# Patient Record
Sex: Male | Born: 1950 | Race: White | Hispanic: No | Marital: Married | Smoking: Former smoker
Health system: Southern US, Community
[De-identification: ages and names within clinical notes are randomized; demographics above are authoritative.]

## PROBLEM LIST (undated history)

## (undated) DIAGNOSIS — J449 Chronic obstructive pulmonary disease, unspecified: Secondary | ICD-10-CM

## (undated) DIAGNOSIS — R042 Hemoptysis: Secondary | ICD-10-CM

## (undated) DIAGNOSIS — T4145XA Adverse effect of unspecified anesthetic, initial encounter: Secondary | ICD-10-CM

## (undated) DIAGNOSIS — I639 Cerebral infarction, unspecified: Secondary | ICD-10-CM

## (undated) DIAGNOSIS — H53459 Other localized visual field defect, unspecified eye: Secondary | ICD-10-CM

## (undated) DIAGNOSIS — F419 Anxiety disorder, unspecified: Secondary | ICD-10-CM

## (undated) DIAGNOSIS — D649 Anemia, unspecified: Secondary | ICD-10-CM

## (undated) DIAGNOSIS — F32A Depression, unspecified: Secondary | ICD-10-CM

## (undated) DIAGNOSIS — I119 Hypertensive heart disease without heart failure: Secondary | ICD-10-CM

## (undated) DIAGNOSIS — H409 Unspecified glaucoma: Secondary | ICD-10-CM

## (undated) DIAGNOSIS — F0390 Unspecified dementia without behavioral disturbance: Secondary | ICD-10-CM

## (undated) DIAGNOSIS — F329 Major depressive disorder, single episode, unspecified: Secondary | ICD-10-CM

## (undated) DIAGNOSIS — I340 Nonrheumatic mitral (valve) insufficiency: Secondary | ICD-10-CM

## (undated) DIAGNOSIS — J189 Pneumonia, unspecified organism: Secondary | ICD-10-CM

## (undated) DIAGNOSIS — R160 Hepatomegaly, not elsewhere classified: Secondary | ICD-10-CM

## (undated) DIAGNOSIS — T8859XA Other complications of anesthesia, initial encounter: Secondary | ICD-10-CM

## (undated) DIAGNOSIS — H33312 Horseshoe tear of retina without detachment, left eye: Secondary | ICD-10-CM

## (undated) DIAGNOSIS — I4892 Unspecified atrial flutter: Secondary | ICD-10-CM

## (undated) DIAGNOSIS — M1990 Unspecified osteoarthritis, unspecified site: Secondary | ICD-10-CM

## (undated) DIAGNOSIS — G4730 Sleep apnea, unspecified: Secondary | ICD-10-CM

## (undated) DIAGNOSIS — K219 Gastro-esophageal reflux disease without esophagitis: Secondary | ICD-10-CM

## (undated) HISTORY — DX: Hypertensive heart disease without heart failure: I11.9

## (undated) HISTORY — PX: LEG SURGERY: SHX1003

## (undated) HISTORY — DX: Hemoptysis: R04.2

## (undated) HISTORY — PX: TONSILLECTOMY: SUR1361

## (undated) HISTORY — DX: Nonrheumatic mitral (valve) insufficiency: I34.0

## (undated) HISTORY — DX: Unspecified atrial flutter: I48.92

## (undated) HISTORY — DX: Unspecified osteoarthritis, unspecified site: M19.90

## (undated) HISTORY — PX: VASECTOMY: SHX75

## (undated) HISTORY — PX: COLONOSCOPY: SHX174

---

## 2001-10-14 HISTORY — PX: CARDIAC CATHETERIZATION: SHX172

## 2001-11-13 ENCOUNTER — Ambulatory Visit (HOSPITAL_COMMUNITY): Admission: RE | Admit: 2001-11-13 | Discharge: 2001-11-13 | Payer: Self-pay | Admitting: Cardiology

## 2003-03-08 ENCOUNTER — Encounter: Admission: RE | Admit: 2003-03-08 | Discharge: 2003-03-31 | Payer: Self-pay | Admitting: Family Medicine

## 2003-03-15 ENCOUNTER — Encounter: Admission: RE | Admit: 2003-03-15 | Discharge: 2003-03-31 | Payer: Self-pay | Admitting: Family Medicine

## 2004-12-05 ENCOUNTER — Encounter (INDEPENDENT_AMBULATORY_CARE_PROVIDER_SITE_OTHER): Payer: Self-pay | Admitting: Specialist

## 2004-12-05 ENCOUNTER — Ambulatory Visit (HOSPITAL_COMMUNITY): Admission: RE | Admit: 2004-12-05 | Discharge: 2004-12-05 | Payer: Self-pay | Admitting: Gastroenterology

## 2007-05-18 ENCOUNTER — Ambulatory Visit (HOSPITAL_COMMUNITY): Admission: RE | Admit: 2007-05-18 | Discharge: 2007-05-18 | Payer: Self-pay | Admitting: Ophthalmology

## 2007-10-15 HISTORY — PX: EYE SURGERY: SHX253

## 2007-11-14 ENCOUNTER — Emergency Department (HOSPITAL_COMMUNITY): Admission: EM | Admit: 2007-11-14 | Discharge: 2007-11-14 | Payer: Self-pay | Admitting: Emergency Medicine

## 2007-11-16 ENCOUNTER — Ambulatory Visit (HOSPITAL_COMMUNITY): Admission: RE | Admit: 2007-11-16 | Discharge: 2007-11-16 | Payer: Self-pay | Admitting: Ophthalmology

## 2010-05-26 ENCOUNTER — Observation Stay (HOSPITAL_COMMUNITY): Admission: EM | Admit: 2010-05-26 | Discharge: 2010-05-27 | Payer: Self-pay | Admitting: Emergency Medicine

## 2010-06-29 ENCOUNTER — Ambulatory Visit (HOSPITAL_COMMUNITY): Admission: RE | Admit: 2010-06-29 | Discharge: 2010-06-30 | Payer: Self-pay | Admitting: Neurosurgery

## 2010-06-29 HISTORY — PX: NECK SURGERY: SHX720

## 2010-12-27 LAB — CBC
HCT: 40.9 % (ref 39.0–52.0)
MCH: 30.6 pg (ref 26.0–34.0)
RBC: 4.67 MIL/uL (ref 4.22–5.81)

## 2010-12-27 LAB — BASIC METABOLIC PANEL
BUN: 15 mg/dL (ref 6–23)
Calcium: 9.2 mg/dL (ref 8.4–10.5)
Creatinine, Ser: 0.9 mg/dL (ref 0.4–1.5)
GFR calc Af Amer: >60 mL/min (ref >60)
GFR calc non Af Amer: >60 mL/min (ref >60)
Glucose, Bld: 107 mg/dL — ABNORMAL HIGH (ref 70–99)
Potassium: 4.3 mEq/L (ref 3.5–5.1)
Sodium: 138 mEq/L (ref 135–145)

## 2010-12-28 LAB — POCT CARDIAC MARKERS: Troponin i, poc: <0.05 ng/mL (ref 0.00–0.09)

## 2010-12-28 LAB — DIFFERENTIAL
Basophils Absolute: 0 10*3/uL (ref 0.0–0.1)
Basophils Relative: 1 % (ref 0–1)
Lymphocytes Relative: 35 % (ref 12–46)
Neutrophils Relative %: 47 % (ref 43–77)

## 2010-12-28 LAB — CARDIAC PANEL(CRET KIN+CKTOT+MB+TROPI)
CK, MB: 1.3 ng/mL (ref 0.3–4.0)
CK, MB: 1.6 ng/mL (ref 0.3–4.0)
Relative Index: 0.9 (ref 0.0–2.5)
Total CK: 123 U/L (ref 7–232)
Total CK: 152 U/L (ref 7–232)
Total CK: 165 U/L (ref 7–232)
Troponin I: 0.01 ng/mL (ref 0.00–0.06)

## 2010-12-28 LAB — URINALYSIS, ROUTINE W REFLEX MICROSCOPIC
Bilirubin Urine: NEGATIVE
Hgb urine dipstick: NEGATIVE
Ketones, ur: NEGATIVE mg/dL
Nitrite: NEGATIVE
Protein, ur: NEGATIVE mg/dL
Specific Gravity, Urine: 1.003 — ABNORMAL LOW (ref 1.005–1.030)
Urobilinogen, UA: 0.2 mg/dL (ref 0.0–1.0)
pH: 6.5 (ref 5.0–8.0)

## 2010-12-28 LAB — LIPID PANEL
Total CHOL/HDL Ratio: 5.1 RATIO
VLDL: 41 mg/dL — ABNORMAL HIGH (ref 0–40)

## 2010-12-28 LAB — CBC
HCT: 40.6 % (ref 39.0–52.0)
MCV: 88.8 fL (ref 78.0–100.0)
Platelets: 190 10*3/uL (ref 150–400)
RBC: 4.57 MIL/uL (ref 4.22–5.81)
WBC: 7.2 10*3/uL (ref 4.0–10.5)

## 2010-12-28 LAB — CK TOTAL AND CKMB (NOT AT ARMC)
CK, MB: 2 ng/mL (ref 0.3–4.0)
Relative Index: 1 (ref 0.0–2.5)

## 2010-12-28 LAB — BASIC METABOLIC PANEL
GFR calc Af Amer: >60 mL/min (ref >60)
GFR calc non Af Amer: >60 mL/min (ref >60)
Glucose, Bld: 105 mg/dL — ABNORMAL HIGH (ref 70–99)
Potassium: 4.1 mEq/L (ref 3.5–5.1)

## 2010-12-28 LAB — TROPONIN I: Troponin I: 0.02 ng/mL (ref 0.00–0.06)

## 2011-02-26 NOTE — Op Note (Signed)
Juan Johnson, Juan Johnson                ACCOUNT NO.:  [id number]   MEDICAL RECORD NO.:  [id number]          PATIENT TYPE:  INP   LOCATION:  1826                         FACILITY:  [facility]   PHYSICIAN:  Alford Highland. Rankin, M.D.   DATE OF BIRTH:  08-07-1951   DATE OF PROCEDURE:  11/14/2007  DATE OF DISCHARGE:  11/14/2007                               OPERATIVE REPORT   PREOPERATIVE DIAGNOSES:  1. 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  2. Hypopyon, left eye.  3. History of cataract extraction with intraocular lens placement      August 2008 with postop longstanding recurrent chronic      inflammation, left eye.   POSTOPERATIVE DIAGNOSES:  1. 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  2. Hypopyon, left eye.  3. History of cataract extraction with intraocular lens placement      August 2008 with postop longstanding recurrent chronic      inflammation, left eye.   PROCEDURE:  1. Posterior vitrectomy left eye with membrane peel of vitreous      strands and inflammatory fibrous debris.  2. Injection of intravitreal antibiotic, ceftazidime 2.25 mg/0.1 mL      and vancomycin 1 mg/0.1 mL, volume of each is 0.1 mL.  3. Anterior chamber tap - diagnostic left eye.   SURGEON:  Alford Highland. Rankin, M.D.   ANESTHESIA:  General endotracheal anesthesia.   PROCEDURE IN DETAIL:  The patient is a 60 year old man who proceeded  today with a profound vision loss, level of light perception and found  to have hypopyon, severe pain, injected left eye, cloudy vision of rapid  onset.  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  The patient  understands the need for surgical intervention as well as to deliver  therapeutic definitive antibiotics.  He understands the risks of  anesthesia including rare occurrence of death, loss of the eye,  including but not limited to hemorrhage, infection, scarring, need for  further surgery, no change in vision, loss of  vision, progressive  disease despite intervention.  He understands that the prognosis is  based upon the type of infection if it is found to be present.  He  understands the risks of anesthesia including rare occurrence of death.  Appropriate signed consent was obtained.   The patient was taken to the operating room.  In the operating room  appropriate monitors followed by mild sedation.  General endotracheal  anesthesia was administered without difficulty.  Left periocular region  was sterilely prepped and draped in the usual sterile fashion.  Lid  speculum applied.  A 30 gauge needle with a 1 mL syringe was then used  to diagnostically tap the anterior chamber and the hypopyon.  This was  plated directly to chocolate, blood agar and sheep blood agar plates as  well as to anaerobic culture media.   At this time a Lewicky anterior chamber maintainer was then placed in  the anterior chamber via paracentesis incision so as to keep the  anterior chamber as clear as possible.  At this time superior trocars  for 25 gauge  vitrectomy was then applied.  Core vitrectomy was then  begun.  Notable findings were significant inflammatory vitreous debris  appearing like endophthalmitis.  The optic nerve did appear perfuse.  Retinal vasculature showed no dramatic signs of closure.  There were  scattered intraretinal hemorrhages in the mid periphery.   At this time peripheral vitreous was trimmed and shaped.  Care was taken  to avoid it getting even close to the retina.  The media cleared nicely.  At this time the superior trocar was removed under low infusion  pressures to allow room for the intraocular antibiotics.  The anterior  chamber maintainer was removed.  Then 0.1 mL of each of the antibiotics  mentioned above were then injected via the pars plana.  No complications  occurred.   At this time subconjunctival remnants of the antibiotics was applied.  Sterile patch and Fox shield applied.  The  patient was awakened from  anesthesia in good and stable condition.  He was taken to the PACU in  good and stable condition.      Alford Highland Rankin, M.D.  Electronically Signed     GAR/MEDQ  D:  11/14/2007  T:  11/15/2007  Job:  [id number]   cc:   Earnest Bailey, Dr

## 2011-02-26 NOTE — Op Note (Signed)
NAMERHYLAND, HINDERLITER                ACCOUNT NO.:  [id number]   MEDICAL RECORD NO.:  [id number]          PATIENT TYPE:  AMB   LOCATION:  SDS                          FACILITY:  [facility]   PHYSICIAN:  Alford Highland. Rankin, M.D.   DATE OF BIRTH:  1951/07/09   DATE OF PROCEDURE:  11/16/2007  DATE OF DISCHARGE:  11/16/2007                               OPERATIVE REPORT   PREOPERATIVE DIAGNOSES:  1. Endophthalmitis, suspected, persistent or recurrent, left eye.  2. Status post recent vitrectomy and instillation of intravitreal      antibiotics on November 14, 2007.   POSTOPERATIVE DIAGNOSES:  1. Endophthalmitis, suspected, persistent or recurrent, left eye.  2. Status post recent vitrectomy and instillation of intravitreal      antibiotics on November 14, 2007.   PROCEDURES:  1. Posterior vitrectomy, 25 gauge, left eye.  2. Removal of pupillary fibrin membrane with posterior synechialysis,      left eye.  3. Injection of intravitreal antibiotics, vancomycin, concentration 1      mg/0.1 mL, volume 0.1 mL, and ceftazidime 2.25 mg/0.1 mL, volume      0.1 mL, injected into the vitreous cavity, left eye.   SURGEON:  Alford Highland. Rankin, M.D.   ANESTHESIA:  Local retrobulbar with monitored anesthesia control.   INDICATIONS FOR PROCEDURE:  The patient is a 60 year old man who has  profound, persistent vision loss and reaccumulation of hypopyon and  pupillary fibrin membrane with significant anterior chamber debris,  persistent poor vision in the left eye on the basis of a recent  explosion of inflammation believed to be endophthalmitis, which has  worsened now to purulent endophthalmitis of the left eye.  This an  attempt to clean the vitreous cavity as well as to deliver therapeutic  intraocular antibiotics therapeutically.  The patient understands this  is an attempt to treat the inflammation and suspected endophthalmitis.  He understands this is an attempt to treat aggressively so as to  preserve the  best visual functioning possible.  He understands the risks  of anesthesia including the rare occurrence of death and losses to the  eye, including but not limited to hemorrhage, infection, scarring, need  for further surgery, no change in vision, loss of vision, and  progressive disease despite intervention.  Appropriate signed consent  was obtained.   The patient was taken to the operating room.  In the operating room  appropriate monitoring was followed by mild sedation.  Xylocaine 2% was  then injected retrobulbar for 5 mL with additional 5 mL laterally in the  fashion of modified Darel Hong.  The left periocular region was sterilely  prepped and draped in the usual ophthalmic fashion.  A lid speculum  applied.  A 25-gauge trocar placed in the vitreous cavity.  Superior  trocar was applied.  Initially a trocar was placed in posterior chamber  so as to remove the pupillary fibrin membrane.  Combination of  vitrectomy and forceps was then used to mobilize and remove the  membrane.   Anterior chamber paracentesis had been done initially.  This was plated  directly on the  plates for culture purposes.  Now the anterior chamber  trocar was removed and now the superior trocar was placed in the  vitreous cavity via the pars plana.  Core vitrectomy was begun again.  Excellent visualization was finally obtained and peripheral vitreous  skirt was trimmed even further than previously.  Excellent visualization  of the macula and the retinal vasculature and optic nerve were obtained,  and these appeared to be normal in the posterior segment.   At this time of the superior trocars were removed.  The infusion removed  under low infusion pressures.  This allowed placement of intravitreal  antibiotics, 0.1 mL of each of the vancomycin and ceftazidime.  Remnants  of these were placed subconjunctivally.  Subconjunctival Decadron  applied.  A sterile patch and a Fox shield applied.  The patient   tolerated the procedure without complication.      Alford Highland Rankin, M.D.  Electronically Signed     GAR/MEDQ  D:  11/16/2007  T:  11/17/2007  Job:  [id number]

## 2011-03-01 NOTE — Cardiovascular Report (Signed)
[place]  Patient:    Juan Johnson, Juan Johnson Visit Number: [id number] MRN: [id number]          Service Type: CAT Location: [facility] 2899 10 Attending Physician:  Norman Clay Dictated by:   Darden Palmer., M.D. Proc. Date: 11/13/01 Admit Date:  11/13/2001 Discharge Date: 11/13/2001   CC:         Delorse Lek, M.D.   Cardiac Catheterization  HISTORY OF PRESENT ILLNESS:  A 60 year old male who has had progressive exertional dyspnea and chest tightness consistent with angina.  Had a Cardiolite scan done earlier this year that was negative, but despite having the Cardiolite scan, he has had progressive symptoms of shortness of breath, pressure, and tightness with exertion.  COMPLICATIONS:  The patient tolerated the procedure well without complications.  Following the procedure, he had good hemostasis and peripheral pulses noted.  Catheter-induced spasm was present in the right coronary artery which was reversed with two doses of intracoronary nitroglycerin.  RESULTS:  Hemodynamic data: 1. Aorta post contrast 105/65. 2. LV post contrast 105/10.  Angiographic data: 1. Left ventriculogram performed in the 30 degree RAO projection:  The aortic    valve was normal, the mitral valve was normal, the left ventricle appears    normal in size.  The estimated ejection fraction was 60-65%.  Wall motion    is normal. 2. Coronary arteries rise and distribute normally.  No significant coronary    calcification is noted. 3. The left main coronary artery appears normal. 4. The left anterior descending has a large diagonal branch, is mildly    tortuous and contains only scattered irregularities. 5. The circumflex coronary artery has a series of three marginal branches.  It    is moderately tortuous but has no significant stenoses noted. 6. The right coronary artery is a dominant vessel.  There was no significant    focal obstructive  stenoses noted.  Catheter-induced spasm was present after    the initial injection which reversed with the two doses of intracoronary    nitroglycerin.  IMPRESSION: 1. Normal left ventricular function. 2. No significant obstructive coronary artery disease identified, minimally    irregularities were seen involving the left anterior descending coronary    artery.  RECOMMENDATIONS:  Continue medical treatment.  Treatment for small vessel disease and hypertension. Dictated by:   Darden Palmer., M.D. Attending Physician:  Norman Clay DD:  11/13/01 TD:  11/13/01 Job: [id number] [id number]

## 2011-03-01 NOTE — Op Note (Signed)
Juan Johnson, Juan Johnson                ACCOUNT NO.:  [id number]   MEDICAL RECORD NO.:  [id number]          PATIENT TYPE:  AMB   LOCATION:  ENDO                         FACILITY:  [facility]   PHYSICIAN:  Anselmo Rod, M.D.  DATE OF BIRTH:  May 29, 1951   DATE OF PROCEDURE:  12/05/2004  DATE OF DISCHARGE:                                 OPERATIVE REPORT   PROCEDURE PERFORMED:  Colonoscopy with snare polypectomy x1.   ENDOSCOPIST:  Anselmo Rod, M.D.   INSTRUMENT USED:  Olympus video colonoscope.   INDICATION FOR PROCEDURE:  [age]-year-old white male with a history of  occasional rectal bleeding, undergoing a screening colonoscopy to rule out  colon polyps, masses, etc.   PREPROCEDURE PREPARATION:  Informed consent was procured from the patient.  The patient was fasted for 8 hours prior to the procedure and prepped with a  bottle of magnesium citrate and a gallon of GoLYTELY the night prior to the  procedure.  A 10% missed rate of cancer in polyps was discussed with the  patient as well.   PREPROCEDURE PHYSICAL:  VITAL SIGNS:  The patient had stable vital signs.  NECK:  Neck supple.  CHEST:  Chest clear to auscultation.  S1 and S2 regular.  ABDOMEN:  Abdomen soft with normal bowel sounds.   DESCRIPTION OF THE PROCEDURE:  The patient was placed in the left lateral  decubitus position and sedated with 75 mg of Demerol and 5 mg of Versed in  slow incremental doses.  Once the patient was adequately sedated and  maintained on low-flow oxygen and continuous cardiac monitoring, the Olympus  video colonoscope was advanced from the rectum to the cecum.  The  appendiceal orifice and the ileocecal valve were visualized.  There was a  large amount of residual stool in the right colon and multiple washes were  done.  A small sessile polyp was noted in the proximal right colon and  another small sessile polyp was ablated in the mid right colon.  There were  a few sigmoid diverticula present.   Retroflexion in the rectum revealed  small internal hemorrhoids.  The patient tolerated the procedure well  without any immediate complications.   IMPRESSION:  1.  Small sessile polyp snared from proximal right colon.  2.  Small polyp ablated from mid right colon with the tip of the snare      polypectomy forceps.  3.  A few early sigmoid diverticula.  4.  Small internal hemorrhoids.   RECOMMENDATIONS:  1.  Await pathology results.  2.  Avoid nonsteroidals including aspirin for the next 2 weeks.  3.  High-fiber diet with liberal fluid intake.  4.  Outpatient followup as need arises in the future.  5.  A repeat colonoscopy will be done depending on pathology results.      JNM/MEDQ  D:  12/05/2004  T:  12/05/2004  Job:  [id number]   cc:   Marjory Lies, M.D.  [id number]  [place]  [place] [address]  Fax: [phone]

## 2011-07-04 LAB — ANAEROBIC CULTURE

## 2011-07-04 LAB — BASIC METABOLIC PANEL: GFR calc Af Amer: >60

## 2011-07-04 LAB — GRAM STAIN

## 2011-07-04 LAB — DIFFERENTIAL
Basophils Absolute: 0.1
Eosinophils Relative: 3
Monocytes Absolute: 0.7

## 2011-07-04 LAB — EYE CULTURE: Culture: NO GROWTH

## 2011-07-04 LAB — CBC
HCT: 41.2
MCV: 89.7
Platelets: 299
RBC: 4.59
RDW: 12.5

## 2011-07-05 LAB — BASIC METABOLIC PANEL
Calcium: 9.5
Creatinine, Ser: 0.97
GFR calc Af Amer: >60
GFR calc non Af Amer: >60
Sodium: 139

## 2011-07-05 LAB — CBC
Hemoglobin: 14.2
MCHC: 34.3
RBC: 4.57
WBC: 9

## 2011-07-05 LAB — LIPID PANEL
Cholesterol: 220 — ABNORMAL HIGH
HDL: 34 — ABNORMAL LOW
LDL Cholesterol: 153 — ABNORMAL HIGH
Total CHOL/HDL Ratio: 6.5
Triglycerides: 163 — ABNORMAL HIGH

## 2011-07-05 LAB — EYE CULTURE
Culture: NO GROWTH
Culture: NO GROWTH

## 2011-07-29 LAB — BASIC METABOLIC PANEL
Calcium: 9.4
GFR calc non Af Amer: >60
Glucose, Bld: 129 — ABNORMAL HIGH
Sodium: 138

## 2011-07-29 LAB — HEMOGLOBIN AND HEMATOCRIT, BLOOD: Hemoglobin: 14.3

## 2011-09-24 HISTORY — PX: SHOULDER SURGERY: SHX246

## 2012-10-05 ENCOUNTER — Other Ambulatory Visit: Payer: Self-pay | Admitting: Neurosurgery

## 2012-10-05 DIAGNOSIS — M549 Dorsalgia, unspecified: Secondary | ICD-10-CM

## 2012-10-05 DIAGNOSIS — M5410 Radiculopathy, site unspecified: Secondary | ICD-10-CM

## 2012-10-12 ENCOUNTER — Inpatient Hospital Stay
Admission: RE | Admit: 2012-10-12 | Discharge: 2012-10-12 | Disposition: A | Discharge status: Home / Self Care | Payer: Self-pay | Source: Ambulatory Visit | Attending: Neurosurgery | Admitting: Neurosurgery

## 2012-10-12 ENCOUNTER — Other Ambulatory Visit: Payer: Self-pay | Admitting: Neurosurgery

## 2012-10-12 ENCOUNTER — Ambulatory Visit
Admission: RE | Admit: 2012-10-12 | Discharge: 2012-10-12 | Disposition: A | Discharge status: Home / Self Care | Payer: BC Managed Care – PPO | Source: Ambulatory Visit | Attending: Neurosurgery | Admitting: Neurosurgery

## 2012-10-12 VITALS — BP 109/61 | HR 58 | Ht 69.0 in | Wt 215.0 lb

## 2012-10-12 DIAGNOSIS — M549 Dorsalgia, unspecified: Secondary | ICD-10-CM

## 2012-10-12 DIAGNOSIS — M5410 Radiculopathy, site unspecified: Secondary | ICD-10-CM

## 2012-10-12 MED ORDER — DIAZEPAM 5 MG PO TABS
10.0000 mg | ORAL_TABLET | Freq: Once | ORAL | Status: AC
  Administered 2012-10-12: 10 mg via ORAL

## 2012-10-12 MED ORDER — IOHEXOL 180 MG/ML  SOLN
18.0000 mL | Freq: Once | INTRAMUSCULAR | Status: AC | PRN
  Administered 2012-10-12: 18 mL via INTRATHECAL

## 2012-10-12 MED ORDER — ONDANSETRON HCL 4 MG/2ML IJ SOLN: 4.0000 mg | Freq: Four times a day (QID) | INTRAMUSCULAR | Status: DC | PRN

## 2014-01-05 ENCOUNTER — Other Ambulatory Visit (HOSPITAL_COMMUNITY): Payer: Self-pay | Admitting: Family Medicine

## 2014-01-05 ENCOUNTER — Ambulatory Visit (HOSPITAL_COMMUNITY)
Admission: RE | Admit: 2014-01-05 | Discharge: 2014-01-05 | Disposition: A | Discharge status: Home / Self Care | Payer: BC Managed Care – PPO | Source: Ambulatory Visit | Attending: Vascular Surgery | Admitting: Vascular Surgery

## 2014-01-05 DIAGNOSIS — M79609 Pain in unspecified limb: Secondary | ICD-10-CM

## 2014-01-19 DIAGNOSIS — IMO0002 Reserved for concepts with insufficient information to code with codable children: Secondary | ICD-10-CM | POA: Diagnosis not present

## 2014-01-19 DIAGNOSIS — M5137 Other intervertebral disc degeneration, lumbosacral region: Secondary | ICD-10-CM | POA: Diagnosis not present

## 2014-02-08 DIAGNOSIS — IMO0002 Reserved for concepts with insufficient information to code with codable children: Secondary | ICD-10-CM | POA: Diagnosis not present

## 2014-02-08 DIAGNOSIS — M5137 Other intervertebral disc degeneration, lumbosacral region: Secondary | ICD-10-CM | POA: Diagnosis not present

## 2014-02-14 DIAGNOSIS — H4011X0 Primary open-angle glaucoma, stage unspecified: Secondary | ICD-10-CM | POA: Diagnosis not present

## 2014-02-14 DIAGNOSIS — H524 Presbyopia: Secondary | ICD-10-CM | POA: Diagnosis not present

## 2014-02-14 DIAGNOSIS — H35379 Puckering of macula, unspecified eye: Secondary | ICD-10-CM | POA: Diagnosis not present

## 2014-02-28 DIAGNOSIS — M5137 Other intervertebral disc degeneration, lumbosacral region: Secondary | ICD-10-CM | POA: Diagnosis not present

## 2014-02-28 DIAGNOSIS — Z6834 Body mass index (BMI) 34.0-34.9, adult: Secondary | ICD-10-CM | POA: Diagnosis not present

## 2014-03-02 ENCOUNTER — Other Ambulatory Visit: Payer: Self-pay | Admitting: Neurosurgery

## 2014-03-02 DIAGNOSIS — M5136 Other intervertebral disc degeneration, lumbar region: Secondary | ICD-10-CM

## 2014-03-14 ENCOUNTER — Ambulatory Visit
Admission: RE | Admit: 2014-03-14 | Discharge: 2014-03-14 | Disposition: A | Discharge status: Home / Self Care | Payer: Medicare Other | Source: Ambulatory Visit | Attending: Neurosurgery | Admitting: Neurosurgery

## 2014-03-14 VITALS — BP 99/56 | HR 71

## 2014-03-14 DIAGNOSIS — M51369 Other intervertebral disc degeneration, lumbar region without mention of lumbar back pain or lower extremity pain: Secondary | ICD-10-CM

## 2014-03-14 DIAGNOSIS — M5136 Other intervertebral disc degeneration, lumbar region: Secondary | ICD-10-CM

## 2014-03-14 DIAGNOSIS — M48061 Spinal stenosis, lumbar region without neurogenic claudication: Secondary | ICD-10-CM | POA: Diagnosis not present

## 2014-03-14 DIAGNOSIS — M5126 Other intervertebral disc displacement, lumbar region: Secondary | ICD-10-CM | POA: Diagnosis not present

## 2014-03-14 DIAGNOSIS — M47817 Spondylosis without myelopathy or radiculopathy, lumbosacral region: Secondary | ICD-10-CM | POA: Diagnosis not present

## 2014-03-14 MED ORDER — IOHEXOL 180 MG/ML  SOLN
18.0000 mL | Freq: Once | INTRAMUSCULAR | Status: AC | PRN
  Administered 2014-03-14: 18 mL via INTRATHECAL

## 2014-03-14 MED ORDER — DIAZEPAM 5 MG PO TABS
5.0000 mg | ORAL_TABLET | Freq: Once | ORAL | Status: AC
  Administered 2014-03-14: 5 mg via ORAL

## 2014-03-14 NOTE — Discharge Instructions (Signed)
Myelogram Discharge Instructions  1. Go home and rest quietly for the next 24 hours.  It is important to lie flat for the next 24 hours.  Get up only to go to the restroom.  You may lie in the bed or on a couch on your back, your stomach, your left side or your right side.  You may have one pillow under your head.  You may have pillows between your knees while you are on your side or under your knees while you are on your back.  2. DO NOT drive today.  Recline the seat as far back as it will go, while still wearing your seat belt, on the way home.  3. You may get up to go to the bathroom as needed.  You may sit up for 10 minutes to eat.  You may resume your normal diet and medications unless otherwise indicated.  Drink lots of extra fluids today and tomorrow.  4. The incidence of headache, nausea, or vomiting is about 5% (one in 20 patients).  If you develop a headache, lie flat and drink plenty of fluids until the headache goes away.  Caffeinated beverages may be helpful.  If you develop severe nausea and vomiting or a headache that does not go away with flat bed rest, call [phone].  5. You may resume normal activities after your 24 hours of bed rest is over; however, do not exert yourself strongly or do any heavy lifting tomorrow. If when you get up you have a headache when standing, go back to bed and force fluids for another 24 hours.  6. Call your physician for a follow-up appointment.  The results of your myelogram will be sent directly to your physician by the following day.  7. If you have any questions or if complications develop after you arrive home, please call [phone].  Discharge instructions have been explained to the patient.  The patient, or the person responsible for the patient, fully understands these instructions.      May resume Nortriptyline on March 15, 2014, after 11:00 am.

## 2014-03-14 NOTE — Progress Notes (Signed)
Patient states he has been off Nortriptyline for at least the past two days.  jkl

## 2014-03-15 DIAGNOSIS — H2010 Chronic iridocyclitis, unspecified eye: Secondary | ICD-10-CM | POA: Diagnosis not present

## 2014-03-15 DIAGNOSIS — H35379 Puckering of macula, unspecified eye: Secondary | ICD-10-CM | POA: Diagnosis not present

## 2014-03-15 DIAGNOSIS — H4011X0 Primary open-angle glaucoma, stage unspecified: Secondary | ICD-10-CM | POA: Diagnosis not present

## 2014-03-15 DIAGNOSIS — H35369 Drusen (degenerative) of macula, unspecified eye: Secondary | ICD-10-CM | POA: Diagnosis not present

## 2014-03-22 DIAGNOSIS — Z6833 Body mass index (BMI) 33.0-33.9, adult: Secondary | ICD-10-CM | POA: Diagnosis not present

## 2014-03-22 DIAGNOSIS — M5137 Other intervertebral disc degeneration, lumbosacral region: Secondary | ICD-10-CM | POA: Diagnosis not present

## 2014-04-11 DIAGNOSIS — M5137 Other intervertebral disc degeneration, lumbosacral region: Secondary | ICD-10-CM | POA: Diagnosis not present

## 2014-04-11 DIAGNOSIS — IMO0002 Reserved for concepts with insufficient information to code with codable children: Secondary | ICD-10-CM | POA: Diagnosis not present

## 2014-06-07 DIAGNOSIS — M5137 Other intervertebral disc degeneration, lumbosacral region: Secondary | ICD-10-CM | POA: Diagnosis not present

## 2014-06-07 DIAGNOSIS — IMO0002 Reserved for concepts with insufficient information to code with codable children: Secondary | ICD-10-CM | POA: Diagnosis not present

## 2014-06-15 DIAGNOSIS — H35379 Puckering of macula, unspecified eye: Secondary | ICD-10-CM | POA: Diagnosis not present

## 2014-06-15 DIAGNOSIS — H47639 Disorders of visual cortex in (due to) neoplasm, unspecified side of brain: Secondary | ICD-10-CM | POA: Diagnosis not present

## 2014-06-15 DIAGNOSIS — H35369 Drusen (degenerative) of macula, unspecified eye: Secondary | ICD-10-CM | POA: Diagnosis not present

## 2014-06-21 DIAGNOSIS — H47649 Disorders of visual cortex in (due to) vascular disorders, unspecified side of brain: Secondary | ICD-10-CM | POA: Diagnosis not present

## 2014-06-23 ENCOUNTER — Encounter: Payer: Self-pay | Admitting: *Deleted

## 2014-06-24 ENCOUNTER — Encounter: Payer: Self-pay | Admitting: Neurology

## 2014-06-24 ENCOUNTER — Ambulatory Visit (INDEPENDENT_AMBULATORY_CARE_PROVIDER_SITE_OTHER): Payer: Medicare Other | Admitting: Neurology

## 2014-06-24 VITALS — Ht 71.0 in | Wt 225.8 lb

## 2014-06-24 DIAGNOSIS — H543 Unqualified visual loss, both eyes: Secondary | ICD-10-CM | POA: Diagnosis not present

## 2014-06-24 DIAGNOSIS — H532 Diplopia: Secondary | ICD-10-CM | POA: Diagnosis not present

## 2014-06-24 DIAGNOSIS — IMO0002 Reserved for concepts with insufficient information to code with codable children: Secondary | ICD-10-CM

## 2014-06-24 DIAGNOSIS — G609 Hereditary and idiopathic neuropathy, unspecified: Secondary | ICD-10-CM

## 2014-06-24 DIAGNOSIS — R259 Unspecified abnormal involuntary movements: Secondary | ICD-10-CM

## 2014-06-24 DIAGNOSIS — R413 Other amnesia: Secondary | ICD-10-CM | POA: Diagnosis not present

## 2014-06-24 DIAGNOSIS — R5383 Other fatigue: Secondary | ICD-10-CM

## 2014-06-24 DIAGNOSIS — R5381 Other malaise: Secondary | ICD-10-CM | POA: Diagnosis not present

## 2014-06-24 DIAGNOSIS — R251 Tremor, unspecified: Secondary | ICD-10-CM

## 2014-06-24 DIAGNOSIS — R269 Unspecified abnormalities of gait and mobility: Secondary | ICD-10-CM | POA: Diagnosis not present

## 2014-06-24 DIAGNOSIS — I6350 Cerebral infarction due to unspecified occlusion or stenosis of unspecified cerebral artery: Secondary | ICD-10-CM

## 2014-06-24 DIAGNOSIS — R531 Weakness: Secondary | ICD-10-CM

## 2014-06-24 MED ORDER — ALPRAZOLAM 2 MG PO TABS: 2.0000 mg | ORAL_TABLET | Freq: Every evening | ORAL | Status: DC | PRN

## 2014-06-24 NOTE — Patient Instructions (Signed)
Overall you are doing fairly well but I do want to suggest a few things today:   Remember to drink plenty of fluid, eat healthy meals and do not skip any meals. Try to eat protein with a every meal and eat a healthy snack such as fruit or nuts in between meals. Try to keep a regular sleep-wake schedule and try to exercise daily, particularly in the form of walking, 20-30 minutes a day, if you can.   As far as diagnostic testing: EMG/NCS, MRI of the brain, labwork today  I would like to see you back in 1 week for emg/ncs, sooner if we need to. Please call us with any interim questions, concerns, problems, updates or refill requests.   Please also call us for any test results so we can go over those with you on the phone.  My clinical assistant and will answer any of your questions and relay your messages to me and also relay most of my messages to you.   Our phone number is [phone]. We also have an after hours call service for urgent matters and there is a physician on-call for urgent questions. For any emergencies you know to call 911 or go to the nearest emergency room
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
## 2014-06-25 DIAGNOSIS — G609 Hereditary and idiopathic neuropathy, unspecified: Secondary | ICD-10-CM | POA: Insufficient documentation

## 2014-06-25 DIAGNOSIS — H543 Unqualified visual loss, both eyes: Secondary | ICD-10-CM | POA: Insufficient documentation

## 2014-06-25 DIAGNOSIS — R531 Weakness: Secondary | ICD-10-CM | POA: Insufficient documentation

## 2014-06-25 DIAGNOSIS — H532 Diplopia: Secondary | ICD-10-CM | POA: Insufficient documentation

## 2014-06-30 ENCOUNTER — Telehealth: Payer: Self-pay

## 2014-06-30 ENCOUNTER — Telehealth: Payer: Self-pay | Admitting: Neurology

## 2014-06-30 NOTE — Telephone Encounter (Signed)
Thank you :)

## 2014-06-30 NOTE — Telephone Encounter (Signed)
Please call patient and let him kno that remaining lab test was normal.

## 2014-06-30 NOTE — Progress Notes (Signed)
Called patient. Left vmail. //cl

## 2014-06-30 NOTE — Telephone Encounter (Signed)
Message copied by Milta Deiters on Thu Jun 30, 2014 11:14 AM ------      Message from: AHERN, ANTONIA B      Created: Wed Jun 29, 2014  7:14 PM       Please call patient and let him know that most of his labs have returned normal so far. We still are waiting on the results of one. However his HgA1c came back elevated at 6 (5.6 is the cutoff and 6.4 is diabetes). He does not have diabetes but he is at increased risk. Should watch what he eats and follow up with his primary care. We will call him when the last lab result comes back. thanks ------

## 2014-06-30 NOTE — Telephone Encounter (Signed)
Called patient. No answer. Left vmail.  

## 2014-06-30 NOTE — Telephone Encounter (Signed)
Juan Johnson returned call. I gave lab results. Juan Johnson wanted Dr. Jaynee Eagles to know that he will have is MRI on 07/08/14, and ok to call before with remaining lab results.

## 2014-06-30 NOTE — Telephone Encounter (Signed)
Would you please call patient and let him know that his last lab came back normal. Thank you.

## 2014-07-01 ENCOUNTER — Telehealth: Payer: Self-pay | Admitting: Neurology

## 2014-07-01 LAB — COMPREHENSIVE METABOLIC PANEL
A/G RATIO: 2 (ref 1.1–2.5)
ALK PHOS: 69 IU/L (ref 39–117)
ALT: 24 IU/L (ref 0–44)
AST: 25 IU/L (ref 0–40)
Albumin: 4.3 g/dL (ref 3.6–4.8)
BILIRUBIN TOTAL: 0.4 mg/dL (ref 0.0–1.2)
BUN / CREAT RATIO: 11 (ref 10–22)
BUN: 10 mg/dL (ref 8–27)
CHLORIDE: 97 mmol/L (ref 97–108)
CO2: 23 mmol/L (ref 18–29)
Calcium: 9.8 mg/dL (ref 8.6–10.2)
Creatinine, Ser: 0.95 mg/dL (ref 0.76–1.27)
GFR, EST AFRICAN AMERICAN: 97 mL/min/{1.73_m2} (ref >59)
GFR, EST NON AFRICAN AMERICAN: 84 mL/min/{1.73_m2} (ref >59)
GLUCOSE: 121 mg/dL — AB (ref 65–99)
Globulin, Total: 2.1 g/dL (ref 1.5–4.5)
Potassium: 3.7 mmol/L (ref 3.5–5.2)
Sodium: 139 mmol/L (ref 134–144)
TOTAL PROTEIN: 6.4 g/dL (ref 6.0–8.5)

## 2014-07-01 LAB — IFE AND PE, SERUM
ALPHA 1: 3.5 g/dL — AB (ref 0.1–0.4)
ALPHA2 GLOB SERPL ELPH-MCNC: 0.2 g/dL — AB (ref 0.4–1.2)
Albumin SerPl Elph-Mcnc: 0.2 g/dL — ABNORMAL LOW (ref 3.2–5.6)
Albumin/Glob SerPl: 0.1 — ABNORMAL LOW (ref 0.7–2.0)
B-Globulin SerPl Elph-Mcnc: 0.7 g/dL (ref 0.6–1.3)
Gamma Glob SerPl Elph-Mcnc: 1.1 g/dL (ref 0.5–1.6)
Globulin, Total: 6.2 g/dL — ABNORMAL HIGH (ref 2.0–4.5)
IGM (IMMUNOGLOBULIN M), SRM: 23 mg/dL — AB (ref 40–230)
IgA/Immunoglobulin A, Serum: 245 mg/dL (ref 91–414)
IgG (Immunoglobin G), Serum: 651 mg/dL — ABNORMAL LOW (ref 700–1600)

## 2014-07-01 LAB — HEMOGLOBIN A1C
Est. average glucose Bld gHb Est-mCnc: 126 mg/dL
Hgb A1c MFr Bld: 6 % — ABNORMAL HIGH (ref 4.8–5.6)

## 2014-07-01 LAB — LACTIC ACID, PLASMA: LACTATE: 14.4 mg/dL (ref 4.5–19.8)

## 2014-07-01 LAB — RHEUMATOID FACTOR: Rhuematoid fact SerPl-aCnc: 8.8 IU/mL (ref 0.0–13.9)

## 2014-07-01 LAB — ACETYLCHOLINE RECEPTOR, BLOCKING: ACHR BLOCKING ABS, SERUM: 18 % (ref 0–25)

## 2014-07-01 LAB — ACETYLCHOLINE RECEPTOR, BINDING: AChR Binding Ab, Serum: <0.03 nmol/L (ref 0.00–0.24)

## 2014-07-01 LAB — SEDIMENTATION RATE: Sed Rate: 2 mm/hr (ref 0–30)

## 2014-07-01 LAB — METHYLMALONIC ACID, SERUM: Methylmalonic Acid: 153 nmol/L (ref 0–378)

## 2014-07-01 LAB — HTLV-I/II ANTIBODIES, QUAL.: HTLV I/II AB: NEGATIVE

## 2014-07-01 LAB — LYME, TOTAL AB TEST/REFLEX: Lyme IgG/IgM Ab: <0.91 {ISR} (ref 0.00–0.90)

## 2014-07-01 LAB — ANGIOTENSIN CONVERTING ENZYME: Angio Convert Enzyme: <14 U/L — ABNORMAL LOW (ref 14–82)

## 2014-07-01 LAB — ACETYLCHOLINE RECEPTOR, MODULATING: Acetylcholine Rec Mod Ab: <12 % (ref 0–20)

## 2014-07-01 LAB — PARANEOPLASTIC PROFILE 1

## 2014-07-01 LAB — CK: Total CK: 171 U/L (ref 24–204)

## 2014-07-01 LAB — GAD-65 AUTOANTIBODY: Glutamic Acid Decarb Ab: <1 U/mL (ref 0.0–1.5)

## 2014-07-01 LAB — VGCC ANTIBODY: VGCC Antibody: NEGATIVE

## 2014-07-01 LAB — ANA W/REFLEX: Anti Nuclear Antibody(ANA): NEGATIVE

## 2014-07-01 LAB — VITAMIN B1, WHOLE BLOOD: THIAMINE: 177.4 nmol/L (ref 66.5–200.0)

## 2014-07-01 LAB — ALDOLASE: Aldolase: 4.6 U/L (ref 3.3–10.3)

## 2014-07-01 LAB — TSH: TSH: 1.37 u[IU]/mL (ref 0.450–4.500)

## 2014-07-01 NOTE — Telephone Encounter (Signed)
Dr. Jaynee Eagles, this is a result you should and talk to the patient about. Please advise.

## 2014-07-01 NOTE — Telephone Encounter (Signed)
Ok, if this something you all don't normally call patients about, I am happy to do it. I'm just learning.

## 2014-07-01 NOTE — Telephone Encounter (Signed)
Please call patient back and let him know his HgbA1c was slightly elevated. He is not diabetic but he is at increased risk. Should watch his carbs and sugar intake and weight. Follow up with his primary care physician. Thank you.

## 2014-07-01 NOTE — Telephone Encounter (Signed)
Called patient and left a detailed message about his mildly elevated HgBA1c. Said he can call back if he has further questions or we can discuss at next appointment. Thanks.

## 2014-07-04 NOTE — Telephone Encounter (Signed)
Noted  

## 2014-07-06 ENCOUNTER — Other Ambulatory Visit: Payer: Self-pay | Admitting: Neurology

## 2014-07-06 DIAGNOSIS — H532 Diplopia: Secondary | ICD-10-CM

## 2014-07-06 DIAGNOSIS — H543 Unqualified visual loss, both eyes: Secondary | ICD-10-CM

## 2014-07-08 ENCOUNTER — Ambulatory Visit
Admission: RE | Admit: 2014-07-08 | Discharge: 2014-07-08 | Disposition: A | Discharge status: Home / Self Care | Payer: Medicare Other | Source: Ambulatory Visit | Attending: Neurology | Admitting: Neurology

## 2014-07-08 DIAGNOSIS — H532 Diplopia: Secondary | ICD-10-CM

## 2014-07-08 DIAGNOSIS — H543 Unqualified visual loss, both eyes: Secondary | ICD-10-CM

## 2014-07-08 DIAGNOSIS — IMO0002 Reserved for concepts with insufficient information to code with codable children: Secondary | ICD-10-CM

## 2014-07-11 ENCOUNTER — Telehealth: Payer: Self-pay | Admitting: *Deleted

## 2014-07-11 ENCOUNTER — Encounter: Payer: BC Managed Care – PPO | Admitting: Neurology

## 2014-07-12 NOTE — Telephone Encounter (Signed)
Thank you Larey Seat! WOuld you call the patient and let him know what we are doing for him? Thank you

## 2014-07-12 NOTE — Telephone Encounter (Signed)
Renee - can you help with this? He needs an open scanner and wants to go to [facility] on [address].

## 2014-07-12 NOTE — Telephone Encounter (Signed)
Juan Johnson was given this message earlier. Will take care of it.

## 2014-07-13 NOTE — Telephone Encounter (Signed)
Spoke with [employer]. She has faxed info to [facility]. They will be calling patient to schedule in the next few days. Renee called the patient yesterday and made him aware.

## 2014-07-14 ENCOUNTER — Other Ambulatory Visit: Payer: Self-pay | Admitting: Neurology

## 2014-07-14 MED ORDER — DIAZEPAM 5 MG PO TABS: ORAL_TABLET | ORAL | Status: DC

## 2014-07-14 NOTE — Telephone Encounter (Signed)
Patient calling to request sedative to be called in to the [facility] in [place] before his MRI tomorrow, please return call and advise when this has been done.

## 2014-07-14 NOTE — Telephone Encounter (Signed)
Thank you! Will print and sign it and give it to cathy. Vivien Rota

## 2014-07-14 NOTE — Telephone Encounter (Signed)
I called the patient and advised his Rx has been faxed to the pharmacy.

## 2014-07-14 NOTE — Telephone Encounter (Signed)
Patient is requesting we prescribe a sedative and send it to [place] for him to take prior to MRI.  Please advise.  Thank you.

## 2014-07-14 NOTE — Telephone Encounter (Signed)
I can prescribe him some valium but I can't call it in to a pharmacy. Not sure if I can even fax it - can I fax it or does he need the original copy to take with him?

## 2014-07-14 NOTE — Telephone Encounter (Signed)
If it's printed and signed, we can fax it to the pharmacy, no problem!

## 2014-07-15 DIAGNOSIS — H543 Unqualified visual loss, both eyes: Secondary | ICD-10-CM

## 2014-07-15 DIAGNOSIS — I639 Cerebral infarction, unspecified: Secondary | ICD-10-CM

## 2014-07-15 DIAGNOSIS — H53462 Homonymous bilateral field defects, left side: Secondary | ICD-10-CM | POA: Diagnosis not present

## 2014-07-17 ENCOUNTER — Inpatient Hospital Stay
Admission: RE | Admit: 2014-07-17 | Discharge: 2014-07-17 | Disposition: A | Discharge status: Home / Self Care | Payer: Medicare Other | Source: Ambulatory Visit | Attending: Neurology | Admitting: Neurology

## 2014-07-18 ENCOUNTER — Encounter (INDEPENDENT_AMBULATORY_CARE_PROVIDER_SITE_OTHER): Payer: Medicare Other

## 2014-07-18 ENCOUNTER — Ambulatory Visit (INDEPENDENT_AMBULATORY_CARE_PROVIDER_SITE_OTHER): Payer: Medicare Other | Admitting: Neurology

## 2014-07-18 DIAGNOSIS — R531 Weakness: Secondary | ICD-10-CM | POA: Diagnosis not present

## 2014-07-18 DIAGNOSIS — R202 Paresthesia of skin: Secondary | ICD-10-CM | POA: Diagnosis not present

## 2014-07-18 DIAGNOSIS — Z0289 Encounter for other administrative examinations: Secondary | ICD-10-CM

## 2014-07-18 DIAGNOSIS — H02403 Unspecified ptosis of bilateral eyelids: Secondary | ICD-10-CM

## 2014-07-18 DIAGNOSIS — H532 Diplopia: Secondary | ICD-10-CM

## 2014-07-18 NOTE — Progress Notes (Signed)
  [facility]    Provider:  Dr Jaynee Johnson Referring Provider: Florina Ou, Juan Johnson Primary Care Physician:  Juan Ou, Juan Johnson  History:  Juan Johnson is a 63 y.o. male here as a referral from Juan Johnson. He reports diplopia, droopy eyes at night left > right. Symptoms going on a year and getting worse. He has fatigue and gets very tired, worse at the end of the day. Difficulty getting out of seats. Having trouble lifting heavy objects. He can't climb ladders anymore. Has SOB and was diagnosed with early COPD. Burning pain in both feet, balance is off, and cramps in calfs and fingers with falls. Exam significant for impaired sensation distally in the lower extremities to pp, temp and proprioception.    Summary: Nerve Conduction studies were performed on the bilateral upper and bilateral lower extremities.   Right median motor nerve showed prolonged distal onset latency (4.8 ms, N<4.0).  Right Median 2nd Digit sensory nerve showed prolonged distal peak latency (4.8 ms, N<3.9)  Left median motor nerve showed prolonged distal onset latency (4.5 ms, N<4.0).  Left Median 2nd Digit sensory nerve showed prolonged distal peak latency (4.7 ms, N<3.9)  Bilateral median F waves were within normal limits.  Right Ulnar motor nerve was within normal limits with normal F wave latency.  Right 5th digit Ulnar motor nerve was within normal limits with normal F wave latency  Bilateral Peroneal and Tibial motor nerves were within normal limits. Bilateral Peroneal sensory nerves were within normal limits. Bilateral H Reflexes showed normal latencies  Right peroneal F wave showed prolonged latency (57.76ms, N<56) Left peroneal F wave showed prolonged latency (57.38ms, N<56) Right Tibial F wave showed prolonged latency (59.56ms, N<58) Left Tibial F wave was within normal limits.  Slow (3-Hz) repetitive stimulation of the right spinal accessory nerve, recording trapezius muscle at rest, immediately  post-maximal voluntary contraction, and at one-minute intervals for a total of five minutes, revealed no technically reliable abnormal electrodecremental response.  Slow (3-Hz) repetitive stimulation of the right median nerve, recording APB muscle at rest, immediately post-maximal voluntary contraction, and at one-minute intervals for a total of five minutes, revealed no technically reliable abnormal electrodecremental response.  EMG needle study of selected right upper and lower extremity muscles was performed. The following muscles were normal: Deltoid, Triceps, Pronator Teres, Opponens Pollicis, First Dorsal Interosseous, iliopsoas, Vastus Lateralis, Anterior Tibialis. Medial Gastrocnemius. The right T10 paraspinal muscle was normal.  Conclusion: There is electrophysiologic evidence of moderately-severe bilateral Carpal Tunnel Syndrome. No suggestion of neuromuscular junction disorder(Myasthenia Gravis, LEMS), myopathy or myositis. The  prolonged F waves may be suggestive of proximal L5/S1 radiculopathy or may be due to mild sensory axonal polyneuropathy.    Juan Ill, Juan Johnson  [facility] [address] [place], [place] [address]  Phone [phone] Fax [phone]

## 2014-07-20 ENCOUNTER — Telehealth: Payer: Self-pay | Admitting: Neurology

## 2014-07-20 ENCOUNTER — Other Ambulatory Visit: Payer: Self-pay | Admitting: Diagnostic Neuroimaging

## 2014-07-20 DIAGNOSIS — H543 Unqualified visual loss, both eyes: Secondary | ICD-10-CM

## 2014-07-20 DIAGNOSIS — H532 Diplopia: Secondary | ICD-10-CM

## 2014-07-20 NOTE — Telephone Encounter (Signed)
Richard Miu said you got a disk from [facility] with imaging for this patient and you were going to read it. Did you get a chance, by chance? :) I need to call him about emg/ncs and wanted to tell him abou this imaging too. Thank you!

## 2014-07-20 NOTE — Telephone Encounter (Signed)
Please comment of EMG/NCV patient calling for results.

## 2014-07-20 NOTE — Telephone Encounter (Signed)
Patient calling to request test results, please return call and advise.

## 2014-07-20 NOTE — Telephone Encounter (Signed)
Juan Johnson - would you call this patient too and let him know that I am waiting on the results of the MRI? I will follow up with Dr. Leta Baptist Ivin Booty said he has the disk) and see if he got to it. And when I get the results of the MRi I will call them. It may not be until tomorrow. I would like to see the MRI results in conjunction with the EMG/NCS results before I discuss everything with them. I'm following up on it.

## 2014-07-20 NOTE — Telephone Encounter (Signed)
Informed patient that Dr. Jaynee Eagles is waiting to review both MRI and EMG before she discuss finding/plan with patient. It may be tomorrow before she calls. Patient verbalizes understanding.

## 2014-07-21 DIAGNOSIS — M5137 Other intervertebral disc degeneration, lumbosacral region: Secondary | ICD-10-CM | POA: Diagnosis not present

## 2014-07-21 DIAGNOSIS — M545 Low back pain: Secondary | ICD-10-CM | POA: Diagnosis not present

## 2014-07-21 DIAGNOSIS — M5136 Other intervertebral disc degeneration, lumbar region: Secondary | ICD-10-CM | POA: Diagnosis not present

## 2014-07-25 ENCOUNTER — Other Ambulatory Visit: Payer: Self-pay | Admitting: Neurology

## 2014-07-25 DIAGNOSIS — I639 Cerebral infarction, unspecified: Secondary | ICD-10-CM

## 2014-07-25 DIAGNOSIS — D496 Neoplasm of unspecified behavior of brain: Secondary | ICD-10-CM

## 2014-07-27 ENCOUNTER — Telehealth: Payer: Self-pay | Admitting: Neurology

## 2014-07-27 NOTE — Telephone Encounter (Signed)
POS for MRI patient request

## 2014-07-27 NOTE — Telephone Encounter (Signed)
Patient scheduled to have Echo with bubbles 07/28/14 @ 1 pm. No PA required.

## 2014-07-27 NOTE — Telephone Encounter (Signed)
Patient calling to state that he wants his MRI to be done at [place]. Please call and advise.

## 2014-07-28 ENCOUNTER — Telehealth: Payer: Self-pay | Admitting: Neurology

## 2014-07-28 ENCOUNTER — Encounter (HOSPITAL_COMMUNITY): Payer: Self-pay | Admitting: *Deleted

## 2014-07-28 ENCOUNTER — Ambulatory Visit (HOSPITAL_COMMUNITY): Payer: Medicare Other | Attending: Cardiology | Admitting: Radiology

## 2014-07-28 DIAGNOSIS — I639 Cerebral infarction, unspecified: Secondary | ICD-10-CM | POA: Diagnosis not present

## 2014-07-28 DIAGNOSIS — Z87891 Personal history of nicotine dependence: Secondary | ICD-10-CM | POA: Insufficient documentation

## 2014-07-28 DIAGNOSIS — I10 Essential (primary) hypertension: Secondary | ICD-10-CM | POA: Diagnosis not present

## 2014-07-28 NOTE — Telephone Encounter (Signed)
Chrys Racer with [facility] @ [phone], calling regarding MRI scheduling. Patient scheduled for 10/16 with 9:45 arrival time.  Wanting to check with Renee to see if she scheduled correct MRI as requested via referral.  Please call and advise.

## 2014-07-28 NOTE — Progress Notes (Signed)
Echocardiogram performed with Bubble Study.  

## 2014-07-28 NOTE — Progress Notes (Signed)
Patient ID: Juan Johnson, male   DOB: 1951/02/21, 63 y.o.   MRN: [id number] 22G IV started R hand x 1 for Bubble Study. Site unremarkable. Crissie Figures, RN

## 2014-07-29 ENCOUNTER — Other Ambulatory Visit: Payer: Self-pay | Admitting: Neurology

## 2014-07-29 DIAGNOSIS — H53469 Homonymous bilateral field defects, unspecified side: Secondary | ICD-10-CM | POA: Diagnosis not present

## 2014-07-29 DIAGNOSIS — I639 Cerebral infarction, unspecified: Secondary | ICD-10-CM

## 2014-07-29 DIAGNOSIS — H532 Diplopia: Secondary | ICD-10-CM | POA: Diagnosis not present

## 2014-07-29 DIAGNOSIS — H5310 Unspecified subjective visual disturbances: Secondary | ICD-10-CM | POA: Diagnosis not present

## 2014-07-29 DIAGNOSIS — H547 Unspecified visual loss: Secondary | ICD-10-CM | POA: Diagnosis not present

## 2014-07-29 NOTE — Telephone Encounter (Signed)
Spoke to patient. He said he planned to have his cholesterol checked in November during his annual exam with his PCP, but does not mind coming in earlier to have them drawn if needed.Patient says he just had his MRI's done today. Patient would like to know does he need to wait until he gets his MRI results before having labs drawn or is it ok to wait until he see his PCP. Please advise

## 2014-07-29 NOTE — Telephone Encounter (Signed)
Juan Johnson - Would you call [name] and ask if he has had his cholesterol checked in the last year? If not, I put in an order and he should have it rechecked due to his stroke. Also would you see if he has scheduled his follow up MRIs please? Thank you!

## 2014-07-29 NOTE — Telephone Encounter (Signed)
Would you let patient know that it is not urgent and he can wait until November to have his cholesterol checked when he does labs with his PCP, that is completely fine. I will call him as soon as I get the MRI results maybe Tuesday. thanks

## 2014-08-01 ENCOUNTER — Other Ambulatory Visit: Payer: Self-pay | Admitting: Neurology

## 2014-08-01 DIAGNOSIS — D496 Neoplasm of unspecified behavior of brain: Secondary | ICD-10-CM

## 2014-08-01 DIAGNOSIS — I639 Cerebral infarction, unspecified: Secondary | ICD-10-CM

## 2014-08-01 NOTE — Telephone Encounter (Signed)
Called patient. Went over Dr. Cathren Laine previous note. Patient agreed and was grateful.

## 2014-08-02 ENCOUNTER — Other Ambulatory Visit: Payer: Self-pay | Admitting: Neurology

## 2014-08-02 MED ORDER — CLOPIDOGREL BISULFATE 75 MG PO TABS: 75.0000 mg | ORAL_TABLET | Freq: Every day | ORAL | Status: DC

## 2014-08-05 ENCOUNTER — Other Ambulatory Visit: Payer: Self-pay | Admitting: Neurology

## 2014-08-05 ENCOUNTER — Telehealth: Payer: Self-pay | Admitting: Neurology

## 2014-08-05 DIAGNOSIS — H53461 Homonymous bilateral field defects, right side: Secondary | ICD-10-CM

## 2014-08-05 DIAGNOSIS — J4530 Mild persistent asthma, uncomplicated: Secondary | ICD-10-CM | POA: Diagnosis not present

## 2014-08-05 DIAGNOSIS — J45909 Unspecified asthma, uncomplicated: Secondary | ICD-10-CM | POA: Diagnosis not present

## 2014-08-05 DIAGNOSIS — R042 Hemoptysis: Secondary | ICD-10-CM | POA: Diagnosis not present

## 2014-08-05 DIAGNOSIS — R05 Cough: Secondary | ICD-10-CM | POA: Diagnosis not present

## 2014-08-05 DIAGNOSIS — R040 Epistaxis: Secondary | ICD-10-CM | POA: Diagnosis not present

## 2014-08-05 MED ORDER — ASPIRIN-DIPYRIDAMOLE ER 25-200 MG PO CP12: 1.0000 | ORAL_CAPSULE | Freq: Two times a day (BID) | ORAL | Status: DC

## 2014-08-05 NOTE — Telephone Encounter (Signed)
Patient calling to state that he believes his clopidogrel caused him to have a nosebleed today and that it took awhile for it to stop, please return call and advise.

## 2014-08-05 NOTE — Telephone Encounter (Signed)
Spoke to patient. Patient is having bloody nose and then some blood while coughing, has been coughing very badly the last week. He just started the Plavix and it is unlikely the cause however I have asked him to stop the Plavix until the bleeding stops then will try Aggrenox. Called and spoke to Ms. Cobb who is a Loss adjuster, chartered with Dr. Modena Morrow in [place] who saw Mr. Hass today. Thank you

## 2014-08-09 NOTE — Telephone Encounter (Signed)
Dr.Ahern has handled this call.

## 2014-08-15 ENCOUNTER — Telehealth: Payer: Self-pay | Admitting: Neurology

## 2014-08-15 DIAGNOSIS — H47632 Disorders of visual cortex in (due to) neoplasm, left side of brain: Secondary | ICD-10-CM | POA: Diagnosis not present

## 2014-08-15 DIAGNOSIS — H47642 Disorders of visual cortex in (due to) vascular disorders, left side of brain: Secondary | ICD-10-CM | POA: Diagnosis not present

## 2014-08-15 NOTE — Telephone Encounter (Signed)
Patient calling to ask some questions regarding his stroke and where it was located, and also to check on his ophthalmologist referral, please return call and advise.

## 2014-08-16 NOTE — Telephone Encounter (Signed)
Scheduled follow up appt., per patient's schedule. Advised showing referral faxed to Dr. Verner Chol office. Patient said he will call and make sure received.

## 2014-08-19 DIAGNOSIS — S61211A Laceration without foreign body of left index finger without damage to nail, initial encounter: Secondary | ICD-10-CM | POA: Diagnosis not present

## 2014-08-19 DIAGNOSIS — S61201A Unspecified open wound of left index finger without damage to nail, initial encounter: Secondary | ICD-10-CM | POA: Diagnosis not present

## 2014-08-22 ENCOUNTER — Ambulatory Visit (INDEPENDENT_AMBULATORY_CARE_PROVIDER_SITE_OTHER): Payer: Medicare Other | Admitting: Internal Medicine

## 2014-08-22 ENCOUNTER — Encounter: Payer: Self-pay | Admitting: Internal Medicine

## 2014-08-22 ENCOUNTER — Encounter (INDEPENDENT_AMBULATORY_CARE_PROVIDER_SITE_OTHER): Payer: Self-pay

## 2014-08-22 VITALS — BP 118/74 | HR 82 | Ht 69.0 in | Wt 226.0 lb

## 2014-08-22 DIAGNOSIS — R059 Cough, unspecified: Secondary | ICD-10-CM

## 2014-08-22 DIAGNOSIS — I10 Essential (primary) hypertension: Secondary | ICD-10-CM

## 2014-08-22 DIAGNOSIS — I639 Cerebral infarction, unspecified: Secondary | ICD-10-CM

## 2014-08-22 DIAGNOSIS — R05 Cough: Secondary | ICD-10-CM | POA: Diagnosis not present

## 2014-08-22 DIAGNOSIS — R058 Other specified cough: Secondary | ICD-10-CM | POA: Insufficient documentation

## 2014-08-22 MED ORDER — VALSARTAN-HYDROCHLOROTHIAZIDE 160-12.5 MG PO TABS: 1.0000 | ORAL_TABLET | Freq: Every day | ORAL | Status: DC

## 2014-08-22 MED ORDER — METHYLPREDNISOLONE ACETATE 80 MG/ML IJ SUSP
120.0000 mg | Freq: Once | INTRAMUSCULAR | Status: AC
  Administered 2014-08-22: 120 mg via INTRAMUSCULAR

## 2014-08-22 NOTE — Patient Instructions (Addendum)
Depodrol  120 mg today  Stop lisinopril and flovent   Change prilosec to 20 mg Take 30- 60 min before your first and last meals of the day   GERD (REFLUX)  is an extremely common cause of respiratory symptoms just like yours , many times with no obvious heartburn at all.    It can be treated with medication, but also with lifestyle changes including avoidance of late meals, excessive alcohol, smoking cessation, and avoid fatty foods, chocolate, peppermint, colas, red wine, and acidic juices such as orange juice.  NO MINT OR MENTHOL PRODUCTS SO NO COUGH DROPS  USE SUGARLESS CANDY INSTEAD (Jolley ranchers or Stover's or Life Savers) or even ice chips will also do - the key is to swallow to prevent all throat clearing. NO OIL BASED VITAMINS - use powdered substitutes.    Only use your albuterol as a rescue medication to be used if you can't catch your breath by resting or doing a relaxed purse lip breathing pattern.  - The less you use it, the better it will work when you need it. - Ok to use up to 2 puffs  every 4 hours if you must but call for immediate appointment if use goes up over your usual need - Don't leave home without it !!  (think of it like the spare tire for your car)   Please schedule a follow up office visit in 4 weeks, sooner if needed with pfts

## 2014-08-22 NOTE — Assessment & Plan Note (Signed)
ACE inhibitors are problematic in  pts with airway complaints because  even experienced pulmonologists can't always distinguish ace effects from copd/asthma.  By themselves they don't actually cause a problem, much like oxygen can't by itself start a fire, but they certainly serve as a powerful catalyst or enhancer for any "fire"  or inflammatory process in the upper airway, be it caused by an ET  tube or more commonly reflux (especially in the obese or pts with known GERD or who are on biphoshonates).    In the era of ARB near equivalency until we have a better handle on the reversibility of the airway problem, it just makes sense to avoid ACEI  entirely  For 4 weeks minimum - this is the only way to prove any cause and effect between his symptoms, which are not typical at all of copd or asthma, and his chronic ACEi use

## 2014-08-22 NOTE — Progress Notes (Signed)
Subjective:    Patient ID: Juan Johnson, male    DOB: 14-Jun-1951   MRN: [id number]  HPI  37 yowm quit smoking around 1995 with h/o "bronchial troubles" all the way back to childhood with bad short flares of cough with no meds in between with no need for any daily therapy but starting summer 2015 bad cough fits seem much  better with depomedrol  But then relapse within a few days back coughing again so referred by Dr Greta Doom 08/22/2014 having failed to improve on flovent max strength    08/22/2014 1st [facility] office visit/ Jakeline Dave   Chief Complaint  Patient presents with  . Pulmonary Consult    Referred per Dr. Modena Morrow.  Pt states dxed with COPD approx 2-3 yrs ago. Pt c/o SOB and cough on and off for the past 6 months.  He states does okay walking from his house to his barn and wlaking any distance at an incline. Cough is prod with minimal yellow to white sputum.  He also c/o CP that comes and goes when he coughs.   usually able to walk from barn to house ok but not since onset of this new pattern of day time cough in summer of 2015 No noct or early am cough or sign excess mucus production   No obvious other patterns in day to day or daytime variabilty or assoc pleuritic or ex  cp or chest tightness, subjective wheeze overt sinus or hb symptoms. No unusual exp hx or h/o childhood pna/ asthma or knowledge of premature birth.  Sleeping ok without nocturnal  or early am exacerbation  of respiratory  c/o's or need for noct saba. Also denies any obvious fluctuation of symptoms with weather or environmental changes or other aggravating or alleviating factors except as outlined above   Current Medications, Allergies, Complete Past Medical History, Past Surgical History, Family History, and Social History were reviewed in [employer] record.                 Review of Systems  Constitutional: Negative for fever, chills, activity change, appetite change and  unexpected weight change.  HENT: Positive for sore throat. Negative for congestion, dental problem, postnasal drip, rhinorrhea, sneezing, trouble swallowing and voice change.   Eyes: Negative for visual disturbance.  Respiratory: Positive for cough and shortness of breath. Negative for choking.   Cardiovascular: Positive for chest pain. Negative for leg swelling.  Gastrointestinal: Negative for nausea, vomiting and abdominal pain.  Genitourinary: Negative for difficulty urinating.       Indigestion  Musculoskeletal: Negative for arthralgias.  Skin: Negative for rash.  Psychiatric/Behavioral: Negative for behavioral problems and confusion.       Objective:   Physical Exam   Pleasant hoarse amb wm nad with cough and pseudowheeze triggered by insp  Wt Readings from Last 3 Encounters:  08/22/14 226 lb (102.513 kg)  06/24/14 225 lb 12.8 oz (102.422 kg)  10/12/12 215 lb (97.523 kg)    Vital signs reviewed    HEENT: nl dentition, turbinates, and orophanx. Nl external ear canals without cough reflex   NECK :  without JVD/Nodes/TM/ nl carotid upstrokes bilaterally   LUNGS: no acc muscle use, clear to A and P bilaterally with cough on insp     CV:  RRR  no s3 or murmur or increase in P2, no edema   ABD:  soft and nontender with nl excursion in the supine position. No bruits or organomegaly, bowel sounds  nl  MS:  warm without deformities, calf tenderness, cyanosis or clubbing  SKIN: warm and dry without lesions    NEURO:  alert, approp, no deficits    cxr 08/05/14 fine per pt and wife      Assessment & Plan:

## 2014-08-22 NOTE — Assessment & Plan Note (Addendum)
The most common causes of chronic cough in immunocompetent adults include the following: upper airway cough syndrome (UACS), previously referred to as postnasal drip syndrome (PNDS), which is caused by variety of rhinosinus conditions; (2) asthma; (3) GERD; (4) chronic bronchitis from cigarette smoking or other inhaled environmental irritants; (5) nonasthmatic eosinophilic bronchitis; and (6) bronchiectasis.   These conditions, singly or in combination, have accounted for up to 94% of the causes of chronic cough in prospective studies.   Other conditions have constituted no >6% of the causes in prospective studies These have included bronchogenic carcinoma, chronic interstitial pneumonia, sarcoidosis, left ventricular failure, ACEI-induced cough, and aspiration from a condition associated with pharyngeal dysfunction.    Chronic cough is often simultaneously caused by more than one condition. A single cause has been found from 38 to 82% of the time, multiple causes from 18 to 62%. Multiply caused cough has been the result of three diseases up to 42% of the time.       Based on hx and exam, this is most likely:  Classic Upper airway cough syndrome, so named because it's frequently impossible to sort out how much is  CR/sinusitis with freq throat clearing (which can be related to primary GERD)   vs  causing  secondary (" extra esophageal")  GERD from wide swings in gastric pressure that occur with throat clearing, often  promoting self use of mint and menthol lozenges that reduce the lower esophageal sphincter tone and exacerbate the problem further in a cyclical fashion.   These are the same pts (now being labeled as having "irritable larynx syndrome" by some cough centers) who not infrequently have a history of having failed to tolerate ace inhibitors,  dry powder inhalers like flovent or biphosphonates or report having atypical reflux symptoms that don't respond to standard doses of PPI , and are easily  confused as having aecopd or asthma flares by even experienced allergists/ pulmonologists.   The first step is to maximize acid suppression and eliminate acei and flovent then regroup with pfts in 4 weeks given previous h/o smoking  See instructions for specific recommendations which were reviewed directly with the patient who was given a copy with highlighter outlining the key components.

## 2014-08-25 ENCOUNTER — Telehealth: Payer: Self-pay | Admitting: Neurology

## 2014-09-02 DIAGNOSIS — Z23 Encounter for immunization: Secondary | ICD-10-CM | POA: Diagnosis not present

## 2014-09-02 DIAGNOSIS — I10 Essential (primary) hypertension: Secondary | ICD-10-CM | POA: Diagnosis not present

## 2014-09-02 DIAGNOSIS — K219 Gastro-esophageal reflux disease without esophagitis: Secondary | ICD-10-CM | POA: Diagnosis not present

## 2014-09-02 DIAGNOSIS — E785 Hyperlipidemia, unspecified: Secondary | ICD-10-CM | POA: Diagnosis not present

## 2014-09-15 ENCOUNTER — Ambulatory Visit (INDEPENDENT_AMBULATORY_CARE_PROVIDER_SITE_OTHER): Payer: Medicare Other | Admitting: Neurology

## 2014-09-15 ENCOUNTER — Encounter: Payer: Self-pay | Admitting: Neurology

## 2014-09-15 VITALS — BP 132/71 | HR 64 | Ht 69.5 in | Wt 225.0 lb

## 2014-09-15 DIAGNOSIS — I639 Cerebral infarction, unspecified: Secondary | ICD-10-CM | POA: Diagnosis not present

## 2014-09-15 DIAGNOSIS — E538 Deficiency of other specified B group vitamins: Secondary | ICD-10-CM

## 2014-09-15 MED ORDER — ASPIRIN-DIPYRIDAMOLE ER 25-200 MG PO CP12: 1.0000 | ORAL_CAPSULE | Freq: Two times a day (BID) | ORAL | Status: DC
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
## 2014-09-20 ENCOUNTER — Telehealth: Payer: Self-pay | Admitting: *Deleted

## 2014-09-20 NOTE — Telephone Encounter (Signed)
Sent release to [facility] [phone].

## 2014-09-21 ENCOUNTER — Other Ambulatory Visit: Payer: Self-pay | Admitting: Internal Medicine

## 2014-09-21 DIAGNOSIS — R059 Cough, unspecified: Secondary | ICD-10-CM

## 2014-09-21 DIAGNOSIS — R05 Cough: Secondary | ICD-10-CM

## 2014-09-22 ENCOUNTER — Encounter: Payer: Self-pay | Admitting: Internal Medicine

## 2014-09-22 ENCOUNTER — Ambulatory Visit (INDEPENDENT_AMBULATORY_CARE_PROVIDER_SITE_OTHER): Payer: Medicare Other | Admitting: Internal Medicine

## 2014-09-22 ENCOUNTER — Encounter (INDEPENDENT_AMBULATORY_CARE_PROVIDER_SITE_OTHER): Payer: Self-pay

## 2014-09-22 VITALS — BP 122/76 | HR 65 | Ht 69.0 in | Wt 224.0 lb

## 2014-09-22 DIAGNOSIS — I10 Essential (primary) hypertension: Secondary | ICD-10-CM | POA: Diagnosis not present

## 2014-09-22 DIAGNOSIS — I639 Cerebral infarction, unspecified: Secondary | ICD-10-CM

## 2014-09-22 DIAGNOSIS — R05 Cough: Secondary | ICD-10-CM | POA: Diagnosis not present

## 2014-09-22 DIAGNOSIS — J449 Chronic obstructive pulmonary disease, unspecified: Secondary | ICD-10-CM | POA: Diagnosis not present

## 2014-09-22 DIAGNOSIS — R059 Cough, unspecified: Secondary | ICD-10-CM

## 2014-09-22 DIAGNOSIS — R058 Other specified cough: Secondary | ICD-10-CM

## 2014-09-22 LAB — PULMONARY FUNCTION TEST
DL/VA % PRED: 87 %
DL/VA: 3.99 ml/min/mmHg/L
DLCO UNC: 25.55 ml/min/mmHg
DLCO unc % pred: 82 %
FEF 25-75 POST: 1.69 L/s
FEF 25-75 Pre: 1.69 L/sec
FEF2575-%Change-Post: 0 %
FEF2575-%PRED-POST: 65 %
FEF2575-%PRED-PRE: 64 %
FEV1-%Change-Post: 1 %
FEV1-%PRED-PRE: 80 %
FEV1-%Pred-Post: 81 %
FEV1-PRE: 2.64 L
FEV1-Post: 2.67 L
FEV1FVC-%Change-Post: 0 %
FEV1FVC-%PRED-PRE: 88 %
FEV6-%CHANGE-POST: 0 %
FEV6-%Pred-Post: 94 %
FEV6-%Pred-Pre: 93 %
FEV6-Post: 3.93 L
FEV6-Pre: 3.93 L
FEV6FVC-%Change-Post: 0 %
FEV6FVC-%PRED-POST: 104 %
FEV6FVC-%Pred-Pre: 104 %
FVC-%Change-Post: 0 %
FVC-%PRED-POST: 90 %
FVC-%Pred-Pre: 89 %
FVC-Post: 3.99 L
FVC-Pre: 3.96 L
Post FEV1/FVC ratio: 67 %
Post FEV6/FVC ratio: 99 %
Pre FEV1/FVC ratio: 67 %
Pre FEV6/FVC Ratio: 99 %
RV % pred: 105 %
RV: 2.43 L
TLC % pred: 95 %
TLC: 6.53 L

## 2014-09-22 NOTE — Patient Instructions (Signed)
If you are satisfied with your treatment plan,  let your doctor know and he/she can either refill your medications or you can return here when your prescription runs out.     If in any way you are not 100% satisfied,  please tell us.  If 100% better, tell your friends!  Pulmonary follow up is as needed   

## 2014-09-22 NOTE — Progress Notes (Signed)
Subjective:   Patient ID: Juan Johnson, male    DOB: [birth year]   MRN: [id number]    Brief patient profile:  73 yowm quit smoking around 1995 with h/o "bronchial troubles" all the way back to childhood with bad short flares of cough with no meds in between with no need for any daily therapy but starting summer 2015 bad cough fits seem much  better with depomedrol  But then relapse within a few days back coughing again so referred by Dr Greta Doom 08/22/2014 having failed to improve on flovent max strength with mild airflow obst by pfts 09/23/14 c/w GOLD I copd     08/22/2014 1st [facility] office visit/ Juan Johnson   Chief Complaint  Patient presents with  . Pulmonary Consult    Referred per Dr. Modena Morrow.  Pt states dxed with COPD approx 2-3 yrs ago. Pt c/o SOB and cough on and off for the past 6 months.  He states does okay walking from his house to his barn and wlaking any distance at an incline. Cough is prod with minimal yellow to white sputum.  He also c/o CP that comes and goes when he coughs.   usually able to walk from barn to house ok but not since onset of this new pattern of day time cough in summer of 2015 No noct or early am cough or sign excess mucus production  rec Depodrol  120 mg today Stop lisinopril and flovent  Change prilosec to 20 mg Take 30- 60 min before your first and last meals of the day  GERD Only use your albuterol as a rescue medication prn   09/22/2014 f/u ov/Juan Johnson re: copd GOLD I/ symptoms better off acei  Chief Complaint  Patient presents with  . Follow-up    Pt states that his cough and SOB are much better. No new co's today.    .Not limited by breathing from desired activities  / not needing any saba at all  No obvious day to day or daytime variabilty or assoc excess or purulent sputum or cp or chest tightness, subjective wheeze overt sinus or hb symptoms. No unusual exp hx or h/o childhood pna/ asthma or knowledge of premature birth.  Sleeping ok  without nocturnal  or early am exacerbation  of respiratory  c/o's or need for noct saba. Also denies any obvious fluctuation of symptoms with weather or environmental changes or other aggravating or alleviating factors except as outlined above   Current Medications, Allergies, Complete Past Medical History, Past Surgical History, Family History, and Social History were reviewed in [employer] record.  ROS  The following are not active complaints unless bolded sore throat, dysphagia, dental problems, itching, sneezing,  nasal congestion or excess/ purulent secretions, ear ache,   fever, chills, sweats, unintended wt loss, pleuritic or exertional cp, hemoptysis,  orthopnea pnd or leg swelling, presyncope, palpitations, heartburn, abdominal pain, anorexia, nausea, vomiting, diarrhea  or change in bowel or urinary habits, change in stools or urine, dysuria,hematuria,  rash, arthralgias, visual complaints, headache, numbness weakness or ataxia or problems with walking or coordination,  change in mood/affect or memory.             Objective:   Physical Exam   Pleasant hoarse amb wm nad      09/22/2014     224  Wt Readings from Last 3 Encounters:  08/22/14 226 lb (102.513 kg)  06/24/14 225 lb 12.8 oz (102.422 kg)  10/12/12 215 lb (97.523  kg)    Vital signs reviewed    HEENT: nl dentition, turbinates, and orophanx. Nl external ear canals without cough reflex   NECK :  without JVD/Nodes/TM/ nl carotid upstrokes bilaterally   LUNGS: no acc muscle use, clear to A and P bilaterally with cough on insp  - does have pseudowheeze only  with fvc better    CV:  RRR  no s3 or murmur or increase in P2, no edema   ABD:  soft and nontender with nl excursion in the supine position. No bruits or organomegaly, bowel sounds nl  MS:  warm without deformities, calf tenderness, cyanosis or clubbing  SKIN: warm and dry without lesions    NEURO:  alert, approp, no  deficits    cxr 08/05/14 fine per pt and wife      Assessment & Plan:

## 2014-09-22 NOTE — Progress Notes (Signed)
PFT done today. 

## 2014-09-25 ENCOUNTER — Encounter: Payer: Self-pay | Admitting: Internal Medicine

## 2014-09-25 DIAGNOSIS — J449 Chronic obstructive pulmonary disease, unspecified: Secondary | ICD-10-CM | POA: Insufficient documentation

## 2014-09-25 NOTE — Assessment & Plan Note (Signed)
Much better off acei sinc 08/22/14 and given the ambiguity inherent in interpreting his resp symptoms, which are predominantly upper airway in nature, I would avoid them in the future esp since alternative is working well    rec continue diovanhct 160/12.5 and f/u primary care.

## 2014-09-25 NOTE — Assessment & Plan Note (Signed)
PFts 09/22/14 FEV1  2.64 (80%) ratio 67 and no change p B2 with dlco 82    I reviewed the Flethcher curve with patient that basically indicates  if you quit smoking when your best day FEV1 is still well preserved (as is the case here)  it is highly unlikely you will progress to severe disease and informed the patient there was no medication on the market that has proven to change the curve or the likelihood of progression.  Therefore stopping smoking and maintaining abstinence is the most important aspect of care, not choice of inhalers or for that matter, doctors.    He may be at risk for mild flares with URIs but should be able to control this with prn saba, reviewed.

## 2014-09-25 NOTE — Assessment & Plan Note (Signed)
Trial off acei and flovent 08/22/2014 >> much better, no further w/u needed

## 2014-10-12 ENCOUNTER — Other Ambulatory Visit: Payer: Self-pay | Admitting: Neurology

## 2014-10-18 DIAGNOSIS — M545 Low back pain: Secondary | ICD-10-CM | POA: Diagnosis not present

## 2014-10-18 DIAGNOSIS — M5136 Other intervertebral disc degeneration, lumbar region: Secondary | ICD-10-CM | POA: Diagnosis not present

## 2014-10-19 ENCOUNTER — Telehealth: Payer: Self-pay | Admitting: Neurology

## 2014-10-19 NOTE — Telephone Encounter (Signed)
Patient stated pharmacist informed him that Rx meloxicam (MOBIC) 15 MG tablet and nortriptyline (PAMELOR) 25 MG capsule may cause blood to thin.  Patient also takes Rx dipyridamole-aspirin (AGGRENOX) 200-25 MG per 12 hr capsule and hands are bleeding and noticing bright red blood in BM.  Questioning if he should discontinue taking MOBICE and NORTRIPTYLINE.  Please call and advise.
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
## 2014-10-19 NOTE — Telephone Encounter (Signed)
I called back.  Patient says he has had blood in stool and his has are red.  He is currently taking Aggrenox (unable to take Plavix) Mobic and Pamelor.  Says he contacted the doctor who wrote the Rx's for Mobic and Pamelor and they recommended he call us.  Would like to know if he should discontinue any/all of the medications due to symptoms.  Dr Jaynee Eagles is out of the office.  Forwarding request to [facility] for review.  Please advise.  Thank you.

## 2014-11-14 ENCOUNTER — Telehealth: Payer: Self-pay | Admitting: Neurology

## 2014-11-14 ENCOUNTER — Other Ambulatory Visit: Payer: Self-pay | Admitting: Neurology

## 2014-11-14 MED ORDER — ACETAMINOPHEN 325 MG PO TABS: 650.0000 mg | ORAL_TABLET | Freq: Four times a day (QID) | ORAL | Status: DC | PRN

## 2014-11-14 NOTE — Telephone Encounter (Signed)
I called back.  Patient's other provider placed him on Celebrex (changed from Mobic), which caused chest tightness.  Patient denies any SOB, anaphylaxis or other side effects.  Recommended he contact the prescribing physician as soon as possible regarding what steps to take next.  Says he will do so, but first wants to know if Dr Jaynee Eagles feels it is okay for him to take Tylenol.  Wants Dr Cathren Laine approval prior to discussing changing from NSAID to APAP with other provider.  Please advise.  Thank you.

## 2014-11-14 NOTE — Telephone Encounter (Signed)
I told him to stop the meloxicam and stop Celebrex. He will take Tylenol instead. Thanks.

## 2014-11-14 NOTE — Telephone Encounter (Signed)
Patient is calling as he was prescribed Celebrex which is calling tightness of chest.  Can he go back to Tylenol.  Please call.

## 2014-11-15 DIAGNOSIS — M5136 Other intervertebral disc degeneration, lumbar region: Secondary | ICD-10-CM | POA: Diagnosis not present

## 2014-11-15 DIAGNOSIS — M545 Low back pain: Secondary | ICD-10-CM | POA: Diagnosis not present

## 2014-11-22 DIAGNOSIS — H524 Presbyopia: Secondary | ICD-10-CM | POA: Diagnosis not present

## 2014-11-22 DIAGNOSIS — H35379 Puckering of macula, unspecified eye: Secondary | ICD-10-CM | POA: Diagnosis not present

## 2014-11-22 DIAGNOSIS — H4011X2 Primary open-angle glaucoma, moderate stage: Secondary | ICD-10-CM | POA: Diagnosis not present

## 2014-11-22 DIAGNOSIS — H47642 Disorders of visual cortex in (due to) vascular disorders, left side of brain: Secondary | ICD-10-CM | POA: Diagnosis not present

## 2014-12-13 DIAGNOSIS — H4011X3 Primary open-angle glaucoma, severe stage: Secondary | ICD-10-CM | POA: Diagnosis not present

## 2014-12-13 DIAGNOSIS — H35372 Puckering of macula, left eye: Secondary | ICD-10-CM | POA: Diagnosis not present

## 2014-12-13 DIAGNOSIS — H4040X0 Glaucoma secondary to eye inflammation, unspecified eye, stage unspecified: Secondary | ICD-10-CM | POA: Diagnosis not present

## 2014-12-13 DIAGNOSIS — H2012 Chronic iridocyclitis, left eye: Secondary | ICD-10-CM | POA: Diagnosis not present

## 2015-01-23 ENCOUNTER — Telehealth: Payer: Self-pay | Admitting: Neurology

## 2015-01-23 ENCOUNTER — Other Ambulatory Visit: Payer: Self-pay | Admitting: Neurology

## 2015-01-23 DIAGNOSIS — C719 Malignant neoplasm of brain, unspecified: Secondary | ICD-10-CM

## 2015-01-23 NOTE — Telephone Encounter (Signed)
Please call and let patient know we discussed repeating the MRI of his brain in 6 months to further evaluate the evolution of his lesion of left occpital periventricular subcortical and juxtacortical white matter.

## 2015-01-25 ENCOUNTER — Other Ambulatory Visit: Payer: Self-pay | Admitting: Neurology

## 2015-01-25 NOTE — Telephone Encounter (Signed)
Left message for patient

## 2015-01-26 DIAGNOSIS — H539 Unspecified visual disturbance: Secondary | ICD-10-CM | POA: Diagnosis not present

## 2015-01-26 DIAGNOSIS — H53461 Homonymous bilateral field defects, right side: Secondary | ICD-10-CM | POA: Diagnosis not present

## 2015-01-27 ENCOUNTER — Telehealth: Payer: Self-pay | Admitting: *Deleted

## 2015-01-27 ENCOUNTER — Other Ambulatory Visit: Payer: Self-pay | Admitting: Neurology

## 2015-01-27 DIAGNOSIS — C719 Malignant neoplasm of brain, unspecified: Secondary | ICD-10-CM

## 2015-01-27 NOTE — Telephone Encounter (Signed)
Talked with patient about MRI brain results per Dr. Jaynee Eagles notes. Patient verbalized understanding. Dr. Jaynee Eagles also spoke with patient.

## 2015-01-29 ENCOUNTER — Other Ambulatory Visit: Payer: Self-pay | Admitting: Neurology

## 2015-01-29 DIAGNOSIS — M5416 Radiculopathy, lumbar region: Secondary | ICD-10-CM

## 2015-01-29 DIAGNOSIS — M2560 Stiffness of unspecified joint, not elsewhere classified: Secondary | ICD-10-CM

## 2015-01-30 NOTE — Telephone Encounter (Signed)
Reviewed MRi results with patient and ordered PT for lumbar radic thanks

## 2015-02-09 ENCOUNTER — Ambulatory Visit: Payer: Medicare Other | Attending: Neurology | Admitting: Physical Therapy

## 2015-02-09 DIAGNOSIS — I10 Essential (primary) hypertension: Secondary | ICD-10-CM | POA: Diagnosis not present

## 2015-02-09 DIAGNOSIS — R262 Difficulty in walking, not elsewhere classified: Secondary | ICD-10-CM

## 2015-02-09 DIAGNOSIS — H543 Unqualified visual loss, both eyes: Secondary | ICD-10-CM | POA: Insufficient documentation

## 2015-02-09 DIAGNOSIS — M1990 Unspecified osteoarthritis, unspecified site: Secondary | ICD-10-CM | POA: Diagnosis not present

## 2015-02-09 DIAGNOSIS — J449 Chronic obstructive pulmonary disease, unspecified: Secondary | ICD-10-CM | POA: Insufficient documentation

## 2015-02-09 DIAGNOSIS — Z8673 Personal history of transient ischemic attack (TIA), and cerebral infarction without residual deficits: Secondary | ICD-10-CM | POA: Insufficient documentation

## 2015-02-09 DIAGNOSIS — M2560 Stiffness of unspecified joint, not elsewhere classified: Secondary | ICD-10-CM

## 2015-02-09 DIAGNOSIS — M5416 Radiculopathy, lumbar region: Secondary | ICD-10-CM | POA: Diagnosis not present

## 2015-02-09 DIAGNOSIS — G629 Polyneuropathy, unspecified: Secondary | ICD-10-CM | POA: Diagnosis not present

## 2015-02-10 NOTE — Therapy (Signed)
[place], [place], [address] Phone: [phone]   Fax:  [phone]  Physical Therapy Evaluation  Patient Details  Name: Juan Johnson MRN: [id number] Date of Birth: 09-08-1951 Referring Provider:  Melvenia Beam, MD  Encounter Date: 02/09/2015      PT End of Session - 02/10/15 0707    Visit Number 1   Number of Visits 16   Date for PT Re-Evaluation 04/06/15   Authorization Type Medicare Gcode   PT Start Time 1020   PT Stop Time 1105   PT Time Calculation (min) 45 min   Activity Tolerance Patient limited by pain      Past Medical History  Diagnosis Date  . Hypertension   . Arthritis     Past Surgical History  Procedure Laterality Date  . Shoulder surgery  09/24/2011  . Eye surgery  10/2007  . Neck surgery  06/29/2010  . Tonsillectomy      AT AGE 64  . Leg surgery      AT AGE 64    There were no vitals filed for this visit.  Visit Diagnosis:  Chronic lumbar radiculopathy - Plan: PT plan of care cert/re-cert  Joint stiffness of spine - Plan: PT plan of care cert/re-cert  Difficulty in walking - Plan: PT plan of care cert/re-cert      Subjective Assessment - 02/09/15 1025    Subjective Presents with LBP after long ago history of hurting his back working with his horse.  Now with weightbearing has increased pain for riding a tractor.  Injections over the past year but they stopped helping.  Numbness left lower leg and pain in thigh.  But also with right LE symptoms.   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 recliner I can sit OK   How long can you stand comfortably? it depends on how much Tylenol;  I can make a sandwich   How long can you walk comfortably?  patient unsure maybe a mile   Diagnostic tests "disc messed up lower back"   Patient Stated Goals everybody tells me I need to quit riding my horses but I'm going to try it again; lift 50# saddle    Currently in Pain? Yes   Pain Score 6    Pain Location Back   Pain Orientation Left   Pain Type Chronic pain   Pain Radiating Towards Left LE   Pain Onset More than a month ago   Aggravating Factors  lifting grandkids and 30# bags of feed; first thing in the AM   Pain Relieving Factors movement;  recliner with feet up; Tylenol            [facility] PT Assessment - 02/09/15 1038    Assessment   Medical Diagnosis lumbar radiculopathy   Onset Date 01/13/15   Next MD Visit Dr. Jaynee Eagles 6/2  Dr. Joya Salm recommended aquatics but patient not interested   Prior Therapy RTC only   Precautions   Precautions --  no lift 5-10#; limit alcohol   Restrictions   Weight Bearing Restrictions No   Balance Screen   Has the patient fallen in the past 6 months Yes  Juan Johnson   How many times? 2-3   Has the patient had a  decrease in activity level because of a fear of falling?  No   Is the patient reluctant to leave their home because of a fear of falling?  No   Home Environment   Living Enviornment Private residence   Living Arrangements Spouse/significant other   Type of [place] to enter   Entrance Stairs-Number of Steps [place] One level   Prior Function   Level of Independence Independent with basic ADLs   Vocation Retired  works on farm   Leisure ride horses   Cognition   Overall Cognitive Status --  difficult finding words since stroke   Observation/Other Assessments   Focus on Therapeutic Outcomes (FOTO)  58%  45% predicted   ROM / Strength   AROM / PROM / Strength AROM;Strength   AROM   AROM Assessment Site Lumbar;Hip   Right/Left Hip Right;Left   Right Hip Extension 0   Right Hip Flexion 85   Right Hip External Rotation  15   Right Hip Internal  Rotation  15   Left Hip Extension 0   Left Hip Flexion 80   Left Hip External Rotation  15   Left Hip Internal Rotation  15   Lumbar Extension 5   Strength   Overall Strength --  Able to rise from standard chair without UE use   Overall Strength Comments --  Pain with resistance left LE   Strength Assessment Site Hip;Knee;Ankle;Lumbar   Right/Left Hip Right;Left   Right Hip Flexion 4+/5   Right Hip Extension 4+/5   Right Hip ABduction 4+/5   Left Hip Flexion 4+/5   Left Hip Extension 4/5   Left Hip ABduction 4/5   Lumbar Flexion 4-/5   Lumbar Extension 4-/5   Special Tests    Special Tests Lumbar   Slump test   Findings Negative   Side Left   Comment --  only with resistance   Straight Leg Raise   Findings --  increased low back discomfort on left   Side  Left   other   Findings Positive   Side  Left   Comments Left long axis hip distraction   Ambulation/Gait   Ambulation/Gait Yes   Ambulation/Gait Assistance --  patient ambulates without assistive device; staggers 3x   Gait Comments staggers upon rising or changing directions                           PT Education - 02/10/15 0706    Education provided Yes   Education Details Discussed areas of deficit and treatment plan   Person(s) Educated Patient   Methods Explanation   Comprehension Verbalized understanding          PT Short Term Goals - 02/10/15 0726    PT SHORT TERM GOAL #1   Title Patient will demonstrate awareness of basic body mechanics and posture/positioning to decrease exacerbation of back and LE pain.   Time 4   Period Weeks   Status New   PT SHORT TERM GOAL #2   Title Patient will report a 25% reduction in back and LE pain with basic ADLS.   Time 4   Period Weeks   Status New   PT SHORT TERM GOAL #3   Title Bilateral hip flexion improved to 95 degrees bilaterally and hip IR and ER to 20 degrees needed for greater ease with sit to stand and in/out of the car  and on/off  tractor.   Time 4   Period Weeks   Status New           PT Long Term Goals - 02/10/15 0729    PT LONG TERM GOAL #1   Title Patient will be independent in a progressive HEP for further strengthening and joint mobility needed for maintaining function and decreasing pain.   Time 8   Period Weeks   Status New   PT LONG TERM GOAL #2   Title Patient will have improved left hip abd and extension strength to 4/5 needed for improved standing tolerance and stability when ambulating.   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   Time 8   Period Weeks   Status New   PT LONG TERM GOAL #5   Title Patient will report an overall improvement in pain and function by 50%   Time 8   Period Weeks   Status New               Plan - 02/10/15 0709    Clinical Impression Statement The patient is a pleasant 64 year old male with a long history of LBP after an incident with a horse many years ago resulting in a "bad disc".  He states injections helped for a while but then they stopped working.  He is not a surgical candidate because of his high risk for stroke.  Recent MRI brain shows chronic microvascular ischemia.  Juan Johnson reports that as a result of a previous stroke he has difficulty expressing words and this is apparent many times throughout the evaluation.  He also reports vision loss and his wife helps him complete FOTO functional outcome measure.  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  His back and left leg feel the best with sitting in his recliner.  In a standard chair his pain increases as well and he shifts repeatedly in the clinic chairs because of increased left LE pain.  He tends to stagger upon rising and change of direction.  He reports 2-3 falls in the past 6 months with one being a fall out of the Johnson of his [place].  Bilateral LE strength:  right grossly 4+/5, left 4/5 except hip ext and hip abd 4-/5.  Left LE painful with resisted movements.  General hip hypomobility bilaterally.  Decreased core strength and activation of lower abdominals.  No clear lumbar directional preference although patient did report temporary relief with left long axis hip distraction.  He would benefit from PT to address these deficits.     Pt will benefit from skilled therapeutic intervention in order to improve on the following deficits Pain;Decreased strength;Decreased balance;Difficulty walking;Decreased activity tolerance;Decreased range of motion   Rehab Potential Good   Clinical Impairments Affecting Rehab Potential chronic  pain; risk of falls; brain ischemic changes; vision loss; neuropathy   PT Frequency 2x / week   PT Duration 8 weeks   PT Treatment/Interventions Electrical Stimulation;Moist Heat;Traction;Ultrasound;Therapeutic activities;Therapeutic exercise;Patient/family education;Manual techniques;Balance training   PT Next Visit Plan BERG to assess fall risk;  Possible 6 min walk test; ? lumbar mechanical or manual traction; core strengthening; possible e-stim/heat          G-Codes - 2015/02/28 0736    Functional Assessment Tool Used FOTO; clinical judgement   Functional Limitation Mobility: Walking and moving around   Mobility: Walking and Moving Around Current Status (Q7591) At least 40 percent but less than 60 percent impaired, limited or restricted   Mobility: Walking and Moving Around Goal  Status (M3846) At least 20 percent but less than 40 percent impaired, limited or restricted       Problem List Patient Active Problem List   Diagnosis Date Noted  . COPD GOLD I 09/25/2014  . Upper airway cough syndrome 08/22/2014  . Essential hypertension 08/22/2014  . Diplopia 06/25/2014  . Vision loss, bilateral 06/25/2014  . Weakness 06/25/2014  . Unspecified hereditary and idiopathic peripheral neuropathy 06/25/2014    Alvera Singh 2015/02/28, 7:39 AM  [facility] [address] [place], [place], [address] Phone: [phone]   Fax:  [phone]   Ruben Im, PT 28-Feb-2015 7:40 AM Phone: [phone] Fax: [phone]

## 2015-02-14 ENCOUNTER — Ambulatory Visit: Payer: Medicare Other | Attending: Neurology | Admitting: Physical Therapy

## 2015-02-14 DIAGNOSIS — H543 Unqualified visual loss, both eyes: Secondary | ICD-10-CM | POA: Insufficient documentation

## 2015-02-14 DIAGNOSIS — Z8673 Personal history of transient ischemic attack (TIA), and cerebral infarction without residual deficits: Secondary | ICD-10-CM | POA: Diagnosis not present

## 2015-02-14 DIAGNOSIS — R262 Difficulty in walking, not elsewhere classified: Secondary | ICD-10-CM

## 2015-02-14 DIAGNOSIS — J449 Chronic obstructive pulmonary disease, unspecified: Secondary | ICD-10-CM | POA: Insufficient documentation

## 2015-02-14 DIAGNOSIS — I10 Essential (primary) hypertension: Secondary | ICD-10-CM | POA: Diagnosis not present

## 2015-02-14 DIAGNOSIS — M2560 Stiffness of unspecified joint, not elsewhere classified: Secondary | ICD-10-CM | POA: Insufficient documentation

## 2015-02-14 DIAGNOSIS — G629 Polyneuropathy, unspecified: Secondary | ICD-10-CM | POA: Diagnosis not present

## 2015-02-14 DIAGNOSIS — M1990 Unspecified osteoarthritis, unspecified site: Secondary | ICD-10-CM | POA: Diagnosis not present

## 2015-02-14 DIAGNOSIS — M5416 Radiculopathy, lumbar region: Secondary | ICD-10-CM | POA: Insufficient documentation

## 2015-02-14 NOTE — Therapy (Signed)
[place], [place], [address] Phone: [phone]   Fax:  [phone]  Physical Therapy Treatment  Patient Details  Name: Juan Johnson MRN: [id number] Date of Birth: 10/30/1950 Referring Provider:  Melvenia Beam, MD  Encounter Date: 02/14/2015      PT End of Session - 02/14/15 1444    Visit Number 2   Number of Visits 16   Date for PT Re-Evaluation 04/06/15   PT Start Time 1340   PT Stop Time 1435   PT Time Calculation (min) 55 min   Activity Tolerance Patient limited by pain      Past Medical History  Diagnosis Date  . Hypertension   . Arthritis     Past Surgical History  Procedure Laterality Date  . Shoulder surgery  09/24/2011  . Eye surgery  10/2007  . Neck surgery  06/29/2010  . Tonsillectomy      AT AGE 64  . Leg surgery      AT AGE 64    There were no vitals filed for this visit.  Visit Diagnosis:  Chronic lumbar radiculopathy  Joint stiffness of spine  Difficulty in walking      Subjective Assessment - 02/14/15 1342    Subjective Stiffness of spine observed upon sitting .  LLE numb today.  Pain not rated but appears moderate   Currently in Pain? Yes   Pain Score --  Mod   Pain Location Back   Pain Orientation Posterior;Lower;Other (Comment)  Central   Pain Type Chronic pain   Pain Onset More than a month ago   Pain Frequency Intermittent           [facility] Adult PT Treatment/Exercise - 02/14/15 1353    Lumbar Exercises: Stretches   Passive Hamstring Stretch 2 reps;30 seconds   Single Knee to Chest Stretch 3 reps;30 seconds   Lower Trunk Rotation 5 reps  x2    Pelvic Tilt 5 reps  x2   Lumbar Exercises: Supine   Ab Set 10 reps;5 seconds   Cryotherapy   Number Minutes Cryotherapy 15 Minutes   Cryotherapy Location Back;Hip   Type of Cryotherapy Ice pack   Electrical Stimulation   Electrical Stimulation Location Lt low back and prox L hip   Electrical  Stimulation Action IFC   Electrical Stimulation Parameters to tol   Electrical Stimulation Goals Pain      Given HEP for above ex and discussed posture, lifting, body mechanics with aid of handout.           PT Education - 02/14/15 1443    Education provided Yes   Education Details HEP and IFC/TENS for pain relief   Person(s) Educated Patient   Methods Explanation   Comprehension Verbalized understanding;Verbal cues required          PT Short Term Goals - 02/14/15 1448    PT SHORT TERM GOAL #1   Title Patient will demonstrate awareness of basic body mechanics and posture/positioning to decrease exacerbation of back and LE pain.   Status On-going   PT SHORT TERM GOAL #2   Title Patient will report a 25% reduction in back and LE pain with basic ADLS.   Status On-going   PT SHORT TERM GOAL #3   Title Bilateral hip flexion improved to 95 degrees bilaterally and hip IR and ER to 20 degrees needed for greater ease with sit to stand and in/out of the car and on/off tractor.  Status On-going           PT Long Term Goals - 02/14/15 1448    PT LONG TERM GOAL #1   Title Patient will be independent in a progressive HEP for further strengthening and joint mobility needed for maintaining function and decreasing pain.   Status On-going   PT LONG TERM GOAL #2   Title Patient will have improved left hip abd and extension strength to 4/5 needed for improved standing tolerance and stability when ambulating.   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   Status Unable to assess   PT LONG TERM GOAL #5   Title Patient will report an overall improvement in pain and function by 50%    Status On-going               Plan - 02/14/15 1445    Clinical Impression Statement Able to establish HEP for gentle trunk flexibility and core/abd activation.  Difficulty when lowering/lifing L LE up and down from the mat. He responded well to IFC and Ice in sidelying, reported no symptoms upon standing from treatmnt table.    PT Next Visit Plan BERG to assess fall risk;  Possible 6 min walk test; ? lumbar mechanical or manual traction; core strengthening;repeat IFC?    PT Home Exercise Plan trunk rot, S KTC, A/P tilt, ab set and seated HS stretch   Consulted and Agree with Plan of Care Patient        Problem List Patient Active Problem List   Diagnosis Date Noted  . COPD GOLD I 09/25/2014  . Upper airway cough syndrome 08/22/2014  . Essential hypertension 08/22/2014  . Diplopia 06/25/2014  . Vision loss, bilateral 06/25/2014  . Weakness 06/25/2014  . Unspecified hereditary and idiopathic peripheral neuropathy 06/25/2014    Nevaen Tredway 02/14/2015, 2:51 PM  [facility] [address] [place], [place], [address] Phone: [phone]   Fax:  [phone]

## 2015-02-14 NOTE — Patient Instructions (Addendum)
v   Sleeping on Back  Place pillow under knees. A pillow with cervical support and a roll around waist are also helpful. Copyright  VHI. All rights reserved.  Sleeping on Side Place pillow between knees. Use cervical support under neck and a roll around waist as needed. Copyright  VHI. All rights reserved.   Sleeping on Stomach   If this is the only desirable sleeping position, place pillow under lower legs, and under stomach or chest as needed.  Posture - Sitting   Sit upright, head facing forward. Try using a roll to support lower back. Keep shoulders relaxed, and avoid rounded back. Keep hips level with knees. Avoid crossing legs for long periods. Stand to Sit / Sit to Stand   To sit: Bend knees to lower self onto front edge of chair, then scoot back on seat. To stand: Reverse sequence by placing one foot forward, and scoot to front of seat. Use rocking motion to stand up.   Work Height and Reach  Ideal work height is no more than 2 to 4 inches below elbow level when standing, and at elbow level when sitting. Reaching should be limited to arm's length, with elbows slightly bent.  Bending  Bend at hips and knees, not back. Keep feet shoulder-width apart.    Posture - Standing   Good posture is important. Avoid slouching and forward head thrust. Maintain curve in low back and align ears over shoul- ders, hips over ankles.  Alternating Positions   Alternate tasks and change positions frequently to reduce fatigue and muscle tension. Take rest breaks. Computer Work   Position work to Programmer, multimedia. Use proper work and seat height. Keep shoulders back and down, wrists straight, and elbows at right angles. Use chair that provides full back support. Add footrest and lumbar roll as needed.  Getting Into / Out of Car  Lower self onto seat, scoot back, then bring in one leg at a time. Reverse sequence to get out.  Dressing  Lie on back to pull socks or slacks over feet,  or sit and bend leg while keeping back straight.    Housework - Sink  Place one foot on ledge of cabinet under sink when standing at sink for prolonged periods.   Pushing / Pulling  Pushing is preferable to pulling. Keep back in proper alignment, and use leg muscles to do the work.  Deep Squat   Squat and lift with both arms held against upper trunk. Tighten stomach muscles without holding breath. Use smooth movements to avoid jerking.  Avoid Twisting   Avoid twisting or bending back. Pivot around using foot movements, and bend at knees if needed when reaching for articles.  Carrying Luggage   Distribute weight evenly on both sides. Use a cart whenever possible. Do not twist trunk. Move body as a unit.   Lifting Principles .Maintain proper posture and head alignment. .Slide object as close as possible before lifting. .Move obstacles out of the way. .Test before lifting; ask for help if too heavy. .Tighten stomach muscles without holding breath. .Use smooth movements; do not jerk. .Use legs to do the work, and pivot with feet. .Distribute the work load symmetrically and close to the center of trunk. .Push instead of pull whenever possible.   Ask For Help   Ask for help and delegate to others when possible. Coordinate your movements when lifting together, and maintain the low back curve.  Log Roll   Lying on back, bend left knee  and place left arm across chest. Roll all in one movement to the right. Reverse to roll to the left. Always move as one unit. Housework - Sweeping  Use long-handled equipment to avoid stooping.   Housework - Wiping  Position yourself as close as possible to reach work surface. Avoid straining your back.  Laundry - Unloading Wash   To unload small items at bottom of washer, lift leg opposite to arm being used to reach.  [place] close to area to be raked. Use arm movements to do the work. Keep back straight and  avoid twisting.     Cart  When reaching into cart with one arm, lift opposite leg to keep back straight.   Getting Into / Out of Bed  Lower self to lie down on one side by raising legs and lowering head at the same time. Use arms to assist moving without twisting. Bend both knees to roll onto back if desired. To sit up, start from lying on side, and use same move-ments in reverse. Housework - Vacuuming  Hold the vacuum with arm held at side. Step back and forth to move it, keeping head up. Avoid twisting.   Laundry - IT consultant so that bending and twisting can be avoided.   Laundry - Unloading Dryer  Squat down to reach into clothes dryer or use a reacher.  Gardening - Weeding / Probation officer or Kneel. Knee pads may be helpful.

## 2015-02-20 DIAGNOSIS — H52223 Regular astigmatism, bilateral: Secondary | ICD-10-CM | POA: Diagnosis not present

## 2015-02-20 DIAGNOSIS — H4011X2 Primary open-angle glaucoma, moderate stage: Secondary | ICD-10-CM | POA: Diagnosis not present

## 2015-02-20 DIAGNOSIS — H35379 Puckering of macula, unspecified eye: Secondary | ICD-10-CM | POA: Diagnosis not present

## 2015-02-21 ENCOUNTER — Ambulatory Visit: Payer: Medicare Other | Admitting: Physical Therapy

## 2015-02-21 DIAGNOSIS — R262 Difficulty in walking, not elsewhere classified: Secondary | ICD-10-CM

## 2015-02-21 DIAGNOSIS — M2560 Stiffness of unspecified joint, not elsewhere classified: Secondary | ICD-10-CM

## 2015-02-21 DIAGNOSIS — M5416 Radiculopathy, lumbar region: Secondary | ICD-10-CM

## 2015-02-21 NOTE — Patient Instructions (Signed)
Review of abdominal setting and progression to hand to knee isometric 5 sec hold 5x right/left 1x/day.  Patient give handout.

## 2015-02-21 NOTE — Therapy (Signed)
[place], [place], [address] Phone: [phone]   Fax:  [phone]  Physical Therapy Treatment  Patient Details  Name: Juan Johnson MRN: [id number] Date of Birth: 1950/11/25 Referring Provider:  Florina Ou, MD  Encounter Date: 02/21/2015      PT End of Session - 02/21/15 1408    Visit Number 3   Number of Visits 16   Date for PT Re-Evaluation 04/06/15   Authorization Type Medicare Gcode   PT Start Time 1017   PT Stop Time 1110   PT Time Calculation (min) 55 min   Activity Tolerance Patient tolerated treatment well      Past Medical History  Diagnosis Date  . Hypertension   . Arthritis     Past Surgical History  Procedure Laterality Date  . Shoulder surgery  09/24/2011  . Eye surgery  10/2007  . Neck surgery  06/29/2010  . Tonsillectomy      AT AGE 8  . Leg surgery      AT AGE 70    There were no vitals filed for this visit.  Visit Diagnosis:  Chronic lumbar radiculopathy  Joint stiffness of spine  Difficulty in walking      Subjective Assessment - 02/21/15 1016    Subjective Been doing his exercises about 1x/day.  I've been avoiding picking up anything.  Doing better getting up off the floor.    Currently in Pain? Yes   Pain Score 3    Pain Location Back  left leg numbness   Pain Orientation Left   Pain Type Chronic pain   Pain Onset More than a month ago   Pain Frequency Intermittent   Aggravating Factors  lifting; push/pull; starting to pick up granddaughter   Pain Relieving Factors Tylenol; recliner                         [facility] Adult PT Treatment/Exercise - 02/21/15 1025    Lumbar Exercises: Stretches   Passive Hamstring Stretch 2 reps;30 seconds  with strap   Double Knee to Chest Stretch 5 reps  LEs on green ball   Lower Trunk Rotation 5 reps  with LEs on green ball   Lumbar Exercises: Supine   Ab Set 10 reps;5 seconds   Isometric Hip  Flexion 10 reps;5 seconds   Modalities   Modalities Cryotherapy   Cryotherapy   Number Minutes Cryotherapy 10 Minutes   Cryotherapy Location Back;Hip   Type of Cryotherapy Ice pack   Manual Therapy   Manual Therapy Joint mobilization   Joint Mobilization Right and left hip mobs grade 3 3x 30 sec:  subtalar distraction, long axis distraction, inferior mob; piriformis stretch on left 3x 30 sec                PT Education - 02/21/15 1408    Education provided Yes   Education Details Supine ab brace hand to knee push   Person(s) Educated Patient   Methods Explanation;Demonstration;Handout   Comprehension Verbalized understanding;Returned demonstration          PT Short Term Goals - 02/21/15 1413    PT SHORT TERM GOAL #1   Title Patient will demonstrate awareness of basic body mechanics and posture/positioning to decrease exacerbation of back and LE pain.   Time 4   Period Weeks   Status On-going   PT SHORT TERM GOAL #2   Title Patient will report a 25% reduction  in back and LE pain with basic ADLS.   Time 4   Period Weeks   Status On-going   PT SHORT TERM GOAL #3   Title Bilateral hip flexion improved to 95 degrees bilaterally and hip IR and ER to 20 degrees needed for greater ease with sit to stand and in/out of the car and on/off tractor.   Time 4   Period Weeks   Status On-going           PT Long Term Goals - 02/21/15 1502    PT LONG TERM GOAL #1   Title Patient will be independent in a progressive HEP for further strengthening and joint mobility needed for maintaining function and decreasing pain.   Time 8   Period Weeks   Status On-going   PT LONG TERM GOAL #2   Title Patient will have improved left hip abd and extension strength to 4/5 needed for improved standing tolerance and stability when ambulating.   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   Time 8   Period Weeks   Status On-going   PT LONG TERM GOAL #5   Title Patient will report an overall improvement in pain and function by 50%   Time 8   Period Weeks   Status On-going               Plan - 02/21/15 1409    Clinical Impression Statement Patient with less staggering noted today upon rising and less difficulty with word finding, expressive difficulties than previously noted.  Should begin meeting some STGs in next 2-3 visits.  He is very receptive to HEP progression and has his previous instructions in a binder for easy reference.  He needs verbal cueing to avoid holding his breath with abdominal setting exercises.  He reports his pain is no worse following mobility and strengthening exercises.  Patient more tolerant of supine positioning today.     PT Next Visit Plan Continue core strengthening; Spinal and LE mobility exercises; modalities as needed for pain control;  manual interventions        Problem List Patient Active Problem List   Diagnosis Date Noted  . COPD GOLD I 09/25/2014  . Upper airway cough syndrome 08/22/2014  . Essential hypertension 08/22/2014  . Diplopia 06/25/2014  . Vision loss, bilateral 06/25/2014  . Weakness 06/25/2014  . Unspecified hereditary and idiopathic peripheral neuropathy 06/25/2014    Alvera Singh 02/21/2015, 3:04 PM  [facility] [address] [place], [place], [address] Phone: [phone]   Fax:  [phone]   Ruben Im, PT 02/21/2015 3:05 PM Phone: [phone] Fax: [phone]

## 2015-02-24 ENCOUNTER — Ambulatory Visit: Payer: Medicare Other | Admitting: Physical Therapy

## 2015-02-24 DIAGNOSIS — M5416 Radiculopathy, lumbar region: Secondary | ICD-10-CM | POA: Diagnosis not present

## 2015-02-24 DIAGNOSIS — M2560 Stiffness of unspecified joint, not elsewhere classified: Secondary | ICD-10-CM

## 2015-02-24 DIAGNOSIS — R262 Difficulty in walking, not elsewhere classified: Secondary | ICD-10-CM

## 2015-02-24 NOTE — Therapy (Signed)
[place], [place], [address] Phone: [phone]   Fax:  [phone]  Physical Therapy Treatment  Patient Details  Name: Juan Johnson MRN: [id number] Date of Birth: March 17, 1951 Referring Provider:  Florina Ou, MD  Encounter Date: 02/24/2015      PT End of Session - 02/24/15 1136    Visit Number 4   Number of Visits 16   Date for PT Re-Evaluation 04/06/15   PT Start Time 1103   PT Stop Time 1146   PT Time Calculation (min) 43 min   Activity Tolerance Patient tolerated treatment well      Past Medical History  Diagnosis Date  . Hypertension   . Arthritis     Past Surgical History  Procedure Laterality Date  . Shoulder surgery  09/24/2011  . Eye surgery  10/2007  . Neck surgery  06/29/2010  . Tonsillectomy      AT AGE 86  . Leg surgery      AT AGE 31    There were no vitals filed for this visit.  Visit Diagnosis:  Chronic lumbar radiculopathy  Joint stiffness of spine  Difficulty in walking      Subjective Assessment - 02/24/15 1106    Subjective My back is aggravated from riding the mower.  From my ankle to my hip.    Currently in Pain? Yes   Pain Score 3    Pain Location Back   Pain Orientation Left   Pain Descriptors / Indicators Sore   Pain Type Chronic pain   Pain Onset More than a month ago                         [facility] Adult PT Treatment/Exercise - 02/24/15 1108    Lumbar Exercises: Stretches   Single Knee to Chest Stretch 3 reps;30 seconds   Lower Trunk Rotation 5 reps;10 seconds   Pelvic Tilt --  10 reps   Lumbar Exercises: Supine   Ab Set 5 reps;5 seconds   Other Supine Lumbar Exercises decompression LLE   Moist Heat Therapy   Number Minutes Moist Heat 10 Minutes   Moist Heat Location Lumbar Spine;Hip   Manual Therapy   Manual Therapy Myofascial release;Manual Traction   Joint Mobilization L hip    Myofascial Release L QL, paraspinals low  thoracic and lumbar in sidelying   Manual Traction LLE with varying levels of hip abd                PT Education - 02/24/15 1136    Education provided Yes   Education Details QL stretch, HEP   Person(s) Educated Patient   Methods Explanation;Demonstration;Handout   Comprehension Verbalized understanding;Returned demonstration          PT Short Term Goals - 02/21/15 1413    PT SHORT TERM GOAL #1   Title Patient will demonstrate awareness of basic body mechanics and posture/positioning to decrease exacerbation of back and LE pain.   Time 4   Period Weeks   Status On-going   PT SHORT TERM GOAL #2   Title Patient will report a 25% reduction in back and LE pain with basic ADLS.   Time 4   Period Weeks   Status On-going   PT SHORT TERM GOAL #3   Title Bilateral hip flexion improved to 95 degrees bilaterally and hip IR and ER to 20 degrees needed for greater ease with sit to stand and  in/out of the car and on/off tractor.   Time 4   Period Weeks   Status On-going           PT Long Term Goals - 02/21/15 1502    PT LONG TERM GOAL #1   Title Patient will be independent in a progressive HEP for further strengthening and joint mobility needed for maintaining function and decreasing pain.   Time 8   Period Weeks   Status On-going   PT LONG TERM GOAL #2   Title Patient will have improved left hip abd and extension strength to 4/5 needed for improved standing tolerance and stability when ambulating.   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   Time 8   Period Weeks   Status On-going   PT LONG TERM GOAL #5   Title  Patient will report an overall improvement in pain and function by 50%   Time 8   Period Weeks   Status On-going               Plan - 02/24/15 1137    Clinical Impression Statement Patient responded well to MFR and sidelying stretch/decompression type stretching.  Sore at L prox QL attachment and throughout.     PT Next Visit Plan Continue core strengthening; Spinal and LE mobility exercises; modalities as needed for pain control;  manual interventions, consider traction   PT Home Exercise Plan trunk rot, S KTC, A/P tilt, ab set and seated HS stretch, QL   Consulted and Agree with Plan of Care Patient        Problem List Patient Active Problem List   Diagnosis Date Noted  . COPD GOLD I 09/25/2014  . Upper airway cough syndrome 08/22/2014  . Essential hypertension 08/22/2014  . Diplopia 06/25/2014  . Vision loss, bilateral 06/25/2014  . Weakness 06/25/2014  . Unspecified hereditary and idiopathic peripheral neuropathy 06/25/2014    Mekhi Sonn 02/24/2015, 11:42 AM  [place], [place], [address] Phone: [phone]   Fax:  [phone]    Raeford Razor, PT 02/24/2015 11:42 AM Phone: [phone] Fax: [phone]

## 2015-02-24 NOTE — Patient Instructions (Signed)
Gave pt QL stretch handout

## 2015-02-28 ENCOUNTER — Ambulatory Visit: Payer: Medicare Other | Admitting: Physical Therapy

## 2015-02-28 DIAGNOSIS — M5416 Radiculopathy, lumbar region: Secondary | ICD-10-CM

## 2015-02-28 DIAGNOSIS — R262 Difficulty in walking, not elsewhere classified: Secondary | ICD-10-CM

## 2015-02-28 DIAGNOSIS — M2560 Stiffness of unspecified joint, not elsewhere classified: Secondary | ICD-10-CM

## 2015-02-28 NOTE — Patient Instructions (Signed)
Added quadruped stretch

## 2015-02-28 NOTE — Therapy (Signed)
[place], [place], [address] Phone: [phone]   Fax:  [phone]  Physical Therapy Treatment  Patient Details  Name: Juan Johnson MRN: [id number] Date of Birth: 1951/06/21 Referring Provider:  Florina Ou, MD  Encounter Date: 02/28/2015      PT End of Session - 02/28/15 1243    Visit Number 5   Number of Visits 16   Date for PT Re-Evaluation 04/06/15   PT Start Time 1100   PT Stop Time 1155   PT Time Calculation (min) 55 min   Activity Tolerance Patient tolerated treatment well      Past Medical History  Diagnosis Date  . Hypertension   . Arthritis     Past Surgical History  Procedure Laterality Date  . Shoulder surgery  09/24/2011  . Eye surgery  10/2007  . Neck surgery  06/29/2010  . Tonsillectomy      AT AGE 64  . Leg surgery      AT AGE 64    There were no vitals filed for this visit.  Visit Diagnosis:  Chronic lumbar radiculopathy  Joint stiffness of spine  Difficulty in walking      Subjective Assessment - 02/28/15 1116    Subjective LLE numb. The only time is isnt numb is when i wake up in the morning.  Last treatment helped for 1-2 hours.                         [facility] Adult PT Treatment/Exercise - 02/28/15 1117    Lumbar Exercises: Stretches   Quadruped Mid Back Stretch 3 reps;30 seconds   Quadruped Mid Back Stretch Limitations progressed to elongating each side x 2    Lumbar Exercises: Supine   Other Supine Lumbar Exercises sidelying L QL stretch for decompression of L lumbar   Cryotherapy   Number Minutes Cryotherapy 15 Minutes   Cryotherapy Location Lumbar Spine   Type of Cryotherapy Ice pack   Traction   Type of Traction Lumbar   Min (lbs) 50   Max (lbs) 80   Hold Time 60   Rest Time 15   Time 15   Manual Therapy   Manual Therapy Myofascial release;Manual Traction   Joint Mobilization L hip    Myofascial Release L QL, paraspinals  low thoracic and lumbar in sidelying   Manual Traction LLE with varying levels of hip abd     Prone compression adn decompression to SIJ, paraspinals, ice applied in prone           PT Education - 02/28/15 1241    Education provided Yes   Education Details traction, progress and criteria for continuing   Person(s) Educated Patient   Methods Explanation   Comprehension Verbalized understanding;Returned demonstration          PT Short Term Goals - 02/28/15 1251    PT SHORT TERM GOAL #1   Title Patient will demonstrate awareness of basic body mechanics and posture/positioning to decrease exacerbation of back and LE pain.   Status Achieved   PT SHORT TERM GOAL #2   Title Patient will report a 25% reduction in back and LE pain with basic ADLS.   Status Partially Met   PT SHORT TERM GOAL #3   Title Bilateral hip flexion improved to 95 degrees bilaterally and hip IR and ER to 20 degrees needed for greater ease with sit to stand and in/out of the car and  on/off tractor.   Status Unable to assess           PT Long Term Goals - 02/28/15 1252    PT LONG TERM GOAL #1   Title Patient will be independent in a progressive HEP for further strengthening and joint mobility needed for maintaining function and decreasing pain.   Status On-going   PT LONG TERM GOAL #2   Title Patient will have improved left hip abd and extension strength to 4/5 needed for improved standing tolerance and stability when ambulating.   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   Status Unable to assess   PT LONG TERM GOAL #5   Status On-going               Plan - 02/28/15 1243    Clinical  Impression Statement Pt felt relief after last visit, but only briefly.  Talked with patient about modifying normal activities/lifting.  Making slow gains, pt asking about when he will be able to lift and ride his horse.     PT Next Visit Plan assess traction, ask specific goals, cont with manual, traction if favored and develop deep core mm   PT Home Exercise Plan trunk rot, S KTC, A/P tilt, ab set and seated HS stretch, QL   Consulted and Agree with Plan of Care Patient        Problem List Patient Active Problem List   Diagnosis Date Noted  . COPD GOLD I 09/25/2014  . Upper airway cough syndrome 08/22/2014  . Essential hypertension 08/22/2014  . Diplopia 06/25/2014  . Vision loss, bilateral 06/25/2014  . Weakness 06/25/2014  . Unspecified hereditary and idiopathic peripheral neuropathy 06/25/2014    Madyn Ivins 02/28/2015, 12:54 PM  [place], [place], [address] Phone: [phone]   Fax:  [phone]

## 2015-03-02 ENCOUNTER — Ambulatory Visit: Payer: Medicare Other | Admitting: Physical Therapy

## 2015-03-02 DIAGNOSIS — M2560 Stiffness of unspecified joint, not elsewhere classified: Secondary | ICD-10-CM

## 2015-03-02 DIAGNOSIS — M5416 Radiculopathy, lumbar region: Secondary | ICD-10-CM | POA: Diagnosis not present

## 2015-03-02 DIAGNOSIS — R262 Difficulty in walking, not elsewhere classified: Secondary | ICD-10-CM

## 2015-03-02 NOTE — Therapy (Addendum)
[facility] [address] [place], [place], [address] Phone: [phone]   Fax:  [phone]  Physical Therapy Treatment  Patient Details  Name: ZEV BLUE MRN: [id number] Date of Birth: [birth year] Referring Provider:  Herb Grays, MD  Encounter Date: 03/02/2015      PT End of Session - 03/02/15 1155    Visit Number 6   Number of Visits 16   Date for PT Re-Evaluation 04/06/15   Authorization Type Medicare Gcode   PT Start Time 1103   PT Stop Time 1200   PT Time Calculation (min) 57 min   Activity Tolerance Patient limited by pain      Past Medical History  Diagnosis Date  . Hypertension   . Arthritis     Past Surgical History  Procedure Laterality Date  . Shoulder surgery  09/24/2011  . Eye surgery  10/2007  . Neck surgery  06/29/2010  . Tonsillectomy      AT AGE 64  . Leg surgery      AT AGE 64    There were no vitals filed for this visit.  Visit Diagnosis:  Chronic lumbar radiculopathy  Joint stiffness of spine  Difficulty in walking      Subjective Assessment - 03/02/15 1108    Subjective States he has done his exercises this morning.  States the (QL stretch) just hurts.  Unable to tell if traction helped or not.     Currently in Pain? Yes   Pain Score 3    Pain Location Back   Pain Orientation Left   Pain Descriptors / Indicators Sore   Pain Type Chronic pain   Aggravating Factors  lifting; getting out of bed   Pain Relieving Factors have beer                         [facility] Adult PT Treatment/Exercise - 03/02/15 1152    Exercises   Exercises Lumbar   Lumbar Exercises: Supine   Ab Set 5 reps   Bent Knee Raise 10 reps   Isometric Hip Flexion 10 reps;5 seconds   Lumbar Exercises: Sidelying   Clam 10 reps  right and left   Lumbar Exercises: Prone   Other Prone Lumbar Exercises Prone over 2 pillow with multifidi activation 5x discontinued secondary to increased pain   Cryotherapy   Number Minutes Cryotherapy 10 Minutes   Cryotherapy Location --  lumbar prone over 2 pillows   Type of Cryotherapy Ice pack   Manual Therapy   Myofascial Release Prone gentle soft tissue mobilization to bilateral paraspinals and QL;  patient very tender left side vs right                PT Education - 03/02/15 1151    Education provided Yes   Education Details abdominal brace with bent knee lift 90 degrees, clams   Person(s) Educated Patient   Methods Explanation;Demonstration;Handout   Comprehension Verbalized understanding;Returned demonstration          PT Short Term Goals - 03/02/15 1211    PT SHORT TERM GOAL #1   Title Patient will demonstrate awareness of basic body mechanics and posture/positioning to decrease exacerbation of back and LE pain.   Status Achieved   PT SHORT TERM GOAL #2   Title Patient will report a 25% reduction in back and LE pain with basic ADLS.   Time 4   Period Weeks   Status Partially Met  PT SHORT TERM GOAL #3   Title Bilateral hip flexion improved to 95 degrees bilaterally and hip IR and ER to 20 degrees needed for greater ease with sit to stand and in/out of the car and on/off tractor.   Baseline Bilateral hip flexion 95 degrees; left ER limited   Time 4   Period Weeks   Status Partially Met           PT Long Term Goals - 03/02/15 1212    PT LONG TERM GOAL #1   Title Patient will be independent in a progressive HEP for further strengthening and joint mobility needed for maintaining function and decreasing pain.   Time 8   Period Weeks   Status On-going   PT LONG TERM GOAL #2   Title Patient will have improved left hip abd and extension strength to 4/5 needed for improved standing tolerance and stability when ambulating.   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   Time 8   Period Weeks   Status On-going   PT LONG TERM GOAL #5   Title Patient will report an overall improvement in pain and function by 50%   Time 8   Period Weeks   Status On-going               Plan - 03/02/15 1204    Clinical Impression Statement Patient reports being unsure if he had relief from mechanical traction so opted to hold for today but resume as needed in subsequent appointments.  Review of HEP with verbal cues needed to avoid holding breath with transverse abdominus activation.  Progressed in level of difficulty but lacks strength for single leg lowering.  Therapist also monitoring pain and modifying exercises as needed (prone multifidi activation painful.)  Difficulty rising from prone over 2 pillow position.  No additonal progress toward goals.   PT Next Visit Plan resume traction if needed; possible progression to standing core strengthening;  manual techniques and cryotherapy if helpful for pain reduction        Problem List Patient Active Problem List   Diagnosis Date Noted  . COPD GOLD I 09/25/2014  . Upper airway cough syndrome 08/22/2014  . Essential hypertension 08/22/2014  . Diplopia 06/25/2014  . Vision loss, bilateral 06/25/2014  . Weakness 06/25/2014  . Unspecified hereditary and idiopathic peripheral neuropathy 06/25/2014    Alvera Singh 03/02/2015, 12:15 PM  [facility] [address] [place], [place], [address] Phone: [phone]   Fax:  [place], [place] 03/02/2015 12:16 PM Phone: [phone] Fax: [phone]   PHYSICAL THERAPY DISCHARGE SUMMARY  Visits from Start of Care: 6  Current functional level related to goals / functional outcomes: See  above   Remaining deficits: Pain in back, hip/trunk AROM and weakness   Education / Equipment: Posture, lifting, HEP  Plan: Patient agrees to discharge.  Patient goals were partially met. Patient is being discharged due to not returning since the last visit.  ?????    Raeford Razor, PT 08/22/2015 9:20 AM Phone: [phone]  Fax: [phone]

## 2015-03-02 NOTE — Patient Instructions (Addendum)
Supine abdominal brace with hip/knee lift to 90 degrees 5-8 reps 1x/day.  Abduction: Clam (Eccentric) - Side-Lying   Lie on side with knees bent. Lift top knee, keeping feet together. Keep trunk steady. Slowly lower for 3-5 seconds. __15_ reps per set, 1___ sets per day, _5-7__ days per week. Add ___ lbs when you achieve ___ repetitions.  Copyright  VHI. All rights reserved.

## 2015-03-07 ENCOUNTER — Encounter: Payer: Medicare Other | Admitting: Physical Therapy

## 2015-03-09 ENCOUNTER — Encounter: Payer: Medicare Other | Admitting: Physical Therapy

## 2015-03-09 DIAGNOSIS — J302 Other seasonal allergic rhinitis: Secondary | ICD-10-CM | POA: Diagnosis not present

## 2015-03-09 DIAGNOSIS — R05 Cough: Secondary | ICD-10-CM | POA: Diagnosis not present

## 2015-03-09 DIAGNOSIS — J4531 Mild persistent asthma with (acute) exacerbation: Secondary | ICD-10-CM | POA: Diagnosis not present

## 2015-03-09 DIAGNOSIS — E785 Hyperlipidemia, unspecified: Secondary | ICD-10-CM | POA: Diagnosis not present

## 2015-03-09 DIAGNOSIS — Z131 Encounter for screening for diabetes mellitus: Secondary | ICD-10-CM | POA: Diagnosis not present

## 2015-03-09 DIAGNOSIS — I10 Essential (primary) hypertension: Secondary | ICD-10-CM | POA: Diagnosis not present

## 2015-03-09 DIAGNOSIS — K219 Gastro-esophageal reflux disease without esophagitis: Secondary | ICD-10-CM | POA: Diagnosis not present

## 2015-03-09 DIAGNOSIS — Z0000 Encounter for general adult medical examination without abnormal findings: Secondary | ICD-10-CM | POA: Diagnosis not present

## 2015-03-09 DIAGNOSIS — J4530 Mild persistent asthma, uncomplicated: Secondary | ICD-10-CM | POA: Diagnosis not present

## 2015-03-10 ENCOUNTER — Telehealth: Payer: Self-pay | Admitting: Neurology

## 2015-03-10 NOTE — Telephone Encounter (Signed)
I called and spoke to [place], [place] and relayed from last notes, pt is on aggrenox.  She then asked about- ok for pt taking augmentin and prednisone for COPD exacerbation.  She was not familiar with aggrenox .  Plus pt stating he was told not to.    I relayed from notes that he did have bleeding problem in January.  (and celebrex, and mobic stopped).  She thought that message sent to [facility].   I would forward.  She is there till 1600.

## 2015-03-10 NOTE — Telephone Encounter (Signed)
I called Juan Johnson. No issue to treat his COPD exacerbation with abx + prednisone.    -VRP

## 2015-03-10 NOTE — Telephone Encounter (Signed)
Ninfa Linden, PA from [facility] in [place] called wanting to get clarification to see if the patient is taking PLAVIX and/or dipyridamole-aspirin (AGGRENOX) 200-25 MG per 12 hr capsule or both. Please call and advise. Ninfa Linden, PA can be reached @ [phone] (if she is in with a patient request that she gets pulled out of the room so she can get the clarification)

## 2015-03-14 ENCOUNTER — Ambulatory Visit: Payer: Medicare Other | Admitting: Physical Therapy

## 2015-03-16 ENCOUNTER — Ambulatory Visit: Payer: Medicare Other | Admitting: Physical Therapy

## 2015-03-16 ENCOUNTER — Encounter: Payer: Self-pay | Admitting: Neurology

## 2015-03-16 ENCOUNTER — Ambulatory Visit (INDEPENDENT_AMBULATORY_CARE_PROVIDER_SITE_OTHER): Payer: Medicare Other | Admitting: Neurology

## 2015-03-16 VITALS — BP 123/67 | HR 60 | Temp 98.0°F | Ht 69.0 in | Wt 217.2 lb

## 2015-03-16 DIAGNOSIS — G4719 Other hypersomnia: Secondary | ICD-10-CM

## 2015-03-16 DIAGNOSIS — R0681 Apnea, not elsewhere classified: Secondary | ICD-10-CM

## 2015-03-16 DIAGNOSIS — R0683 Snoring: Secondary | ICD-10-CM

## 2015-03-16 DIAGNOSIS — I639 Cerebral infarction, unspecified: Secondary | ICD-10-CM

## 2015-03-16 DIAGNOSIS — E669 Obesity, unspecified: Secondary | ICD-10-CM

## 2015-03-16 DIAGNOSIS — R519 Headache, unspecified: Secondary | ICD-10-CM

## 2015-03-16 DIAGNOSIS — R51 Headache: Secondary | ICD-10-CM

## 2015-03-16 MED ORDER — ASPIRIN-DIPYRIDAMOLE ER 25-200 MG PO CP12: 1.0000 | ORAL_CAPSULE | Freq: Two times a day (BID) | ORAL | Status: DC
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
## 2015-03-16 NOTE — Patient Instructions (Signed)
Overall you are doing fairly well but I do want to suggest a few things today:   Remember to drink plenty of fluid, eat healthy meals and do not skip any meals. Try to eat protein with a every meal and eat a healthy snack such as fruit or nuts in between meals. Try to keep a regular sleep-wake schedule and try to exercise daily, particularly in the form of walking, 20-30 minutes a day, if you can.   As far as your medications are concerned, I would like to suggest: Continue current medications  As far as diagnostic testing: Sleep study  I would like to see you back in 6 months, sooner if we need to. Please call us with any interim questions, concerns, problems, updates or refill requests.   Please also call us for any test results so we can go over those with you on the phone.  My clinical assistant and will answer any of your questions and relay your messages to me and also relay most of my messages to you.   Our phone number is [phone]. We also have an after hours call service for urgent matters and there is a physician on-call for urgent questions. For any emergencies you know to call 911 or go to the nearest emergency room

## 2015-03-17 ENCOUNTER — Ambulatory Visit: Payer: Self-pay | Admitting: Neurology

## 2015-03-23 ENCOUNTER — Telehealth: Payer: Self-pay | Admitting: Physical Therapy

## 2015-03-23 NOTE — Telephone Encounter (Signed)
Called patient to see if he was interested in continuing PT or Discharge.  Asked him to return my call.

## 2015-04-11 ENCOUNTER — Encounter: Payer: Self-pay | Admitting: Internal Medicine

## 2015-04-11 ENCOUNTER — Ambulatory Visit (INDEPENDENT_AMBULATORY_CARE_PROVIDER_SITE_OTHER): Payer: Medicare Other | Admitting: Internal Medicine

## 2015-04-11 VITALS — BP 110/56 | HR 65 | Ht 69.0 in | Wt 217.0 lb

## 2015-04-11 DIAGNOSIS — R05 Cough: Secondary | ICD-10-CM

## 2015-04-11 DIAGNOSIS — J449 Chronic obstructive pulmonary disease, unspecified: Secondary | ICD-10-CM | POA: Diagnosis not present

## 2015-04-11 DIAGNOSIS — I639 Cerebral infarction, unspecified: Secondary | ICD-10-CM | POA: Diagnosis not present

## 2015-04-11 DIAGNOSIS — R058 Other specified cough: Secondary | ICD-10-CM

## 2015-04-11 MED ORDER — PREDNISONE 10 MG PO TABS: ORAL_TABLET | ORAL | Status: DC

## 2015-04-11 MED ORDER — MOMETASONE FURO-FORMOTEROL FUM 100-5 MCG/ACT IN AERO: INHALATION_SPRAY | RESPIRATORY_TRACT | Status: DC

## 2015-04-11 MED ORDER — ALBUTEROL SULFATE HFA 108 (90 BASE) MCG/ACT IN AERS: 2.0000 | INHALATION_SPRAY | Freq: Four times a day (QID) | RESPIRATORY_TRACT | Status: DC | PRN

## 2015-04-11 NOTE — Progress Notes (Signed)
Subjective:   Patient ID: Juan Johnson, male    DOB: [birth year]   MRN: [id number]    Brief patient profile:  6 yowm quit smoking around 1995 with h/o "bronchial troubles" all the way back to childhood with bad short flares of cough with no meds in between with no need for any daily therapy but starting summer 2015 bad cough fits seem much  better with depomedrol  But then relapse within a few days back coughing again so referred by Juan Johnson 08/22/2014 having failed to improve on flovent max strength with mild airflow obst by pfts 09/23/14 c/w GOLD I copd    History of Present Illness  08/22/2014 1st Juan Johnson   Chief Complaint  Patient presents with  . Pulmonary Consult    Referred per Juan Johnson.  Pt states dxed with COPD approx 2-3 yrs ago. Pt c/o SOB and cough on and off for the past 6 months.  He states does okay walking from his house to his barn and wlaking any distance at an incline. Cough is prod with minimal yellow to white sputum.  He also c/o CP that comes and goes when he coughs.   usually able to walk from barn to house ok but not since onset of this new pattern of day time cough in summer of 2015 No noct or early am cough or sign excess mucus production  rec Depodrol  120 mg today Stop lisinopril and flovent  Change prilosec to 20 mg Take 30- 60 min before your first and last meals of the day  GERD Only use your albuterol as a rescue medication prn   09/22/2014 f/u ov/Juan Johnson re: copd GOLD I/ symptoms better off acei  Chief Complaint  Patient presents with  . Follow-up    Pt states that his cough and SOB are much better. No new co's today.    Not limited by breathing from desired activities  / not needing any saba at all rec F/u prn    04/11/2015  Acute  ov/Juan Johnson re: recurrent cough / GOLD I copd  Chief Complaint  Patient presents with  . Follow-up    Pt states he got a cold in May and his cough has been bothering him since then. Cough is  occ prod with clear sputum.     Fine until May after started working around hay and since then  Cough back again  day > noct rx zyrtec and singular and symbicort 160 (though poor technique)  and shot of cortisone transiently improved   No obvious day to day or daytime variabilty or assoc excess or purulent sputum or cp or chest tightness, subjective wheeze overt sinus or hb symptoms. No unusual exp hx or h/o childhood pna/ asthma or knowledge of premature birth.  Sleeping ok without nocturnal  or early am exacerbation  of respiratory  c/o's or need for noct saba. Also denies any obvious fluctuation of symptoms with weather or environmental changes or other aggravating or alleviating factors except as outlined above   Current Medications, Allergies, Complete Past Medical History, Past Surgical History, Family History, and Social History were reviewed in [employer] record.  ROS  The following are not active complaints unless bolded sore throat, dysphagia, dental problems, itching, sneezing,  nasal congestion or excess/ purulent secretions, ear ache,   fever, chills, sweats, unintended wt loss, pleuritic or exertional cp, hemoptysis,  orthopnea pnd or leg swelling, presyncope, palpitations, heartburn, abdominal pain, anorexia,  nausea, vomiting, diarrhea  or change in bowel or urinary habits, change in stools or urine, dysuria,hematuria,  rash, arthralgias, visual complaints, headache, numbness weakness or ataxia or problems with walking or coordination,  change in mood/affect or memory.             Objective:   Physical Exam   Pleasant hoarse amb wm nad  -  Very shaky on details of care/ symptoms/ meds   09/22/2014     224 >  04/11/2015  217  Wt Readings from Last 3 Encounters:  08/22/14 226 lb (102.513 kg)  06/24/14 225 lb 12.8 oz (102.422 kg)  10/12/12 215 lb (97.523 kg)    Vital signs reviewed    HEENT: nl dentition, turbinates, and orophanx. Nl external ear  canals without cough reflex   NECK :  without JVD/Nodes/TM/ nl carotid upstrokes bilaterally   LUNGS: no acc muscle use, clear to A and P bilaterally with cough on insp  - does have pseudowheeze only  with fvc> much  better    CV:  RRR  no s3 or murmur or increase in P2, no edema   ABD:  soft and nontender with nl excursion in the supine position. No bruits or organomegaly, bowel sounds nl  MS:  warm without deformities, calf tenderness, cyanosis or clubbing  SKIN: warm and dry without lesions    NEURO:  alert, approp, no deficits    cxr 08/05/14 fine per pt and wife      Assessment & Plan:

## 2015-04-11 NOTE — Patient Instructions (Addendum)
Stop symbiocort  Try dulera 100 Take 2 puffs first thing in am and then another 2 puffs about 12 hours later - if better fill prescription-  if not see me back with all medications  Only use your albuterol as a rescue medication to be used if you can't catch your breath by resting or doing a relaxed purse lip breathing pattern.  - The less you use it, the better it will work when you need it. - Ok to use up to 2 puffs  every 4 hours if you must but call for immediate appointment if use goes up over your usual need - Don't leave home without it !!  (think of it like the spare tire for your car)   Prednisone 10 mg take  4 each am x 2 days,   2 each am x 2 days,  1 each am x 2 days and stop   prilosec 20 mg x 2  X 30 min Take 30- 60 min before your first and last meals of the day   GERD (REFLUX)  is an extremely common cause of respiratory symptoms just like yours , many times with no obvious heartburn at all.    It can be treated with medication, but also with lifestyle changes including elevation of the head of your bed (ideally with 6 inch  bed blocks),  Smoking cessation, avoidance of late meals, excessive alcohol, and avoid fatty foods, chocolate, peppermint, colas, red wine, and acidic juices such as orange juice.  NO MINT OR MENTHOL PRODUCTS SO NO COUGH DROPS  USE SUGARLESS CANDY INSTEAD (Jolley ranchers or Stover's or Life Savers) or even ice chips will also do - the key is to swallow to prevent all throat clearing. NO OIL BASED VITAMINS - use powdered substitutes.    Ask your eye doctor for to replace the timoptic on a trial basis

## 2015-04-13 ENCOUNTER — Encounter: Payer: Self-pay | Admitting: Neurology

## 2015-04-13 ENCOUNTER — Ambulatory Visit (INDEPENDENT_AMBULATORY_CARE_PROVIDER_SITE_OTHER): Payer: Medicare Other | Admitting: Neurology

## 2015-04-13 VITALS — BP 138/70 | HR 62 | Resp 16 | Ht 69.0 in | Wt 213.0 lb

## 2015-04-13 DIAGNOSIS — R351 Nocturia: Secondary | ICD-10-CM

## 2015-04-13 DIAGNOSIS — G4733 Obstructive sleep apnea (adult) (pediatric): Secondary | ICD-10-CM

## 2015-04-13 DIAGNOSIS — R519 Headache, unspecified: Secondary | ICD-10-CM

## 2015-04-13 DIAGNOSIS — R51 Headache: Secondary | ICD-10-CM

## 2015-04-13 DIAGNOSIS — I639 Cerebral infarction, unspecified: Secondary | ICD-10-CM | POA: Diagnosis not present

## 2015-04-13 DIAGNOSIS — G4719 Other hypersomnia: Secondary | ICD-10-CM | POA: Diagnosis not present

## 2015-04-13 NOTE — Progress Notes (Signed)
Subjective:    Patient ID: Juan Johnson is a 64 y.o. male.  HPI     Juan Age, MD, PhD [facility] [address] [place], [place] [address]  Dear Juan Johnson,   I saw your patient, Juan Johnson Juan Johnson"), upon your kind request in my clinic today for initial consultation of his sleep disorder, in particular, concern for underlying obstructive sleep apnea. The patient is accompanied by his wife, Juan Johnson, today. As you know, Juan Johnson is a 64 year old right-handed gentleman with a medical history of COPD, hypertension, glaucoma, stroke, reflux disease and obesity, who reports snoring, excessive daytime somnolence, witnessed apneas and morning headaches. I reviewed your office note from 03/16/2015. For his lung disease he has seen Dr. Melvyn Johnson recently, I reviewed his office note from 04/11/2015 as well. His Symbicort was stopped and he was started on Juan Johnson. His Prilosec was also increased. His Epworth sleepiness score is 24 out of 24 today. His fatigue score is 51 out of 63 today. He goes to bed between 10 and 11 typically. Falling asleep is not a problem. He does watch TV in bed and sometimes turns it off in the middle of the night when he wakes up. He has nocturia once per night on average. He wakes up with a headache 3-4 times per week on average. This is a dull typically all over headache. He drinks green tea throughout the day. He has reduced his soda intake. He quit smoking 28 years ago. He drinks alcohol in the form of beer, wine or 2 beers, 3-4 times a week. He had a tonsillectomy at Johnson 75. He has a family history of obstructive sleep apnea in his older brother who uses a CPAP machine. He denies restless leg symptoms but has occasional leg cramps at night. It depends on how active he has been during the day. He does not typically exercise but [place]. His wake time is usually variable, anywhere between 5 and 8 AM. His wife sleeps in a  separate bedroom because he is a restless sleeper and snores heavily at times. He also makes gasping sounds and choking sounds in his sleep.  His Past Medical History Is Significant For: Past Medical History  Diagnosis Date  . Hypertension   . Arthritis     Her Past Surgical History Is Significant For: Past Surgical History  Procedure Laterality Date  . Shoulder surgery  09/24/2011  . Eye surgery  10/2007  . Neck surgery  06/29/2010  . Tonsillectomy      AT [place] 33  . Leg surgery      AT Johnson 80    His Family History Is Significant For: Family History  Problem Relation Johnson of Onset  . Diabetes    . Hypothyroidism    . Hypertension Mother   . Cancer    . Diabetes type II Brother   . Diabetes type II Brother   . Hyperthyroidism    . Cataracts    . Glaucoma    . Macular degeneration Mother   . Stroke Father   . Emphysema Father     smoked    His Social History Is Significant For: History   Social History  . Marital Status: Married    Spouse Name: N/A  . Number of Children: 2  . Years of Education: Bachelors   Occupational History  . Retired     Social History Main Topics  . Smoking status: Former Smoker --  3.00 packs/day for 23 years    Types: Cigarettes    Quit date: 10/12/1997  . Smokeless tobacco: Never Used  . Alcohol Use: No     Comment: OCC  . Drug Use: No  . Sexual Activity: Not on file   Other Topics Concern  . None   Social History Narrative   Patient is married with 2 children.   Patient is right handed.   Patient has a Bachelor's degree.   Patient drinks 2-3 caffeine drinks daily.    His Allergies Are:  Allergies  Allergen Reactions  . Crestor [Rosuvastatin] Other (See Comments)    Cramps and muscle weakness  . Simvastatin Other (See Comments)    Cramps and muscle weakness  :   His Current Medications Are:  Outpatient Encounter Prescriptions as of 04/13/2015  Medication Sig  . albuterol (PROAIR HFA) 108 (90 BASE) MCG/ACT inhaler  Inhale 2 puffs into the lungs every 6 (six) hours as needed for wheezing or shortness of breath.  Marland Kitchen aspirin 81 MG tablet Take 81 mg by mouth daily.  . Cetirizine HCl (ZYRTEC PO) Take 1 tablet by mouth daily.  Marland Kitchen ezetimibe (ZETIA) 10 MG tablet Take 10 mg by mouth daily.  Marland Kitchen ketorolac (ACULAR) 0.5 % ophthalmic solution   . latanoprost (XALATAN) 0.005 % ophthalmic solution Place 1 drop into both eyes at bedtime.   . montelukast (SINGULAIR) 10 MG tablet Take 10 mg by mouth daily.  . Multiple Vitamins-Minerals (MULTIVITAMIN WITH MINERALS) tablet Take 1 tablet by mouth daily.  . niacin (NIASPAN) 1000 MG CR tablet Take 1,000 mg by mouth daily.  Marland Kitchen omeprazole (PRILOSEC) 20 MG capsule   . timolol (TIMOPTIC) 0.5 % ophthalmic solution Place 1 drop into the left eye 2 (two) times daily.   . valsartan-hydrochlorothiazide (DIOVAN HCT) 160-12.5 MG per tablet Take 1 tablet by mouth daily.  . [DISCONTINUED] mometasone-formoterol (DULERA) 100-5 MCG/ACT AERO Take 2 puffs first thing in am and then another 2 puffs about 12 hours later.  . [DISCONTINUED] acetaminophen (TYLENOL) 325 MG tablet Take 2 tablets (650 mg total) by mouth every 6 (six) hours as needed for moderate pain.  . [DISCONTINUED] dipyridamole-aspirin (AGGRENOX) 200-25 MG per 12 hr capsule Take 1 capsule by mouth 2 (two) times daily.  . [DISCONTINUED] predniSONE (DELTASONE) 10 MG tablet Take  4 each am x 2 days,   2 each am x 2 days,  1 each am x 2 days and stop   No facility-administered encounter medications on file as of 04/13/2015.  :  Review of Systems:  Out of a complete 14 point review of systems, all are reviewed and negative with the exception of these symptoms as listed below:   Review of Systems  Respiratory: Positive for cough and wheezing.   Musculoskeletal:       Cramps  Allergic/Immunologic: Positive for environmental allergies.  Neurological:       Confusion, snoring, no trouble falling asleep, trouble staying asleep, witnessed  apnea, wakes up feeling tired in morning, morning headaches, falls asleep when sitting.     Objective:  Neurologic Exam  Physical Exam Physical Examination:   Filed Vitals:   04/13/15 1014  BP: 138/70  Pulse: 62  Resp: 16    General Examination: The patient is a very pleasant 64 y.o. male in no acute distress. He appears well-developed and well-nourished and well groomed. He is obese.  HEENT: Normocephalic, atraumatic, pupils are unequal, left pupil is irregular. He is status post bilateral cataract repairs. He  has visual impairment, right more than left. Funduscopic exam is normal with Heberle disc margins noted. Extraocular tracking is good without limitation to gaze excursion or nystagmus noted. Normal smooth pursuit is noted. Hearing is grossly intact. Tympanic membranes are clear bilaterally. Face is symmetric with normal facial animation and normal facial sensation. Speech is clear with no dysarthria noted. There is no hypophonia. There is no lip, neck/head, jaw or voice tremor. Neck is supple with full range of passive and active motion. There are no carotid bruits on auscultation. Oropharynx exam reveals: mild mouth dryness, adequate dental hygiene and moderate airway crowding, due to longer tongue, longer uvula, narrow airway entry.. Mallampati is class II. Tongue protrudes centrally and palate elevates symmetrically. Tonsils are absent. Neck size is 17.75 inches. He has a Mild overbite. Nasal inspection reveals no significant nasal mucosal bogginess or redness and no septal deviation.   Chest: Clear to auscultation without wheezing, rhonchi or crackles noted.  Heart: S1+S2+0, regular and normal without murmurs, rubs or gallops noted.   Abdomen: Soft, non-tender and non-distended with normal bowel sounds appreciated on auscultation.  Extremities: There is no pitting edema in the distal lower extremities bilaterally. Pedal pulses are intact.  Skin: Warm and dry without trophic  changes noted. There are mild varicose veins.  Musculoskeletal: exam reveals no obvious joint deformities, tenderness or joint swelling or erythema.   Neurologically:  Mental status: The patient is awake, alert and oriented in all 4 spheres. His immediate and remote memory, attention, language skills and fund of knowledge are appropriate. There is no evidence of aphasia, agnosia, apraxia or anomia. Speech is clear with normal prosody and enunciation. Thought process is linear. Mood is normal and affect is normal.  Cranial nerves II - XII are as described above under HEENT exam. In addition: shoulder shrug is normal with equal shoulder height noted. Motor exam: Normal bulk, strength and tone is noted. There is no drift, tremor or rebound. Romberg is negative. Reflexes are 2+ throughout. Fine motor skills and coordination: intact with normal finger taps, normal hand movements, normal rapid alternating patting, normal foot taps and normal foot agility.  Cerebellar testing: No dysmetria or intention tremor on finger to nose testing. Heel to shin is unremarkable bilaterally. There is no truncal or gait ataxia.  Sensory exam: intact to light touch.  Gait, station and balance: He stands easily. No veering to one side is noted. No leaning to one side is noted. Posture is Johnson-appropriate and stance is narrow based. Gait shows normal stride length and normal pace. No problems turning are noted. He turns en bloc. Tandem walk is difficult for him.  Assessment and Plan:   In summary, ZAKEE DEERMAN is a very pleasant 64 y.o.-year old male with a medical history of COPD, hypertension, glaucoma, stroke, reflux disease and obesity, whose history and physical exam are in keeping with obstructive sleep apnea (OSA). I had a long chat with the patient and his wife  about my findings and the diagnosis of OSA, its prognosis and treatment options. We talked about medical treatments, surgical interventions and  non-pharmacological approaches. I explained in particular the risks and ramifications of untreated moderate to severe OSA, especially with respect to developing cardiovascular disease down the Road, including congestive heart failure, difficult to treat hypertension, cardiac arrhythmias, or stroke. Even type 2 diabetes has, in part, been linked to untreated OSA. Symptoms of untreated OSA include daytime sleepiness, memory problems, mood irritability and mood disorder such as depression and anxiety,  lack of energy, as well as recurrent headaches, especially morning headaches. We talked about trying to maintain a healthy lifestyle in general, as well as the importance of weight control. I encouraged the patient to eat healthy, exercise daily and keep well hydrated, to keep a scheduled bedtime and wake time routine, to not skip any meals and eat healthy snacks in between meals. I advised the patient not to drive when feeling sleepy. I recommended the following at this time: sleep study with potential positive airway pressure titration. (We will score hypopneas at 4% and split the sleep study into diagnostic and treatment portion, if the estimated. 2 hour AHI is >15/h).   I explained the sleep test procedure to the patient and also outlined possible surgical and non-surgical treatment options of OSA, including the use of a custom-made dental device (which would require a referral to a specialist dentist or oral surgeon), upper airway surgical options, such as pillar implants, radiofrequency surgery, tongue base surgery, and UPPP (which would involve a referral to an ENT surgeon). Rarely, jaw surgery such as mandibular advancement may be considered.  I also explained the CPAP treatment option to the patient, who indicated that he would be willing to try CPAP if the need arises. I explained the importance of being compliant with PAP treatment, not only for insurance purposes but primarily to improve His symptoms, and  for the patient's long term health benefit, including to reduce His cardiovascular risks. I answered all their questions today and the patient and Juan Johnson were in agreement. I would like to see him back after the sleep study is completed and encouraged him to call with any interim questions, concerns, problems or updates.   Thank you very much for allowing me to participate in the care of this nice patient. If I can be of any further assistance to you please do not hesitate to talk to me.  Sincerely,   Juan Age, MD, PhD

## 2015-04-13 NOTE — Patient Instructions (Addendum)

## 2015-04-14 DIAGNOSIS — J189 Pneumonia, unspecified organism: Secondary | ICD-10-CM

## 2015-04-14 HISTORY — DX: Pneumonia, unspecified organism: J18.9

## 2015-04-16 ENCOUNTER — Encounter: Payer: Self-pay | Admitting: Internal Medicine

## 2015-04-16 MED ORDER — MOMETASONE FURO-FORMOTEROL FUM 100-5 MCG/ACT IN AERO: INHALATION_SPRAY | RESPIRATORY_TRACT | Status: DC

## 2015-04-16 NOTE — Assessment & Plan Note (Addendum)
PFts 09/22/14 FEV1  2.64 (80%) ratio 67 and no change p B2 with dlco 82%   The proper method of use, as well as anticipated side effects, of a metered-dose inhaler are discussed and demonstrated to the patient. Improved effectiveness after extensive coaching during this visit to a level of approximately  75%   Really as very little evidence of airflow obst but could have an element of cough variant asthma > see sep a/p

## 2015-04-16 NOTE — Assessment & Plan Note (Signed)
Trial off acei and flovent 08/22/2014 >> much better 09/22/14   Clearly relapsed with recurrent cough ? Etiology  The most common causes of chronic cough in immunocompetent adults include the following: upper airway cough syndrome (UACS), previously referred to as postnasal drip syndrome (PNDS), which is caused by variety of rhinosinus conditions; (2) asthma; (3) GERD; (4) chronic bronchitis from cigarette smoking or other inhaled environmental irritants; (5) nonasthmatic eosinophilic bronchitis; and (6) bronchiectasis.   These conditions, singly or in combination, have accounted for up to 94% of the causes of chronic cough in prospective studies.   Other conditions have constituted no >6% of the causes in prospective studies These have included bronchogenic carcinoma, chronic interstitial pneumonia, sarcoidosis, left ventricular failure, ACEI-induced cough, and aspiration from a condition associated with pharyngeal dysfunction.    Chronic cough is often simultaneously caused by more than one condition. A single cause has been found from 38 to 82% of the time, multiple causes from 18 to 62%. Multiply caused cough has been the result of three diseases up to 42% of the time.       Based on hx and exam, this is most likely:  Classic Upper airway cough syndrome, so named because it's frequently impossible to sort out how much is  CR/sinusitis with freq throat clearing (which can be related to primary GERD)   vs  causing  secondary (" extra esophageal")  GERD from wide swings in gastric pressure that occur with throat clearing, often  promoting self use of mint and menthol lozenges that reduce the lower esophageal sphincter tone and exacerbate the problem further in a cyclical fashion.   These are the same pts (now being labeled as having "irritable larynx syndrome" by some cough centers) who not infrequently have a history of having failed to tolerate ace inhibitors,  dry powder inhalers or biphosphonates  or report having atypical reflux symptoms that don't respond to standard doses of PPI , and are easily confused as having aecopd or asthma flares by even experienced allergists/ pulmonologists.   The first step is to maximize acid suppression and  Try dulera 100 instead of symb 160 to see if lower ics works better, if so favors more uacs than asthma, if not need to regroup .  I had an extended discussion with the patient reviewing all relevant studies completed to date and  lasting 15 to 20 minutes of a 25 minute visit on the following ongoing concerns:   1) The standardized cough guidelines published in Chest by Lissa Morales in 2006 are still the best available and consist of a multiple step process (up to 12!) , not a single office visit,  and are intended  to address this problem logically,  with an alogrithm dependent on response to empiric treatment at  each progressive step  to determine a specific diagnosis with  minimal addtional testing needed. Therefore if adherence is an issue or can't be accurately verified,  it's very unlikely the standard evaluation and treatment will be successful here.    Furthermore, response to therapy (other than acute cough suppression, which should only be used short term with avoidance of narcotic containing cough syrups if possible), can be a gradual process for which the patient may perceive immediate benefit.  Unlike going to an eye doctor where the best perscription is almost always the first one and is immediately effective, this is almost never the case in the management of chronic cough syndromes. Therefore the patient needs to commit up  front to consistently adhere to recommendations  for up to 6 weeks of therapy directed at the likely underlying problem(s) before the response can be reasonably evaluated.   2) timoptic may be causing airways instability > prefer betoptic in this setting   3) Each maintenance medication was reviewed in detail including  most importantly the difference between maintenance and as needed and under what circumstances the prns are to be used.  Please see instructions for details which were reviewed in writing and the patient given a copy.

## 2015-04-26 ENCOUNTER — Ambulatory Visit (INDEPENDENT_AMBULATORY_CARE_PROVIDER_SITE_OTHER): Payer: Medicare Other | Admitting: Neurology

## 2015-04-26 VITALS — BP 121/71 | HR 78

## 2015-04-26 DIAGNOSIS — G4733 Obstructive sleep apnea (adult) (pediatric): Secondary | ICD-10-CM | POA: Diagnosis not present

## 2015-04-26 DIAGNOSIS — G4710 Hypersomnia, unspecified: Secondary | ICD-10-CM

## 2015-04-26 DIAGNOSIS — G4730 Sleep apnea, unspecified: Secondary | ICD-10-CM

## 2015-04-27 NOTE — Sleep Study (Signed)
Please see the scanned sleep study interpretation located in the Procedure tab within the Chart Review section. 

## 2015-05-04 ENCOUNTER — Telehealth: Payer: Self-pay | Admitting: Internal Medicine

## 2015-05-04 ENCOUNTER — Telehealth: Payer: Self-pay | Admitting: Neurology

## 2015-05-04 DIAGNOSIS — I639 Cerebral infarction, unspecified: Secondary | ICD-10-CM

## 2015-05-04 DIAGNOSIS — H4011X2 Primary open-angle glaucoma, moderate stage: Secondary | ICD-10-CM | POA: Diagnosis not present

## 2015-05-04 DIAGNOSIS — G4733 Obstructive sleep apnea (adult) (pediatric): Secondary | ICD-10-CM

## 2015-05-04 NOTE — Telephone Encounter (Signed)
I spoke to Juan Johnson. He is aware of results and would like to proceed with second study. He is aware Dawn will call to schedule. He states that his cell phone number is the best to reach him and if he doesn't answer, leave a message.

## 2015-05-04 NOTE — Telephone Encounter (Signed)
Pt called to ask if he needed to continue the [facility] since he was feeling better. Informed pt to continue [facility] as prescribed. Offered refill. Refill not needed at this time. Pt will have pharmacy to send request when needed. Nothing further needed at this time.

## 2015-05-04 NOTE — Telephone Encounter (Signed)
Dr. Cathren Laine patient, seen by me on 04/13/15:   Please call and notify the patient that the recent sleep study did confirm the diagnosis of obstructive sleep apnea and that I recommend treatment for this in the form of CPAP. This will require a repeat sleep study for proper titration and mask fitting. Please explain to patient and arrange for a CPAP titration study. I have placed an order in the chart. Please route to Dawn, Thanks, Star Age, MD, PhD [name])

## 2015-05-25 ENCOUNTER — Ambulatory Visit (INDEPENDENT_AMBULATORY_CARE_PROVIDER_SITE_OTHER)
Admission: RE | Admit: 2015-05-25 | Discharge: 2015-05-25 | Disposition: A | Discharge status: Home / Self Care | Payer: Medicare Other | Source: Ambulatory Visit | Attending: Internal Medicine | Admitting: Internal Medicine

## 2015-05-25 ENCOUNTER — Ambulatory Visit (INDEPENDENT_AMBULATORY_CARE_PROVIDER_SITE_OTHER): Payer: Medicare Other | Admitting: Internal Medicine

## 2015-05-25 ENCOUNTER — Encounter: Payer: Self-pay | Admitting: Internal Medicine

## 2015-05-25 VITALS — BP 160/70 | HR 104 | Temp 100.3°F | Ht 69.0 in | Wt 216.0 lb

## 2015-05-25 DIAGNOSIS — J449 Chronic obstructive pulmonary disease, unspecified: Secondary | ICD-10-CM

## 2015-05-25 DIAGNOSIS — J189 Pneumonia, unspecified organism: Secondary | ICD-10-CM | POA: Diagnosis not present

## 2015-05-25 DIAGNOSIS — R0602 Shortness of breath: Secondary | ICD-10-CM | POA: Diagnosis not present

## 2015-05-25 DIAGNOSIS — I10 Essential (primary) hypertension: Secondary | ICD-10-CM | POA: Diagnosis not present

## 2015-05-25 DIAGNOSIS — I639 Cerebral infarction, unspecified: Secondary | ICD-10-CM | POA: Diagnosis not present

## 2015-05-25 MED ORDER — TRAMADOL HCL 50 MG PO TABS: ORAL_TABLET | ORAL | Status: DC

## 2015-05-25 MED ORDER — LEVOFLOXACIN 750 MG PO TABS: 750.0000 mg | ORAL_TABLET | Freq: Every day | ORAL | Status: DC

## 2015-05-25 MED ORDER — PREDNISONE 10 MG PO TABS: ORAL_TABLET | ORAL | Status: DC

## 2015-05-25 NOTE — Assessment & Plan Note (Signed)
PFts 09/22/14 FEV1  2.64 (80%) ratio 67 and no change p B2 with dlco 82%  - 04/11/2015   rec dulera 100 2bid  Really more of an asthmatic bronchitic phenotype actaully very well controlled until apparent acute bronchopna so no need to change maint rx  The proper method of use, as well as anticipated side effects, of a metered-dose inhaler are discussed and demonstrated to the patient. Improved effectiveness after extensive coaching during this visit to a level of approximately  90%  I had an extended discussion with the patient reviewing all relevant studies completed to date and  lasting 15 to 20 minutes of a 25 minute visit    Each maintenance medication was reviewed in detail including most importantly the difference between maintenance and prns and under what circumstances the prns are to be triggered using an action plan format that is not reflected in the computer generated alphabetically organized AVS.    Please see instructions for details which were reviewed in writing and the patient given a copy highlighting the part that I personally wrote and discussed at today's ov.

## 2015-05-25 NOTE — Assessment & Plan Note (Signed)
CXR and hx c/w acute bronchopna RLL > rx x 5 days levaquin should be adequate but if not resolved clinically will give another 5 days of the lower dose

## 2015-05-25 NOTE — Assessment & Plan Note (Signed)
Not Adequate control on present rx, reviewed > no change in rx needed  For now/ f/u next ov

## 2015-05-25 NOTE — Patient Instructions (Addendum)
Continue dulera 100 Take 2 puffs first thing in am and then another 2 puffs about 12 hours later.   Only use your albuterol as a rescue medication to be used if you can't catch your breath by resting or doing a relaxed purse lip breathing pattern.  - The less you use it, the better it will work when you need it. - Ok to use up to 2 puffs  every 4 hours if you must but call for immediate appointment if use goes up over your usual need - Don't leave home without it !!  (think of it like the spare tire for your car)   Take mucinex dm  1200  every 12 hours and supplement if needed with  tramadol 50 mg up to 1- 2 every 4 hours to suppress the urge to cough. Swallowing water or using ice chips/non mint and menthol containing candies (such as lifesavers or sugarless jolly ranchers) are also effective.  You should rest your voice and avoid activities that you know make you cough.  Once you have eliminated the cough for 3 straight days try reducing the tramadol first,  then the mucinex dm as tolerated.    levaquin 750 mg one daily x 5 days  Prednisone 10 mg take  4 each am x 2 days,   2 each am x 2 days,  1 each am x 2 days and stop   See Tammy NP w/in 4 weeks with all your medications, even over the counter meds, separated in two separate bags, the ones you take no matter what vs the ones you stop once you feel better and take only as needed when you feel you need them.   Tammy  will generate for you a new user friendly medication calendar that will put [place] all on the same page re: your medication use.     Without this process, it simply isn't possible to assure that we are providing  your outpatient care  with  the attention to detail we feel you deserve.   If we cannot assure that you're getting that kind of care,  then we cannot manage your problem effectively from this clinic.  Once you have seen Tammy and we are sure that we're all on the same page with your medication use she will arrange follow up  with me.  Add needs f/u cxr next ov

## 2015-05-25 NOTE — Progress Notes (Signed)
Subjective:   Patient ID: Juan Johnson, male    DOB: 1951/06/16   MRN: [id number]    Brief patient profile:  1 yowm quit smoking around 1995 with h/o "bronchial troubles" all the way back to childhood with bad short flares of cough with no meds in between with no need for any daily therapy but starting summer 2015 bad cough fits seem much  better with depomedrol  But then relapse within a few days back coughing again so referred by Dr Greta Doom 08/22/2014 having failed to improve on flovent max strength with mild airflow obst by pfts 09/23/14 c/w GOLD I copd    History of Present Illness  08/22/2014 1st [facility] office visit/ Wert   Chief Complaint  Patient presents with  . Pulmonary Consult    Referred per Dr. Modena Morrow.  Pt states dxed with COPD approx 2-3 yrs ago. Pt c/o SOB and cough on and off for the past 6 months.  He states does okay walking from his house to his barn and wlaking any distance at an incline. Cough is prod with minimal yellow to white sputum.  He also c/o CP that comes and goes when he coughs.   usually able to walk from barn to house ok but not since onset of this new pattern of day time cough in summer of 2015 No noct or early am cough or sign excess mucus production  rec Depodrol  120 mg today Stop lisinopril and flovent  Change prilosec to 20 mg Take 30- 60 min before your first and last meals of the day  GERD Only use your albuterol as a rescue medication prn   09/22/2014 f/u ov/Wert re: copd GOLD I/ symptoms better off acei  Chief Complaint  Patient presents with  . Follow-up    Pt states that his cough and SOB are much better. No new co's today.    Not limited by breathing from desired activities  / not needing any saba at all rec F/u prn    04/11/2015  Acute  ov/Wert re: recurrent cough / GOLD I copd  Chief Complaint  Patient presents with  . Follow-up    Pt states he got a cold in May and his cough has been bothering him since then. Cough is  occ prod with clear sputum.   Fine until May after started working around hay and since then  Cough back again  day > noct rx zyrtec and singular and symbicort 160 (though poor technique)  and shot of cortisone transiently improved  rec Stop symbiocort Try dulera 100 Take 2 puffs first thing in am and then another 2 puffs about 12 hours later - if better fill prescription-  if not see me back with all medications Only use your albuterol as a rescue medication   Prednisone 10 mg take  4 each am x 2 days,   2 each am x 2 days,  1 each am x 2 days and stop  Prilosec 20 mg x 2  X 30 min Take 30- 60 min before your first and last meals of the day  GERD  Diet  Ask your eye doctor for to replace the timoptic on a trial basis > done    05/25/2015  Acute ov/Wert re: GOLD I/ acute exac chronic cough / new fever and purulent sputum Chief Complaint  Patient presents with  . Follow-up    Pt c/o increased cough for the past wk- prod with minimal brown sputum.  He is also having more SOB than usual.  He has been using rescue inhaler more often.     Was doing better maint dulera 100 2 every 12 hours and no need for alb then acutely worse x one week runny nose/ bilateral lower chest discomfort, mild sore throat  No obvious day to day or daytime variabilty or assoc chest tightness, subjective wheeze overt sinus or hb symptoms. No unusual exp hx or h/o childhood pna/ asthma or knowledge of premature birth.  Sleeping ok without nocturnal  or early am exacerbation  of respiratory  c/o's or need for noct saba. Also denies any obvious fluctuation of symptoms with weather or environmental changes or other aggravating or alleviating factors except as outlined above   Current Medications, Allergies, Complete Past Medical History, Past Surgical History, Family History, and Social History were reviewed in [employer] record.  ROS  The following are not active complaints unless bolded sore  throat, dysphagia, dental problems, itching, sneezing,  nasal congestion or excess/ purulent secretions, ear ache,   fever, chills, sweats, unintended wt loss, pleuritic or exertional cp, hemoptysis,  orthopnea pnd or leg swelling, presyncope, palpitations, heartburn, abdominal pain, anorexia, nausea, vomiting, diarrhea  or change in bowel or urinary habits, change in stools or urine, dysuria,hematuria,  rash, arthralgias, visual complaints, headache, numbness weakness or ataxia or problems with walking or coordination,  change in mood/affect or memory.             Objective:   Physical Exam   Pleasant hoarse amb wm nad   Hacking cough  But not toxic appearing at all   09/22/2014     224 >  04/11/2015  217 >  05/25/2015 216  Wt Readings from Last 3 Encounters:  08/22/14 226 lb (102.513 kg)  06/24/14 225 lb 12.8 oz (102.422 kg)  10/12/12 215 lb (97.523 kg)    Vital signs reviewed    HEENT: nl dentition, turbinates, and orophanx. Nl external ear canals without cough reflex   NECK :  without JVD/Nodes/TM/ nl carotid upstrokes bilaterally   LUNGS: no acc muscle use,  insp and exp rhonchi R>L   CV:  RRR  no s3 or murmur or increase in P2, no edema   ABD:  soft and nontender with nl excursion in the supine position. No bruits or organomegaly, bowel sounds nl  MS:  warm without deformities, calf tenderness, cyanosis or clubbing  SKIN: warm and dry without lesions    NEURO:  alert, approp, no deficits   CXR PA and Lateral:   05/25/2015 :     I personally reviewed images and agree with radiology impression as follows:    Right lower lobe infiltrate consistent with pneumonia.     Assessment & Plan:

## 2015-05-29 ENCOUNTER — Telehealth: Payer: Self-pay | Admitting: Internal Medicine

## 2015-05-29 MED ORDER — LEVOFLOXACIN 500 MG PO TABS: 500.0000 mg | ORAL_TABLET | Freq: Every day | ORAL | Status: DC

## 2015-05-29 NOTE — Telephone Encounter (Signed)
lmomtcb x1 for pt 

## 2015-05-29 NOTE — Telephone Encounter (Signed)
Called spoke with pt. Aware of recs. RX sent in. He wanted to hold off on OV for now.

## 2015-05-29 NOTE — Telephone Encounter (Signed)
Patient returned call, can be reached at [phone].

## 2015-05-29 NOTE — Telephone Encounter (Signed)
Called spoke with pt. He has prod cough (clear phlem), chest tx. No f/c/s/n/v. He will finished levaquin and prednisone tomorrow. Pt wants more ABX called in. Please advise MW thanks

## 2015-05-29 NOTE — Telephone Encounter (Signed)
levaquin 500 mg daily x 7 days then ov with tammy f/u cxr

## 2015-05-30 DIAGNOSIS — H4011X2 Primary open-angle glaucoma, moderate stage: Secondary | ICD-10-CM | POA: Diagnosis not present

## 2015-06-09 ENCOUNTER — Telehealth: Payer: Self-pay | Admitting: Internal Medicine

## 2015-06-09 MED ORDER — LEVOFLOXACIN 500 MG PO TABS: 500.0000 mg | ORAL_TABLET | Freq: Every day | ORAL | Status: DC

## 2015-06-09 NOTE — Telephone Encounter (Signed)
Called and spoke to pt. Pt c/o chest tightness when coughing and increase in cough with intermittent mucus production - white in color x 1 day. Pt denies increase in SOB, f/c/s, swelling, hemoptysis, and pain. Pt is taking mucinex as MW recommended and tylenol prn. Pt states after completing the abx and pred MW prescribed he felt great, pt's s/s started today and he does not want s/s to worsen.   Dr. Melvyn Novas please advise. Thanks.

## 2015-06-09 NOTE — Telephone Encounter (Signed)
levaquin 500mg  daily x 5 days then ov with cxr next step

## 2015-06-09 NOTE — Telephone Encounter (Signed)
Called and spoke to pt. Informed pt of the recs per MW. Appt made with MW on 06/15/2015 with CXR and abx sent to preferred pharmacy. Pt verbalized understanding and denied any further questions or concerns at this time.

## 2015-06-15 ENCOUNTER — Ambulatory Visit (INDEPENDENT_AMBULATORY_CARE_PROVIDER_SITE_OTHER)
Admission: RE | Admit: 2015-06-15 | Discharge: 2015-06-15 | Disposition: A | Discharge status: Home / Self Care | Payer: Medicare Other | Source: Ambulatory Visit | Attending: Internal Medicine | Admitting: Internal Medicine

## 2015-06-15 ENCOUNTER — Ambulatory Visit (INDEPENDENT_AMBULATORY_CARE_PROVIDER_SITE_OTHER): Payer: Medicare Other | Admitting: Internal Medicine

## 2015-06-15 ENCOUNTER — Encounter: Payer: Self-pay | Admitting: Internal Medicine

## 2015-06-15 VITALS — BP 122/60 | HR 65 | Ht 68.0 in | Wt 219.0 lb

## 2015-06-15 DIAGNOSIS — E669 Obesity, unspecified: Secondary | ICD-10-CM

## 2015-06-15 DIAGNOSIS — H35372 Puckering of macula, left eye: Secondary | ICD-10-CM | POA: Diagnosis not present

## 2015-06-15 DIAGNOSIS — I10 Essential (primary) hypertension: Secondary | ICD-10-CM

## 2015-06-15 DIAGNOSIS — J449 Chronic obstructive pulmonary disease, unspecified: Secondary | ICD-10-CM

## 2015-06-15 DIAGNOSIS — I69398 Other sequelae of cerebral infarction: Secondary | ICD-10-CM | POA: Diagnosis not present

## 2015-06-15 DIAGNOSIS — H53461 Homonymous bilateral field defects, right side: Secondary | ICD-10-CM | POA: Diagnosis not present

## 2015-06-15 DIAGNOSIS — I639 Cerebral infarction, unspecified: Secondary | ICD-10-CM

## 2015-06-15 DIAGNOSIS — J189 Pneumonia, unspecified organism: Secondary | ICD-10-CM

## 2015-06-15 DIAGNOSIS — J181 Lobar pneumonia, unspecified organism: Secondary | ICD-10-CM

## 2015-06-15 MED ORDER — MOMETASONE FURO-FORMOTEROL FUM 100-5 MCG/ACT IN AERO: INHALATION_SPRAY | RESPIRATORY_TRACT | Status: DC

## 2015-06-15 NOTE — Patient Instructions (Addendum)
I recommend you receive Prevnar 13 this year.  If cough bothers you mucinex dm then consider adding pepcid ac 20 mg after bfast and supper   If you are satisfied with your treatment plan,  let your doctor know and he/she can either refill your medications or you can return here when your prescription runs out.     If in any way you are not 100% satisfied,  please tell us.  If 100% better, tell your friends!  Pulmonary follow up is as needed

## 2015-06-15 NOTE — Progress Notes (Signed)
Subjective:   Patient ID: Juan Johnson, male    DOB: [birth year]   MRN: [id number]    Brief patient profile:  10 yowm quit smoking around 1995 with h/o "bronchial troubles" all the way back to childhood with bad short flares of cough with no meds in between with no need for any daily therapy but starting summer 2015 bad cough fits seem much  better with depomedrol  But then relapse within a few days back coughing again so referred by Dr Greta Doom 08/22/2014 having failed to improve on flovent max strength with mild airflow obst by pfts 09/23/14 c/w GOLD I copd      History of Present Illness  08/22/2014 1st [facility] office visit/ Shauntavia Brackin   Chief Complaint  Patient presents with  . Pulmonary Consult    Referred per Dr. Modena Morrow.  Pt states dxed with COPD approx 2-3 yrs ago. Pt c/o SOB and cough on and off for the past 6 months.  He states does okay walking from his house to his barn and wlaking any distance at an incline. Cough is prod with minimal yellow to white sputum.  He also c/o CP that comes and goes when he coughs.   usually able to walk from barn to house ok but not since onset of this new pattern of day time cough in summer of 2015 No noct or early am cough or sign excess mucus production  rec Depodrol  120 mg today Stop lisinopril and flovent  Change prilosec to 20 mg Take 30- 60 min before your first and last meals of the day  GERD Only use your albuterol as a rescue medication prn   04/11/2015  Acute  ov/Dany Harten re: recurrent cough / GOLD I copd  Chief Complaint  Patient presents with  . Follow-up    Pt states he got a cold in May and his cough has been bothering him since then. Cough is occ prod with clear sputum.   Was completely Fine until May 2016 after started working around hay and since then  Cough back again  day > noct rx zyrtec and singular and symbicort 160 (though poor technique)  and shot of cortisone transiently improved  rec Stop symbiocort Try dulera 100  Take 2 puffs first thing in am and then another 2 puffs about 12 hours later - if better fill prescription-  if not see me back with all medications Only use your albuterol as a rescue medication   Prednisone 10 mg take  4 each am x 2 days,   2 each am x 2 days,  1 each am x 2 days and stop  Prilosec 20 mg x 2  X 30 min Take 30- 60 min before your first and last meals of the day  GERD  Diet  Ask your eye doctor for to replace the timoptic on a trial basis > done    05/25/2015  Acute ov/Jemila Camille re: GOLD I/ acute exac chronic cough / new fever and purulent sputum Chief Complaint  Patient presents with  . Follow-up    Pt c/o increased cough for the past wk- prod with minimal brown sputum.  He is also having more SOB than usual.  He has been using rescue inhaler more often.    Was doing better maint dulera 100 2 every 12 hours and no need for alb then acutely worse x one week runny nose/ bilateral lower chest discomfort, mild sore throat rec Continue dulera 100 Take 2  puffs first thing in am and then another 2 puffs about 12 hours later.  Only use your albuterol as a rescue medication  Take mucinex dm  1200  every 12 hours and supplement if needed with  tramadol 50 mg up to 1- 2 every 4 hours Once you have eliminated the cough for 3 straight days try reducing the tramadol first,  then the mucinex dm as tolerated.   levaquin 750 mg one daily x 5 days then 500 mg per day x 5 more     06/15/2015 f/u ov/Tanasia Budzinski re: cap / GOLD I copd s/p 10 d levaquin Chief Complaint  Patient presents with  . Follow-up    Pt states his breathing has improved and not coughing as much.     No purulent sputum and sleeping well - no longer requiring any Mucinex or any rescue inhalers.  No obvious day to day or daytime variabilty or assoc chest tightness, subjective wheeze overt sinus or hb symptoms. No unusual exp hx or h/o childhood pna/ asthma or knowledge of premature birth.  Sleeping ok without nocturnal  or early am  exacerbation  of respiratory  c/o's or need for noct saba. Also denies any obvious fluctuation of symptoms with weather or environmental changes or other aggravating or alleviating factors except as outlined above   Current Medications, Allergies, Complete Past Medical History, Past Surgical History, Family History, and Social History were reviewed in [employer] record.  ROS  The following are not active complaints unless bolded sore throat, dysphagia, dental problems, itching, sneezing,  nasal congestion or excess/ purulent secretions, ear ache,   fever, chills, sweats, unintended wt loss, pleuritic or exertional cp, hemoptysis,  orthopnea pnd or leg swelling, presyncope, palpitations, heartburn, abdominal pain, anorexia, nausea, vomiting, diarrhea  or change in bowel or urinary habits, change in stools or urine, dysuria,hematuria,  rash, arthralgias, visual complaints, headache, numbness weakness or ataxia or problems with walking or coordination,  change in mood/affect or memory.             Objective:   Physical Exam   Pleasant hoarse amb wm nad   Min cough  09/22/2014     224 >  04/11/2015  217 >  05/25/2015 216 > 06/15/2015  219  Wt Readings from Last 3 Encounters:  08/22/14 226 lb (102.513 kg)  06/24/14 225 lb 12.8 oz (102.422 kg)  10/12/12 215 lb (97.523 kg)    Vital signs reviewed    HEENT: nl dentition, turbinates, and orophanx. Nl external ear canals without cough reflex   NECK :  without JVD/Nodes/TM/ nl carotid upstrokes bilaterally   LUNGS: no acc muscle use,  Completely clear bilaterally    CV:  RRR  no s3 or murmur or increase in P2, no edema   ABD:  soft and nontender with nl excursion in the supine position. No bruits or organomegaly, bowel sounds nl  MS:  warm without deformities, calf tenderness, cyanosis or clubbing  SKIN: warm and dry without lesions    NEURO:  alert, approp, no deficits    I personally reviewed images and agree  with radiology impression as follows:  CXR:  06/15/2015  Improved but not completely cleared bilateral infiltrates    Assessment & Plan:   Outpatient Encounter Prescriptions as of 06/15/2015  Medication Sig  . albuterol (PROAIR HFA) 108 (90 BASE) MCG/ACT inhaler Inhale 2 puffs into the lungs every 6 (six) hours as needed for wheezing or shortness of breath.  Marland Kitchen  Cetirizine HCl (ZYRTEC PO) Take 1 tablet by mouth daily.  Marland Kitchen dipyridamole-aspirin (AGGRENOX) 200-25 MG per 12 hr capsule Take 1 capsule by mouth 2 (two) times daily.  Marland Kitchen ezetimibe (ZETIA) 10 MG tablet Take 10 mg by mouth daily.  Marland Kitchen ketorolac (ACULAR) 0.5 % ophthalmic solution   . latanoprost (XALATAN) 0.005 % ophthalmic solution Place 1 drop into both eyes at bedtime.   . mometasone-formoterol (DULERA) 100-5 MCG/ACT AERO Take 2 puffs first thing in am and then another 2 puffs about 12 hours later.  . montelukast (SINGULAIR) 10 MG tablet Take 10 mg by mouth daily.  . Multiple Vitamins-Minerals (MULTIVITAMIN WITH MINERALS) tablet Take 1 tablet by mouth daily.  . niacin (NIASPAN) 1000 MG CR tablet Take 1,000 mg by mouth daily.  Marland Kitchen omeprazole (PRILOSEC) 20 MG capsule   . valsartan-hydrochlorothiazide (DIOVAN HCT) 160-12.5 MG per tablet Take 1 tablet by mouth daily.  . [DISCONTINUED] mometasone-formoterol (DULERA) 100-5 MCG/ACT AERO Take 2 puffs first thing in am and then another 2 puffs about 12 hours later.  . [DISCONTINUED] levofloxacin (LEVAQUIN) 500 MG tablet Take 1 tablet (500 mg total) by mouth daily.  . [DISCONTINUED] levofloxacin (LEVAQUIN) 500 MG tablet Take 1 tablet (500 mg total) by mouth daily.  . [DISCONTINUED] levofloxacin (LEVAQUIN) 750 MG tablet Take 1 tablet (750 mg total) by mouth daily. One daily stop if develop aching in joints/ muscles  . [DISCONTINUED] predniSONE (DELTASONE) 10 MG tablet Take  4 each am x 2 days,   2 each am x 2 days,  1 each am x 2 days and stop  . [DISCONTINUED] traMADol (ULTRAM) 50 MG tablet 1-2 every 4  hours as needed for cough or pain (Patient not taking: Reported on 06/15/2015)   No facility-administered encounter medications on file as of 06/15/2015.

## 2015-06-16 ENCOUNTER — Encounter: Payer: Self-pay | Admitting: Internal Medicine

## 2015-06-16 NOTE — Assessment & Plan Note (Signed)
Body mass index is 33.31    Lab Results  Component Value Date   TSH 1.370 06/24/2014     Contributing to gerd tendency/ doe/reviewed need  achieve and maintain neg calorie balance > defer f/u primary care including intermittently monitoring thyroid status

## 2015-06-16 NOTE — Assessment & Plan Note (Signed)
See cxr 05/25/2015  RLL post segment > rx levaquin 750 x 5 days then 500 mg x 5 more  Very little evidence clinically or radiographically of any residual effects so f/u chest x-ray is optional at this point.

## 2015-06-16 NOTE — Assessment & Plan Note (Addendum)
PFts 09/22/14 FEV1  2.64 (80%) ratio 67 and no change p B2 with dlco 82%  - 04/11/2015   rec dulera 100 2bid - 05/25/2015 p extensive coaching HFA effectiveness =    90%   He has very mild disease and should be well served by using low-dose Dulera and continuing on Singulair for any asthmatic or allergic component to his airways symptoms  I had an extended final summary discussion with the patient reviewing all relevant studies completed to date and  lasting 15 to 20 minutes of a 25 minute visit on the following issues:   1) okay to add back Timoptic eyedrops as long as he has no active or unstable respiratory symptoms. Low threshold to go back to more specific beta blockers if his airways disease flares.  2) Each maintenance medication was reviewed in detail including most importantly the difference between maintenance and as needed and under what circumstances the prns are to be used.  Please see instructions for details which were reviewed in writing and the patient given a copy.

## 2015-06-16 NOTE — Assessment & Plan Note (Signed)
Trial off acei 08/22/2014 due to cough > resolved  Adequate control on present rx, reviewed > no change in rx needed  > I would avoid rechallenging  him him with Ace inhibitors in the setting

## 2015-06-21 DIAGNOSIS — M25512 Pain in left shoulder: Secondary | ICD-10-CM | POA: Diagnosis not present

## 2015-06-22 ENCOUNTER — Other Ambulatory Visit: Payer: Self-pay | Admitting: Orthopedic Surgery

## 2015-06-22 ENCOUNTER — Ambulatory Visit (INDEPENDENT_AMBULATORY_CARE_PROVIDER_SITE_OTHER): Payer: Medicare Other | Admitting: Neurology

## 2015-06-22 DIAGNOSIS — G479 Sleep disorder, unspecified: Secondary | ICD-10-CM

## 2015-06-22 DIAGNOSIS — G4733 Obstructive sleep apnea (adult) (pediatric): Secondary | ICD-10-CM | POA: Diagnosis not present

## 2015-06-23 ENCOUNTER — Encounter: Payer: Medicare Other | Admitting: Adult Health

## 2015-06-23 NOTE — Sleep Study (Signed)
Please see the scanned sleep study interpretation located in the Procedure tab within the Chart Review section. 

## 2015-06-26 ENCOUNTER — Telehealth: Payer: Self-pay | Admitting: Neurology

## 2015-06-26 DIAGNOSIS — G4733 Obstructive sleep apnea (adult) (pediatric): Secondary | ICD-10-CM

## 2015-06-26 NOTE — Telephone Encounter (Signed)
I spoke to patient, he is wiling to start treatment. I will fax referral to [place]. I will fax report to PCP. I will also send letter to patient to remind him to make f/u appt and explain the importance of compliance.

## 2015-06-26 NOTE — Telephone Encounter (Signed)
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 He did well during the latest sleep study with CPAP. We will, therefore, arrange for a machine for home use through a DME (durable medical equipment) company of His choice; and I will see the patient back in follow-up in about 8-10 weeks. Please also explain to the patient that I will be looking out for compliance data, which can be downloaded from the machine (stored on an SD card, that is inserted in the machine) or via remote access through a modem, that is built into the machine. At the time of the followup appointment we will discuss sleep study results and how it is going with PAP treatment at home. Please advise patient to bring His machine at the time of the first FU visit, even though this is cumbersome. Bringing the machine for every visit after that will likely not be needed, but often helps for the first visit to troubleshoot if needed. Please re-enforce the importance of compliance with treatment and the need for [place] to monitor compliance data - often an insurance requirement and actually good feedback for the patient as far as how they are doing.  Also remind patient, that any interim PAP machine or mask issues should be first addressed with the DME company, as they can often help better with technical and mask fit issues. Please ask if patient has a preference regarding DME company.  Please also make sure, the patient has a follow-up appointment with me in about 8-10 weeks from the setup date, thanks.  Once you have spoken to the patient - and faxed/routed report to PCP and referring MD (if other than PCP), you can close this encounter, thanks,   Star Age, MD, PhD [name] ([place])

## 2015-06-29 ENCOUNTER — Other Ambulatory Visit: Payer: Self-pay | Admitting: Orthopedic Surgery

## 2015-06-29 DIAGNOSIS — R29898 Other symptoms and signs involving the musculoskeletal system: Secondary | ICD-10-CM

## 2015-06-29 DIAGNOSIS — M25512 Pain in left shoulder: Secondary | ICD-10-CM

## 2015-07-03 DIAGNOSIS — S46112A Strain of muscle, fascia and tendon of long head of biceps, left arm, initial encounter: Secondary | ICD-10-CM | POA: Diagnosis not present

## 2015-07-03 DIAGNOSIS — S46012A Strain of muscle(s) and tendon(s) of the rotator cuff of left shoulder, initial encounter: Secondary | ICD-10-CM | POA: Diagnosis not present

## 2015-07-07 DIAGNOSIS — M75112 Incomplete rotator cuff tear or rupture of left shoulder, not specified as traumatic: Secondary | ICD-10-CM | POA: Diagnosis not present

## 2015-07-13 DIAGNOSIS — Z23 Encounter for immunization: Secondary | ICD-10-CM | POA: Diagnosis not present

## 2015-07-17 ENCOUNTER — Other Ambulatory Visit (HOSPITAL_COMMUNITY): Payer: Self-pay | Admitting: Orthopedic Surgery

## 2015-07-17 DIAGNOSIS — M5416 Radiculopathy, lumbar region: Secondary | ICD-10-CM | POA: Diagnosis not present

## 2015-07-17 DIAGNOSIS — M5136 Other intervertebral disc degeneration, lumbar region: Secondary | ICD-10-CM | POA: Diagnosis not present

## 2015-07-17 DIAGNOSIS — W19XXXA Unspecified fall, initial encounter: Secondary | ICD-10-CM

## 2015-07-17 DIAGNOSIS — M7592 Shoulder lesion, unspecified, left shoulder: Secondary | ICD-10-CM

## 2015-07-20 ENCOUNTER — Encounter (HOSPITAL_COMMUNITY)
Admission: RE | Admit: 2015-07-20 | Discharge: 2015-07-20 | Disposition: A | Discharge status: Home / Self Care | Payer: Medicare Other | Source: Ambulatory Visit | Attending: Orthopedic Surgery | Admitting: Orthopedic Surgery

## 2015-07-20 ENCOUNTER — Encounter (HOSPITAL_COMMUNITY): Payer: Self-pay

## 2015-07-20 DIAGNOSIS — M7592 Shoulder lesion, unspecified, left shoulder: Secondary | ICD-10-CM | POA: Diagnosis not present

## 2015-07-20 DIAGNOSIS — R948 Abnormal results of function studies of other organs and systems: Secondary | ICD-10-CM | POA: Diagnosis not present

## 2015-07-20 DIAGNOSIS — W19XXXA Unspecified fall, initial encounter: Secondary | ICD-10-CM | POA: Insufficient documentation

## 2015-07-20 MED ORDER — TECHNETIUM TC 99M MEDRONATE IV KIT
25.0000 | PACK | Freq: Once | INTRAVENOUS | Status: AC | PRN
  Administered 2015-07-20: 25 via INTRAVENOUS

## 2015-07-26 DIAGNOSIS — M25512 Pain in left shoulder: Secondary | ICD-10-CM | POA: Diagnosis not present

## 2015-07-27 ENCOUNTER — Telehealth: Payer: Self-pay | Admitting: Neurology

## 2015-07-27 ENCOUNTER — Other Ambulatory Visit: Payer: Self-pay | Admitting: Neurology

## 2015-07-27 DIAGNOSIS — R569 Unspecified convulsions: Secondary | ICD-10-CM

## 2015-07-27 DIAGNOSIS — R404 Transient alteration of awareness: Secondary | ICD-10-CM

## 2015-07-27 NOTE — Telephone Encounter (Signed)
Patient called to discuss medication he is taking dipyridamole-aspirin (AGGRENOX) 200-25 MG per 12 hr capsule. Dr. Tamera Punt is wanting to do shoulder surgery then back surgery. Patient needs to talk to Dr. Jaynee Eagles about whether or not he can take this medication and have that surgery. Also has questions about CPAP. Would like to speak with Dr. Jaynee Eagles.

## 2015-07-27 NOTE — Telephone Encounter (Signed)
Spoke with Larene Beach B at check out. She is going to  call pt to see if on 10/25 he can come for f/u with Dr. Jaynee Eagles at 900am and then have EEG done with lorraine at 1030pm.

## 2015-07-27 NOTE — Telephone Encounter (Signed)
Spoke w/ Weyman Rodney who is the billing office manager, and she said the pt cannot see two different providers in the same office on the same day due to insurance reasons.

## 2015-07-27 NOTE — Telephone Encounter (Signed)
He has had several episodes of altered awareness. He found himself on the wrong side of the road and couldn't remember how he got there. He had another episode in the car as well. Advised him not to drive.    He needs a follow up appointment with me for 45 minutes. On the same day can we get an EEG please too? If possible, he needs follow up with dr. Rexene Alberts for OSA.   Any way we could get all this done on the same day?

## 2015-07-27 NOTE — Telephone Encounter (Signed)
I spoke to patient and schedule him for CPAP f/u with Dr. Rexene Alberts on 08/17/15 at 9:30.

## 2015-07-27 NOTE — Telephone Encounter (Signed)
Thank yoU !!! 

## 2015-07-31 ENCOUNTER — Telehealth: Payer: Self-pay | Admitting: Neurology

## 2015-07-31 NOTE — Telephone Encounter (Signed)
I have scheduled his MRI @ [facility] for tomorrow 08/01/15 @ 3PM & arrival time of 2:30PM. Patient is aware of tomorrow's appointment.

## 2015-07-31 NOTE — Telephone Encounter (Signed)
Patient called to request that MRI be scheduled at [facility], the machine at the location on [address] is too small and patient is claustrophobic, needs open MRI. Please call patient [phone].

## 2015-08-01 DIAGNOSIS — R404 Transient alteration of awareness: Secondary | ICD-10-CM | POA: Diagnosis not present

## 2015-08-01 DIAGNOSIS — R569 Unspecified convulsions: Secondary | ICD-10-CM | POA: Diagnosis not present

## 2015-08-08 ENCOUNTER — Ambulatory Visit (INDEPENDENT_AMBULATORY_CARE_PROVIDER_SITE_OTHER): Payer: Medicare Other | Admitting: Neurology

## 2015-08-08 ENCOUNTER — Telehealth: Payer: Self-pay | Admitting: *Deleted

## 2015-08-08 ENCOUNTER — Encounter: Payer: Self-pay | Admitting: Neurology

## 2015-08-08 ENCOUNTER — Ambulatory Visit (INDEPENDENT_AMBULATORY_CARE_PROVIDER_SITE_OTHER): Payer: Self-pay

## 2015-08-08 VITALS — BP 114/65 | HR 58 | Ht 68.0 in | Wt 229.4 lb

## 2015-08-08 DIAGNOSIS — R404 Transient alteration of awareness: Secondary | ICD-10-CM

## 2015-08-08 DIAGNOSIS — E538 Deficiency of other specified B group vitamins: Secondary | ICD-10-CM | POA: Diagnosis not present

## 2015-08-08 DIAGNOSIS — R569 Unspecified convulsions: Secondary | ICD-10-CM | POA: Diagnosis not present

## 2015-08-08 DIAGNOSIS — Z0289 Encounter for other administrative examinations: Secondary | ICD-10-CM

## 2015-08-08 DIAGNOSIS — G4733 Obstructive sleep apnea (adult) (pediatric): Secondary | ICD-10-CM | POA: Diagnosis not present

## 2015-08-08 DIAGNOSIS — R4189 Other symptoms and signs involving cognitive functions and awareness: Secondary | ICD-10-CM | POA: Diagnosis not present

## 2015-08-08 DIAGNOSIS — M5440 Lumbago with sciatica, unspecified side: Secondary | ICD-10-CM

## 2015-08-08 DIAGNOSIS — I639 Cerebral infarction, unspecified: Secondary | ICD-10-CM

## 2015-08-08 DIAGNOSIS — C719 Malignant neoplasm of brain, unspecified: Secondary | ICD-10-CM
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
## 2015-08-08 NOTE — Patient Instructions (Signed)
Overall you are doing fairly well but I do want to suggest a few things today:   Remember to drink plenty of fluid, eat healthy meals and do not skip any meals. Try to eat protein with a every meal and eat a healthy snack such as fruit or nuts in between meals. Try to keep a regular sleep-wake schedule and try to exercise daily, particularly in the form of walking, 20-30 minutes a day, if you can.   As far as your medications are concerned, I would like to suggest: continue current medications  As far as diagnostic testing: EEG today  I would like to see you back in 6 months, sooner if we need to. Please call us with any interim questions, concerns, problems, updates or refill requests.   Our phone number is [phone]. We also have an after hours call service for urgent matters and there is a physician on-call for urgent questions. For any emergencies you know to call 911 or go to the nearest emergency room

## 2015-08-08 NOTE — Telephone Encounter (Signed)
Thanks

## 2015-08-08 NOTE — Telephone Encounter (Signed)
Called and spoke to [place] at [facility]. She stated that they do have imaging for MRI brain from 10/18. The report has not been read yet. I advised that our doctors here did not receive a report to read on this pt yet. She transferred me to MRI department to see if report can be sent again. Courtney picked up call. She will send over to [facility] so the doctor can read the MRI. Images may have not went through or CD got misplaced. She is sending information again. Was told to call back if doctor does not get images.

## 2015-08-09 ENCOUNTER — Telehealth: Payer: Self-pay | Admitting: *Deleted

## 2015-08-09 LAB — B12 AND FOLATE PANEL
Folate: >20 ng/mL (ref >3.0)
VITAMIN B 12: 592 pg/mL (ref 211–946)

## 2015-08-09 LAB — BASIC METABOLIC PANEL
BUN / CREAT RATIO: 15 (ref 10–22)
BUN: 12 mg/dL (ref 8–27)
CALCIUM: 9.6 mg/dL (ref 8.6–10.2)
CHLORIDE: 101 mmol/L (ref 97–106)
CO2: 26 mmol/L (ref 18–29)
Creatinine, Ser: 0.79 mg/dL (ref 0.76–1.27)
GFR calc non Af Amer: 94 mL/min/{1.73_m2} (ref >59)
GFR, EST AFRICAN AMERICAN: 108 mL/min/{1.73_m2} (ref >59)
Glucose: 92 mg/dL (ref 65–99)
POTASSIUM: 4.3 mmol/L (ref 3.5–5.2)
SODIUM: 142 mmol/L (ref 136–144)

## 2015-08-09 NOTE — Telephone Encounter (Signed)
Spoke w/ wife about resluts. Okay per DPR. Advised per Dr. Jaynee Eagles that labs and EEG were normal. MRI brain stable, no changes. No new strokes or anything new. She verbalized understanding. Husband was mowing the lawn. She will let him know. Told her to call back if they had any further questions.

## 2015-08-09 NOTE — Telephone Encounter (Signed)
-----   Message from Melvenia Beam, MD sent at 08/09/2015  1:31 PM EDT ----- Patient labs normal thanks!

## 2015-08-10 NOTE — Telephone Encounter (Signed)
error 

## 2015-08-17 ENCOUNTER — Encounter: Payer: Self-pay | Admitting: Neurology

## 2015-08-17 ENCOUNTER — Ambulatory Visit (INDEPENDENT_AMBULATORY_CARE_PROVIDER_SITE_OTHER): Payer: Medicare Other | Admitting: Neurology

## 2015-08-17 VITALS — BP 112/60 | HR 62 | Resp 18 | Ht 68.0 in | Wt 215.5 lb

## 2015-08-17 DIAGNOSIS — Z9989 Dependence on other enabling machines and devices: Principal | ICD-10-CM

## 2015-08-17 DIAGNOSIS — G4733 Obstructive sleep apnea (adult) (pediatric): Secondary | ICD-10-CM | POA: Diagnosis not present

## 2015-08-17 DIAGNOSIS — I639 Cerebral infarction, unspecified: Secondary | ICD-10-CM | POA: Diagnosis not present

## 2015-08-17 NOTE — Progress Notes (Signed)
Subjective:    Patient ID: Juan Johnson is a 64 y.o. male.  HPI     Interim history:   Juan Johnson is a 64 year old right-handed gentleman with a medical history of COPD, hypertension, glaucoma, stroke, reflux disease and obesity, who presents for follow-up consultation of his sleep apnea, now on treatment with CPAP therapy at home, after his recent sleep studies. The patient is accompanied by his wife today. I first met him on 04/13/2015 at the request of Dr. Jaynee Eagles, at which time the patient reported snoring, excessive daytime somnolence, morning headaches and witnessed breathing pauses while asleep. I invited him back for sleep study. He had a baseline sleep study on 04/26/2015, followed by a CPAP titration study on 06/22/2015. I went over his sleep test results in detail with him today. His baseline sleep study from 04/26/2015 showed a sleep efficiency of 96.7% with a latency to sleep of 9.5 minutes and wake after sleep onset of 2.5 minutes. He had an increased percentage of stage II sleep, slow-wave sleep at 6.2% and REM sleep at 10.8% with a REM latency of 68 minutes. He had no significant PLMS, EKG or EEG changes. He had mild intermittent snoring. Total AHI was 10.4 per hour, rising to 37.8 per hour in the supine position. Average oxygen saturation was 94%, nadir was 87%. Based on his test results, his medical history and his sleep related complaints, I invited him back for a full night CPAP titration. He had this on 06/22/2015. Sleep efficiency was 74.4%, sleep latency was 3.5 minutes and wake after sleep onset was 117 minutes with moderate sleep fragmentation noted. He had an increased percentage of stage I and stage II sleep, absence of slow-wave sleep and REM sleep at 10.6% with a prolonged REM latency. He had no significant PLMS, EKG or EEG changes. CPAP was titrated from a pressure of 5 cm to 11 cm. He used a fullface mask. AHI was 0 per hour on the final pressure. Based on his test results are  prescribed CPAP therapy for home use.  Today, 08/17/2015: I reviewed his CPAP compliance data from 07/17/2015 2 08/15/2015 which is a total of 30 days during which time he used his machine every night with percent used days greater than 4 hours at 100%, indicating superb compliance with an average usage of 8 hours and 17 minutes, residual AHI mildly elevated at 7.4 per hour, leak at times high with the 95th percentile at 25 L/m on a pressure of 11 cm.  Today, 08/17/2015: He reports full compliance with treatment. He felt improved in his sleep in the beginning but currently has restless sleep, middle of the night awakenings and is having issues with sleeping on his back. He may need lower back surgery under Dr. Joya Salm. He may need left rotator cuff surgery under Dr. Tamera Punt. He had lumbar spine injections under Dr. Maryjean Ka. He takes Tylenol for back pain. He is usually a side sleeper but since the CPAP he has been sleeping more on his back. He is not convinced that CPAP is helpful for his sleep. He has some residual snoring as well. His wife reports that he does not appear to have any significant improvement in his sleep since starting CPAP.  Previously:  04/13/2015: He reports snoring, excessive daytime somnolence, witnessed apneas and morning headaches. I reviewed your office note from 03/16/2015. For his lung disease he has seen Dr. Melvyn Novas recently, I reviewed his office note from 04/11/2015 as well. His Symbicort was stopped  and he was started on [facility]. His Prilosec was also increased. His Epworth sleepiness score is 24 out of 24 today. His fatigue score is 51 out of 63 today. He goes to bed between 10 and 11 typically. Falling asleep is not a problem. He does watch TV in bed and sometimes turns it off in the middle of the night when he wakes up. He has nocturia once per night on average. He wakes up with a headache 3-4 times per week on average. This is a dull typically all over headache. He drinks green  tea throughout the day. He has reduced his soda intake. He quit smoking 28 years ago. He drinks alcohol in the form of beer, wine or 2 beers, 3-4 times a week. He had a tonsillectomy at age 61. He has a family history of obstructive sleep apnea in his older brother who uses a CPAP machine. He denies restless leg symptoms but has occasional leg cramps at night. It depends on how active he has been during the day. He does not typically exercise but [place]. His wake time is usually variable, anywhere between 5 and 8 AM. His wife sleeps in a separate bedroom because he is a restless sleeper and snores heavily at times. He also makes gasping sounds and choking sounds in his sleep.   His Past Medical History Is Significant For: Past Medical History  Diagnosis Date  . Hypertension   . Arthritis     His Past Surgical History Is Significant For: Past Surgical History  Procedure Laterality Date  . Shoulder surgery  09/24/2011  . Eye surgery  10/2007  . Neck surgery  06/29/2010  . Tonsillectomy      AT AGE 59  . Leg surgery      AT AGE 31    His Family History Is Significant For: Family History  Problem Relation Age of Onset  . Diabetes    . Hypothyroidism    . Hypertension Mother   . Cancer    . Diabetes type II Brother   . Diabetes type II Brother   . Hyperthyroidism    . Cataracts    . Glaucoma    . Macular degeneration Mother   . Stroke Father   . Emphysema Father     smoked    His Social History Is Significant For: Social History   Social History  . Marital Status: Married    Spouse Name: N/A  . Number of Children: 2  . Years of Education: Bachelors   Occupational History  . Retired     Social History Main Topics  . Smoking status: Former Smoker -- 3.00 packs/day for 23 years    Types: Cigarettes    Quit date: 10/12/1997  . Smokeless tobacco: Never Used  . Alcohol Use: No     Comment: OCC  . Drug Use: No  . Sexual Activity: Not Asked   Other  Topics Concern  . None   Social History Narrative   Patient is married with 2 children.   Patient is right handed.   Patient has a Bachelor's degree.   Patient drinks 2-3 caffeine drinks daily.    His Allergies Are:  Allergies  Allergen Reactions  . Crestor [Rosuvastatin] Other (See Comments)    Cramps and muscle weakness  . Simvastatin Other (See Comments)    Cramps and muscle weakness  :   His Current Medications Are:  Outpatient Encounter Prescriptions as of 08/17/2015  Medication Sig  .  albuterol (PROAIR HFA) 108 (90 BASE) MCG/ACT inhaler Inhale 2 puffs into the lungs every 6 (six) hours as needed for wheezing or shortness of breath.  . Cetirizine HCl (ZYRTEC PO) Take 1 tablet by mouth daily.  Marland Kitchen dipyridamole-aspirin (AGGRENOX) 200-25 MG per 12 hr capsule Take 1 capsule by mouth 2 (two) times daily.  Marland Kitchen ezetimibe (ZETIA) 10 MG tablet Take 10 mg by mouth daily.  Marland Kitchen ketorolac (ACULAR) 0.5 % ophthalmic solution   . latanoprost (XALATAN) 0.005 % ophthalmic solution Place 1 drop into both eyes at bedtime.   . mometasone-formoterol (DULERA) 100-5 MCG/ACT AERO Take 2 puffs first thing in am and then another 2 puffs about 12 hours later.  . montelukast (SINGULAIR) 10 MG tablet Take 10 mg by mouth daily.  . Multiple Vitamins-Minerals (MULTIVITAMIN WITH MINERALS) tablet Take 1 tablet by mouth daily.  . niacin (NIASPAN) 1000 MG CR tablet Take 1,000 mg by mouth daily.  Marland Kitchen omeprazole (PRILOSEC) 20 MG capsule Take 20 mg by mouth 2 (two) times daily before a meal.   . valsartan-hydrochlorothiazide (DIOVAN HCT) 160-12.5 MG per tablet Take 1 tablet by mouth daily.   No facility-administered encounter medications on file as of 08/17/2015.  :  Review of Systems:  Out of a complete 14 point review of systems, all are reviewed and negative with the exception of these symptoms as listed below:   Review of Systems  Neurological:       Patient states that he still wakes up many times in the night.  Still has daytime tiredness.     Objective:  Neurologic Exam  Physical Exam Physical Examination:   Filed Vitals:   08/17/15 0921  BP: 112/60  Pulse: 62  Resp: 18    General Examination: The patient is a very pleasant 64 y.o. male in no acute distress. He appears well-developed and well-nourished and well groomed. He is obese.  HEENT: Normocephalic, atraumatic, pupils are unequal, left pupil is irregular. He is status post bilateral cataract repairs. He has visual impairment, right more than left. Extraocular tracking is good without limitation to gaze excursion or nystagmus noted. Normal smooth pursuit is noted. Hearing is grossly intact. Face is symmetric with normal facial animation and normal facial sensation. Speech is clear with no dysarthria noted. There is no hypophonia. There is no lip, neck/head, jaw or voice tremor. Neck is supple with full range of passive and active motion. There are no carotid bruits on auscultation. Oropharynx exam reveals: mild mouth dryness, adequate dental hygiene and moderate airway crowding, due to longer tongue, longer uvula, narrow airway entry.. Mallampati is class II. Tongue protrudes centrally and palate elevates symmetrically. Tonsils are absent.   Chest: Clear to auscultation without wheezing, rhonchi or crackles noted.  Heart: S1+S2+0, regular and normal without murmurs, rubs or gallops noted.   Abdomen: Soft, non-tender and non-distended with normal bowel sounds appreciated on auscultation.  Extremities: There is no pitting edema in the distal lower extremities bilaterally. Pedal pulses are intact.  Skin: Warm and dry without trophic changes noted. There are mild varicose veins.  Musculoskeletal: exam reveals no obvious joint deformities, tenderness or joint swelling or erythema.   Neurologically:  Mental status: The patient is awake, alert and oriented in all 4 spheres. His immediate and remote memory, attention, language skills and fund  of knowledge are appropriate. There is no evidence of aphasia, agnosia, apraxia or anomia. Speech is clear with normal prosody and enunciation. Thought process is linear. Mood is normal and affect is  normal.  Cranial nerves II - XII are as described above under HEENT exam. In addition: shoulder shrug is normal with unequal shoulder height noted, L higher. Motor exam: Normal bulk, strength and tone is noted. There is no drift, tremor or rebound. Romberg is negative. Reflexes are 1+ throughout. Fine motor skills and coordination: intact. There is no truncal or gait ataxia. Sensory exam: intact to light touch.  Gait, station and balance: He stands with difficulty and reports low back pain.   Assessment and Plan:   In summary, Juan Johnson is a very pleasant 64 year old male with a medical history of COPD, hypertension, glaucoma, stroke, reflux disease and obesity, who presents for follow-up consultation of his obstructive sleep apnea, now on CPAP therapy. He had a baseline sleep study in July 2016 and a CPAP titration study in September 2016. We talked about his test results in detail today. We also talked about his compliance data. He is compliant with treatment and is commended for this. AHI is suboptimal at 7.4 at this time. While he does report compliance, he has residual sleep related problems. This is confounded by the fact that he has left shoulder pain and low back pain and may require surgeries for these issues. He had an EEG on 08/08/2015 which was reported as normal. He reports that he is cleared for surgery. His physical exam is stable. He is commended on being faithful with sleep apnea treatment with CPAP. I asked him to continue for now and we will reevaluate things in about 3 months. I explained in particular the risks and ramifications of untreated moderate to severe OSA, especially with respect to developing cardiovascular disease down the Road, including congestive heart failure, difficult  to treat hypertension, cardiac arrhythmias, or stroke. Even type 2 diabetes has, in part, been linked to untreated OSA. Symptoms of untreated OSA include daytime sleepiness, memory problems, mood irritability and mood disorder such as depression and anxiety, lack of energy, as well as recurrent headaches, especially morning headaches. We talked about trying to maintain a healthy lifestyle in general, as well as the importance of weight control. I encouraged the patient to eat healthy, exercise daily and keep well hydrated, to keep a scheduled bedtime and wake time routine, to not skip any meals and eat healthy snacks in between meals. I explained the importance of being compliant with PAP treatment, not only for insurance purposes but primarily to improve His symptoms, and for the patient's long term health benefit, including to reduce His cardiovascular risks. I would like to see him back in 3 months, sooner if the need arises. He is encouraged to keep his follow-up appointment with Dr. Jaynee Eagles as well.  I answered all their questions today and the patient and his wife were in agreement. . I spent 25 minutes in total face-to-face time with the patient, more than 50% of which was spent in counseling and coordination of care, reviewing test results, reviewing medication and discussing or reviewing the diagnosis of OSA, its prognosis and treatment options.

## 2015-08-17 NOTE — Patient Instructions (Addendum)
Please continue using your CPAP regularly. While your insurance requires that you use CPAP at least 4 hours each night on 70% of the nights, I recommend, that you not skip any nights and use it throughout the night if you can. Getting used to CPAP and staying with the treatment long term does take time and patience and discipline. Untreated obstructive sleep apnea when it is moderate to severe can have an adverse impact on cardiovascular health and raise her risk for heart disease, arrhythmias, hypertension, congestive heart failure, stroke and diabetes. Untreated obstructive sleep apnea causes sleep disruption, nonrestorative sleep, and sleep deprivation. This can have an impact on your day to day functioning and cause daytime sleepiness and impairment of cognitive function, memory loss, mood disturbance, and problems focussing. Using CPAP regularly can improve these symptoms.  We will do a 3 month follow up and revisit the CPAP therapy.

## 2015-08-23 DIAGNOSIS — Z6834 Body mass index (BMI) 34.0-34.9, adult: Secondary | ICD-10-CM | POA: Diagnosis not present

## 2015-08-23 DIAGNOSIS — M5136 Other intervertebral disc degeneration, lumbar region: Secondary | ICD-10-CM | POA: Diagnosis not present

## 2015-08-23 DIAGNOSIS — I10 Essential (primary) hypertension: Secondary | ICD-10-CM | POA: Diagnosis not present

## 2015-08-25 ENCOUNTER — Other Ambulatory Visit: Payer: Self-pay | Admitting: Neurosurgery

## 2015-08-25 DIAGNOSIS — M5136 Other intervertebral disc degeneration, lumbar region: Secondary | ICD-10-CM

## 2015-08-30 DIAGNOSIS — K219 Gastro-esophageal reflux disease without esophagitis: Secondary | ICD-10-CM | POA: Diagnosis not present

## 2015-08-30 DIAGNOSIS — J439 Emphysema, unspecified: Secondary | ICD-10-CM | POA: Diagnosis not present

## 2015-08-30 DIAGNOSIS — J3089 Other allergic rhinitis: Secondary | ICD-10-CM | POA: Diagnosis not present

## 2015-08-30 DIAGNOSIS — E784 Other hyperlipidemia: Secondary | ICD-10-CM | POA: Diagnosis not present

## 2015-08-30 DIAGNOSIS — I10 Essential (primary) hypertension: Secondary | ICD-10-CM | POA: Diagnosis not present

## 2015-08-31 DIAGNOSIS — E784 Other hyperlipidemia: Secondary | ICD-10-CM | POA: Diagnosis not present

## 2015-08-31 DIAGNOSIS — I10 Essential (primary) hypertension: Secondary | ICD-10-CM | POA: Diagnosis not present

## 2015-09-06 ENCOUNTER — Ambulatory Visit
Admission: RE | Admit: 2015-09-06 | Discharge: 2015-09-06 | Disposition: A | Discharge status: Home / Self Care | Payer: Medicare Other | Source: Ambulatory Visit | Attending: Neurosurgery | Admitting: Neurosurgery

## 2015-09-06 DIAGNOSIS — M4806 Spinal stenosis, lumbar region: Secondary | ICD-10-CM | POA: Diagnosis not present

## 2015-09-06 DIAGNOSIS — M5136 Other intervertebral disc degeneration, lumbar region: Secondary | ICD-10-CM

## 2015-09-06 MED ORDER — IOHEXOL 180 MG/ML  SOLN
17.0000 mL | Freq: Once | INTRAMUSCULAR | Status: DC | PRN
  Administered 2015-09-06: 17 mL via INTRATHECAL

## 2015-09-06 MED ORDER — DIAZEPAM 5 MG PO TABS: 5.0000 mg | ORAL_TABLET | Freq: Once | ORAL | Status: DC

## 2015-09-06 NOTE — Discharge Instructions (Signed)

## 2015-09-18 ENCOUNTER — Telehealth: Payer: Self-pay | Admitting: Neurology

## 2015-09-18 ENCOUNTER — Ambulatory Visit: Payer: Medicare Other | Admitting: Neurology

## 2015-09-18 DIAGNOSIS — Z6835 Body mass index (BMI) 35.0-35.9, adult: Secondary | ICD-10-CM | POA: Diagnosis not present

## 2015-09-18 DIAGNOSIS — M5136 Other intervertebral disc degeneration, lumbar region: Secondary | ICD-10-CM | POA: Diagnosis not present

## 2015-09-18 NOTE — Telephone Encounter (Signed)
Called pt back. He is having surgery on his low back (L3-L4). Will be under anesthesia. Having surgery in 3-5. Dr Joya Salm asked for him to obtain clearance from Dr Jaynee Eagles. Fax number: [phone]

## 2015-09-18 NOTE — Telephone Encounter (Signed)
Patient called to request that Dr. Jaynee Eagles send something to Dr. Joya Salm stating it is okay for him to go into surgery. Dr. Joya Salm [address], [address] [place] Phone [phone].

## 2015-09-19 ENCOUNTER — Encounter: Payer: Self-pay | Admitting: *Deleted

## 2015-09-19 ENCOUNTER — Other Ambulatory Visit: Payer: Self-pay | Admitting: Neurosurgery

## 2015-09-19 NOTE — Telephone Encounter (Signed)
Sent letter as requested

## 2015-09-19 NOTE — Telephone Encounter (Signed)
Juan Johnson - can you write a letter saying that patient is stable from a neurology perspective to undergo surgery. We will give him clearance. Thanks.

## 2015-09-25 ENCOUNTER — Other Ambulatory Visit (HOSPITAL_COMMUNITY): Payer: Self-pay | Admitting: *Deleted

## 2015-09-25 ENCOUNTER — Encounter (HOSPITAL_COMMUNITY): Payer: Self-pay

## 2015-09-25 ENCOUNTER — Encounter (HOSPITAL_COMMUNITY)
Admission: RE | Admit: 2015-09-25 | Discharge: 2015-09-25 | Disposition: A | Discharge status: Home / Self Care | Payer: Medicare Other | Source: Ambulatory Visit | Attending: Neurosurgery | Admitting: Neurosurgery

## 2015-09-25 DIAGNOSIS — I69398 Other sequelae of cerebral infarction: Secondary | ICD-10-CM | POA: Diagnosis not present

## 2015-09-25 DIAGNOSIS — I10 Essential (primary) hypertension: Secondary | ICD-10-CM | POA: Diagnosis not present

## 2015-09-25 DIAGNOSIS — J449 Chronic obstructive pulmonary disease, unspecified: Secondary | ICD-10-CM | POA: Diagnosis not present

## 2015-09-25 DIAGNOSIS — M4316 Spondylolisthesis, lumbar region: Secondary | ICD-10-CM | POA: Diagnosis not present

## 2015-09-25 DIAGNOSIS — H401132 Primary open-angle glaucoma, bilateral, moderate stage: Secondary | ICD-10-CM | POA: Diagnosis not present

## 2015-09-25 HISTORY — DX: Depression, unspecified: F32.A

## 2015-09-25 HISTORY — DX: Horseshoe tear of retina without detachment, left eye: H33.312

## 2015-09-25 HISTORY — DX: Unspecified glaucoma: H40.9

## 2015-09-25 HISTORY — DX: Anxiety disorder, unspecified: F41.9

## 2015-09-25 HISTORY — DX: Sleep apnea, unspecified: G47.30

## 2015-09-25 HISTORY — DX: Hepatomegaly, not elsewhere classified: R16.0

## 2015-09-25 HISTORY — DX: Pneumonia, unspecified organism: J18.9

## 2015-09-25 HISTORY — DX: Chronic obstructive pulmonary disease, unspecified: J44.9

## 2015-09-25 HISTORY — DX: Major depressive disorder, single episode, unspecified: F32.9

## 2015-09-25 HISTORY — DX: Other complications of anesthesia, initial encounter: T88.59XA

## 2015-09-25 HISTORY — DX: Other localized visual field defect, unspecified eye: H53.459

## 2015-09-25 HISTORY — DX: Adverse effect of unspecified anesthetic, initial encounter: T41.45XA

## 2015-09-25 HISTORY — DX: Cerebral infarction, unspecified: I63.9

## 2015-09-25 HISTORY — DX: Gastro-esophageal reflux disease without esophagitis: K21.9

## 2015-09-25 LAB — CBC
HEMATOCRIT: 39.4 % (ref 39.0–52.0)
Hemoglobin: 13.1 g/dL (ref 13.0–17.0)
MCH: 30.8 pg (ref 26.0–34.0)
MCHC: 33.2 g/dL (ref 30.0–36.0)
MCV: 92.7 fL (ref 78.0–100.0)
PLATELETS: 221 10*3/uL (ref 150–400)
RBC: 4.25 MIL/uL (ref 4.22–5.81)
RDW: 12.5 % (ref 11.5–15.5)
WBC: 8.3 10*3/uL (ref 4.0–10.5)

## 2015-09-25 LAB — SURGICAL PCR SCREEN
MRSA, PCR: NEGATIVE
Staphylococcus aureus: NEGATIVE

## 2015-09-25 LAB — BASIC METABOLIC PANEL
Anion gap: 7 (ref 5–15)
BUN: 11 mg/dL (ref 6–20)
CHLORIDE: 102 mmol/L (ref 101–111)
CO2: 29 mmol/L (ref 22–32)
CREATININE: 0.99 mg/dL (ref 0.61–1.24)
Calcium: 9.5 mg/dL (ref 8.9–10.3)
GFR calc Af Amer: >60 mL/min (ref >60)
GFR calc non Af Amer: >60 mL/min (ref >60)
GLUCOSE: 100 mg/dL — AB (ref 65–99)
POTASSIUM: 4 mmol/L (ref 3.5–5.1)
Sodium: 138 mmol/L (ref 135–145)

## 2015-09-25 LAB — ABO/RH: ABO/RH(D): O POS

## 2015-09-25 NOTE — Pre-Procedure Instructions (Signed)
Juan Johnson  09/25/2015     Your procedure is scheduled on Wednesday, September 27, 2015 at 12:30 PM.   Report to [facility] Entrance "A" Admitting Office at 9:30 AM.   Call this number if you have problems the morning of surgery: [phone]   Any questions prior to day of surgery, please call [phone] between 8 & 4 PM.   Remember:  Do not eat food or drink liquids after midnight Tuesday, 09/26/15.  Take these medicines the morning of surgery with A SIP OF WATER: Cetirizine (Zyrtec), Singulair, Omeprazole (Prilosec), eye drops, Albuterol inhaler - if needed.  Stop Aggrenox and Multivitamins as of today if you have not already done so. Bring CPAP mask with you day of surgery   Do not wear jewelry.  Do not wear lotions, powders, or cologne.  You may wear deodorant.  Men may shave face and neck.  Do not bring valuables to the hospital.  [facility] is not responsible for any belongings or valuables.  Contacts, dentures or bridgework may not be worn into surgery.  Leave your suitcase in the car.  After surgery it may be brought to your room.  For patients admitted to the hospital, discharge time will be determined by your treatment team.  Special instructions:  [name] - Preparing for Surgery  Before surgery, you can play an important role.  Because skin is not sterile, your skin needs to be as free of germs as possible.  You can reduce the number of germs on you skin by washing with CHG (chlorahexidine gluconate) soap before surgery.  CHG is an antiseptic cleaner which kills germs and bonds with the skin to continue killing germs even after washing.  Please DO NOT use if you have an allergy to CHG or antibacterial soaps.  If your skin becomes reddened/irritated stop using the CHG and inform your nurse when you arrive at Short Stay.  Do not shave (including legs and underarms) for at least 48 hours prior to the first CHG shower.  You may shave your  face.  Please follow these instructions carefully:   1.  Shower with CHG Soap the night before surgery and the                                morning of Surgery.  2.  If you choose to wash your hair, wash your hair first as usual with your       normal shampoo.  3.  After you shampoo, rinse your hair and body thoroughly to remove the                      Shampoo.  4.  Use CHG as you would any other liquid soap.  You can apply chg directly       to the skin and wash gently with scrungie or a clean washcloth.  5.  Apply the CHG Soap to your body ONLY FROM THE NECK DOWN.        Do not use on open wounds or open sores.  Avoid contact with your eyes, ears, mouth and genitals (private parts).  Wash genitals (private parts) with your normal soap.  6.  Wash thoroughly, paying special attention to the area where your surgery        will be performed.  7.  Thoroughly rinse your body with warm water from the neck  down.  8.  DO NOT shower/wash with your normal soap after using and rinsing off       the CHG Soap.  9.  Pat yourself dry with a clean towel.            10.  Wear clean pajamas.            11.  Place clean sheets on your bed the night of your first shower and do not        sleep with pets.  Day of Surgery  Do not apply any lotions the morning of surgery.  Please wear clean clothes to the hospital.   Please read over the following fact sheets that you were given. Pain Booklet, Coughing and Deep Breathing, Blood Transfusion Information, MRSA Information and Surgical Site Infection Prevention
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
## 2015-09-26 NOTE — H&P (Signed)
Juan Johnson is an 64 y.o. male.   Chief Complaint:back pain                HPI: patient who has been complaining of lumbar pain with radiation to both legs up to the point that walking 40 feet he needs to stop because of worsening of the pain. Conservative treatment has failed  Past Medical History  Diagnosis Date  . Hypertension   . Arthritis   . COPD (chronic obstructive pulmonary disease) ([place])   . Pneumonia 04/2015    "bronchial" pneumonia   . Stroke [facility]) ? 05/2014    peripheral vision loss  . Sleep apnea     does use cpap  . Depression   . Anxiety     claustrophobia  . GERD (gastroesophageal reflux disease)   . Enlarged liver     20-25 years ago  . Retinal tear of left eye   . Glaucoma   . 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 Complication of anesthesia     difficult to wake up after neck surgery    Past Surgical History  Procedure Laterality Date  . Shoulder surgery  09/24/2011  . Eye surgery  10/2007  . Neck surgery  06/29/2010  . Tonsillectomy      AT AGE 37  . Leg surgery      AT AGE 1  . Vasectomy    . Colonoscopy    . Cardiac catheterization  2003    Family History  Problem Relation Age of Onset  . Diabetes    . Hypothyroidism    . Hypertension Mother   . Cancer    . Diabetes type II Brother   . Diabetes type II Brother   . Hyperthyroidism    . Cataracts    . Glaucoma    . Macular degeneration Mother   . Stroke Father   . Emphysema Father     smoked   Social History:  reports that he quit smoking about 17 years ago. His smoking use included Cigarettes. He has a 69 pack-year smoking history. He has never used smokeless tobacco. He reports that he does not drink alcohol or use illicit drugs.  Allergies:  Allergies  Allergen Reactions  . Crestor [Rosuvastatin] Other (See Comments)    Cramps and muscle weakness  . Simvastatin Other (See Comments)    Cramps and muscle weakness    No prescriptions prior to admission     Results for orders placed or performed during the hospital encounter of 09/25/15 (from the past 48 hour(s))  Surgical pcr screen     Status: None   Collection Time: 09/25/15 10:15 AM  Result Value Ref Range   MRSA, PCR NEGATIVE NEGATIVE   Staphylococcus aureus NEGATIVE NEGATIVE    Comment:        The Xpert SA Assay (FDA approved for NASAL specimens in patients over 24 years of age), is one component of a comprehensive surveillance program.  Test performance has been validated by [facility] for patients greater than or equal to 85 year old. It is not intended to diagnose infection nor to guide or monitor treatment.   Basic metabolic panel     Status: Abnormal   Collection Time: 09/25/15 10:16 AM  Result Value Ref Range   Sodium 138 135 - 145 mmol/L   Potassium 4.0 3.5 - 5.1 mmol/L   Chloride 102 101 - 111 mmol/L   CO2 29  22 - 32 mmol/L   Glucose, Bld 100 (H) 65 - 99 mg/dL   BUN 11 6 - 20 mg/dL   Creatinine, Ser 0.99 0.61 - 1.24 mg/dL   Calcium 9.5 8.9 - 10.3 mg/dL   GFR calc non Af Amer >60 >60 mL/min   GFR calc Af Amer >60 >60 mL/min    Comment: (NOTE) The eGFR has been calculated using the CKD EPI equation. This calculation has not been validated in all clinical situations. eGFR's persistently <60 mL/min signify possible Chronic Kidney Disease.    Anion gap 7 5 - 15  CBC     Status: None   Collection Time: 09/25/15 10:16 AM  Result Value Ref Range   WBC 8.3 4.0 - 10.5 K/uL   RBC 4.25 4.22 - 5.81 MIL/uL   Hemoglobin 13.1 13.0 - 17.0 g/dL   HCT 39.4 39.0 - 52.0 %   MCV 92.7 78.0 - 100.0 fL   MCH 30.8 26.0 - 34.0 pg   MCHC 33.2 30.0 - 36.0 g/dL   RDW 12.5 11.5 - 15.5 %   Platelets 221 150 - 400 K/uL  Type and screen All Cardiac and thoracic surgeries, spinal fusions, myomectomies, craniotomies, colon & liver resections, total joint revisions, same day c-section with placenta previa or accreta.     Status: None   Collection Time: 09/25/15 10:30 AM  Result  Value Ref Range   ABO/RH(D) O POS    Antibody Screen NEG    Sample Expiration 10/09/2015    Extend sample reason NO TRANSFUSIONS OR PREGNANCY IN THE PAST 3 MONTHS   ABO/Rh     Status: None   Collection Time: 09/25/15 10:30 AM  Result Value Ref Range   ABO/RH(D) O POS    No results found.  Review of Systems  Constitutional: Negative.   HENT: Negative.   Eyes: Negative.   Respiratory: Negative.   Cardiovascular: Negative.   Gastrointestinal: Negative.   Genitourinary: Negative.   Musculoskeletal: Positive for back pain.  Skin: Negative.   Neurological: Positive for sensory change and focal weakness.  Psychiatric/Behavioral: Negative.     There were no vitals taken for this visit. Physical Exam   Assessment/Plan hent, nl. Neck, anterior scar from previous cervical fusion. Cv.nl. Lungs, clear. Abdomen, nl. Exttremities, nl.  Neuro  slr positive at 60 degrees. Sensory normal but he feels a burning sensation in both legs. dtr nl. 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

## 2015-09-27 ENCOUNTER — Inpatient Hospital Stay (HOSPITAL_COMMUNITY): Payer: Medicare Other | Admitting: Certified Registered Nurse Anesthetist

## 2015-09-27 ENCOUNTER — Inpatient Hospital Stay (HOSPITAL_COMMUNITY): Payer: Medicare Other | Admitting: Vascular Surgery

## 2015-09-27 ENCOUNTER — Inpatient Hospital Stay (HOSPITAL_COMMUNITY): Payer: Medicare Other

## 2015-09-27 ENCOUNTER — Encounter (HOSPITAL_COMMUNITY): Payer: Self-pay | Admitting: Surgery

## 2015-09-27 ENCOUNTER — Encounter (HOSPITAL_COMMUNITY)
Admission: RE | Disposition: A | Discharge status: Home Health | Payer: Medicare Other | Source: Ambulatory Visit | Attending: Neurosurgery

## 2015-09-27 ENCOUNTER — Inpatient Hospital Stay (HOSPITAL_COMMUNITY)
Admission: RE | Admit: 2015-09-27 | Discharge: 2015-09-30 | DRG: 460 | Disposition: A | Discharge status: Home Health | Payer: Medicare Other | Source: Ambulatory Visit | Attending: Neurosurgery | Admitting: Neurosurgery

## 2015-09-27 DIAGNOSIS — M4726 Other spondylosis with radiculopathy, lumbar region: Secondary | ICD-10-CM | POA: Diagnosis not present

## 2015-09-27 DIAGNOSIS — Z87891 Personal history of nicotine dependence: Secondary | ICD-10-CM | POA: Diagnosis not present

## 2015-09-27 DIAGNOSIS — M4316 Spondylolisthesis, lumbar region: Principal | ICD-10-CM | POA: Diagnosis present

## 2015-09-27 DIAGNOSIS — Z01810 Encounter for preprocedural cardiovascular examination: Secondary | ICD-10-CM | POA: Diagnosis not present

## 2015-09-27 DIAGNOSIS — M4806 Spinal stenosis, lumbar region: Secondary | ICD-10-CM | POA: Diagnosis present

## 2015-09-27 DIAGNOSIS — M545 Low back pain: Secondary | ICD-10-CM | POA: Diagnosis not present

## 2015-09-27 DIAGNOSIS — Z823 Family history of stroke: Secondary | ICD-10-CM | POA: Diagnosis not present

## 2015-09-27 DIAGNOSIS — I10 Essential (primary) hypertension: Secondary | ICD-10-CM | POA: Diagnosis not present

## 2015-09-27 DIAGNOSIS — M4326 Fusion of spine, lumbar region: Secondary | ICD-10-CM | POA: Diagnosis not present

## 2015-09-27 DIAGNOSIS — Z01812 Encounter for preprocedural laboratory examination: Secondary | ICD-10-CM

## 2015-09-27 DIAGNOSIS — M5416 Radiculopathy, lumbar region: Secondary | ICD-10-CM | POA: Diagnosis present

## 2015-09-27 DIAGNOSIS — H409 Unspecified glaucoma: Secondary | ICD-10-CM | POA: Diagnosis present

## 2015-09-27 DIAGNOSIS — Z0183 Encounter for blood typing: Secondary | ICD-10-CM

## 2015-09-27 DIAGNOSIS — M5126 Other intervertebral disc displacement, lumbar region: Secondary | ICD-10-CM | POA: Diagnosis not present

## 2015-09-27 DIAGNOSIS — G4730 Sleep apnea, unspecified: Secondary | ICD-10-CM | POA: Diagnosis present

## 2015-09-27 DIAGNOSIS — K219 Gastro-esophageal reflux disease without esophagitis: Secondary | ICD-10-CM | POA: Diagnosis present

## 2015-09-27 DIAGNOSIS — J449 Chronic obstructive pulmonary disease, unspecified: Secondary | ICD-10-CM | POA: Diagnosis present

## 2015-09-27 DIAGNOSIS — H547 Unspecified visual loss: Secondary | ICD-10-CM | POA: Diagnosis present

## 2015-09-27 DIAGNOSIS — Z419 Encounter for procedure for purposes other than remedying health state, unspecified: Secondary | ICD-10-CM

## 2015-09-27 DIAGNOSIS — M7138 Other bursal cyst, other site: Secondary | ICD-10-CM | POA: Diagnosis not present

## 2015-09-27 DIAGNOSIS — Z7951 Long term (current) use of inhaled steroids: Secondary | ICD-10-CM

## 2015-09-27 DIAGNOSIS — Z888 Allergy status to other drugs, medicaments and biological substances status: Secondary | ICD-10-CM

## 2015-09-27 DIAGNOSIS — I69398 Other sequelae of cerebral infarction: Secondary | ICD-10-CM | POA: Diagnosis not present

## 2015-09-27 DIAGNOSIS — M1990 Unspecified osteoarthritis, unspecified site: Secondary | ICD-10-CM | POA: Diagnosis not present

## 2015-09-27 HISTORY — PX: LUMBAR FUSION: SHX111

## 2015-09-27 HISTORY — PX: BACK SURGERY: SHX140

## 2015-09-27 SURGERY — POSTERIOR LUMBAR FUSION 2 LEVEL
Anesthesia: General | Site: Back

## 2015-09-27 MED ORDER — MENTHOL 3 MG MT LOZG: 1.0000 | LOZENGE | OROMUCOSAL | Status: DC | PRN

## 2015-09-27 MED ORDER — ALBUMIN HUMAN 5 % IV SOLN
INTRAVENOUS | Status: DC | PRN
  Administered 2015-09-27: 15:00:00 via INTRAVENOUS

## 2015-09-27 MED ORDER — OXYCODONE-ACETAMINOPHEN 5-325 MG PO TABS: 2.0000 | ORAL_TABLET | ORAL | Status: DC | PRN

## 2015-09-27 MED ORDER — DIPHENHYDRAMINE HCL 50 MG/ML IJ SOLN: 12.5000 mg | Freq: Four times a day (QID) | INTRAMUSCULAR | Status: DC | PRN

## 2015-09-27 MED ORDER — PROMETHAZINE HCL 25 MG/ML IJ SOLN: 6.2500 mg | INTRAMUSCULAR | Status: DC | PRN

## 2015-09-27 MED ORDER — MIDAZOLAM HCL 5 MG/5ML IJ SOLN
INTRAMUSCULAR | Status: DC | PRN
  Administered 2015-09-27: 2 mg via INTRAVENOUS

## 2015-09-27 MED ORDER — VECURONIUM BROMIDE 10 MG IV SOLR
INTRAVENOUS | Status: DC | PRN
  Administered 2015-09-27 (×5): 2 mg via INTRAVENOUS

## 2015-09-27 MED ORDER — NEOSTIGMINE METHYLSULFATE 10 MG/10ML IV SOLN
INTRAVENOUS | Status: DC | PRN
  Administered 2015-09-27: 4 mg via INTRAVENOUS

## 2015-09-27 MED ORDER — LACTATED RINGERS IV SOLN
INTRAVENOUS | Status: DC | PRN
  Administered 2015-09-27: 14:00:00 via INTRAVENOUS

## 2015-09-27 MED ORDER — LATANOPROST 0.005 % OP SOLN
1.0000 [drp] | Freq: Every day | OPHTHALMIC | Status: DC
  Administered 2015-09-27 – 2015-09-29 (×3): 1 [drp] via OPHTHALMIC
  Filled 2015-09-27 (×2): qty 2.5

## 2015-09-27 MED ORDER — ONDANSETRON HCL 4 MG/2ML IJ SOLN
INTRAMUSCULAR | Status: DC | PRN
  Administered 2015-09-27: 4 mg via INTRAVENOUS

## 2015-09-27 MED ORDER — THROMBIN 20000 UNITS EX SOLR
CUTANEOUS | Status: DC | PRN
  Administered 2015-09-27: 13:00:00 via TOPICAL

## 2015-09-27 MED ORDER — MONTELUKAST SODIUM 10 MG PO TABS
10.0000 mg | ORAL_TABLET | Freq: Every day | ORAL | Status: DC
  Administered 2015-09-28 – 2015-09-30 (×3): 10 mg via ORAL
  Filled 2015-09-27 (×4): qty 1

## 2015-09-27 MED ORDER — MORPHINE SULFATE 2 MG/ML IV SOLN
INTRAVENOUS | Status: DC
  Administered 2015-09-27: 20:00:00 via INTRAVENOUS
  Administered 2015-09-28: 0 mg via INTRAVENOUS
  Administered 2015-09-28: 1.5 mg via INTRAVENOUS

## 2015-09-27 MED ORDER — THROMBIN 5000 UNITS EX SOLR
OROMUCOSAL | Status: DC | PRN
  Administered 2015-09-27 (×3): via TOPICAL

## 2015-09-27 MED ORDER — ONDANSETRON HCL 4 MG/2ML IJ SOLN
INTRAMUSCULAR | Status: AC
  Filled 2015-09-27: qty 2

## 2015-09-27 MED ORDER — ARTIFICIAL TEARS OP OINT
TOPICAL_OINTMENT | OPHTHALMIC | Status: DC | PRN
  Administered 2015-09-27: 1 via OPHTHALMIC

## 2015-09-27 MED ORDER — BUPIVACAINE LIPOSOME 1.3 % IJ SUSP
20.0000 mL | Freq: Once | INTRAMUSCULAR | Status: DC
  Filled 2015-09-27: qty 20

## 2015-09-27 MED ORDER — GLYCOPYRROLATE 0.2 MG/ML IJ SOLN
INTRAMUSCULAR | Status: AC
  Filled 2015-09-27: qty 1

## 2015-09-27 MED ORDER — SODIUM CHLORIDE 0.9 % IJ SOLN: 3.0000 mL | INTRAMUSCULAR | Status: DC | PRN

## 2015-09-27 MED ORDER — VANCOMYCIN HCL 1000 MG IV SOLR
INTRAVENOUS | Status: AC
  Filled 2015-09-27: qty 1000

## 2015-09-27 MED ORDER — NIACIN ER (ANTIHYPERLIPIDEMIC) 1000 MG PO TBCR: 1000.0000 mg | EXTENDED_RELEASE_TABLET | Freq: Every day | ORAL | Status: DC

## 2015-09-27 MED ORDER — VANCOMYCIN HCL 1000 MG IV SOLR
INTRAVENOUS | Status: DC | PRN
  Administered 2015-09-27: 1000 mg via TOPICAL

## 2015-09-27 MED ORDER — PROPOFOL 10 MG/ML IV BOLUS
INTRAVENOUS | Status: DC | PRN
  Administered 2015-09-27: 30 mg via INTRAVENOUS
  Administered 2015-09-27: 150 mg via INTRAVENOUS

## 2015-09-27 MED ORDER — LACTATED RINGERS IV SOLN
INTRAVENOUS | Status: DC | PRN
  Administered 2015-09-27 (×3): via INTRAVENOUS

## 2015-09-27 MED ORDER — DIPHENHYDRAMINE HCL 12.5 MG/5ML PO ELIX: 12.5000 mg | ORAL_SOLUTION | Freq: Four times a day (QID) | ORAL | Status: DC | PRN

## 2015-09-27 MED ORDER — DEXAMETHASONE SODIUM PHOSPHATE 4 MG/ML IJ SOLN
INTRAMUSCULAR | Status: AC
  Filled 2015-09-27: qty 1

## 2015-09-27 MED ORDER — KETOROLAC TROMETHAMINE 0.5 % OP SOLN
1.0000 [drp] | Freq: Four times a day (QID) | OPHTHALMIC | Status: DC
  Administered 2015-09-27 – 2015-09-30 (×10): 1 [drp] via OPHTHALMIC
  Filled 2015-09-27: qty 3

## 2015-09-27 MED ORDER — SODIUM CHLORIDE 0.9 % IJ SOLN
3.0000 mL | Freq: Two times a day (BID) | INTRAMUSCULAR | Status: DC
Start: 2015-09-27 — End: 2015-09-29
  Administered 2015-09-27 – 2015-09-28 (×2): 3 mL via INTRAVENOUS

## 2015-09-27 MED ORDER — CEFAZOLIN SODIUM 1-5 GM-% IV SOLN
1.0000 g | Freq: Three times a day (TID) | INTRAVENOUS | Status: AC
  Administered 2015-09-28 (×2): 1 g via INTRAVENOUS
  Filled 2015-09-27 (×2): qty 50

## 2015-09-27 MED ORDER — EPHEDRINE SULFATE 50 MG/ML IJ SOLN
INTRAMUSCULAR | Status: AC
Start: 2015-09-27 — End: 2015-09-27
  Filled 2015-09-27: qty 1

## 2015-09-27 MED ORDER — ACETAMINOPHEN 325 MG PO TABS
650.0000 mg | ORAL_TABLET | ORAL | Status: DC | PRN
  Administered 2015-09-29 – 2015-09-30 (×4): 650 mg via ORAL
  Filled 2015-09-27 (×5): qty 2

## 2015-09-27 MED ORDER — FENTANYL CITRATE (PF) 250 MCG/5ML IJ SOLN
INTRAMUSCULAR | Status: AC
  Filled 2015-09-27: qty 5

## 2015-09-27 MED ORDER — PANTOPRAZOLE SODIUM 40 MG PO TBEC
40.0000 mg | DELAYED_RELEASE_TABLET | Freq: Every day | ORAL | Status: DC
  Administered 2015-09-28 – 2015-09-30 (×3): 40 mg via ORAL
  Filled 2015-09-27 (×3): qty 1

## 2015-09-27 MED ORDER — NALOXONE HCL 0.4 MG/ML IJ SOLN: 0.4000 mg | INTRAMUSCULAR | Status: DC | PRN

## 2015-09-27 MED ORDER — HYDROCHLOROTHIAZIDE 12.5 MG PO CAPS
12.5000 mg | ORAL_CAPSULE | Freq: Every day | ORAL | Status: DC
  Administered 2015-09-29 – 2015-09-30 (×2): 12.5 mg via ORAL
  Filled 2015-09-27 (×3): qty 1

## 2015-09-27 MED ORDER — GLYCOPYRROLATE 0.2 MG/ML IJ SOLN
INTRAMUSCULAR | Status: DC | PRN
  Administered 2015-09-27: 0.6 mg via INTRAVENOUS
  Administered 2015-09-27: 0.2 mg via INTRAVENOUS

## 2015-09-27 MED ORDER — CEFAZOLIN SODIUM-DEXTROSE 2-3 GM-% IV SOLR
2.0000 g | INTRAVENOUS | Status: AC
  Administered 2015-09-27 (×2): 2 g via INTRAVENOUS
  Filled 2015-09-27: qty 50

## 2015-09-27 MED ORDER — NIACIN ER 500 MG PO CPCR
1000.0000 mg | ORAL_CAPSULE | Freq: Every day | ORAL | Status: DC
  Administered 2015-09-28 – 2015-09-29 (×2): 1000 mg via ORAL
  Filled 2015-09-27 (×5): qty 2

## 2015-09-27 MED ORDER — DEXAMETHASONE SODIUM PHOSPHATE 4 MG/ML IJ SOLN
INTRAMUSCULAR | Status: DC | PRN
  Administered 2015-09-27: 4 mg via INTRAVENOUS

## 2015-09-27 MED ORDER — EPHEDRINE SULFATE 50 MG/ML IJ SOLN
INTRAMUSCULAR | Status: DC | PRN
  Administered 2015-09-27 (×4): 5 mg via INTRAVENOUS

## 2015-09-27 MED ORDER — PHENOL 1.4 % MT LIQD: 1.0000 | OROMUCOSAL | Status: DC | PRN

## 2015-09-27 MED ORDER — CEFAZOLIN SODIUM-DEXTROSE 2-3 GM-% IV SOLR
INTRAVENOUS | Status: AC
  Filled 2015-09-27: qty 50

## 2015-09-27 MED ORDER — HYDROMORPHONE HCL 1 MG/ML IJ SOLN: 0.2500 mg | INTRAMUSCULAR | Status: DC | PRN

## 2015-09-27 MED ORDER — MIDAZOLAM HCL 2 MG/2ML IJ SOLN
INTRAMUSCULAR | Status: AC
  Filled 2015-09-27: qty 2

## 2015-09-27 MED ORDER — ACETAMINOPHEN 650 MG RE SUPP: 650.0000 mg | RECTAL | Status: DC | PRN

## 2015-09-27 MED ORDER — SODIUM CHLORIDE 0.9 % IJ SOLN: 9.0000 mL | INTRAMUSCULAR | Status: DC | PRN

## 2015-09-27 MED ORDER — BUPIVACAINE LIPOSOME 1.3 % IJ SUSP
INTRAMUSCULAR | Status: DC | PRN
  Administered 2015-09-27: 20 mL

## 2015-09-27 MED ORDER — SODIUM CHLORIDE 0.9 % IV SOLN
250.0000 mL | INTRAVENOUS | Status: DC
Start: 2015-09-27 — End: 2015-09-29

## 2015-09-27 MED ORDER — EZETIMIBE 10 MG PO TABS
10.0000 mg | ORAL_TABLET | Freq: Every day | ORAL | Status: DC
  Administered 2015-09-28 – 2015-09-30 (×3): 10 mg via ORAL
  Filled 2015-09-27 (×4): qty 1

## 2015-09-27 MED ORDER — LIDOCAINE HCL (CARDIAC) 20 MG/ML IV SOLN
INTRAVENOUS | Status: DC | PRN
  Administered 2015-09-27: 60 mg via INTRAVENOUS

## 2015-09-27 MED ORDER — HYDROCODONE-ACETAMINOPHEN 7.5-325 MG PO TABS
1.0000 | ORAL_TABLET | Freq: Once | ORAL | Status: DC | PRN
Start: 2015-09-27 — End: 2015-09-27

## 2015-09-27 MED ORDER — BACITRACIN ZINC 500 UNIT/GM EX OINT
TOPICAL_OINTMENT | CUTANEOUS | Status: DC | PRN
Start: 2015-09-27 — End: 2015-09-27
  Administered 2015-09-27: 1 via TOPICAL

## 2015-09-27 MED ORDER — ALBUTEROL SULFATE (2.5 MG/3ML) 0.083% IN NEBU: 2.5000 mg | INHALATION_SOLUTION | Freq: Four times a day (QID) | RESPIRATORY_TRACT | Status: DC | PRN

## 2015-09-27 MED ORDER — 0.9 % SODIUM CHLORIDE (POUR BTL) OPTIME
TOPICAL | Status: DC | PRN
  Administered 2015-09-27 (×2): 1000 mL

## 2015-09-27 MED ORDER — SODIUM CHLORIDE 0.9 % IV SOLN
INTRAVENOUS | Status: DC
  Administered 2015-09-27: 20:00:00 via INTRAVENOUS

## 2015-09-27 MED ORDER — STERILE WATER FOR INJECTION IJ SOLN
INTRAMUSCULAR | Status: AC
  Filled 2015-09-27: qty 10

## 2015-09-27 MED ORDER — ROCURONIUM BROMIDE 100 MG/10ML IV SOLN
INTRAVENOUS | Status: DC | PRN
  Administered 2015-09-27: 50 mg via INTRAVENOUS

## 2015-09-27 MED ORDER — ARTIFICIAL TEARS OP OINT
TOPICAL_OINTMENT | OPHTHALMIC | Status: AC
  Filled 2015-09-27: qty 3.5

## 2015-09-27 MED ORDER — IRBESARTAN 150 MG PO TABS
150.0000 mg | ORAL_TABLET | Freq: Every day | ORAL | Status: DC
  Administered 2015-09-29 – 2015-09-30 (×2): 150 mg via ORAL
  Filled 2015-09-27 (×3): qty 1

## 2015-09-27 MED ORDER — LACTATED RINGERS IV SOLN
INTRAVENOUS | Status: DC
  Administered 2015-09-27: 10:00:00 via INTRAVENOUS

## 2015-09-27 MED ORDER — ROCURONIUM BROMIDE 50 MG/5ML IV SOLN
INTRAVENOUS | Status: AC
  Filled 2015-09-27: qty 1

## 2015-09-27 MED ORDER — ALUM & MAG HYDROXIDE-SIMETH 200-200-20 MG/5ML PO SUSP
30.0000 mL | Freq: Four times a day (QID) | ORAL | Status: DC | PRN
  Administered 2015-09-29: 30 mL via ORAL
  Filled 2015-09-27: qty 30

## 2015-09-27 MED ORDER — PHENYLEPHRINE HCL 10 MG/ML IJ SOLN
10.0000 mg | INTRAVENOUS | Status: DC | PRN
  Administered 2015-09-27: 20 ug/min via INTRAVENOUS

## 2015-09-27 MED ORDER — GLYCOPYRROLATE 0.2 MG/ML IJ SOLN
INTRAMUSCULAR | Status: AC
  Filled 2015-09-27: qty 3

## 2015-09-27 MED ORDER — ONDANSETRON HCL 4 MG/2ML IJ SOLN: 4.0000 mg | INTRAMUSCULAR | Status: DC | PRN

## 2015-09-27 MED ORDER — ALBUTEROL SULFATE HFA 108 (90 BASE) MCG/ACT IN AERS: 2.0000 | INHALATION_SPRAY | Freq: Four times a day (QID) | RESPIRATORY_TRACT | Status: DC | PRN

## 2015-09-27 MED ORDER — ONDANSETRON HCL 4 MG/2ML IJ SOLN: 4.0000 mg | Freq: Four times a day (QID) | INTRAMUSCULAR | Status: DC | PRN

## 2015-09-27 MED ORDER — FENTANYL CITRATE (PF) 100 MCG/2ML IJ SOLN
INTRAMUSCULAR | Status: DC | PRN
  Administered 2015-09-27 (×3): 50 ug via INTRAVENOUS
  Administered 2015-09-27: 150 ug via INTRAVENOUS
  Administered 2015-09-27: 50 ug via INTRAVENOUS

## 2015-09-27 MED ORDER — LIDOCAINE HCL (CARDIAC) 20 MG/ML IV SOLN
INTRAVENOUS | Status: AC
  Filled 2015-09-27: qty 5

## 2015-09-27 MED ORDER — MOMETASONE FURO-FORMOTEROL FUM 100-5 MCG/ACT IN AERO
2.0000 | INHALATION_SPRAY | Freq: Two times a day (BID) | RESPIRATORY_TRACT | Status: DC
  Administered 2015-09-27 – 2015-09-30 (×6): 2 via RESPIRATORY_TRACT
  Filled 2015-09-27: qty 8.8

## 2015-09-27 MED ORDER — SODIUM CHLORIDE 0.9 % IV SOLN
INTRAVENOUS | Status: DC | PRN
  Administered 2015-09-27: 15:00:00 via INTRAVENOUS

## 2015-09-27 MED ORDER — VECURONIUM BROMIDE 10 MG IV SOLR
INTRAVENOUS | Status: AC
  Filled 2015-09-27: qty 10

## 2015-09-27 MED ORDER — MORPHINE SULFATE 2 MG/ML IV SOLN
INTRAVENOUS | Status: AC
  Filled 2015-09-27: qty 25

## 2015-09-27 MED ORDER — VALSARTAN-HYDROCHLOROTHIAZIDE 160-12.5 MG PO TABS: 1.0000 | ORAL_TABLET | Freq: Every day | ORAL | Status: DC

## 2015-09-27 SURGICAL SUPPLY — 78 items
APL SKNCLS STERI-STRIP NONHPOA (GAUZE/BANDAGES/DRESSINGS) ×1
BENZOIN TINCTURE PRP APPL 2/3 (GAUZE/BANDAGES/DRESSINGS) ×3 IMPLANT
BLADE CLIPPER SURG (BLADE) IMPLANT
BUR ACORN 6.0 (BURR) ×2 IMPLANT
BUR ACORN 6.0MM (BURR) ×1
BUR MATCHSTICK NEURO 3.0 LAGG (BURR) ×3 IMPLANT
CANISTER SUCT 3000ML PPV (MISCELLANEOUS) ×3 IMPLANT
CAP LOCKING THREADED (Cap) ×12 IMPLANT
CLOSURE WOUND 1/2 X4 (GAUZE/BANDAGES/DRESSINGS) ×1
CONT SPEC 4OZ CLIKSEAL STRL BL (MISCELLANEOUS) ×3 IMPLANT
COVER BACK TABLE 60X90IN (DRAPES) ×3 IMPLANT
DRAPE C-ARM 42X72 X-RAY (DRAPES) ×6 IMPLANT
DRAPE LAPAROTOMY 100X72X124 (DRAPES) ×3 IMPLANT
DRAPE POUCH INSTRU U-SHP 10X18 (DRAPES) ×3 IMPLANT
DRSG OPSITE POSTOP 4X8 (GAUZE/BANDAGES/DRESSINGS) ×2 IMPLANT
DRSG PAD ABDOMINAL 8X10 ST (GAUZE/BANDAGES/DRESSINGS) ×2 IMPLANT
DURAPREP 26ML APPLICATOR (WOUND CARE) ×3 IMPLANT
ELECT BLADE 4.0 EZ CLEAN MEGAD (MISCELLANEOUS) ×6
ELECT REM PT RETURN 9FT ADLT (ELECTROSURGICAL) ×3
ELECTRODE BLDE 4.0 EZ CLN MEGD (MISCELLANEOUS) IMPLANT
ELECTRODE REM PT RTRN 9FT ADLT (ELECTROSURGICAL) ×1 IMPLANT
EVACUATOR 1/8 PVC DRAIN (DRAIN) IMPLANT
EVACUATOR 3/16  PVC DRAIN (DRAIN) ×2
EVACUATOR 3/16 PVC DRAIN (DRAIN) IMPLANT
GAUZE SPONGE 4X4 12PLY STRL (GAUZE/BANDAGES/DRESSINGS) ×3 IMPLANT
GAUZE SPONGE 4X4 16PLY XRAY LF (GAUZE/BANDAGES/DRESSINGS) ×3 IMPLANT
GLOVE BIOGEL M 8.0 STRL (GLOVE) ×5 IMPLANT
GLOVE BIOGEL PI IND STRL 7.5 (GLOVE) IMPLANT
GLOVE BIOGEL PI IND STRL 8 (GLOVE) IMPLANT
GLOVE BIOGEL PI INDICATOR 7.5 (GLOVE) ×4
GLOVE BIOGEL PI INDICATOR 8 (GLOVE) ×2
GLOVE ECLIPSE 7.5 STRL STRAW (GLOVE) ×8 IMPLANT
GLOVE EXAM NITRILE LRG STRL (GLOVE) IMPLANT
GLOVE EXAM NITRILE MD LF STRL (GLOVE) IMPLANT
GLOVE EXAM NITRILE XL STR (GLOVE) IMPLANT
GLOVE EXAM NITRILE XS STR PU (GLOVE) IMPLANT
GOWN STRL REUS W/ TWL LRG LVL3 (GOWN DISPOSABLE) ×1 IMPLANT
GOWN STRL REUS W/ TWL XL LVL3 (GOWN DISPOSABLE) IMPLANT
GOWN STRL REUS W/TWL 2XL LVL3 (GOWN DISPOSABLE) IMPLANT
GOWN STRL REUS W/TWL LRG LVL3 (GOWN DISPOSABLE) ×3
GOWN STRL REUS W/TWL XL LVL3 (GOWN DISPOSABLE) ×9
HEMOSTAT POWDER KIT SURGIFOAM (HEMOSTASIS) ×6 IMPLANT
IMPLANT RISE 8X22 9-15MM (Neuro Prosthesis/Implant) ×4 IMPLANT
KIT BASIN OR (CUSTOM PROCEDURE TRAY) ×3 IMPLANT
KIT INFUSE MEDIUM (Orthopedic Implant) ×2 IMPLANT
KIT ROOM TURNOVER OR (KITS) ×3 IMPLANT
NDL HYPO 18GX1.5 BLUNT FILL (NEEDLE) IMPLANT
NDL HYPO 21X1.5 SAFETY (NEEDLE) IMPLANT
NDL HYPO 25X1 1.5 SAFETY (NEEDLE) IMPLANT
NEEDLE HYPO 18GX1.5 BLUNT FILL (NEEDLE) IMPLANT
NEEDLE HYPO 21X1.5 SAFETY (NEEDLE) ×3 IMPLANT
NEEDLE HYPO 25X1 1.5 SAFETY (NEEDLE) ×3 IMPLANT
NS IRRIG 1000ML POUR BTL (IV SOLUTION) ×5 IMPLANT
PACK LAMINECTOMY NEURO (CUSTOM PROCEDURE TRAY) ×3 IMPLANT
PAD ARMBOARD 7.5X6 YLW CONV (MISCELLANEOUS) ×9 IMPLANT
PATTIES SURGICAL .5 X1 (DISPOSABLE) ×3 IMPLANT
PATTIES SURGICAL .5 X3 (DISPOSABLE) IMPLANT
PATTIES SURGICAL 1X1 (DISPOSABLE) ×2 IMPLANT
ROD 85MM SPINAL (Rod) ×4 IMPLANT
SCREW 6.5X45 (Screw) ×12 IMPLANT
SEALER BIPOLAR AQUA 6.0 (INSTRUMENTS) ×2 IMPLANT
SPACER CROSSLINK 58-70 (Spacer) ×2 IMPLANT
SPACER RISE 8X22 11-17MM-15 (Spacer) ×4 IMPLANT
SPONGE LAP 4X18 X RAY DECT (DISPOSABLE) IMPLANT
SPONGE NEURO XRAY DETECT 1X3 (DISPOSABLE) IMPLANT
SPONGE SURGIFOAM ABS GEL 100 (HEMOSTASIS) ×3 IMPLANT
STAPLER VISISTAT 35W (STAPLE) ×2 IMPLANT
STRIP CLOSURE SKIN 1/2X4 (GAUZE/BANDAGES/DRESSINGS) ×2 IMPLANT
SUT VIC AB 1 CT1 18XBRD ANBCTR (SUTURE) ×2 IMPLANT
SUT VIC AB 1 CT1 8-18 (SUTURE) ×6
SUT VIC AB 2-0 CP2 18 (SUTURE) ×5 IMPLANT
SUT VIC AB 3-0 SH 8-18 (SUTURE) ×3 IMPLANT
SYR 5ML LL (SYRINGE) IMPLANT
TOWEL OR 17X24 6PK STRL BLUE (TOWEL DISPOSABLE) ×3 IMPLANT
TOWEL OR 17X26 10 PK STRL BLUE (TOWEL DISPOSABLE) ×3 IMPLANT
TRAY FOLEY CATH 16FRSI W/METER (SET/KITS/TRAYS/PACK) ×2 IMPLANT
TRAY FOLEY W/METER SILVER 14FR (SET/KITS/TRAYS/PACK) ×1 IMPLANT
WATER STERILE IRR 1000ML POUR (IV SOLUTION) ×3 IMPLANT

## 2015-09-27 NOTE — Anesthesia Procedure Notes (Signed)
Procedure Name: Intubation Date/Time: 09/27/2015 12:08 PM Performed by: Garrison Columbus T Pre-anesthesia Checklist: Patient identified, Emergency Drugs available, Suction available and Patient being monitored Patient Re-evaluated:Patient Re-evaluated prior to inductionOxygen Delivery Method: Circle system utilized Preoxygenation: Pre-oxygenation with 100% oxygen Intubation Type: IV induction Ventilation: Mask ventilation without difficulty and Oral airway inserted - appropriate to patient size Laryngoscope Size: Sabra Heck and 2 Grade View: Grade I Tube type: Oral Tube size: 7.5 mm Number of attempts: 1 Airway Equipment and Method: Stylet and Oral airway Placement Confirmation: ETT inserted through vocal cords under direct vision,  positive ETCO2 and breath sounds checked- equal and bilateral Secured at: 23 cm Tube secured with: Tape Dental Injury: Teeth and Oropharynx as per pre-operative assessment

## 2015-09-27 NOTE — Transfer of Care (Signed)
Immediate Anesthesia Transfer of Care Note  Patient: Juan Johnson  Procedure(s) Performed: Procedure(s) with comments: Lumbar three-four, Lumbar four-five Posterior lumbar interbody fusion (N/A) - Lumbar three-four, Lumbar four-five Posterior lumbar interbody fusion  Patient Location: PACU  Anesthesia Type:General  Level of Consciousness: awake, alert  and oriented  Airway & Oxygen Therapy: Patient Spontanous Breathing  Post-op Assessment: Report given to RN, Post -op Vital signs reviewed and stable and Patient moving all extremities X 4  Post vital signs: Reviewed and stable  Last Vitals:  Filed Vitals:   09/27/15 1001  BP: 122/51  Pulse: 54  Temp: 36.8 C  Resp: 18    Complications: No apparent anesthesia complications

## 2015-09-27 NOTE — Anesthesia Preprocedure Evaluation (Addendum)
Anesthesia Evaluation  Patient identified by MRN, date of birth, ID band Patient awake    Reviewed: Allergy & Precautions, NPO status , Patient's Chart, lab work & pertinent test results  Airway Mallampati: II  TM Distance: >3 FB Neck ROM: Full    Dental  (+) Dental Advisory Given   Pulmonary sleep apnea , COPD,  COPD inhaler, former smoker,    breath sounds clear to auscultation       Cardiovascular hypertension, Pt. on medications  Rhythm:Regular Rate:Normal     Neuro/Psych Anxiety Depression CVA    GI/Hepatic Neg liver ROS, GERD  ,  Endo/Other  negative endocrine ROS  Renal/GU negative Renal ROS     Musculoskeletal  (+) Arthritis ,   Abdominal   Peds  Hematology negative hematology ROS (+)   Anesthesia Other Findings   Reproductive/Obstetrics                              Component Value Date/Time   WBC 8.3 09/25/2015 1016   RBC 4.25 09/25/2015 1016   HGB 13.1 09/25/2015 1016   HCT 39.4 09/25/2015 1016   PLT 221 09/25/2015 1016      Component Value Date/Time   NA 138 09/25/2015 1016   NA 142 08/08/2015 1242   K 4.0 09/25/2015 1016   CL 102 09/25/2015 1016   CO2 29 09/25/2015 1016   BUN 11 09/25/2015 1016   BUN 12 08/08/2015 1242   CREATININE 0.99 09/25/2015 1016   CALCIUM 9.5 09/25/2015 1016   ALKPHOS 69 06/24/2014 1306   AST 25 06/24/2014 1306   ALT 24 06/24/2014 1306   BILITOT 0.4 06/24/2014 1306      Anesthesia Physical Anesthesia Plan  ASA: III  Anesthesia Plan: General   Post-op Pain Management:    Induction: Intravenous  Airway Management Planned: Oral ETT  Additional Equipment:   Intra-op Plan:   Post-operative Plan: Extubation in OR  Informed Consent: I have reviewed the patients History and Physical, chart, labs and discussed the procedure including the risks, benefits and alternatives for the proposed anesthesia with the patient or authorized  representative who has indicated his/her understanding and acceptance.   Dental advisory given  Plan Discussed with: CRNA  Anesthesia Plan Comments:         Anesthesia Quick Evaluation

## 2015-09-28 LAB — POCT I-STAT EG7
ACID-BASE DEFICIT: 3 mmol/L — AB (ref 0.0–2.0)
ACID-BASE DEFICIT: 3 mmol/L — AB (ref 0.0–2.0)
BICARBONATE: 22.1 meq/L (ref 20.0–24.0)
Bicarbonate: 22.7 mEq/L (ref 20.0–24.0)
CALCIUM ION: 1.17 mmol/L (ref 1.13–1.30)
CALCIUM ION: 1.18 mmol/L (ref 1.13–1.30)
HCT: 30 % — ABNORMAL LOW (ref 39.0–52.0)
HEMATOCRIT: 29 % — AB (ref 39.0–52.0)
HEMOGLOBIN: 9.9 g/dL — AB (ref 13.0–17.0)
Hemoglobin: 10.2 g/dL — ABNORMAL LOW (ref 13.0–17.0)
O2 SAT: 71 %
O2 Saturation: 74 %
PH VEN: 7.354 — AB (ref 7.250–7.300)
PO2 VEN: 41 mmHg (ref 30.0–45.0)
POTASSIUM: 4.3 mmol/L (ref 3.5–5.1)
Patient temperature: 37
Potassium: 4.2 mmol/L (ref 3.5–5.1)
SODIUM: 136 mmol/L (ref 135–145)
SODIUM: 137 mmol/L (ref 135–145)
TCO2: 24 mmol/L (ref 0–100)
TCO2: 23 mmol/L (ref 0–100)
pCO2, Ven: 39.6 mmHg — ABNORMAL LOW (ref 45.0–50.0)
pCO2, Ven: 40.8 mmHg — ABNORMAL LOW (ref 45.0–50.0)
pH, Ven: 7.354 — ABNORMAL HIGH (ref 7.250–7.300)
pO2, Ven: 39 mmHg (ref 30.0–45.0)

## 2015-09-28 LAB — CBC
HEMATOCRIT: 31.1 % — AB (ref 39.0–52.0)
Hemoglobin: 10.1 g/dL — ABNORMAL LOW (ref 13.0–17.0)
MCH: 30.3 pg (ref 26.0–34.0)
MCHC: 32.5 g/dL (ref 30.0–36.0)
MCV: 93.4 fL (ref 78.0–100.0)
PLATELETS: 176 10*3/uL (ref 150–400)
RBC: 3.33 MIL/uL — ABNORMAL LOW (ref 4.22–5.81)
RDW: 12.5 % (ref 11.5–15.5)
WBC: 11 10*3/uL — AB (ref 4.0–10.5)

## 2015-09-28 MED ORDER — OXYCODONE HCL 5 MG PO TABS
15.0000 mg | ORAL_TABLET | ORAL | Status: DC | PRN
  Administered 2015-09-28: 15 mg via ORAL
  Filled 2015-09-28 (×2): qty 3

## 2015-09-28 MED FILL — Heparin Sodium (Porcine) Inj 1000 Unit/ML: INTRAMUSCULAR | Qty: 30 | Status: AC

## 2015-09-28 MED FILL — Sodium Chloride Irrigation Soln 0.9%: Qty: 1000 | Status: AC

## 2015-09-28 MED FILL — Sodium Chloride Irrigation Soln 0.9%: Qty: 3000 | Status: AC

## 2015-09-28 MED FILL — Sodium Chloride IV Soln 0.9%: INTRAVENOUS | Qty: 1000 | Status: AC

## 2015-09-28 NOTE — Progress Notes (Signed)
Pt was able to void

## 2015-09-28 NOTE — Progress Notes (Signed)
Pt arrived to 5c12 from PACU.  Pt is alert and oriented.  Family at bedside.  PCA pump set up with Shirlean Mylar, RN PACU nurse.  Safety measures in place.  Will continue to monitor.  Fredrich Romans, RN

## 2015-09-28 NOTE — Progress Notes (Signed)
Patient ID: Juan Johnson, male   DOB: 09-30-1951, 64 y.o.   MRN: [id number] Doing well, no weakness. Not using pca. Foley out. Still drainage in the hemovac

## 2015-09-28 NOTE — Progress Notes (Signed)
Occupational Therapy Evaluation Patient Details Name: Juan Johnson MRN: [id number] DOB: 1950/12/01 Today's Date: 09/28/2015    History of Present Illness 64 y.o. male s/p bilateral L3-L4 laminectomy and facetectomy, bilateral L3-4 and L4-5 diskectomy, and L3-L5 posterolateral arthrodesis. 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   Clinical Impression   PTA, pt was independent with ADLs and functional mobility. Pt currently present with acute back pain and bilateral peripheral vision loss from a previous CVA. Pt completed ADL tasks and transfers with min--min guard assist. Reviewed back precautions and pt required mod verbal cues to adhere to them during self-care tasks. Pt will benefit from acute skilled OT increase independence and safety with ADLs and mobility and allow for safe discharge home with assist from family. No OT follow up recommended.     Follow Up Recommendations  No OT follow up;Supervision/Assistance - 24 hour    Equipment Recommendations  Other (comment) (RW-2 wheeled)    Recommendations for Other Services       Precautions / Restrictions Precautions Precautions: Back;Fall Precaution Booklet Issued: No Precaution Comments: Reviewed back precautions. Pt able to recall 2/3 back precautions at end of session Required Braces or Orthoses: Spinal Brace Spinal Brace: Lumbar corset;Applied in sitting position Restrictions Weight Bearing Restrictions: No      Mobility Bed Mobility Overal bed mobility: Needs Assistance Bed Mobility: Rolling;Sidelying to Sit Rolling: Min guard Sidelying to sit: Mod assist       General bed mobility comments: HOB flat, no use of bed rails to simulate home environment. Mod assist to support trunk to come to sitting position with son assisting. Verbal cues for log roll technique  Transfers Overall transfer level: Needs  assistance Equipment used: Rolling walker (2 wheeled) Transfers: Sit to/from Stand Sit to Stand: Min assist         General transfer comment: Min assist for boost to stand. Verbal cues for safe hand placement upon standing and before sitting. Verbal cues to adhere to back precautions and avoid twisting to look at surface before sitting    Balance Overall balance assessment: Needs assistance Sitting-balance support: No upper extremity supported;Feet supported Sitting balance-Leahy Scale: Fair Sitting balance - Comments: Requires occassional support while sitting due to increased back pain   Standing balance support: Bilateral upper extremity supported;During functional activity Standing balance-Leahy Scale: Poor Standing balance comment: Heavy reliance on RW for UE support. Bilateral knee buckling while standing to void bladder and when at sink                            ADL Overall ADL's : Needs assistance/impaired     Grooming: Wash/dry hands;Min guard;Cueing for safety Grooming Details (indicate cue type and reason): Verbal cues to avoid resting elbows on sink and bending         Upper Body Dressing : Minimal assistance;With caregiver independent assisting;Sitting Upper Body Dressing Details (indicate cue type and reason): Donning back brace     Toilet Transfer: Min guard;With caregiver independent assisting;Ambulation;RW Toilet Transfer Details (indicate cue type and reason): Standing to void bladder Toileting- Clothing Manipulation and Hygiene: Min guard;With caregiver independent assisting;Adhering to back precautions;Sit to/from stand       Functional mobility during ADLs: Min guard;Rolling walker General ADL Comments: Min guard assist for mobility during ADLs for bilateral LE weakness and knee buckling. Mod verbal cues for safe use of RW,  hand placement on seated surfaces, and to adhere to back precautions. Wife and son present and son very willing to  assist.     Vision Vision Assessment?: Yes Eye Alignment: Within Functional Limits Ocular Range of Motion: Within Functional Limits Alignment/Gaze Preference: Within Defined Limits Tracking/Visual Pursuits: Able to track stimulus in all quads without difficulty Convergence: Within functional limits Visual Fields: Other (comment) (Peripheral visual field loss from CVA) Additional Comments: Bilateral peripheral field loss of about 70 degrees on each side from CVA in 2015. Has not changed.   Perception     Praxis      Pertinent Vitals/Pain Pain Assessment: 0-10 Pain Score: 6  Pain Location: back Pain Descriptors / Indicators: Aching;Sore;Operative site guarding Pain Intervention(s): Monitored during session;Limited activity within patient's tolerance;Repositioned     Hand Dominance Right   Extremity/Trunk Assessment Upper Extremity Assessment Upper Extremity Assessment: Overall WFL for tasks assessed   Lower Extremity Assessment Lower Extremity Assessment: Defer to PT evaluation   Cervical / Trunk Assessment Cervical / Trunk Assessment: Kyphotic   Communication Communication Communication: No difficulties   Cognition Arousal/Alertness: Awake/alert Behavior During Therapy: WFL for tasks assessed/performed Overall Cognitive Status: Within Functional Limits for tasks assessed       Memory: Decreased recall of precautions             General Comments       Exercises       Shoulder Instructions      Home Living Family/patient expects to be discharged to:: Private residence Living Arrangements: Spouse/significant other Available Help at Discharge: Family;Available 24 hours/day Type of Home: House Home Access: Stairs to enter [employer] of Steps: 4 Entrance Stairs-Rails: None (On front entrance) Home Layout: One level     Bathroom Shower/Tub: Walk-in shower;Door   Bathroom Toilet: Handicapped height     Home Equipment: Cane - single  point;Hand held shower head;Grab bars - tub/shower          Prior Functioning/Environment Level of Independence: Independent        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    OT Diagnosis: Generalized weakness;Acute pain   OT Problem List: Decreased strength;Decreased range of motion;Decreased activity tolerance;Impaired balance (sitting and/or standing);Impaired vision/perception;Decreased coordination;Decreased safety awareness;Decreased knowledge of use of DME or AE;Decreased knowledge of precautions;Obesity;Pain   OT Treatment/Interventions: Self-care/ADL training;Therapeutic exercise;DME and/or AE instruction;Therapeutic activities;Visual/perceptual remediation/compensation;Patient/family education;Balance training    OT Goals(Current goals can be found in the care plan section) Acute Rehab OT Goals 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 OT Goal Formulation: With patient Time For Goal Achievement: 10/12/15 Potential to Achieve Goals: Good ADL Goals Pt Will Perform Grooming: with modified independence;standing Pt Will Perform Lower Body Bathing: with modified independence;with adaptive equipment;sitting/lateral leans Pt Will Perform Lower Body Dressing: with modified independence;with adaptive equipment;sit to/from stand Pt Will Transfer to Toilet: ambulating;regular height toilet;grab bars;with supervision Pt Will Perform Toileting - Clothing Manipulation and hygiene: sitting/lateral leans;with adaptive equipment;sit to/from stand;with supervision Pt Will Perform Tub/Shower Transfer: Shower transfer;ambulating;with supervision;shower seat;grab bars;rolling walker Additional ADL Goal #1: Pt will don/doff back brace independently to increase safety with ADLs and mobility. Additional ADL Goal #2: Pt wil demonstrate understanding of 3/3 back precautions to increase safety with ADLs and mobility.  OT Frequency: Min 2X/week   Barriers to D/C:             Co-evaluation              End of Session Equipment Utilized  During Treatment: Gait belt;Rolling walker;Back brace Nurse Communication: Mobility status;Precautions  Activity Tolerance: Patient tolerated treatment well Patient left: in chair;with call bell/phone within reach;with chair alarm set;with family/visitor present   Time: [id number] OT Time Calculation (min): 34 min Charges:  OT General Charges $OT Visit: 1 Procedure OT Evaluation $Initial OT Evaluation Tier I: 1 Procedure OT Treatments $Self Care/Home Management : 8-22 mins G-Codes:    Redmond Baseman, OTR/L 10/28/15, 12:30 PM

## 2015-09-28 NOTE — Evaluation (Signed)
Physical Therapy Evaluation Patient Details Name: Juan Johnson MRN: [id number] DOB: 07-11-1951 Today's Date: 09/28/2015   History of Present Illness  64 y.o. male s/p bilateral L3-L4 laminectomy and facetectomy, bilateral L3-4 and L4-5 diskectomy, and L3-L5 posterolateral arthrodesis. 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    Clinical Impression  Patient is s/p above surgery resulting in the deficits listed below (see PT Problem List). Very lethargic/sleepy during session. Will need further instruction in precautions and proper ways to move.  Patient will benefit from skilled PT to increase their independence and safety with mobility (while adhering to their precautions) to allow discharge to the venue listed below.     Follow Up Recommendations Home health PT;Supervision for mobility/OOB    Equipment Recommendations  Rolling walker with 5" wheels    Recommendations for Other Services       Precautions / Restrictions Precautions Precautions: Back;Fall Precaution Booklet Issued: Yes (comment) Precaution Comments: Reviewed back precautions. Pt able to recall 2/3 back precautions  Required Braces or Orthoses: Spinal Brace Spinal Brace: Lumbar corset;Applied in sitting position Restrictions Weight Bearing Restrictions: No      Mobility  Bed Mobility Overal bed mobility: Needs Assistance Bed Mobility: Rolling;Sit to Sidelying Rolling: Min guard Sidelying to sit: Mod assist     Sit to sidelying: Mod assist General bed mobility comments: max cues due to lethargy to maintain precautions  Transfers Overall transfer level: Needs assistance Equipment used: Rolling walker (2 wheeled) Transfers: Sit to/from Stand Sit to Stand: Min assist         General transfer comment: incr vc for bil hand placement on armrests and then reach to RW; excessive hip  flexion  Ambulation/Gait Ambulation/Gait assistance: Min assist Ambulation Distance (Feet): 10 Feet (coming out of bathroom on arrival) Assistive device: Rolling walker (2 wheeled) Gait Pattern/deviations: Step-to pattern;Trunk flexed;Decreased stride length   Gait velocity interpretation: Below normal speed for age/gender General Gait Details: pt pushing RW too far ahead due to his stooped posture  Stairs            Wheelchair Mobility    Modified Rankin (Stroke Patients Only)       Balance Overall balance assessment: Needs assistance Sitting-balance support: No upper extremity supported;Feet supported Sitting balance-Leahy Scale: Fair Sitting balance - Comments: Requires occassional support while sitting due to increased back pain   Standing balance support: Bilateral upper extremity supported Standing balance-Leahy Scale: Poor Standing balance comment: Heavy reliance on UEs                             Pertinent Vitals/Pain Pain Assessment: 0-10 Pain Score: 4  Pain Location: back Pain Descriptors / Indicators: Operative site guarding Pain Intervention(s): Limited activity within patient's tolerance;Monitored during session;Repositioned    Home Living Family/patient expects to be discharged to:: Private residence Living Arrangements: Spouse/significant other Available Help at Discharge: Family;Available 24 hours/day Type of Home: House Home Access: Stairs to enter Entrance Stairs-Rails: None (On front entrance) Entrance Stairs-Number of Steps: 1 (go thru garage) Home Layout: One level Home Equipment: Cane - single point;Hand held shower head;Grab bars - tub/shower      Prior Function Level of Independence: Independent         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     Hand Dominance   Dominant Hand: Right    Extremity/Trunk Assessment  Upper Extremity Assessment: Defer to OT evaluation;Overall [facility] for tasks assessed            Lower Extremity Assessment: Overall WFL for tasks assessed      Cervical / Trunk Assessment: Kyphotic;Other exceptions  Communication   Communication: No difficulties  Cognition Arousal/Alertness: Lethargic Behavior During Therapy: Flat affect (falling asleep mid-sentence) Overall Cognitive Status: Within Functional Limits for tasks assessed       Memory: Decreased recall of precautions              General Comments General comments (skin integrity, edema, etc.): Pt leaving restroom with son assisting on arrival. reviewed with family members he must have a staff member with him at this time    Exercises        Assessment/Plan    PT Assessment Patient needs continued PT services  PT Diagnosis Difficulty walking;Acute pain   PT Problem List Decreased strength;Decreased activity tolerance;Decreased mobility;Decreased balance;Decreased knowledge of use of DME;Decreased knowledge of precautions;Pain  PT Treatment Interventions DME instruction;Gait training;Functional mobility training;Therapeutic activities;Patient/family education   PT Goals (Current goals can be found in the Care Plan section) Acute Rehab PT Goals 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 PT Goal Formulation: With patient Time For Goal Achievement: 10/03/15 Potential to Achieve Goals: Good    Frequency Min 5X/week   Barriers to discharge        Co-evaluation               End of Session Equipment Utilized During Treatment: Back brace Activity Tolerance: Patient limited by lethargy Patient left: in bed;with call bell/phone within reach;with bed alarm set;with family/visitor present Nurse Communication: Mobility status;Other (comment) (unable to void; need to reapply suction to drain)         Time: [id number] PT Time Calculation (min) (ACUTE ONLY): 27 min   Charges:   PT Evaluation $Initial PT Evaluation Tier I: 1 Procedure PT Treatments $Therapeutic Activity: 8-22 mins    PT G Codes:        Analese Sovine 2015/10/12, 1:55 PM Pager [phone]

## 2015-09-28 NOTE — Progress Notes (Signed)
Utilization review completed.  

## 2015-09-28 NOTE — Op Note (Signed)
NAMEMONTERRIO, GAUGH                ACCOUNT NO.:  [id number]  MEDICAL RECORD NO.:  [id number]  LOCATION:  5C12C                        FACILITY:  [facility]  PHYSICIAN:  Leeroy Cha, M.D.   DATE OF BIRTH:  1951/09/06  DATE OF PROCEDURE:  09/27/2015 DATE OF DISCHARGE:                              OPERATIVE REPORT   PREOPERATIVE DIAGNOSES:  L3-L4, and L4-L5 lumbar spondylolisthesis with stenosis and chronic radiculopathy, and facet arthropathy.  POSTOPERATIVE DIAGNOSES:  L3-L4, and L4-L5 lumbar spondylolisthesis with stenosis and chronic radiculopathy, and facet arthropathy.  PROCEDURE:  Bilateral L3 and L4 laminectomy and facetectomy, lysis of adhesion, removal of synovial cyst in the right side at the level of L4 and L5, decompression of the thecal sac, bilateral L3-4 and L4-5 diskectomy more than normal to introduce for expandable cages.  Pedicle screws from lumbar 3-4 and 5 posterolateral arthrodesis with BMP and autograft from L3-L5.  Cell Saver.  ASSISTANT:  Jovita Gamma, MD.  CLINICAL HISTORY:  The patient is a gentleman, who had been complaining of back pain with radiation to__________ both legs which is getting worse lately.  He has failed with conservative treatment.  Myelogram showed he has a stenosis at L3-L4, L4-L5 secondary to unstable spondylolisthesis.  Surgery was advised and he knew the risk and benefit.  The patient had been taking strong anticoagulant after he had a stroke.  The patient took the medication about a week ago.  DESCRIPTION OF PROCEDURE:  The patient was taken to the OR, and after intubation, he was positioned in a prone manner.  The back was cleaned with ChloraPrep.  Midline incision from L2-L3 down to L4-L5 was made and muscles were retracted all the way laterally until we were able to see the takeoff of the transverse process of 3, 4, and 5.  This procedure took [place] an hour because the patient had quite a bit of diffuse bleeding probably  because of the anticoagulants.  Nevertheless, we were using the Cell Saver.  X-ray was taken.  We proceeded with removal of the spinous process of L3, L4, as well as the lamina and the facet.  The facet were quite degenerative with gap between them.  The patient had a lot of adhesion, the worse was at L4-L5 in the right side where he has what looked like a calcified synovial cyst.  Lysis of adhesion, decompression of the thecal sac, as well as the L3, L4, and L5 nerve root was achieved.  Then, we entered the disc space at L3-L4 and then on the right side and then on the left side.  Total diskectomy median and laterally was achieved with good decompression of the nerve root.  The same procedure was done at the level L4-L5.  Then 2 cages at L3-L4 which were expanded to 15 were inserted.  The cages had autograft as well as BMP.  At the level of L4-L5, the cages were expanded all the way up to 18.  The rest of the disk space was filled up using BMP and autograft. Then, using the C-arm first in AP view and then a lateral view, we made holes in the pedicle of L3, L4, and L5.  Prior  to introduction of the screws, we seal all 4 quadrants just to be sure that we were surrounded by bone.  Then, 6 screws of 6.5 x 45 were inserted, kept in place with rods __________ and caps.  A cross-link from right to left was done. AP and lateral views showed good position of the bone graft.  There was a question of object left behind, but the x-ray was done while doing surgery _________. Nevertheless, final x-ray was negative. _after the hardware was in place_________, we went laterally, we removed the periosteum of L3-L4 and L4- L5 and a mix of BMP and autograft were used for arthrodesis.  The area was irrigated.  Valsalva maneuver was negative.  A large drain was left in the epidural space as well as vancomycin powder.  The wound was closed using different layers of Vicryl and staples.           ______________________________ Leeroy Cha, M.D.     EB/MEDQ  D:  09/27/2015  T:  09/28/2015  Job:  [id number]

## 2015-09-28 NOTE — Progress Notes (Signed)
Pt ambulated 50 feet in hallway with brace and walker and tolerated it well.  Pt wanted to go back to bed and did not want to sit up in the chair.  Will continue to monitor.    Fredrich Romans, RN

## 2015-09-28 NOTE — Care Management Note (Signed)
Case Management Note  Patient Details  Name: Juan Johnson MRN: [id number] Date of Birth: 05-27-1951  Subjective/Objective:  Patient admitted with L3-5 PLIF. Patient is from home with spouse.                   Action/Plan: Await PT/OT recommendations. CM will continue to follow for discharge needs.  Expected Discharge Date:                  Expected Discharge Plan:     In-House Referral:     Discharge planning Services     Post Acute Care Choice:    Choice offered to:     DME Arranged:    DME Agency:     HH Arranged:    HH Agency:     Status of Service:  In process, will continue to follow  Medicare Important Message Given:    Date Medicare IM Given:    Medicare IM give by:    Date Additional Medicare IM Given:    Additional Medicare Important Message give by:     If discussed at [place] of Stay Meetings, dates discussed:    Additional Comments:  Pollie Friar, RN 09/28/2015, 11:46 AM

## 2015-09-28 NOTE — Progress Notes (Signed)
   09/28/15 1454  Urine Characteristics  Bladder Scan Volume (mL) 157 mL   Pt refused IN and out catheter at this time. Will scan again in 2 hours.

## 2015-09-28 NOTE — Progress Notes (Signed)
Pt up in chair, foley out.  Will continue to monitor.  Fredrich Romans, RN

## 2015-09-29 NOTE — Clinical Social Work Note (Signed)
CSW received referral for SNF.  Case discussed with case manager, and plan is to discharge home with home health.  CSW to sign off please re-consult if social work needs arise.  Autumne Kallio R. Catheleen Langhorne, MSW, LCSWA [phone]  

## 2015-09-29 NOTE — Progress Notes (Signed)
Occupational Therapy Treatment Patient Details Name: Juan Johnson MRN: [id number] DOB: Mar 05, 1951 Today's Date: 09/29/2015    History of present illness 64 y.o. male s/p bilateral L3-L4 laminectomy and facetectomy, bilateral L3-4 and L4-5 diskectomy, and L3-L5 posterolateral arthrodesis. 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   OT comments  Pt making good progress toward OT goals. Able to perform shower and toilet transfer with min guard assist for safety, maintaining back precautions throughout. Educated on use of reacher for LB dressing; pt able to return demonstrate understanding with min assist, max assist for donning socks. Pt able to recall 3/3 back precautions at start of session. Pt continues to require VCs to maintain back precautions during functional mobility and ADL activities. Will continue to follow pt acutely.    Follow Up Recommendations  No OT follow up;Supervision/Assistance - 24 hour    Equipment Recommendations  Other (comment) (RW)    Recommendations for Other Services      Precautions / Restrictions Precautions Precautions: Back;Fall Precaution Comments: Pt able to recall 3/3 precautions Required Braces or Orthoses: Spinal Brace Spinal Brace: Lumbar corset;Applied in sitting position Restrictions Weight Bearing Restrictions: No       Mobility Bed Mobility               General bed mobility comments: Pt OOB in shower upon arrival, left in chair  Transfers Overall transfer level: Needs assistance Equipment used: Rolling walker (2 wheeled) Transfers: Sit to/from Stand Sit to Stand: Min guard         General transfer comment: VC for technique; feel chair on back of legs before sitting down, reach back to feel chair arms with no twisting    Balance Overall balance assessment: Needs assistance Sitting-balance support: No upper extremity  supported;Feet supported Sitting balance-Leahy Scale: Good     Standing balance support: During functional activity;Single extremity supported Standing balance-Leahy Scale: Poor Standing balance comment: Able to remove one hand at a time from RW to complete UB dressing in standing.                   ADL Overall ADL's : Needs assistance/impaired                 Upper Body Dressing : Minimal assistance;Standing Upper Body Dressing Details (indicate cue type and reason): Min assist to don hospital gown. Lower Body Dressing: Moderate assistance;Sit to/from stand;With adaptive equipment Lower Body Dressing Details (indicate cue type and reason): Max assist to don bilateral socks. 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 Per pt, wife will assist with LB ADLs as needed. Toilet Transfer: Min guard;Ambulation;RW Toilet Transfer Details (indicate cue type and reason): Standing to urinate     Tub/ Shower Transfer: Min guard;Ambulation;3 in 1;Rolling walker Tub/Shower Transfer Details (indicate cue type and reason): Pt able to demonstrate good shower transfer technique maintaining back precautions throughout. Functional mobility during ADLs: Min guard;Rolling walker General ADL Comments: No family present during OT session. Pt able to maintain back precautions throughout functional mobility and ADL activities.       Vision                     Perception     Praxis      Cognition   Behavior During Therapy: [facility] for tasks assessed/performed Overall Cognitive Status:  Within Functional Limits for tasks assessed                       Extremity/Trunk Assessment               Exercises     Shoulder Instructions       General Comments      Pertinent Vitals/ Pain       Pain Assessment: No/denies pain  Home Living                                          Prior  Functioning/Environment              Frequency Min 2X/week     Progress Toward Goals  OT Goals(current goals can now be found in the care plan section)  Progress towards OT goals: Progressing toward goals  Acute Rehab OT Goals 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

## 2015-09-29 NOTE — Progress Notes (Signed)
Patient ID: Juan Johnson, male   DOB: 1951-03-04, 64 y.o.   MRN: [id number] Less pain, ambulating. Decrease of drainage. Dc hemovac

## 2015-09-29 NOTE — Progress Notes (Signed)
Physical Therapy Treatment Patient Details Name: IRIE MERKER MRN: [id number] DOB: [birth year] Today's Date: 09/29/2015    History of Present Illness 64 y.o. male s/p bilateral L3-L4 laminectomy and facetectomy, bilateral L3-4 and L4-5 diskectomy, and L3-L5 posterolateral arthrodesis. 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    PT Comments    Pt with increased alertness today compared to yesterday's session.  Pt tends to twist to see things due to visual deficits.  Pt educated on back precautions and using other techniques to maintain safety.  Initiated stair training.   Follow Up Recommendations  Home health PT;Supervision for mobility/OOB     Equipment Recommendations  Rolling walker with 5" wheels    Recommendations for Other Services       Precautions / Restrictions Precautions Precautions: Back;Fall Precaution Comments: reviewed precautions. Needed cueing to remember them all. Required Braces or Orthoses: Spinal Brace Spinal Brace: Lumbar corset;Applied in sitting position Restrictions Weight Bearing Restrictions: No    Mobility  Bed Mobility               General bed mobility comments: Pt up in recliner upon arrival  Transfers Overall transfer level: Needs assistance Equipment used: Rolling walker (2 wheeled) Transfers: Sit to/from Stand Sit to Stand: Min guard         General transfer comment: cues for safe technique.  Pt wanting to twist to be able to see armrests with stand > sit due to visual deficits.  Ambulation/Gait Ambulation/Gait assistance: Min guard Ambulation Distance (Feet): 168 Feet Assistive device: Rolling walker (2 wheeled) Gait Pattern/deviations: Decreased stride length;Trunk flexed Gait velocity: decreased Gait velocity interpretation: Below normal speed for age/gender General Gait Details: cues for posture and step  length   Stairs Stairs: Yes Stairs assistance: Min guard Stair Management: Two rails;Step to pattern Number of Stairs: 2 (x 2 reps) General stair comments: Educated on proper technique and step to pattern  Wheelchair Mobility    Modified Rankin (Stroke Patients Only)       Balance     Sitting balance-Leahy Scale: Good     Standing balance support: Bilateral upper extremity supported Standing balance-Leahy Scale: Poor Standing balance comment: requires UE support                    Cognition Arousal/Alertness: Awake/alert Behavior During Therapy: WFL for tasks assessed/performed Overall Cognitive Status: Within Functional Limits for tasks assessed       Memory: Decreased recall of precautions              Exercises      General Comments General comments (skin integrity, edema, etc.): Pt and wife educated on positioning while following precautions.  Reviewed precaution sheet.  Asknig questions about dressing.  Answered, but told her OT would go into more detail.      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 PT Goal Formulation: With patient Time For Goal Achievement: 10/03/15 Potential to Achieve Goals: Good Progress towards PT goals: Progressing  toward goals    Frequency  Min 5X/week    PT Plan Current plan remains appropriate    Co-evaluation             End of Session Equipment Utilized During Treatment: Back brace Activity Tolerance: Patient tolerated treatment well Patient left: in chair;with call bell/phone within reach;with family/visitor present     Time: [id number] PT Time Calculation (min) (ACUTE ONLY): 30 min  Charges:   $Gait Training: 23-37 mins                    G Codes:      Zarie Kosiba LUBECK 09/29/2015, 11:56 AM

## 2015-09-29 NOTE — Care Management Important Message (Signed)
Important Message  Patient Details  Name: MASSON REMILLARD MRN: [id number] Date of Birth: 1950/11/20   Medicare Important Message Given:  Yes    Barb Merino Malky Rudzinski 09/29/2015, 2:31 PM

## 2015-09-29 NOTE — Anesthesia Postprocedure Evaluation (Signed)
Anesthesia Post Note  Patient: Juan Johnson  Procedure(s) Performed: Procedure(s) (LRB): Lumbar three-four, Lumbar four-five Posterior lumbar interbody fusion (N/A)  Patient location during evaluation: PACU Anesthesia Type: General Level of consciousness: awake and alert Pain management: pain level controlled Vital Signs Assessment: post-procedure vital signs reviewed and stable Respiratory status: spontaneous breathing Cardiovascular status: blood pressure returned to baseline Anesthetic complications: no    Last Vitals:  Filed Vitals:   09/28/15 2159 09/29/15 0159  BP: 104/57 132/54  Pulse: 92 91  Temp: 36.9 C 36.8 C  Resp: 20 18    Last Pain:  Filed Vitals:   09/29/15 0259  PainSc: 0-No pain                 Tiajuana Amass

## 2015-09-30 MED ORDER — OXYCODONE HCL 15 MG PO TABS: 15.0000 mg | ORAL_TABLET | ORAL | Status: DC | PRN

## 2015-09-30 NOTE — Care Management Note (Signed)
Case Management Note  Patient Details  Name: Juan Johnson MRN: [id number] Date of Birth: August 27, 1951  Subjective/Objective:                  Lumbar decompression and fusion  Action/Plan: Cm spoke to patient and daughter-in-law at the bedside. CM explained order for HHPT and patient was in agreement and chose [facility] when offered choice. Patient will be home with his spouse who is able to provide needed support. Patient said that the only dme needed was a RW and chose [facility]. CM called Tiffany with [facility] for referral for [facility] PT and referral accepted. CM called Merry Proud with [facility] DME and RW to be delivered to the bedside. Patient has no further CM needs and no difficulty obtaining medications. Cm confirmed contact information.   Cm remains available should additional needs arise.   Expected Discharge Date:  09/30/15               Expected Discharge Plan:  [place]  In-House Referral:     Discharge planning Services  CM Consult  Post Acute Care Choice:  Durable Medical Equipment, Home Health Choice offered to:  Patient, Adult Children  DME Arranged:  Walker rolling DME Agency:  [place]:  PT [place]:  [place]  Status of Service:  Completed, signed off  Medicare Important Message Given:  Yes Date Medicare IM Given:    Medicare IM give by:    Date Additional Medicare IM Given:    Additional Medicare Important Message give by:     If discussed at [place] of Stay Meetings, dates discussed:    Additional Comments:  Guido Sander, RN 09/30/2015, 12:51 PM

## 2015-09-30 NOTE — Progress Notes (Signed)
Patient ID: Juan Johnson, male   DOB: 05-20-1951, 64 y.o.   MRN: [id number] Doing well no leg pain  Back pain controlled  Discharge home

## 2015-09-30 NOTE — Progress Notes (Signed)
Discharge orders received. Discharge packet reviewed and pt verbalized understanding. Pt given discharge packet and prescription. IVs removed. Pt dressed and belongings packed. Pt taken downstairs by staff via wheelchair.

## 2015-09-30 NOTE — Discharge Instructions (Signed)
No lifting no bending no twisting no driving a riding a car unless he is come back and forth to see Dr. Joya Salm

## 2015-09-30 NOTE — Progress Notes (Signed)
Physical Therapy Treatment Patient Details Name: Juan Johnson MRN: [id number] DOB: 26-Jun-1951 Today's Date: 09/30/2015    History of Present Illness 64 y.o. male s/p bilateral L3-L4 laminectomy and facetectomy, bilateral L3-4 and L4-5 diskectomy, and L3-L5 posterolateral arthrodesis. 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    PT Comments    Patient reports family looking for walker for home at time of evaluation, looking forward to going home.  MIN assist for bed mobility for LE management, otherwise MIN Guard for transfers and mobility.  Slow pace, pain still a factor, but improving overall.  No family present for evaluation, patient reports he will have multiple people to assist at home.  Patient performing well enough to transition home when medically ready.   Follow Up Recommendations  Home health PT;Supervision for mobility/OOB     Equipment Recommendations  Rolling walker with 5" wheels    Recommendations for Other Services       Precautions / Restrictions Precautions Precautions: Back;Fall Precaution Comments: Pt able to recall 3/3 precautions Required Braces or Orthoses: Spinal Brace Spinal Brace: Lumbar corset;Applied in sitting position Restrictions Weight Bearing Restrictions: No    Mobility  Bed Mobility Overal bed mobility: Needs Assistance Bed Mobility: Rolling;Sit to Sidelying Rolling: Min guard Sidelying to sit: Min assist     Sit to sidelying: Min assist General bed mobility comments: Still requires assist for LE management  Transfers Overall transfer level: Needs assistance Equipment used: Rolling walker (2 wheeled) Transfers: Sit to/from Stand Sit to Stand: Min guard         General transfer comment: Cues for technique  Ambulation/Gait Ambulation/Gait assistance: Min guard Ambulation Distance (Feet): 150 Feet Assistive device: Rolling  walker (2 wheeled) Gait Pattern/deviations: Step-through pattern;Decreased stride length Gait velocity: decreased Gait velocity interpretation: Below normal speed for age/gender General Gait Details: cues for posture and step length   Stairs Stairs: Yes Stairs assistance: Min guard Stair Management: Two rails;Step to pattern Number of Stairs: 4 General stair comments: Good recall of step to pattern  Wheelchair Mobility    Modified Rankin (Stroke Patients Only)       Balance     Sitting balance-Leahy Scale: Good Sitting balance - Comments: Requires occassional support while sitting.     Standing balance-Leahy Scale: Fair                      Cognition Arousal/Alertness: Awake/alert Behavior During Therapy: WFL for tasks assessed/performed Overall Cognitive Status: Within Functional Limits for tasks assessed                      Exercises      General Comments        Pertinent Vitals/Pain Pain Assessment: 0-10 Pain Score: 4  Pain Location: back Pain Descriptors / Indicators: Sore Pain Intervention(s): Monitored during session;Repositioned    Home Living                      Prior Function            PT Goals (current goals can now be found in the care plan section) Acute Rehab PT Goals Patient Stated Goal: to go home, with family to assist. PT Goal Formulation: With patient Time For Goal Achievement: 10/03/15 Potential to Achieve Goals: Good Progress towards PT goals: Progressing toward goals    Frequency  Min 5X/week  PT Plan Current plan remains appropriate    Co-evaluation             End of Session Equipment Utilized During Treatment: Back brace Activity Tolerance: Patient tolerated treatment well Patient left: in chair;with call bell/phone within reach;with family/visitor present     Time: 1100-1130 PT Time Calculation (min) (ACUTE ONLY): 30 min  Charges:  $Gait Training: 8-22 mins $Therapeutic  Activity: 8-22 mins                    G Codes:      Inetha Maret L Oct 02, 2015, 12:55 PM

## 2015-09-30 NOTE — Discharge Summary (Signed)
Physician Discharge Summary  Patient ID: Juan Johnson MRN: [id number] DOB/AGE: [birth year] 66 y.o.  Admit date: 09/27/2015 Discharge date: 09/30/2015  Admission Diagnoses: Lumbar stenosis  Discharge Diagnoses: Same Active Problems:   Spondylolisthesis of lumbar region   Discharged Condition: good  Hospital Course: Patient admitted the hospital underwent decompressive laminectomy and fusion postop patient did very well recovered in the floor on the floor was angling and voiding spontaneously and stable for discharge home on postop day 2. She'll be discharged on oxycodone for pain as well as home health physical therapy.  Consults: Significant Diagnostic Studies: Treatments: Lumbar decompression and fusion Discharge Exam: Blood pressure 155/61, pulse 93, temperature 98.5 F (36.9 C), temperature source Oral, resp. rate 20, height 5\' 10"  (1.778 m), weight 98.884 kg (218 lb), SpO2 95 %. Strength out of 5 wound clean dry and intact  Disposition: Home  Discharge Instructions    Face-to-face encounter (required for Medicare/Medicaid patients)    Complete by:  As directed   I Emaad Nanna P certify that this patient is under my care and that I, or a nurse practitioner or physician's assistant working with me, had a face-to-face encounter that meets the physician face-to-face encounter requirements with this patient on 09/30/2015. The encounter with the patient was in whole, or in part for the following medical condition(s) which is the primary reason for home health care (List medical condition): Lumbar stenosis  The encounter with the patient was in whole, or in part, for the following medical condition, which is the primary reason for home health care:  Lumbar stenosis  I certify that, based on my findings, the following services are medically necessary home health services:  Physical therapy  Reason for Medically Necessary Home Health Services:  Therapy- Instruction on Safe use of  Assistive Devices for ADLs  My clinical findings support the need for the above services:  Pain interferes with ambulation/mobility  Further, I certify that my clinical findings support that this patient is homebound due to:  Pain interferes with ambulation/mobility     Home Health    Complete by:  As directed   To provide the following care/treatments:  PT     Walker rolling    Complete by:  As directed             Medication List    TAKE these medications        acetaminophen 500 MG tablet  Commonly known as:  TYLENOL  Take 500 mg by mouth every 4 (four) hours as needed (Takes 2 every 4- 6 hours).     albuterol 108 (90 BASE) MCG/ACT inhaler  Commonly known as:  PROAIR HFA  Inhale 2 puffs into the lungs every 6 (six) hours as needed for wheezing or shortness of breath.     dextromethorphan-guaiFENesin 30-600 MG 12hr tablet  Commonly known as:  MUCINEX DM  Take 1 tablet by mouth 2 (two) times daily.     dipyridamole-aspirin 200-25 MG 12hr capsule  Commonly known as:  AGGRENOX  Take 1 capsule by mouth 2 (two) times daily.     ezetimibe 10 MG tablet  Commonly known as:  ZETIA  Take 10 mg by mouth daily.     Fluticasone-Salmeterol 100-50 MCG/DOSE Aepb  Commonly known as:  ADVAIR  Inhale 1 puff into the lungs 2 (two) times daily.     ketorolac 0.5 % ophthalmic solution  Commonly known as:  ACULAR  Place 1 drop into the left eye 4 (four) times daily.  mometasone-formoterol 100-5 MCG/ACT Aero  Commonly known as:  DULERA  Take 2 puffs first thing in am and then another 2 puffs about 12 hours later.     montelukast 10 MG tablet  Commonly known as:  SINGULAIR  Take 10 mg by mouth daily.     multivitamin with minerals tablet  Take 1 tablet by mouth daily.     niacin 1000 MG CR tablet  Commonly known as:  NIASPAN  Take 1,000 mg by mouth daily.     omeprazole 20 MG capsule  Commonly known as:  PRILOSEC  Take 20 mg by mouth 2 (two) times daily before a meal.      oxyCODONE 15 MG immediate release tablet  Commonly known as:  ROXICODONE  Take 1 tablet (15 mg total) by mouth every 4 (four) hours as needed for severe pain.     valsartan-hydrochlorothiazide 160-12.5 MG tablet  Commonly known as:  DIOVAN HCT  Take 1 tablet by mouth daily.     XALATAN 0.005 % ophthalmic solution  Generic drug:  latanoprost  Place 1 drop into both eyes at bedtime.     ZYRTEC PO  Take 1 tablet by mouth daily.           Follow-up Information    Follow up with Floyce Stakes, MD.   Specialty:  Neurosurgery   Contact information:   [address] [place] [address] [phone]       Signed: Elaina Hoops 09/30/2015, 8:33 AM

## 2015-10-03 ENCOUNTER — Emergency Department (HOSPITAL_COMMUNITY): Payer: Medicare Other

## 2015-10-03 ENCOUNTER — Inpatient Hospital Stay (HOSPITAL_COMMUNITY)
Admission: EM | Admit: 2015-10-03 | Discharge: 2015-10-06 | DRG: 310 | Disposition: A | Discharge status: Home Health | Payer: Medicare Other | Attending: Internal Medicine | Admitting: Internal Medicine

## 2015-10-03 ENCOUNTER — Encounter (HOSPITAL_COMMUNITY): Payer: Self-pay | Admitting: Emergency Medicine

## 2015-10-03 DIAGNOSIS — Z981 Arthrodesis status: Secondary | ICD-10-CM

## 2015-10-03 DIAGNOSIS — I959 Hypotension, unspecified: Secondary | ICD-10-CM | POA: Diagnosis not present

## 2015-10-03 DIAGNOSIS — I10 Essential (primary) hypertension: Secondary | ICD-10-CM | POA: Diagnosis not present

## 2015-10-03 DIAGNOSIS — J449 Chronic obstructive pulmonary disease, unspecified: Secondary | ICD-10-CM | POA: Diagnosis not present

## 2015-10-03 DIAGNOSIS — F419 Anxiety disorder, unspecified: Secondary | ICD-10-CM | POA: Diagnosis present

## 2015-10-03 DIAGNOSIS — I480 Paroxysmal atrial fibrillation: Secondary | ICD-10-CM | POA: Diagnosis present

## 2015-10-03 DIAGNOSIS — G4733 Obstructive sleep apnea (adult) (pediatric): Secondary | ICD-10-CM | POA: Diagnosis present

## 2015-10-03 DIAGNOSIS — Z8673 Personal history of transient ischemic attack (TIA), and cerebral infarction without residual deficits: Secondary | ICD-10-CM | POA: Diagnosis not present

## 2015-10-03 DIAGNOSIS — Z888 Allergy status to other drugs, medicaments and biological substances status: Secondary | ICD-10-CM

## 2015-10-03 DIAGNOSIS — I4892 Unspecified atrial flutter: Secondary | ICD-10-CM | POA: Diagnosis present

## 2015-10-03 DIAGNOSIS — Z6832 Body mass index (BMI) 32.0-32.9, adult: Secondary | ICD-10-CM | POA: Diagnosis not present

## 2015-10-03 DIAGNOSIS — Z79899 Other long term (current) drug therapy: Secondary | ICD-10-CM

## 2015-10-03 DIAGNOSIS — H409 Unspecified glaucoma: Secondary | ICD-10-CM | POA: Diagnosis present

## 2015-10-03 DIAGNOSIS — H547 Unspecified visual loss: Secondary | ICD-10-CM | POA: Diagnosis present

## 2015-10-03 DIAGNOSIS — I4891 Unspecified atrial fibrillation: Secondary | ICD-10-CM | POA: Diagnosis not present

## 2015-10-03 DIAGNOSIS — F329 Major depressive disorder, single episode, unspecified: Secondary | ICD-10-CM | POA: Diagnosis present

## 2015-10-03 DIAGNOSIS — I483 Typical atrial flutter: Secondary | ICD-10-CM | POA: Diagnosis not present

## 2015-10-03 DIAGNOSIS — M4316 Spondylolisthesis, lumbar region: Secondary | ICD-10-CM | POA: Diagnosis present

## 2015-10-03 DIAGNOSIS — D649 Anemia, unspecified: Secondary | ICD-10-CM | POA: Diagnosis present

## 2015-10-03 DIAGNOSIS — Z87891 Personal history of nicotine dependence: Secondary | ICD-10-CM

## 2015-10-03 DIAGNOSIS — I639 Cerebral infarction, unspecified: Secondary | ICD-10-CM | POA: Insufficient documentation

## 2015-10-03 DIAGNOSIS — K219 Gastro-esophageal reflux disease without esophagitis: Secondary | ICD-10-CM | POA: Diagnosis present

## 2015-10-03 DIAGNOSIS — D509 Iron deficiency anemia, unspecified: Secondary | ICD-10-CM | POA: Diagnosis not present

## 2015-10-03 DIAGNOSIS — E669 Obesity, unspecified: Secondary | ICD-10-CM | POA: Diagnosis present

## 2015-10-03 DIAGNOSIS — R000 Tachycardia, unspecified: Secondary | ICD-10-CM | POA: Diagnosis present

## 2015-10-03 DIAGNOSIS — Z7982 Long term (current) use of aspirin: Secondary | ICD-10-CM | POA: Diagnosis not present

## 2015-10-03 HISTORY — DX: Anemia, unspecified: D64.9

## 2015-10-03 LAB — BASIC METABOLIC PANEL
Anion gap: 11 (ref 5–15)
BUN: 19 mg/dL (ref 6–20)
CALCIUM: 8.6 mg/dL — AB (ref 8.9–10.3)
CO2: 25 mmol/L (ref 22–32)
CREATININE: 0.9 mg/dL (ref 0.61–1.24)
Chloride: 101 mmol/L (ref 101–111)
GFR calc non Af Amer: >60 mL/min (ref >60)
GLUCOSE: 134 mg/dL — AB (ref 65–99)
Potassium: 3.6 mmol/L (ref 3.5–5.1)
Sodium: 137 mmol/L (ref 135–145)

## 2015-10-03 LAB — VITAMIN B12: VITAMIN B 12: 427 pg/mL (ref 180–914)

## 2015-10-03 LAB — CBC
HCT: 27.4 % — ABNORMAL LOW (ref 39.0–52.0)
Hemoglobin: 9.2 g/dL — ABNORMAL LOW (ref 13.0–17.0)
MCH: 31.3 pg (ref 26.0–34.0)
MCHC: 33.6 g/dL (ref 30.0–36.0)
MCV: 93.2 fL (ref 78.0–100.0)
PLATELETS: 302 10*3/uL (ref 150–400)
RBC: 2.94 MIL/uL — ABNORMAL LOW (ref 4.22–5.81)
RDW: 12.4 % (ref 11.5–15.5)
WBC: 10.1 10*3/uL (ref 4.0–10.5)

## 2015-10-03 LAB — PROTIME-INR
INR: 1.06 (ref 0.00–1.49)
Prothrombin Time: 14 seconds (ref 11.6–15.2)

## 2015-10-03 LAB — TSH: TSH: 0.651 u[IU]/mL (ref 0.350–4.500)

## 2015-10-03 LAB — TROPONIN I: Troponin I: <0.03 ng/mL (ref <0.031)

## 2015-10-03 LAB — RETICULOCYTES
RBC.: 2.81 MIL/uL — ABNORMAL LOW (ref 4.22–5.81)
RETIC COUNT ABSOLUTE: 64.6 10*3/uL (ref 19.0–186.0)
Retic Ct Pct: 2.3 % (ref 0.4–3.1)

## 2015-10-03 LAB — APTT: APTT: 30 s (ref 24–37)

## 2015-10-03 LAB — I-STAT TROPONIN, ED: TROPONIN I, POC: 0 ng/mL (ref 0.00–0.08)

## 2015-10-03 LAB — MAGNESIUM: Magnesium: 1.9 mg/dL (ref 1.7–2.4)

## 2015-10-03 LAB — FERRITIN: Ferritin: 117 ng/mL (ref 24–336)

## 2015-10-03 LAB — IRON AND TIBC
IRON: 20 ug/dL — AB (ref 45–182)
Saturation Ratios: 8 % — ABNORMAL LOW (ref 17.9–39.5)
TIBC: 238 ug/dL — ABNORMAL LOW (ref 250–450)
UIBC: 218 ug/dL

## 2015-10-03 LAB — MRSA PCR SCREENING: MRSA BY PCR: NEGATIVE

## 2015-10-03 LAB — I-STAT CG4 LACTIC ACID, ED: Lactic Acid, Venous: 1.44 mmol/L (ref 0.5–2.0)

## 2015-10-03 LAB — FOLATE: FOLATE: 23.3 ng/mL (ref >5.9)

## 2015-10-03 MED ORDER — DILTIAZEM LOAD VIA INFUSION
10.0000 mg | Freq: Once | INTRAVENOUS | Status: AC
  Administered 2015-10-03: 10 mg via INTRAVENOUS
  Filled 2015-10-03: qty 10

## 2015-10-03 MED ORDER — ACETAMINOPHEN 325 MG PO TABS: 650.0000 mg | ORAL_TABLET | Freq: Four times a day (QID) | ORAL | Status: DC | PRN

## 2015-10-03 MED ORDER — ALBUTEROL SULFATE (2.5 MG/3ML) 0.083% IN NEBU: 2.5000 mg | INHALATION_SOLUTION | Freq: Four times a day (QID) | RESPIRATORY_TRACT | Status: DC | PRN

## 2015-10-03 MED ORDER — SODIUM CHLORIDE 0.9 % IV SOLN: 250.0000 mL | INTRAVENOUS | Status: DC | PRN

## 2015-10-03 MED ORDER — APIXABAN 5 MG PO TABS
5.0000 mg | ORAL_TABLET | Freq: Two times a day (BID) | ORAL | Status: DC
  Administered 2015-10-03 – 2015-10-06 (×6): 5 mg via ORAL
  Filled 2015-10-03 (×8): qty 1

## 2015-10-03 MED ORDER — ONDANSETRON HCL 4 MG/2ML IJ SOLN: 4.0000 mg | Freq: Four times a day (QID) | INTRAMUSCULAR | Status: DC | PRN

## 2015-10-03 MED ORDER — PANTOPRAZOLE SODIUM 40 MG PO TBEC
40.0000 mg | DELAYED_RELEASE_TABLET | Freq: Every day | ORAL | Status: DC
  Administered 2015-10-04 – 2015-10-06 (×3): 40 mg via ORAL
  Filled 2015-10-03 (×3): qty 1

## 2015-10-03 MED ORDER — MONTELUKAST SODIUM 10 MG PO TABS
10.0000 mg | ORAL_TABLET | Freq: Every day | ORAL | Status: DC
  Administered 2015-10-03 – 2015-10-05 (×3): 10 mg via ORAL
  Filled 2015-10-03 (×3): qty 1

## 2015-10-03 MED ORDER — MOMETASONE FURO-FORMOTEROL FUM 100-5 MCG/ACT IN AERO
2.0000 | INHALATION_SPRAY | Freq: Two times a day (BID) | RESPIRATORY_TRACT | Status: DC
  Administered 2015-10-03 – 2015-10-06 (×6): 2 via RESPIRATORY_TRACT
  Filled 2015-10-03: qty 8.8

## 2015-10-03 MED ORDER — SODIUM CHLORIDE 0.9 % IV BOLUS (SEPSIS)
500.0000 mL | Freq: Once | INTRAVENOUS | Status: AC
Start: 2015-10-03 — End: 2015-10-03
  Administered 2015-10-03: 500 mL via INTRAVENOUS

## 2015-10-03 MED ORDER — HEPARIN BOLUS VIA INFUSION
5000.0000 [IU] | Freq: Once | INTRAVENOUS | Status: AC
  Administered 2015-10-03: 5000 [IU] via INTRAVENOUS
  Filled 2015-10-03: qty 5000

## 2015-10-03 MED ORDER — NIACIN ER (ANTIHYPERLIPIDEMIC) 500 MG PO TBCR
1000.0000 mg | EXTENDED_RELEASE_TABLET | Freq: Every day | ORAL | Status: DC
  Administered 2015-10-04 – 2015-10-06 (×3): 1000 mg via ORAL
  Filled 2015-10-03 (×3): qty 2

## 2015-10-03 MED ORDER — LATANOPROST 0.005 % OP SOLN
1.0000 [drp] | Freq: Every day | OPHTHALMIC | Status: DC
  Administered 2015-10-03 – 2015-10-05 (×3): 1 [drp] via OPHTHALMIC
  Filled 2015-10-03: qty 2.5

## 2015-10-03 MED ORDER — SENNOSIDES-DOCUSATE SODIUM 8.6-50 MG PO TABS: 1.0000 | ORAL_TABLET | Freq: Every evening | ORAL | Status: DC | PRN

## 2015-10-03 MED ORDER — TRAZODONE HCL 50 MG PO TABS: 25.0000 mg | ORAL_TABLET | Freq: Every evening | ORAL | Status: DC | PRN

## 2015-10-03 MED ORDER — HYDROCODONE-ACETAMINOPHEN 5-325 MG PO TABS: 1.0000 | ORAL_TABLET | ORAL | Status: DC | PRN

## 2015-10-03 MED ORDER — ACETAMINOPHEN 650 MG RE SUPP: 650.0000 mg | Freq: Four times a day (QID) | RECTAL | Status: DC | PRN

## 2015-10-03 MED ORDER — SODIUM CHLORIDE 0.9 % IJ SOLN: 3.0000 mL | INTRAMUSCULAR | Status: DC | PRN

## 2015-10-03 MED ORDER — HEPARIN (PORCINE) IN NACL 100-0.45 UNIT/ML-% IJ SOLN
1400.0000 [IU]/h | INTRAMUSCULAR | Status: DC
  Administered 2015-10-03: 1400 [IU]/h via INTRAVENOUS
  Filled 2015-10-03: qty 250

## 2015-10-03 MED ORDER — DM-GUAIFENESIN ER 30-600 MG PO TB12: 1.0000 | ORAL_TABLET | Freq: Two times a day (BID) | ORAL | Status: DC | PRN

## 2015-10-03 MED ORDER — KETOROLAC TROMETHAMINE 0.5 % OP SOLN
1.0000 [drp] | Freq: Four times a day (QID) | OPHTHALMIC | Status: DC
  Administered 2015-10-03 – 2015-10-06 (×9): 1 [drp] via OPHTHALMIC
  Filled 2015-10-03 (×2): qty 3

## 2015-10-03 MED ORDER — OXYCODONE HCL 5 MG PO TABS: 15.0000 mg | ORAL_TABLET | ORAL | Status: DC | PRN

## 2015-10-03 MED ORDER — ONDANSETRON HCL 4 MG PO TABS: 4.0000 mg | ORAL_TABLET | Freq: Four times a day (QID) | ORAL | Status: DC | PRN

## 2015-10-03 MED ORDER — ALUM & MAG HYDROXIDE-SIMETH 200-200-20 MG/5ML PO SUSP: 30.0000 mL | Freq: Four times a day (QID) | ORAL | Status: DC | PRN

## 2015-10-03 MED ORDER — DILTIAZEM HCL 100 MG IV SOLR
5.0000 mg/h | INTRAVENOUS | Status: DC
  Administered 2015-10-03: 5 mg/h via INTRAVENOUS
  Administered 2015-10-04: 7.5 mg/h via INTRAVENOUS
  Filled 2015-10-03 (×2): qty 100

## 2015-10-03 MED ORDER — SODIUM CHLORIDE 0.9 % IJ SOLN
3.0000 mL | Freq: Two times a day (BID) | INTRAMUSCULAR | Status: DC
  Administered 2015-10-04 – 2015-10-06 (×4): 3 mL via INTRAVENOUS

## 2015-10-03 NOTE — Progress Notes (Signed)
ANTICOAGULATION CONSULT NOTE - Initial Consult  Pharmacy Consult for Eliquis Indication: atrial fibrillation  Allergies  Allergen Reactions  . Crestor [Rosuvastatin] Other (See Comments)    Cramps and muscle weakness  . Simvastatin Other (See Comments)    Cramps and muscle weakness    Patient Measurements: Height: 5\' 10"  (177.8 cm) Weight: 218 lb 0.6 oz (98.9 kg) IBW/kg (Calculated) : 73 Heparin Dosing Weight:   Vital Signs: Temp: 97.7 F (36.5 C) (12/20 1358) Temp Source: Oral (12/20 1358) BP: 122/66 mmHg (12/20 1600) Pulse Rate: 89 (12/20 1600)  Labs:  Recent Labs  10/03/15 1407 10/03/15 1447  HGB 9.2*  --   HCT 27.4*  --   PLT 302  --   APTT  --  30  LABPROT  --  14.0  INR  --  1.06  CREATININE 0.90  --     Estimated Creatinine Clearance: 95.2 mL/min (by C-G formula based on Cr of 0.9).   Medical History: Past Medical History  Diagnosis Date  . Hypertension   . Arthritis   . COPD (chronic obstructive pulmonary disease) ([place])   . Pneumonia 04/2015    "bronchial" pneumonia   . Stroke [facility]) ? 05/2014    peripheral vision loss  . Sleep apnea     does use cpap  . Depression   . Anxiety     claustrophobia  . GERD (gastroesophageal reflux disease)   . Enlarged liver     20-25 years ago  . Retinal tear of left eye   . Glaucoma   . 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 Complication of anesthesia     difficult to wake up after neck surgery  . A-fib ([place])   . Anemia     Medications:  See EMR   Assessment: 64 yo male s/p recent back surgery, has a history of AFib but was not on anticoagulation PTA. Presented with RVR, will transition from heparin gtt to Eliquis. Cardiology planning for DCCV after four weeks of anticoagulation.   Goal of Therapy:  Monitor platelets by anticoagulation protocol: Yes    Plan:  -Stop heparin at the same time Eliquis is given  -Eliquis 5 mg po bid -Monitor s/sx bleeding -CBC q72h   Harvel Quale 10/03/2015,5:19 PM

## 2015-10-03 NOTE — Consult Note (Signed)
Cardiologist:  New  Reason for Consult: Afib RVR Referring Physician:   [name] is an 64 y.o. male.  HPI:   The patient is a 64 yo male with a history of A-Fib, HTN, COPD, Stroke, OSA, anxiety, depression, glaucoma, anemia.  He is a former patient of Dr. Tilley(> 10 years ago) who apparently did a heart cath which revealed no CAD.  He had an echocardiogram 07/28/14 revealing and EF of 55-60%, normal wall motion, G1DD, LA was normal in size.  He presents with Atrial flutter.  He gets physical therapy, because of back surgery he had on the 14th.  The therapist identified an elevated HR 150's and recommended he com to the ER.  He reports feeling a little weak and dizzy last night but thought it was from the physical therapy.  He denies nausea, vomiting, fever, palpitations, chest pain, shortness of breath, orthopnea, PND, cough, congestion, abdominal pain, hematochezia, melena, lower extremity edema, claudication.   Past Medical History  Diagnosis Date  . Hypertension   . Arthritis   . COPD (chronic obstructive pulmonary disease) ([place])   . Pneumonia 04/2015    "bronchial" pneumonia   . Stroke [facility]) ? 05/2014    peripheral vision loss  . Sleep apnea     does use cpap  . Depression   . Anxiety     claustrophobia  . GERD (gastroesophageal reflux disease)   . Enlarged liver     20-25 years ago  . Retinal tear of left eye   . Glaucoma   . 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 Complication of anesthesia     difficult to wake up after neck surgery  . A-fib ([place])   . Anemia     Past Surgical History  Procedure Laterality Date  . Shoulder surgery  09/24/2011  . Eye surgery  10/2007  . Neck surgery  06/29/2010  . Tonsillectomy      AT AGE 7  . Leg surgery      AT AGE 57  . Vasectomy    . Colonoscopy    . Cardiac catheterization  2003    Family History  Problem Relation Age of Onset  . Diabetes    . Hypothyroidism    . Hypertension Mother   .  Cancer    . Diabetes type II Brother   . Diabetes type II Brother   . Hyperthyroidism    . Cataracts    . Glaucoma    . Macular degeneration Mother   . Stroke Father   . Emphysema Father     smoked    Social History:  reports that he quit smoking about 17 years ago. His smoking use included Cigarettes. He has a 69 pack-year smoking history. He has never used smokeless tobacco. He reports that he does not drink alcohol or use illicit drugs.  Allergies:  Allergies  Allergen Reactions  . Crestor [Rosuvastatin] Other (See Comments)    Cramps and muscle weakness  . Simvastatin Other (See Comments)    Cramps and muscle weakness    Medications:  Medication Sig  acetaminophen (TYLENOL) 500 MG tablet Take 500 mg by mouth every 4 (four) hours as needed for mild pain or moderate pain (Takes 2 every 4- 6 hours).   albuterol (PROAIR HFA) 108 (90 BASE) MCG/ACT inhaler Inhale 2 puffs into the lungs every 6 (six) hours as needed for wheezing or shortness of breath.  aspirin EC  81 MG tablet Take 81 mg by mouth at bedtime.  Cetirizine HCl (ZYRTEC PO) Take 10 mg by mouth daily.   dextromethorphan-guaiFENesin (MUCINEX DM) 30-600 MG 12hr tablet Take 1 tablet by mouth 2 (two) times daily as needed for cough.   ezetimibe (ZETIA) 10 MG tablet Take 10 mg by mouth daily.  Fluticasone-Salmeterol (ADVAIR) 100-50 MCG/DOSE AEPB Inhale 1 puff into the lungs 2 (two) times daily.  ketorolac (ACULAR) 0.5 % ophthalmic solution Place 1 drop into the left eye 4 (four) times daily.   latanoprost (XALATAN) 0.005 % ophthalmic solution Place 1 drop into both eyes at bedtime.   montelukast (SINGULAIR) 10 MG tablet Take 10 mg by mouth at bedtime.   Multiple Vitamins-Minerals (MULTIVITAMIN WITH MINERALS) tablet Take 1 tablet by mouth daily.  niacin (NIASPAN) 1000 MG CR tablet Take 1,000 mg by mouth daily.  omeprazole (PRILOSEC) 20 MG capsule Take 20 mg by mouth 2 (two) times daily before a meal.   oxyCODONE (ROXICODONE)  15 MG immediate release tablet Take 1 tablet (15 mg total) by mouth every 4 (four) hours as needed for severe pain.  valsartan-hydrochlorothiazide (DIOVAN HCT) 160-12.5 MG per tablet Take 1 tablet by mouth daily.     Results for orders placed or performed during the hospital encounter of 10/03/15 (from the past 48 hour(s))  Basic metabolic panel     Status: Abnormal   Collection Time: 10/03/15  2:07 PM  Result Value Ref Range   Sodium 137 135 - 145 mmol/L   Potassium 3.6 3.5 - 5.1 mmol/L   Chloride 101 101 - 111 mmol/L   CO2 25 22 - 32 mmol/L   Glucose, Bld 134 (H) 65 - 99 mg/dL   BUN 19 6 - 20 mg/dL   Creatinine, Ser 0.90 0.61 - 1.24 mg/dL   Calcium 8.6 (L) 8.9 - 10.3 mg/dL   GFR calc non Af Amer >60 >60 mL/min   GFR calc Af Amer >60 >60 mL/min    Comment: (NOTE) The eGFR has been calculated using the CKD EPI equation. This calculation has not been validated in all clinical situations. eGFR's persistently <60 mL/min signify possible Chronic Kidney Disease.    Anion gap 11 5 - 15  CBC     Status: Abnormal   Collection Time: 10/03/15  2:07 PM  Result Value Ref Range   WBC 10.1 4.0 - 10.5 K/uL   RBC 2.94 (L) 4.22 - 5.81 MIL/uL   Hemoglobin 9.2 (L) 13.0 - 17.0 g/dL   HCT 27.4 (L) 39.0 - 52.0 %   MCV 93.2 78.0 - 100.0 fL   MCH 31.3 26.0 - 34.0 pg   MCHC 33.6 30.0 - 36.0 g/dL   RDW 12.4 11.5 - 15.5 %   Platelets 302 150 - 400 K/uL  I-stat troponin, ED (not at [facility], [facility])     Status: None   Collection Time: 10/03/15  2:24 PM  Result Value Ref Range   Troponin i, poc 0.00 0.00 - 0.08 ng/mL   Comment 3            Comment: Due to the release kinetics of cTnI, a negative result within the first hours of the onset of symptoms does not rule out myocardial infarction with certainty. If myocardial infarction is still suspected, repeat the test at appropriate intervals.   I-Stat CG4 Lactic Acid, ED     Status: None   Collection Time: 10/03/15  2:26 PM  Result Value Ref Range    Lactic Acid, Venous  1.44 0.5 - 2.0 mmol/L  Magnesium     Status: None   Collection Time: 10/03/15  2:47 PM  Result Value Ref Range   Magnesium 1.9 1.7 - 2.4 mg/dL    Dg Chest Portable 1 View  10/03/2015  CLINICAL DATA:  Tachycardia EXAM: PORTABLE CHEST 1 VIEW COMPARISON:  06/15/2015 FINDINGS: The heart size and mediastinal contours are within normal limits. Both lungs are clear. The visualized skeletal structures are unremarkable. IMPRESSION: No active disease. Electronically Signed   By: Franchot Gallo M.D.   On: 10/03/2015 15:04    Review of Systems  Constitutional: Negative for fever and diaphoresis.  HENT: Negative for congestion and sore throat.   Respiratory: Negative for cough and shortness of breath.   Cardiovascular: Negative for chest pain, palpitations, orthopnea, claudication, leg swelling and PND.  Gastrointestinal: Negative for nausea, vomiting, abdominal pain, blood in stool and melena.  Musculoskeletal: Positive for back pain (Just surgery on 14th).  Neurological: Positive for weakness (A little last night). Negative for dizziness.  All other systems reviewed and are negative.  Blood pressure 92/64, pulse 103, temperature 97.7 F (36.5 C), temperature source Oral, resp. rate 19, height 5' 10" (1.778 m), weight 218 lb 0.6 oz (98.9 kg), SpO2 100 %. Physical Exam  Nursing note and vitals reviewed. Constitutional: He is oriented to person, place, and time. He appears well-developed. No distress.  Overweight  HENT:  Head: Normocephalic and atraumatic.  Mouth/Throat: No oropharyngeal exudate.  Eyes: EOM are normal. Pupils are equal, round, and reactive to light. No scleral icterus.  Neck: Normal range of motion. Neck supple.  Cardiovascular: Normal rate, S1 normal and S2 normal.  An irregularly irregular rhythm present.  No murmur heard. Pulses:      Radial pulses are 2+ on the right side, and 2+ on the left side.       Dorsalis pedis pulses are 2+ on the right side,  and 2+ on the left side.  No carotid bruits  Respiratory: Effort normal and breath sounds normal. He has no wheezes. He has no rales.  GI: Bowel sounds are normal. He exhibits distension. There is no tenderness. There is no rebound.  Musculoskeletal: He exhibits no edema.  Lymphadenopathy:    He has no cervical adenopathy.  Neurological: He is alert and oriented to person, place, and time. He has normal reflexes.  Skin: Skin is warm and dry.  Psychiatric: He has a normal mood and affect.    Assessment/Plan: Active Problems:   Essential hypertension   COPD GOLD I   Obese   Spondylolisthesis of lumbar region   New onset Flutter (HCC)   Anemia  64 yo male with a history of A-Fib, HTN, COPD, Stroke, OSA, anxiety, depression, glaucoma, anemia.  Heart cath >59yr ago with no CAD.  He had lumbar fusion on the 14th.  He presents with new onset Aflutter with an unknown date of onset.   Currently rate controlled on IV diltiazem.  CHADSVASC 4.  On IV heparin.  Will need long-term anticoag with Eliquis or Xarelto.  Kidney function is good.  He is stable and asymptomatic.  Check echo.  Last one in 2015 was essentially normal.  I think he can be rate controlled on PO meds(cardizem 1240mdaily) and plan DCCV in 30 days after continuous anticoag.  Was on Aggrenox before surgery. Troponin negative.  Nuclear stress test to rule out ischemic cause in the office.  TSH pending.  Juan Johnson, 3:20 PM  Patient seen and examined and history reviewed. Agree with above findings and plan. 64 yo WM s/p recent lumbar surgery 6 days ago. Noted by PT to have elevated heart rate 150s. Really asymptomatic. Noted to be in atrial flutter with RVR. Rate now normal on IV diltiazem. Denies chest pain, SOB, dizziness, or palpitations. Echo one year ago was OK. History of HTN. [place] vasc score of 4 with prior CVA. Recommend anticoagulation with NOAC- will start Eliquis 5 mg bid. Observe overnight. If patient  does not convert overnight would transition to oral rate control and DC home. Will repeat Echo. Plan DCCV in 4 week after anticoagulation since he is asymptomatic. If Atrial flutter recurs he may be a candidate for ablation.  Carnell Beavers [place], [place] 10/03/2015 4:53 PM

## 2015-10-03 NOTE — Progress Notes (Signed)
ANTICOAGULATION CONSULT NOTE - Initial Consult  Pharmacy Consult for heparin Indication: atrial fibrillation  Allergies  Allergen Reactions  . Crestor [Rosuvastatin] Other (See Comments)    Cramps and muscle weakness  . Simvastatin Other (See Comments)    Cramps and muscle weakness    Patient Measurements: Height: 5\' 10"  (177.8 cm) Weight: 218 lb 0.6 oz (98.9 kg) IBW/kg (Calculated) : 73 Heparin Dosing Weight: 93 kg  Vital Signs: Temp: 97.7 F (36.5 C) (12/20 1358) Temp Source: Oral (12/20 1358) BP: 112/80 mmHg (12/20 1411) Pulse Rate: 150 (12/20 1411)  Labs:  Recent Labs  10/03/15 1407  HGB 9.2*  HCT 27.4*  PLT 302    Estimated Creatinine Clearance: 86.6 mL/min (by C-G formula based on Cr of 0.99).   Medical History: Past Medical History  Diagnosis Date  . Hypertension   . Arthritis   . COPD (chronic obstructive pulmonary disease) ([place])   . Pneumonia 04/2015    "bronchial" pneumonia   . Stroke [facility]) ? 05/2014    peripheral vision loss  . Sleep apnea     does use cpap  . Depression   . Anxiety     claustrophobia  . GERD (gastroesophageal reflux disease)   . Enlarged liver     20-25 years ago  . Retinal tear of left eye   . Glaucoma   . 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 Complication of anesthesia     difficult to wake up after neck surgery   Assessment: 64 yo male presenting with tachycardia and hypotension. Recent back surgery. New onset a fib?  AC: None pta.   Renal: SCr pending  Heme: H&H 9.2/27.4, Plt 302  Goal of Therapy:  Heparin level 0.3-0.7 units/ml Monitor platelets by anticoagulation protocol: Yes   Plan:  Heparin 5000 unit bolus followed by 1400 units/hr HL 2100 Daily HL, CBC Monitor for s/sx of bleeding F/U Long-term AC plans  Levester Fresh, PharmD, BCPS, [facility] Clinical Pharmacist Pager [phone] 10/03/2015 2:30 PM

## 2015-10-03 NOTE — ED Notes (Signed)
Pt here for tachycardia and hypotension per family; pt had recent back sx

## 2015-10-03 NOTE — H&P (Signed)
[facility] History and Physical  SANTHOSH RANFT [id number] DOB: Dec 26, 1950 DOA: 10/03/2015  Referring physician: Alvino Chapel PCP: Curly Rim, MD   Chief Complaint: afib/general weakness  HPI: Juan Johnson is a very pleasant 64 y.o. male with a past medical history that includes hypertension, COPD, stroke last year, GERD, spondylolisthesis status post lumbar fusion discharged 3 days ago resents to the emergency department with the chief complaint of generalized weakness and low blood pressure. Initial evaluation in the emergency department reveals atrial fibrillation with rapid ventricular response with a heart rate of 150 hypotension with a blood pressure of 89/71.  Information is obtained from the patient and the daughter who is at the bedside. He was discharged from the hospital 3 days ago after a lumbar laminectomy with lumbar fusion. Physical 0therapy came to his home the next day and inform patient that his heart rate was around 111 blood pressure systolic 0000000. Yesterday and today physical therapy again to the house reporting steady increase in heart rate and decrease in blood pressure. Patient called Dr. Joya Salm who referred him to the emergency room. Patient denies chest pain palpitation shortness of breath headache dizziness syncope or near-syncope. No fever chills cough abdominal pain nausea vomiting diarrhea. Reports eating and drinking his normal amount.  In the emergency department he is afebrile, hypotensive with a systolic blood pressure of 89 tachycardic with a heart rate of 154. Is not hypoxic  Review of Systems:  10 point review of systems complete and all systems are negative except as indicated in the history of present illness Past Medical History  Diagnosis Date  . Hypertension   . Arthritis   . COPD (chronic obstructive pulmonary disease) ([place])   . Pneumonia 04/2015    "bronchial" pneumonia   . Stroke [facility]) ? 05/2014    peripheral vision loss  . Sleep  apnea     does use cpap  . Depression   . Anxiety     claustrophobia  . GERD (gastroesophageal reflux disease)   . Enlarged liver     20-25 years ago  . Retinal tear of left eye   . Glaucoma   . 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 Complication of anesthesia     difficult to wake up after neck surgery  . A-fib ([place])   . Anemia    Past Surgical History  Procedure Laterality Date  . Shoulder surgery  09/24/2011  . Eye surgery  10/2007  . Neck surgery  06/29/2010  . Tonsillectomy      AT AGE 67  . Leg surgery      AT AGE 75  . Vasectomy    . Colonoscopy    . Cardiac catheterization  2003   Social History:  reports that he quit smoking about 17 years ago. His smoking use included Cigarettes. He has a 69 pack-year smoking history. He has never used smokeless tobacco. He reports that he does not drink alcohol or use illicit drugs. 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 Crestor [Rosuvastatin] Other (See Comments)    Cramps and muscle weakness  . Simvastatin Other (See Comments)    Cramps and muscle weakness    Family History  Problem Relation Age of Onset  . Diabetes    . Hypothyroidism    . Hypertension Mother   . Cancer    .  Diabetes type II Brother   . Diabetes type II Brother   . Hyperthyroidism    . Cataracts    . Glaucoma    . Macular degeneration Mother   . Stroke Father   . Emphysema Father     smoked    Prior to Admission medications   Medication Sig Start Date End Date Taking? Authorizing Provider  acetaminophen (TYLENOL) 500 MG tablet Take 500 mg by mouth every 4 (four) hours as needed for mild pain or moderate pain (Takes 2 every 4- 6 hours).    Yes Historical Provider, MD  albuterol (PROAIR HFA) 108 (90 BASE) MCG/ACT inhaler Inhale 2 puffs into the lungs every 6 (six) hours as needed for wheezing or shortness of breath. 04/11/15  Yes Tanda Rockers, MD  aspirin EC 81 MG tablet Take 81 mg by mouth at bedtime.   Yes Historical Provider, MD  Cetirizine HCl (ZYRTEC PO) Take 10 mg by mouth daily.    Yes Historical Provider, MD  dextromethorphan-guaiFENesin (MUCINEX DM) 30-600 MG 12hr tablet Take 1 tablet by mouth 2 (two) times daily as needed for cough.    Yes Historical Provider, MD  ezetimibe (ZETIA) 10 MG tablet Take 10 mg by mouth daily.   Yes Historical Provider, MD  Fluticasone-Salmeterol (ADVAIR) 100-50 MCG/DOSE AEPB Inhale 1 puff into the lungs 2 (two) times daily.   Yes Historical Provider, MD  ketorolac (ACULAR) 0.5 % ophthalmic solution Place 1 drop into the left eye 4 (four) times daily.  08/22/14  Yes Historical Provider, MD  latanoprost (XALATAN) 0.005 % ophthalmic solution Place 1 drop into both eyes at bedtime.    Yes Historical Provider, MD  montelukast (SINGULAIR) 10 MG tablet Take 10 mg by mouth at bedtime.  03/09/15  Yes Historical Provider, MD  Multiple Vitamins-Minerals (MULTIVITAMIN WITH MINERALS) tablet Take 1 tablet by mouth daily.   Yes Historical Provider, MD  niacin (NIASPAN) 1000 MG CR tablet Take 1,000 mg by mouth daily. 03/09/15  Yes Historical Provider, MD  omeprazole (PRILOSEC) 20 MG capsule Take 20 mg by mouth 2 (two) times daily before a meal.  03/09/15  Yes Historical Provider, MD  oxyCODONE (ROXICODONE) 15 MG immediate release tablet Take 1 tablet (15 mg total) by mouth every 4 (four) hours as needed for severe pain. 09/30/15  Yes Kary Kos, MD  valsartan-hydrochlorothiazide (DIOVAN HCT) 160-12.5 MG per tablet Take 1 tablet by mouth daily. 08/22/14  Yes Tanda Rockers, MD   Physical Exam: Filed Vitals:   10/03/15 1416 10/03/15 1445 10/03/15 1500 10/03/15 1515  BP:  92/64 105/70 110/62  Pulse:  103 79 80  Temp:      TempSrc:      Resp:  19 17 16   Height: 5\' 10"  (1.778 m)     Weight: 98.9 kg (218 lb 0.6 oz)     SpO2:  100% 94% 100%    Wt Readings from Last 3 Encounters:  10/03/15 98.9 kg (218 lb 0.6  oz)  09/29/15 98.884 kg (218 lb)  09/25/15 101.334 kg (223 lb 6.4 oz)    General:  Appears calm and comfortable Eyes: PERRL, normal lids, irises & conjunctiva ENT: grossly normal hearing, lips & tongue Neck: no LAD, masses or thyromegaly Cardiovascular: Irregularly irregular, no m/r/g. No LE edema.  Respiratory: CTA bilaterally, no w/r/r. Normal respiratory effort. Abdomen: soft, ntnd obese positive bowel sounds no guarding or rebounding Skin: no rash or induration seen on limited exam Musculoskeletal: grossly normal tone BUE/BLE Psychiatric: grossly  normal mood and affect, speech fluent and appropriate Neurologic: grossly non-focal. speech clear facial symmetry moves all extremities spontaneously           Labs on Admission:  Basic Metabolic Panel:  Recent Labs Lab 09/27/15 1615 09/27/15 1620 10/03/15 1407 10/03/15 1447  NA 136 137 137  --   K 4.3 4.2 3.6  --   CL  --   --  101  --   CO2  --   --  25  --   GLUCOSE  --   --  134*  --   BUN  --   --  19  --   CREATININE  --   --  0.90  --   CALCIUM  --   --  8.6*  --   MG  --   --   --  1.9   Liver Function Tests: No results for input(s): AST, ALT, ALKPHOS, BILITOT, PROT, ALBUMIN in the last 168 hours. No results for input(s): LIPASE, AMYLASE in the last 168 hours. No results for input(s): AMMONIA in the last 168 hours. CBC:  Recent Labs Lab 09/27/15 1615 09/27/15 1620 09/28/15 0549 10/03/15 1407  WBC  --   --  11.0* 10.1  HGB 9.9* 10.2* 10.1* 9.2*  HCT 29.0* 30.0* 31.1* 27.4*  MCV  --   --  93.4 93.2  PLT  --   --  176 302   Cardiac Enzymes: No results for input(s): CKTOTAL, CKMB, CKMBINDEX, TROPONINI in the last 168 hours.  BNP (last 3 results) No results for input(s): BNP in the last 8760 hours.  ProBNP (last 3 results) No results for input(s): PROBNP in the last 8760 hours.  CBG: No results for input(s): GLUCAP in the last 168 hours.  Radiological Exams on Admission: Dg Chest Portable 1  View  10/03/2015  CLINICAL DATA:  Tachycardia EXAM: PORTABLE CHEST 1 VIEW COMPARISON:  06/15/2015 FINDINGS: The heart size and mediastinal contours are within normal limits. Both lungs are clear. The visualized skeletal structures are unremarkable. IMPRESSION: No active disease. Electronically Signed   By: Franchot Gallo M.D.   On: 10/03/2015 15:04    EKG: Independently reviewed. Atrial flutter with variable A-V block Marked ST abnormality, possible inferior subendocardial injury   Assessment/Plan Principal Problem:   New onset a-fib (HCC) Active Problems:   Essential hypertension   COPD GOLD I   Obese   Spondylolisthesis of lumbar region   Anemia  #1. New onset A. Fib. Per EKG. Chest x-ray with no active disease. Febrile nontoxic. Await TSH. May be related to recent anesthesia and/or mild blood loss postop. This likely started 3 days ago. He's remained asymptomatic -Chads score 5 -Will admit to step down -Continue Cardizem drip that was initiated in the emergency department -Continue heparin drip that was initiated in the emergency department -Cycle troponins and repeat EKG -We'll defer management of Cardizem drip and heparin to cardiology -Dr. Alvino Chapel in the emergency department reports speaking with Dr. Joya Salm with neurosurgery who said okay to anticoagulate  #2. Hypertension. Home medications include Diovan HCT. -Blood pressure very soft on admission. Related to above. - We'll hold Diovan -Monitor closely -Await cardiology input  3. Anemia. Acute on chronic. Current hemoglobin less than baseline. Likely related to recent blood loss and/or surgery. No signs of active bleeding. -We'll obtain an anemia panel -Monitor  #4. Spondylolisthesis of lumbar status post lumbar laminectomy with fusion 4 days ago. He is afebrile with a normal lactic acid and nontoxic  appearing -Pain controlled with Tylenol -We will request physical therapy to avoid interruption with his postop  therapy -Follow-up with Dr. Joya Salm -Dr. Joya Salm informed of admission  #5. COPD. Mild. Currently stable at baseline -Continue his home inhalers  #6. History of stroke. -Residual peripheral vision loss -Patient was on Aggrenox preop -At discharge it was decided he would go home on aspirin given some blood loss -See #1    cardmaster per Dr Alvino Chapel.   Code Status: full DVT Prophylaxis: Family Communication: daughter at bedside Disposition Plan: home when ready  Time spent: 73 minutes  [facility]

## 2015-10-03 NOTE — ED Notes (Signed)
Dinner tray ordered for pt.  Family remains at bedside.

## 2015-10-03 NOTE — ED Provider Notes (Signed)
CSN: [id number]     Arrival date & time 10/03/15  1350 History   First MD Initiated Contact with Patient 10/03/15 1402     Chief Complaint  Patient presents with  . Tachycardia      The history is provided by the patient.   patient presents with a fast heart rate. He has had it for the last 3 days. Reportedly was 120 and today is 150. Around a week ago he had spinal surgery. Complicated by some bleeding. He had been on Aggrenox, but had been off it for the surgery. Note chest pain. Has had an occasional cough. No real trouble breathing. No swelling in his legs. Previous stroke but no known cause.  Past Medical History  Diagnosis Date  . Hypertension   . Arthritis   . COPD (chronic obstructive pulmonary disease) ([place])   . Pneumonia 04/2015    "bronchial" pneumonia   . Stroke [facility]) ? 05/2014    peripheral vision loss  . Sleep apnea     does use cpap  . Depression   . Anxiety     claustrophobia  . GERD (gastroesophageal reflux disease)   . Enlarged liver     20-25 years ago  . Retinal tear of left eye   . Glaucoma   . 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 Complication of anesthesia     difficult to wake up after neck surgery  . A-fib ([place])   . Anemia    Past Surgical History  Procedure Laterality Date  . Shoulder surgery  09/24/2011  . Eye surgery  10/2007  . Neck surgery  06/29/2010  . Tonsillectomy      AT AGE 36  . Leg surgery      AT AGE 19  . Vasectomy    . Colonoscopy    . Cardiac catheterization  2003   Family History  Problem Relation Age of Onset  . Diabetes    . Hypothyroidism    . Hypertension Mother   . Cancer    . Diabetes type II Brother   . Diabetes type II Brother   . Hyperthyroidism    . Cataracts    . Glaucoma    . Macular degeneration Mother   . Stroke Father   . Emphysema Father     smoked   Social History  Substance Use Topics  . Smoking status: Former Smoker -- 3.00 packs/day for 23 years    Types: Cigarettes     Quit date: 10/12/1997  . Smokeless tobacco: Never Used  . Alcohol Use: No     Comment: OCC    Review of Systems  Constitutional: Negative for activity change and appetite change.  Eyes: Negative for pain.  Respiratory: Positive for cough. Negative for chest tightness and shortness of breath.   Cardiovascular: Positive for palpitations. Negative for chest pain and leg swelling.  Gastrointestinal: Negative for nausea, vomiting, abdominal pain and diarrhea.  Genitourinary: Negative for flank pain.  Musculoskeletal: Positive for back pain (back pain improved since surgery). Negative for neck stiffness.  Skin: Negative for rash.  Neurological: Negative for weakness, numbness and headaches.  Psychiatric/Behavioral: Negative for behavioral problems.      Allergies  Crestor and Simvastatin  Home Medications   Prior to Admission medications   Medication Sig Start Date End Date Taking? Authorizing Provider  acetaminophen (TYLENOL) 500 MG tablet Take 500 mg by mouth every 4 (four) hours as needed for mild  pain or moderate pain (Takes 2 every 4- 6 hours).    Yes Historical Provider, MD  albuterol (PROAIR HFA) 108 (90 BASE) MCG/ACT inhaler Inhale 2 puffs into the lungs every 6 (six) hours as needed for wheezing or shortness of breath. 04/11/15  Yes Tanda Rockers, MD  aspirin EC 81 MG tablet Take 81 mg by mouth at bedtime.   Yes Historical Provider, MD  Cetirizine HCl (ZYRTEC PO) Take 10 mg by mouth daily.    Yes Historical Provider, MD  dextromethorphan-guaiFENesin (MUCINEX DM) 30-600 MG 12hr tablet Take 1 tablet by mouth 2 (two) times daily as needed for cough.    Yes Historical Provider, MD  ezetimibe (ZETIA) 10 MG tablet Take 10 mg by mouth daily.   Yes Historical Provider, MD  Fluticasone-Salmeterol (ADVAIR) 100-50 MCG/DOSE AEPB Inhale 1 puff into the lungs 2 (two) times daily.   Yes Historical Provider, MD  ketorolac (ACULAR) 0.5 % ophthalmic solution Place 1 drop into the left eye 4  (four) times daily.  08/22/14  Yes Historical Provider, MD  latanoprost (XALATAN) 0.005 % ophthalmic solution Place 1 drop into both eyes at bedtime.    Yes Historical Provider, MD  montelukast (SINGULAIR) 10 MG tablet Take 10 mg by mouth at bedtime.  03/09/15  Yes Historical Provider, MD  Multiple Vitamins-Minerals (MULTIVITAMIN WITH MINERALS) tablet Take 1 tablet by mouth daily.   Yes Historical Provider, MD  niacin (NIASPAN) 1000 MG CR tablet Take 1,000 mg by mouth daily. 03/09/15  Yes Historical Provider, MD  omeprazole (PRILOSEC) 20 MG capsule Take 20 mg by mouth 2 (two) times daily before a meal.  03/09/15  Yes Historical Provider, MD  oxyCODONE (ROXICODONE) 15 MG immediate release tablet Take 1 tablet (15 mg total) by mouth every 4 (four) hours as needed for severe pain. 09/30/15  Yes Kary Kos, MD  valsartan-hydrochlorothiazide (DIOVAN HCT) 160-12.5 MG per tablet Take 1 tablet by mouth daily. 08/22/14  Yes Tanda Rockers, MD   BP 110/62 mmHg  Pulse 80  Temp(Src) 97.7 F (36.5 C) (Oral)  Resp 16  Ht 5\' 10"  (1.778 m)  Wt 218 lb 0.6 oz (98.9 kg)  BMI 31.28 kg/m2  SpO2 100% Physical Exam  Constitutional: He appears well-developed.  Neck: Neck supple.  Cardiovascular:  Irregular tachycardia  Pulmonary/Chest: Effort normal.  Abdominal: Soft. There is no tenderness.  Musculoskeletal: He exhibits no edema.  Neurological: He is alert.  Skin:  Lower back dressing clean dry and intact.    ED Course  Procedures (including critical care time) Labs Review Labs Reviewed  BASIC METABOLIC PANEL - Abnormal; Notable for the following:    Glucose, Bld 134 (*)    Calcium 8.6 (*)    All other components within normal limits  CBC - Abnormal; Notable for the following:    RBC 2.94 (*)    Hemoglobin 9.2 (*)    HCT 27.4 (*)    All other components within normal limits  MAGNESIUM  PROTIME-INR  APTT  TSH  HEPARIN LEVEL (UNFRACTIONATED)  I-STAT TROPOININ, ED  I-STAT CG4 LACTIC ACID, ED     Imaging Review Dg Chest Portable 1 View  10/03/2015  CLINICAL DATA:  Tachycardia EXAM: PORTABLE CHEST 1 VIEW COMPARISON:  06/15/2015 FINDINGS: The heart size and mediastinal contours are within normal limits. Both lungs are clear. The visualized skeletal structures are unremarkable. IMPRESSION: No active disease. Electronically Signed   By: Franchot Gallo M.D.   On: 10/03/2015 15:04   I have personally  reviewed and evaluated these images and lab results as part of my medical decision-making.   EKG Interpretation   Date/Time:  Tuesday October 03 2015 13:53:23 EST Ventricular Rate:  147 PR Interval:    QRS Duration: 74 QT Interval:  240 QTC Calculation: 375 R Axis:   15 Text Interpretation:  Atrial flutter with variable A-V block Marked ST  abnormality, possible inferior subendocardial injury Abnormal ECG  Confirmed by Keevin Panebianco  MD, Ovid Curd [phone]) on 10/03/2015 2:03:18 PM      MDM   Final diagnoses:  New onset a-fib ([place])  Atrial fibrillation with rapid ventricular response ([place])    Patient with new onset A. fib. Rapid ventricular rate with mildly decreased blood pressure. Will start on heparin and Cardizem drip. No chest pain. No unilateral leg swelling or chest pain. Not hypoxic. Doubt pulmonary embolism but has been considered with recent surgery. Discussed with Dr. Joya Salm, who states the patient is okay for anticoagulation from a neurosurgical standpoint. He will also see him in the hospital. Will admit to internal medicine with cardiology consult. Anemia is stable.  CRITICAL CARE Performed by: Mackie Pai Total critical care time: 30 minutes Critical care time was exclusive of separately billable procedures and treating other patients. Critical care was necessary to treat or prevent imminent or life-threatening deterioration. Critical care was time spent personally by me on the following activities: development of treatment plan with patient and/or surrogate as  well as nursing, discussions with consultants, evaluation of patient's response to treatment, examination of patient, obtaining history from patient or surrogate, ordering and performing treatments and interventions, ordering and review of laboratory studies, ordering and review of radiographic studies, pulse oximetry and re-evaluation of patient's condition.    Davonna Belling, MD 10/03/15 [phone]

## 2015-10-04 ENCOUNTER — Encounter (HOSPITAL_COMMUNITY): Payer: Self-pay | Admitting: General Practice

## 2015-10-04 DIAGNOSIS — I4892 Unspecified atrial flutter: Secondary | ICD-10-CM | POA: Diagnosis present

## 2015-10-04 DIAGNOSIS — I4891 Unspecified atrial fibrillation: Secondary | ICD-10-CM | POA: Insufficient documentation

## 2015-10-04 DIAGNOSIS — I480 Paroxysmal atrial fibrillation: Secondary | ICD-10-CM

## 2015-10-04 LAB — BASIC METABOLIC PANEL
ANION GAP: 8 (ref 5–15)
BUN: 11 mg/dL (ref 6–20)
CO2: 27 mmol/L (ref 22–32)
Calcium: 8.7 mg/dL — ABNORMAL LOW (ref 8.9–10.3)
Chloride: 101 mmol/L (ref 101–111)
Creatinine, Ser: 0.86 mg/dL (ref 0.61–1.24)
GLUCOSE: 108 mg/dL — AB (ref 65–99)
POTASSIUM: 4 mmol/L (ref 3.5–5.1)
Sodium: 136 mmol/L (ref 135–145)

## 2015-10-04 LAB — CBC
HEMATOCRIT: 25.8 % — AB (ref 39.0–52.0)
HEMOGLOBIN: 8.8 g/dL — AB (ref 13.0–17.0)
MCH: 31.5 pg (ref 26.0–34.0)
MCHC: 34.1 g/dL (ref 30.0–36.0)
MCV: 92.5 fL (ref 78.0–100.0)
Platelets: 336 10*3/uL (ref 150–400)
RBC: 2.79 MIL/uL — ABNORMAL LOW (ref 4.22–5.81)
RDW: 12.4 % (ref 11.5–15.5)
WBC: 9.1 10*3/uL (ref 4.0–10.5)

## 2015-10-04 LAB — TROPONIN I

## 2015-10-04 MED ORDER — FERROUS GLUCONATE 324 (38 FE) MG PO TABS
324.0000 mg | ORAL_TABLET | Freq: Two times a day (BID) | ORAL | Status: DC
  Administered 2015-10-04 – 2015-10-05 (×3): 324 mg via ORAL
  Filled 2015-10-04 (×5): qty 1

## 2015-10-04 MED ORDER — DILTIAZEM HCL 30 MG PO TABS
90.0000 mg | ORAL_TABLET | Freq: Three times a day (TID) | ORAL | Status: DC
  Administered 2015-10-04 – 2015-10-05 (×3): 90 mg via ORAL
  Filled 2015-10-04 (×3): qty 1

## 2015-10-04 MED ORDER — METOPROLOL TARTRATE 25 MG PO TABS
25.0000 mg | ORAL_TABLET | Freq: Two times a day (BID) | ORAL | Status: DC
  Administered 2015-10-04 – 2015-10-06 (×5): 25 mg via ORAL
  Filled 2015-10-04 (×5): qty 1

## 2015-10-04 NOTE — Evaluation (Addendum)
Physical Therapy Evaluation Patient Details Name: Juan Johnson MRN: [id number] DOB: 21-May-1951 Today's Date: 10/04/2015   History of Present Illness  Patient is a 64 y/o male with recent back surgery 12/16 presents with A-fib with RVR. Back in sinus rhythm today. PMH includes HTN, arthritis, CVA, causing peripheral vision loss, anxiety, depression, glaucoma and COPD.  Clinical Impression  Patient presents with functional limitations due to deficits listed in PT problem list (see below). Pt getting HHPT since back surgery and has support from wife at home. Ambulating Min A with RW due to questionable safety awareness. Reviewed back precautions and requires cues to maintain during mobility. HR stable during ambulation and back in NSR. Encouraged sitting in chair as much as tolerated. Will follow acutely to maximize independence and mobility prior to return home.    Follow Up Recommendations Home health PT;Supervision for mobility/OOB    Equipment Recommendations  None recommended by PT    Recommendations for Other Services       Precautions / Restrictions Precautions Precautions: Back Precaution Booklet Issued: No Precaution Comments: Able to recall 2/3 precautions.  Required Braces or Orthoses: Spinal Brace Spinal Brace: Lumbar corset;Applied in sitting position Restrictions Weight Bearing Restrictions: No      Mobility  Bed Mobility Overal bed mobility: Needs Assistance Bed Mobility: Rolling;Sidelying to Sit Rolling: Min guard Sidelying to sit: Min guard       General bed mobility comments: HOB flat, use of rail for support. Good demo of log roll technique.  Transfers Overall transfer level: Needs assistance Equipment used: Rolling walker (2 wheeled) Transfers: Sit to/from Stand Sit to Stand: Min guard         General transfer comment: Min guard for safety. LOB initially upon standing posteriorly but able to regain balance. Transferred to chair post ambulation  bout.  Ambulation/Gait Ambulation/Gait assistance: Min assist Ambulation Distance (Feet): 200 Feet Assistive device: Rolling walker (2 wheeled) Gait Pattern/deviations: Step-through pattern;Decreased stride length Gait velocity: decreased Gait velocity interpretation: Below normal speed for age/gender General Gait Details: cues for posture and step length. Cues for RW safety/management as pt running into things with RW at times. Impaired safety awareness.  Stairs            Wheelchair Mobility    Modified Rankin (Stroke Patients Only)       Balance Overall balance assessment: Needs assistance Sitting-balance support: Feet supported;No upper extremity supported Sitting balance-Leahy Scale: Good     Standing balance support: During functional activity Standing balance-Leahy Scale: Fair Standing balance comment: Takes 1 hand off RW at times but unsteady.                             Pertinent Vitals/Pain Pain Assessment: No/denies pain    Home Living Family/patient expects to be discharged to:: Private residence Living Arrangements: Spouse/significant other Available Help at Discharge: Family;Available 24 hours/day Type of Home: House Home Access: Stairs to enter Entrance Stairs-Rails: None Entrance Stairs-Number of Steps: 1 Home Layout: One level Home Equipment: Cane - single point;Hand held shower head;Grab bars - tub/shower;Walker - 2 wheels      Prior Function Level of Independence: Independent with assistive device(s)         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; uses RW since surgery. Getting HHPT     Hand Dominance   Dominant Hand: Right    Extremity/Trunk Assessment   Upper Extremity Assessment: Defer to OT evaluation  Lower Extremity Assessment: Generalized weakness         Communication   Communication: No difficulties  Cognition Arousal/Alertness: Awake/alert Behavior During Therapy: WFL for tasks  assessed/performed Overall Cognitive Status: Within Functional Limits for tasks assessed (except questionable safety awareness.)       Memory: Decreased recall of precautions              General Comments      Exercises        Assessment/Plan    PT Assessment Patient needs continued PT services  PT Diagnosis Difficulty walking   PT Problem List Decreased strength;Decreased balance;Decreased mobility;Decreased knowledge of precautions;Decreased safety awareness;Decreased activity tolerance  PT Treatment Interventions Balance training;Gait training;Functional mobility training;Therapeutic activities;Therapeutic exercise;Patient/family education;Stair training   PT Goals (Current goals can be found in the Care Plan section) Acute Rehab PT Goals Patient Stated Goal: to go home  PT Goal Formulation: With patient Time For Goal Achievement: 10/18/15 Potential to Achieve Goals: Good    Frequency Min 3X/week   Barriers to discharge        Co-evaluation               End of Session Equipment Utilized During Treatment: Back brace;Gait belt Activity Tolerance: Patient tolerated treatment well Patient left: in chair;with call bell/phone within reach;with family/visitor present Nurse Communication: Mobility status         Time: 1405-1440 PT Time Calculation (min) (ACUTE ONLY): 35 min   Charges:   PT Evaluation $Initial PT Evaluation Tier I: 1 Procedure PT Treatments $Gait Training: 8-22 mins   PT G Codes:        Madeleine Fenn A Teddie Curd 10/04/2015, 3:12 PM Wray Kearns, [place], DPT [phone]

## 2015-10-04 NOTE — Progress Notes (Signed)
At 1112 pt given po cardizem and metoprolol per order, cardizem drip remained on at 10mg /hr until po dose has time to be effective (pharmacy states 2-4hr before dose effective).   At 1153 pt converted to SR rate 70's At 1225 drip stopped, remains in SR, rate in 60's.

## 2015-10-04 NOTE — Progress Notes (Signed)
[facility]  Patient Status: Active (receiving services up to time of hospitalization)  [facility] is providing the following services: PT and OT  If patient discharges after hours, please call [phone].   Juan Johnson 10/04/2015, 10:02 AM

## 2015-10-04 NOTE — Progress Notes (Signed)
[facility]                                                                              Patient Demographics  Juan Johnson, is a 64 y.o. male, DOB - Mar 06, 1951, [id number]  Admit date - 10/03/2015   Admitting Physician Waldemar Dickens, MD  Outpatient Primary MD for the patient is Curly Rim, MD  LOS - 1   Chief Complaint  Patient presents with  . Tachycardia       Brief HPI   Juan Johnson is a very pleasant 64 y.o. male with a past medical history that includes hypertension, COPD, stroke last year, GERD, spondylolisthesis status post lumbar fusion discharged on 12/17 from neurosurgery service, presented with generalized weakness and hypotension. ED workup showed atrial fibrillation with RVR with heart rate of 150, BP 89/71. He denied any chest pressure, palpitations, dizziness or lightheadedness. In the emergency department he is afebrile, hypotensive with a systolic blood pressure of 89 tachycardic with a heart rate of 154. No hypoxia. Patient was admitted to stepdown unit   Assessment & Plan    Principal Problem: New onset A. Fib with RVR - Patient was placed on IV Cardizem drip., Heart rate still elevated with activity. - Cardiology was consulted, recommended starting beta blocker, Cardizem oral, wean Cardizem drip.  - CHADSVASC 4, patient was placed on IV heparin drip, cardiology recommended starting anticoagulation with eliquis. -Dr. Alvino Chapel, EDP spoke with Dr. Joya Salm with neurosurgery who said okay to anticoagulate - Follow 2-D echo   Hypertension. Home medications include Diovan HCT. - DC'd Diovan/HCTZ - Continue Cardizem, metoprolol  Anemia. Acute on chronic. Current hemoglobin less than baseline. Likely related to recent blood loss and/or surgery. No signs of active bleeding. 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 Joya Salm  . COPD. Mild. Currently stable at baseline -Continue his home inhalers  . History of stroke.-Residual peripheral vision loss -Patient was on Aggrenox preop, will continue eliquis now  Code Status: full code  Family Communication: Discussed in detail with the patient, all imaging results, lab results explained to the patient and son at the bed side   Disposition Plan:    Time Spent in minutes  25 minutes  Procedures  echo  Consults   cardiology  DVT Prophylaxis  apixaban   Medications  Scheduled Meds: . apixaban  5 mg Oral BID  . diltiazem  90 mg Oral 3 times per day  . ketorolac  1 drop Left Eye QID  . latanoprost  1 drop Both Eyes QHS  . metoprolol tartrate  25 mg Oral BID  . mometasone-formoterol  2 puff Inhalation BID  . montelukast  10 mg Oral QHS  . niacin  1,000 mg Oral Daily  . pantoprazole  40 mg Oral Daily  . sodium chloride  3 mL Intravenous Q12H   Continuous Infusions:  PRN Meds:.sodium chloride, acetaminophen **OR** acetaminophen, albuterol, alum & mag hydroxide-simeth, dextromethorphan-guaiFENesin, HYDROcodone-acetaminophen, ondansetron **OR** ondansetron (ZOFRAN) IV, oxyCODONE, senna-docusate, sodium chloride, traZODone  Antibiotics   Anti-infectives    None        Subjective:   Juan Johnson was seen and examined today.  Patient denies dizziness, chest pain, shortness of breath, abdominal pain, N/V/D/C, new weakness, numbess, tingling. No acute events overnight. Still on Cardizem drip at the time of my encounter    Objective:   Blood pressure 121/78, pulse 88, temperature 97.4 F (36.3 C), temperature source Oral, resp. rate 18, height 5\' 9"  (1.753 m), weight 99.111 kg (218 lb 8 oz), SpO2 97 %.  Wt Readings from Last 3 Encounters:  10/04/15 99.111 kg (218 lb 8 oz)  09/29/15 98.884 kg (218 lb)  09/25/15 101.334 kg (223 lb 6.4 oz)     Intake/Output Summary (Last 24 hours) at 10/04/15  1102 Last data filed at 10/04/15 0923  Gross per 24 hour  Intake 1090.33 ml  Output   1080 ml  Net  10.33 ml    Exam  General: Alert and oriented x 3, NAD  HEENT:  PERRLA, EOMI, Anicteric Sclera, mucous membranes moist.   Neck: Supple, no JVD, no masses  CVS: S1 S2, ireg ireg  Respiratory: Clear to auscultation bilaterally, no wheezing, rales or rhonchi  Abdomen: Soft, nontender, nondistended, + bowel sounds  Ext: no cyanosis clubbing or edema  Neuro: AAOx3, Cr N's II- XII. Strength 5/5 upper and lower extremities bilaterally  Skin: No rashes  Psych: Normal affect and demeanor, alert and oriented x3    Data Review   Micro Results Recent Results (from the past 240 hour(s))  Surgical pcr screen     Status: None   Collection Time: 09/25/15 10:15 AM  Result Value Ref Range Status   MRSA, PCR NEGATIVE NEGATIVE Final   Staphylococcus aureus NEGATIVE NEGATIVE Final    Comment:        The Xpert SA Assay (FDA approved for NASAL specimens in patients over 63 years of age), is one component of a comprehensive surveillance program.  Test performance has been validated by [facility] for patients greater than or equal to 23 year old. It is not intended to diagnose infection nor to guide or monitor treatment.   MRSA PCR Screening     Status: None   Collection Time: 10/03/15  6:11 PM  Result Value Ref Range Status   MRSA by PCR NEGATIVE NEGATIVE Final    Comment:        The GeneXpert MRSA Assay (FDA approved for NASAL specimens only), is one component of a comprehensive MRSA colonization surveillance program. It is not intended to diagnose MRSA infection nor to guide or monitor treatment for MRSA infections.     Radiology Reports Dg Lumbar Spine 2-3 Views  09/27/2015  CLINICAL DATA:  L3-L5 PLIF, intervertebral disc degeneration EXAM: DG C-ARM 61-120 MIN; LUMBAR SPINE - 2-3 VIEW COMPARISON:  CT lumbar spine 09/06/2015, lumbar radiographs 07/17/2015 FLUOROSCOPY  TIME:  0 minutes 22 seconds Images obtained:  2 FINDINGS: Five non-rib-bearing lumbar segments on prior radiographs. Current images demonstrate placement of BILATERAL pedicle screws at L3, L4 and L5 with intervening disc prostheses at L3-L4 and L4-L5 disc spaces. Bones appear demineralized. No fracture or bone destruction. Atherosclerotic calcifications aorta. Curvilinear arm metallic foreign body projects over L4 vertebral body LEFT of midline on the PA view, uncertain etiology, not seen on lateral view. IMPRESSION: Images obtained during posterior fusion L3-L5. Curvilinear foreign body question metallic or wire fragment projecting over L4 vertebral body on PA view, of uncertain etiology and position. Electronically Signed  By: Lavonia Dana M.D.   On: 09/27/2015 16:43   Dg Lumbar Spine 2-3 Views  09/27/2015  CLINICAL DATA:  L3-L5 posterior fusion.  Localization. EXAM: LUMBAR SPINE - 2-3 VIEW COMPARISON:  Myelogram 09/06/2015 FINDINGS: First lateral intraoperative image demonstrates posterior surgical instruments along the L2 and L3 spinous processes. Second lateral intraoperative image demonstrates posterior surgical instruments along the L3 and L4 spinous processes. Third lateral intraoperative image demonstrates posterior instruments directed at the L4-5 level. IMPRESSION: Intraoperative localization as above. Electronically Signed   By: Rolm Baptise M.D.   On: 09/27/2015 16:39   Ct Lumbar Spine W Contrast  09/06/2015  CLINICAL DATA:  Lower extremity pain left greater than right, with numbness. EXAM: LUMBAR MYELOGRAM CT LUMBAR SPINE WITH INTRATHECAL CONTRAST FLUOROSCOPY TIME:  42 seconds TECHNIQUE: The procedure, risks (including but not limited to bleeding, infection, organ damage ), benefits, and alternatives were explained to the patient. Questions regarding the procedure were encouraged and answered. The patient understands and consents to the procedure. An appropriate entry site was determined under  fluoroscopy. Operator donned sterile gloves and mask. Skin site was marked, prepped with Betadine, and draped in usual sterile fashion, and infiltrated locally with 1% lidocaine. A 22 gauge spinal needle was advanced into the thecal sac at L4 from a right parasagittal approach. Clear colorless CSF returned. 17 ml Omnipaque 180 were administered intrathecally for lumbar myelography, followed by axial CT scanning of the lumbar spine. I personally performed the lumbar puncture and administered the intrathecal contrast. I also personally supervised acquisition of the myelogram images. Coronal and sagittal reconstructions were generated from the axial scan. COMPARISON:  CT myelogram 03/14/2014, and previous FINDINGS: Numbering scheme follows that used on prior exam, 5 non rib-bearing lumbar segments labeled L1-L5. T11-12: Vacuum phenomenon in the interspace anteriorly with exuberant endplate spurs, incompletely visualized. T12-L1: Mild facet DJD and some early thickening of ligamentum flavum. Mild spinal stenosis without cord distortion or flattening. Foramina patent. Conus terminates behind L1. L1-2:  Negative L2-3: Anterior endplate spurring. No disc bulge or protrusion. Central canal and foramina patent. L3-4: Asymmetric disc bulge, left greater than right. Mild spinal stenosis. Foramina patent. L4-5: Bilateral facet degenerative spurring, sclerosis, with vacuum phenomenon, and some thickening of ligamentum flavum. This allows early grade 1 anterolisthesis. There is a mild superimposed circumferential disc bulge contributing to moderate spinal stenosis. Foraminal encroachment right greater than left. L5-S1: Asymmetric advanced facet DJD left worse than right with exuberant spurring, allowing several mm grade 1 anterolisthesis L5 on S1. No pars defect. No disc bulge or protrusion. Foraminal stenosis left worse than right. No central canal stenosis. Moderate aortoiliac arterial calcifications without aneurysm. Minimal  degenerative changes in bilateral sacroiliac joints. Remainder of paraspinal soft tissues unremarkable. IMPRESSION: 1. Mild stenosis T12-L1 related to facet and ligamentous hypertrophy, stable. 2. Asymmetric disc bulge L3-4 with mild spinal stenosis, increased since previous study. 3. Moderate multifactorial spinal stenosis L4-5, stable since prior exam. Facet DJD allows grade 1 anterolisthesis and foraminal encroachment right greater than left. 4. Advanced facet DJD L5-S1 allows grade 1 anterolisthesis, bilateral foraminal stenosis left greater than right, slightly increased since previous exam. Electronically Signed   By: Lucrezia Europe M.D.   On: 09/06/2015 15:13   Dg Lumbar Spine 1 View  09/27/2015  CLINICAL DATA:  Evaluate for foreign body. EXAM: LUMBAR SPINE - 1 VIEW COMPARISON:  None. FINDINGS: Single AP view of the lumbar spine is provided. The curvilinear density seen on C-arm images is not appreciated on the  current exam. There is posterior lumbar interbody fusion from L3 through L5. IMPRESSION: Single AP view of the lumbar spine is provided. The curvilinear density seen on C-arm images is not appreciated on the current exam. Electronically Signed   By: Kathreen Devoid   On: 09/27/2015 17:08   Dg Chest Portable 1 View  10/03/2015  CLINICAL DATA:  Tachycardia EXAM: PORTABLE CHEST 1 VIEW COMPARISON:  06/15/2015 FINDINGS: The heart size and mediastinal contours are within normal limits. Both lungs are clear. The visualized skeletal structures are unremarkable. IMPRESSION: No active disease. Electronically Signed   By: Franchot Gallo M.D.   On: 10/03/2015 15:04   Dg C-arm 1-60 Min  09/27/2015  CLINICAL DATA:  L3-L5 PLIF, intervertebral disc degeneration EXAM: DG C-ARM 61-120 MIN; LUMBAR SPINE - 2-3 VIEW COMPARISON:  CT lumbar spine 09/06/2015, lumbar radiographs 07/17/2015 FLUOROSCOPY TIME:  0 minutes 22 seconds Images obtained:  2 FINDINGS: Five non-rib-bearing lumbar segments on prior radiographs.  Current images demonstrate placement of BILATERAL pedicle screws at L3, L4 and L5 with intervening disc prostheses at L3-L4 and L4-L5 disc spaces. Bones appear demineralized. No fracture or bone destruction. Atherosclerotic calcifications aorta. Curvilinear arm metallic foreign body projects over L4 vertebral body LEFT of midline on the PA view, uncertain etiology, not seen on lateral view. IMPRESSION: Images obtained during posterior fusion L3-L5. Curvilinear foreign body question metallic or wire fragment projecting over L4 vertebral body on PA view, of uncertain etiology and position. Electronically Signed   By: Lavonia Dana M.D.   On: 09/27/2015 16:43   Dg Myelography Lumbar Inj Lumbosacral  09/06/2015  CLINICAL DATA:  Lower extremity pain left greater than right, with numbness. EXAM: LUMBAR MYELOGRAM CT LUMBAR SPINE WITH INTRATHECAL CONTRAST FLUOROSCOPY TIME:  42 seconds TECHNIQUE: The procedure, risks (including but not limited to bleeding, infection, organ damage ), benefits, and alternatives were explained to the patient. Questions regarding the procedure were encouraged and answered. The patient understands and consents to the procedure. An appropriate entry site was determined under fluoroscopy. Operator donned sterile gloves and mask. Skin site was marked, prepped with Betadine, and draped in usual sterile fashion, and infiltrated locally with 1% lidocaine. A 22 gauge spinal needle was advanced into the thecal sac at L4 from a right parasagittal approach. Clear colorless CSF returned. 17 ml Omnipaque 180 were administered intrathecally for lumbar myelography, followed by axial CT scanning of the lumbar spine. I personally performed the lumbar puncture and administered the intrathecal contrast. I also personally supervised acquisition of the myelogram images. Coronal and sagittal reconstructions were generated from the axial scan. COMPARISON:  CT myelogram 03/14/2014, and previous FINDINGS: Numbering  scheme follows that used on prior exam, 5 non rib-bearing lumbar segments labeled L1-L5. T11-12: Vacuum phenomenon in the interspace anteriorly with exuberant endplate spurs, incompletely visualized. T12-L1: Mild facet DJD and some early thickening of ligamentum flavum. Mild spinal stenosis without cord distortion or flattening. Foramina patent. Conus terminates behind L1. L1-2:  Negative L2-3: Anterior endplate spurring. No disc bulge or protrusion. Central canal and foramina patent. L3-4: Asymmetric disc bulge, left greater than right. Mild spinal stenosis. Foramina patent. L4-5: Bilateral facet degenerative spurring, sclerosis, with vacuum phenomenon, and some thickening of ligamentum flavum. This allows early grade 1 anterolisthesis. There is a mild superimposed circumferential disc bulge contributing to moderate spinal stenosis. Foraminal encroachment right greater than left. L5-S1: Asymmetric advanced facet DJD left worse than right with exuberant spurring, allowing several mm grade 1 anterolisthesis L5 on S1. No pars defect.  No disc bulge or protrusion. Foraminal stenosis left worse than right. No central canal stenosis. Moderate aortoiliac arterial calcifications without aneurysm. Minimal degenerative changes in bilateral sacroiliac joints. Remainder of paraspinal soft tissues unremarkable. IMPRESSION: 1. Mild stenosis T12-L1 related to facet and ligamentous hypertrophy, stable. 2. Asymmetric disc bulge L3-4 with mild spinal stenosis, increased since previous study. 3. Moderate multifactorial spinal stenosis L4-5, stable since prior exam. Facet DJD allows grade 1 anterolisthesis and foraminal encroachment right greater than left. 4. Advanced facet DJD L5-S1 allows grade 1 anterolisthesis, bilateral foraminal stenosis left greater than right, slightly increased since previous exam. Electronically Signed   By: Lucrezia Europe M.D.   On: 09/06/2015 15:13    CBC  Recent Labs Lab 09/27/15 1615 09/27/15 1620  09/28/15 0549 10/03/15 1407 10/04/15 0615  WBC  --   --  11.0* 10.1 9.1  HGB 9.9* 10.2* 10.1* 9.2* 8.8*  HCT 29.0* 30.0* 31.1* 27.4* 25.8*  PLT  --   --  176 302 336  MCV  --   --  93.4 93.2 92.5  MCH  --   --  30.3 31.3 31.5  MCHC  --   --  32.5 33.6 34.1  RDW  --   --  12.5 12.4 12.4    Chemistries   Recent Labs Lab 09/27/15 1615 09/27/15 1620 10/03/15 1407 10/03/15 1447 10/04/15 0610  NA 136 137 137  --  136  K 4.3 4.2 3.6  --  4.0  CL  --   --  101  --  101  CO2  --   --  25  --  27  GLUCOSE  --   --  134*  --  108*  BUN  --   --  19  --  11  CREATININE  --   --  0.90  --  0.86  CALCIUM  --   --  8.6*  --  8.7*  MG  --   --   --  1.9  --    ------------------------------------------------------------------------------------------------------------------ estimated creatinine clearance is 98.1 mL/min (by C-G formula based on Cr of 0.86). ------------------------------------------------------------------------------------------------------------------ No results for input(s): HGBA1C in the last 72 hours. ------------------------------------------------------------------------------------------------------------------ No results for input(s): CHOL, HDL, LDLCALC, TRIG, CHOLHDL, LDLDIRECT in the last 72 hours. ------------------------------------------------------------------------------------------------------------------  Recent Labs  10/03/15 1447  TSH 0.651   ------------------------------------------------------------------------------------------------------------------  Recent Labs  10/03/15 1930  VITAMINB12 427  FOLATE 23.3  FERRITIN 117  TIBC 238*  IRON 20*  RETICCTPCT 2.3    Coagulation profile  Recent Labs Lab 10/03/15 1447  INR 1.06    No results for input(s): DDIMER in the last 72 hours.  Cardiac Enzymes  Recent Labs Lab 10/03/15 1930 10/04/15 0610  TROPONINI <0.03 <0.03    ------------------------------------------------------------------------------------------------------------------ Invalid input(s): POCBNP  No results for input(s): GLUCAP in the last 72 hours.   Camellia Popescu M.D. [facility] 10/04/2015, 11:02 AM  Pager: [phone] Between 7am to 7pm - call Pager - [phone]  After 7pm go to [email or web address] - password [email or web address]  Call night coverage person covering after 7pm

## 2015-10-04 NOTE — Progress Notes (Signed)
TELEMETRY: Reviewed telemetry pt in Atrial flutter with controlled rate at rest. Rate increases to 150 with activity.: Filed Vitals:   10/04/15 0300 10/04/15 0500 10/04/15 0700 10/04/15 0832  BP: 120/74 128/80 121/78   Pulse: 86  88   Temp:  99.1 F (37.3 C)  97.4 F (36.3 C)  TempSrc:  Oral  Oral  Resp: 17 12 18    Height:      Weight:  99.111 kg (218 lb 8 oz)    SpO2: 94% 98% 97%     Intake/Output Summary (Last 24 hours) at 10/04/15 0956 Last data filed at 10/04/15 [id number]  Gross per 24 hour  Intake 1090.33 ml  Output   1080 ml  Net  10.33 ml   Filed Weights   10/03/15 1416 10/03/15 1756 10/04/15 0500  Weight: 98.9 kg (218 lb 0.6 oz) 100.744 kg (222 lb 1.6 oz) 99.111 kg (218 lb 8 oz)    Subjective No chest pain or SOB. Denies dizziness or palpitations. Feels well.  Marland Kitchen apixaban  5 mg Oral BID  . diltiazem  90 mg Oral 3 times per day  . ketorolac  1 drop Left Eye QID  . latanoprost  1 drop Both Eyes QHS  . metoprolol tartrate  25 mg Oral BID  . mometasone-formoterol  2 puff Inhalation BID  . montelukast  10 mg Oral QHS  . niacin  1,000 mg Oral Daily  . pantoprazole  40 mg Oral Daily  . sodium chloride  3 mL Intravenous Q12H      LABS: Basic Metabolic Panel:  Recent Labs  10/03/15 1407 10/03/15 1447 10/04/15 0610  NA 137  --  136  K 3.6  --  4.0  CL 101  --  101  CO2 25  --  27  GLUCOSE 134*  --  108*  BUN 19  --  11  CREATININE 0.90  --  0.86  CALCIUM 8.6*  --  8.7*  MG  --  1.9  --    Liver Function Tests: No results for input(s): AST, ALT, ALKPHOS, BILITOT, PROT, ALBUMIN in the last 72 hours. No results for input(s): LIPASE, AMYLASE in the last 72 hours. CBC:  Recent Labs  10/03/15 1407 10/04/15 0615  WBC 10.1 9.1  HGB 9.2* 8.8*  HCT 27.4* 25.8*  MCV 93.2 92.5  PLT 302 336   Cardiac Enzymes:  Recent Labs  10/03/15 1930 10/04/15 0610  TROPONINI <0.03 <0.03   BNP: No results for input(s): PROBNP in the last 72 hours. D-Dimer: No  results for input(s): DDIMER in the last 72 hours. Hemoglobin A1C: No results for input(s): HGBA1C in the last 72 hours. Fasting Lipid Panel: No results for input(s): CHOL, HDL, LDLCALC, TRIG, CHOLHDL, LDLDIRECT in the last 72 hours. Thyroid Function Tests:  Recent Labs  10/03/15 1447  TSH 0.651     Radiology/Studies:  Dg Chest Portable 1 View  10/03/2015  CLINICAL DATA:  Tachycardia EXAM: PORTABLE CHEST 1 VIEW COMPARISON:  06/15/2015 FINDINGS: The heart size and mediastinal contours are within normal limits. Both lungs are clear. The visualized skeletal structures are unremarkable. IMPRESSION: No active disease. Electronically Signed   By: Franchot Gallo M.D.   On: 10/03/2015 15:04   Ecg today shows atrial flutter with controlled rate. I have personally reviewed and interpreted this study.   PHYSICAL EXAM General: Well developed, well nourished, in no acute distress. Head: Normocephalic, atraumatic, sclera non-icteric, oropharynx is clear Neck: Negative for carotid bruits. JVD not elevated.  No adenopathy Lungs: Clear bilaterally to auscultation without wheezes, rales, or rhonchi. Breathing is unlabored. Heart: IRRR S1 S2 without murmurs, rubs, or gallops.  Abdomen: Soft, non-tender, non-distended with normoactive bowel sounds. No hepatomegaly. No rebound/guarding. No obvious abdominal masses. Extremities: No clubbing, cyanosis or edema.  Distal pedal pulses are 2+ and equal bilaterally. Neuro: Alert and oriented X 3. Moves all extremities spontaneously. Psych:  Responds to questions appropriately with a normal affect.  ASSESSMENT AND PLAN: 1. Atrial flutter new onset with RVR. HR controlled at rest on IV diltiazem. He is asymptomatic. Goal is to achieve rate control with PO meds and anticoagulate with Eliquis. Will start cardizem 90 mg po tid and metoprolol 25 mg bid. Try and taper off IV diltiazem. Once rate controlled he can be DC with outpatient follow up. Consider DCCV in one  month. Echo pending today. 2. S/p recent lumbar surgery 3. Anemia iron deficient. Most likely related to acute blood loss from recent surgery as Hgb 10.2 on 12/15. May benefit with iron replacement. 4. HTN 5. COPD 6. History of CVA. Previously on Aggrenox. Will stop now that he is on Eliquis. Should avoid NSAIDs and ASA.  Present on Admission:  . Essential hypertension . COPD GOLD I . Obese . Spondylolisthesis of lumbar region . New onset a-fib ([place]) . Anemia . A-fib [facility])  Signed, Lyris Hitchman [place], [place] 10/04/2015 9:56 AM

## 2015-10-05 ENCOUNTER — Inpatient Hospital Stay (HOSPITAL_COMMUNITY): Payer: Medicare Other

## 2015-10-05 DIAGNOSIS — D509 Iron deficiency anemia, unspecified: Secondary | ICD-10-CM

## 2015-10-05 DIAGNOSIS — I4891 Unspecified atrial fibrillation: Secondary | ICD-10-CM

## 2015-10-05 DIAGNOSIS — I4892 Unspecified atrial flutter: Secondary | ICD-10-CM

## 2015-10-05 LAB — BASIC METABOLIC PANEL
Anion gap: 8 (ref 5–15)
BUN: 15 mg/dL (ref 6–20)
CHLORIDE: 99 mmol/L — AB (ref 101–111)
CO2: 26 mmol/L (ref 22–32)
Calcium: 8.7 mg/dL — ABNORMAL LOW (ref 8.9–10.3)
Creatinine, Ser: 0.93 mg/dL (ref 0.61–1.24)
GFR calc non Af Amer: >60 mL/min (ref >60)
Glucose, Bld: 117 mg/dL — ABNORMAL HIGH (ref 65–99)
POTASSIUM: 4.4 mmol/L (ref 3.5–5.1)
SODIUM: 133 mmol/L — AB (ref 135–145)

## 2015-10-05 LAB — PREPARE RBC (CROSSMATCH)

## 2015-10-05 LAB — CBC
HEMATOCRIT: 23.7 % — AB (ref 39.0–52.0)
HEMOGLOBIN: 7.9 g/dL — AB (ref 13.0–17.0)
MCH: 30.9 pg (ref 26.0–34.0)
MCHC: 33.3 g/dL (ref 30.0–36.0)
MCV: 92.6 fL (ref 78.0–100.0)
Platelets: 320 10*3/uL (ref 150–400)
RBC: 2.56 MIL/uL — AB (ref 4.22–5.81)
RDW: 12.5 % (ref 11.5–15.5)
WBC: 10.7 10*3/uL — ABNORMAL HIGH (ref 4.0–10.5)

## 2015-10-05 LAB — TYPE AND SCREEN
ABO/RH(D): O POS
Antibody Screen: NEGATIVE

## 2015-10-05 LAB — HEMOGLOBIN AND HEMATOCRIT, BLOOD
HEMATOCRIT: 28.8 % — AB (ref 39.0–52.0)
Hemoglobin: 9.6 g/dL — ABNORMAL LOW (ref 13.0–17.0)

## 2015-10-05 MED ORDER — DILTIAZEM HCL ER COATED BEADS 240 MG PO CP24
240.0000 mg | ORAL_CAPSULE | Freq: Every day | ORAL | Status: DC
  Administered 2015-10-05 – 2015-10-06 (×2): 240 mg via ORAL
  Filled 2015-10-05 (×2): qty 1

## 2015-10-05 MED ORDER — SODIUM CHLORIDE 0.9 % IV SOLN
Freq: Once | INTRAVENOUS | Status: AC
  Administered 2015-10-05: 10:00:00 via INTRAVENOUS

## 2015-10-05 NOTE — Progress Notes (Signed)
[facility]                                                                              Patient Demographics  Juan Johnson, is a 64 y.o. male, DOB - 05/08/1951, [id number]  Admit date - 10/03/2015   Admitting Physician Waldemar Dickens, MD  Outpatient Primary MD for the patient is Curly Rim, MD  LOS - 2   Chief Complaint  Patient presents with  . Tachycardia       Brief HPI   Juan Johnson is a very pleasant 64 y.o. male with a past medical history that includes hypertension, COPD, stroke last year, GERD, spondylolisthesis status post lumbar fusion discharged on 12/17 from neurosurgery service, presented with generalized weakness and hypotension. ED workup showed atrial fibrillation with RVR with heart rate of 150, BP 89/71. He denied any chest pressure, palpitations, dizziness or lightheadedness. In the emergency department he is afebrile, hypotensive with a systolic blood pressure of 89 tachycardic with a heart rate of 154. No hypoxia. Patient was admitted to stepdown unit   Assessment & Plan    Principal Problem: New onset A. Fib with RVR: Now back in sinus rhythm - Patient was placed on IV Cardizem drip. Cardiology was consulted, recommended starting beta blocker, Cardizem oral - CHADSVASC 4, patient was placed on IV heparin drip, cardiology recommended starting anticoagulation with eliquis. - Dr. Alvino Chapel, EDP spoke with Dr. Joya Salm with neurosurgery who said okay to anticoagulate - 2-D echo still pending -  patient started on anticoagulation, Eliquis, now hemoglobin dropped to 7.9 this morning. Will transfuse 1 unit packed RBC today, check stool occult test. If FOBT positive, will need to stop anticoagulation, will need to discuss with GI (patient's gastroenterologist, Dr. Collene Mares). Per patient, he had a prior colonoscopy in 5 years had shown polyps and diverticuli.   Hypertension. Home medications include Diovan HCT. - DC'd Diovan/HCTZ -  Continue Cardizem, metoprolol  Anemia. Acute on chronic. Current hemoglobin less than baseline. Likely related to recent blood loss and/or surgery. No signs of active bleeding. 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 Joya Salm  . COPD. Mild. Currently stable at baseline -Continue his home inhalers  . History of stroke.-Residual peripheral vision loss -Patient was on Aggrenox preop, will continue eliquis now, see #1   Code Status: full code  Family Communication: Discussed in detail with the patient, all imaging results, lab results explained to the patient and wife at the bed side   Disposition Plan:    Time Spent in minutes  25 minutes  Procedures  echo  Consults   cardiology  DVT Prophylaxis  apixaban   Medications  Scheduled Meds: . sodium chloride   Intravenous Once  . apixaban  5 mg Oral BID  . diltiazem  240 mg Oral Daily  . ketorolac  1 drop Left Eye QID  . latanoprost  1 drop Both Eyes QHS  . metoprolol tartrate  25 mg Oral BID  . mometasone-formoterol  2 puff Inhalation BID  . montelukast  10 mg Oral QHS  . niacin  1,000 mg Oral Daily  . pantoprazole  40 mg Oral Daily  . sodium chloride  3 mL Intravenous Q12H   Continuous Infusions:  PRN Meds:.sodium chloride, acetaminophen **OR** acetaminophen, albuterol, alum & mag hydroxide-simeth, dextromethorphan-guaiFENesin, HYDROcodone-acetaminophen, ondansetron **OR** ondansetron (ZOFRAN) IV, oxyCODONE, senna-docusate, sodium chloride, traZODone   Antibiotics   Anti-infectives    None        Subjective:   Juan Johnson was seen and examined today.  Patient denies dizziness, chest pain, shortness of breath,  abdominal pain, N/V/D/C, new weakness, numbess, tingling. No acute events overnight. In NSR    Objective:   Blood pressure 113/58, pulse 61, temperature 98.7 F (37.1 C), temperature source Oral, resp. rate 15, height 5\' 9"  (1.753 m), weight 99.882 kg (220 lb 3.2 oz), SpO2 100 %.  Wt Readings from Last 3 Encounters:  10/05/15 99.882 kg (220 lb 3.2 oz)  09/29/15 98.884 kg (218 lb)  09/25/15 101.334 kg (223 lb 6.4 oz)     Intake/Output Summary (Last 24 hours) at 10/05/15 1137 Last data filed at 10/05/15 0447  Gross per 24 hour  Intake    600 ml  Output    775 ml  Net   -175 ml    Exam  General: Alert and oriented x 3, NAD  HEENT:  PERRLA, EOMI, Anicteric Sclera, mucous membranes moist.   Neck: Supple, no JVD, no masses  CVS: S1 S2, RRR  Respiratory: Clear to auscultation bilaterally, no wheezing, rales or rhonchi  Abdomen: Soft, nontender, nondistended, + bowel sounds  Ext: no cyanosis clubbing or edema  Neuro: no new deficits  Skin: No rashes  Psych: Normal affect and demeanor, alert and oriented x3    Data Review   Micro Results Recent Results (from the past 240 hour(s))  MRSA PCR Screening     Status: None   Collection Time: 10/03/15  6:11 PM  Result Value Ref Range Status   MRSA by PCR NEGATIVE NEGATIVE Final    Comment:        The GeneXpert MRSA Assay (FDA approved for NASAL specimens only), is one component of a comprehensive MRSA colonization surveillance program. It is not intended to diagnose MRSA infection nor to guide or monitor treatment for MRSA infections.     Radiology Reports Dg Lumbar Spine 2-3 Views  09/27/2015  CLINICAL DATA:  L3-L5 PLIF, intervertebral disc degeneration EXAM: DG C-ARM 61-120 MIN; LUMBAR SPINE - 2-3 VIEW COMPARISON:  CT lumbar spine 09/06/2015, lumbar radiographs 07/17/2015 FLUOROSCOPY TIME:  0 minutes 22 seconds Images obtained:  2 FINDINGS: Five non-rib-bearing lumbar segments on prior radiographs. Current images  demonstrate placement of BILATERAL pedicle screws at L3, L4 and L5 with intervening disc prostheses at L3-L4 and L4-L5 disc spaces. Bones appear demineralized. No fracture or bone destruction. Atherosclerotic calcifications aorta. Curvilinear arm metallic foreign body projects over L4 vertebral body LEFT of midline on the PA view, uncertain etiology, not seen on lateral view. IMPRESSION: Images obtained during posterior fusion L3-L5. Curvilinear foreign body question metallic or wire fragment projecting over L4 vertebral body on PA view, of uncertain etiology and position. Electronically Signed   By: Lavonia Dana M.D.   On: 09/27/2015 16:43   Dg Lumbar Spine 2-3 Views  09/27/2015  CLINICAL DATA:  L3-L5 posterior fusion.  Localization. EXAM: LUMBAR SPINE -  2-3 VIEW COMPARISON:  Myelogram 09/06/2015 FINDINGS: First lateral intraoperative image demonstrates posterior surgical instruments along the L2 and L3 spinous processes. Second lateral intraoperative image demonstrates posterior surgical instruments along the L3 and L4 spinous processes. Third lateral intraoperative image demonstrates posterior instruments directed at the L4-5 level. IMPRESSION: Intraoperative localization as above. Electronically Signed   By: Rolm Baptise M.D.   On: 09/27/2015 16:39   Ct Lumbar Spine W Contrast  09/06/2015  CLINICAL DATA:  Lower extremity pain left greater than right, with numbness. EXAM: LUMBAR MYELOGRAM CT LUMBAR SPINE WITH INTRATHECAL CONTRAST FLUOROSCOPY TIME:  42 seconds TECHNIQUE: The procedure, risks (including but not limited to bleeding, infection, organ damage ), benefits, and alternatives were explained to the patient. Questions regarding the procedure were encouraged and answered. The patient understands and consents to the procedure. An appropriate entry site was determined under fluoroscopy. Operator donned sterile gloves and mask. Skin site was marked, prepped with Betadine, and draped in usual sterile  fashion, and infiltrated locally with 1% lidocaine. A 22 gauge spinal needle was advanced into the thecal sac at L4 from a right parasagittal approach. Clear colorless CSF returned. 17 ml Omnipaque 180 were administered intrathecally for lumbar myelography, followed by axial CT scanning of the lumbar spine. I personally performed the lumbar puncture and administered the intrathecal contrast. I also personally supervised acquisition of the myelogram images. Coronal and sagittal reconstructions were generated from the axial scan. COMPARISON:  CT myelogram 03/14/2014, and previous FINDINGS: Numbering scheme follows that used on prior exam, 5 non rib-bearing lumbar segments labeled L1-L5. T11-12: Vacuum phenomenon in the interspace anteriorly with exuberant endplate spurs, incompletely visualized. T12-L1: Mild facet DJD and some early thickening of ligamentum flavum. Mild spinal stenosis without cord distortion or flattening. Foramina patent. Conus terminates behind L1. L1-2:  Negative L2-3: Anterior endplate spurring. No disc bulge or protrusion. Central canal and foramina patent. L3-4: Asymmetric disc bulge, left greater than right. Mild spinal stenosis. Foramina patent. L4-5: Bilateral facet degenerative spurring, sclerosis, with vacuum phenomenon, and some thickening of ligamentum flavum. This allows early grade 1 anterolisthesis. There is a mild superimposed circumferential disc bulge contributing to moderate spinal stenosis. Foraminal encroachment right greater than left. L5-S1: Asymmetric advanced facet DJD left worse than right with exuberant spurring, allowing several mm grade 1 anterolisthesis L5 on S1. No pars defect. No disc bulge or protrusion. Foraminal stenosis left worse than right. No central canal stenosis. Moderate aortoiliac arterial calcifications without aneurysm. Minimal degenerative changes in bilateral sacroiliac joints. Remainder of paraspinal soft tissues unremarkable. IMPRESSION: 1. Mild  stenosis T12-L1 related to facet and ligamentous hypertrophy, stable. 2. Asymmetric disc bulge L3-4 with mild spinal stenosis, increased since previous study. 3. Moderate multifactorial spinal stenosis L4-5, stable since prior exam. Facet DJD allows grade 1 anterolisthesis and foraminal encroachment right greater than left. 4. Advanced facet DJD L5-S1 allows grade 1 anterolisthesis, bilateral foraminal stenosis left greater than right, slightly increased since previous exam. Electronically Signed   By: Lucrezia Europe M.D.   On: 09/06/2015 15:13   Dg Lumbar Spine 1 View  09/27/2015  CLINICAL DATA:  Evaluate for foreign body. EXAM: LUMBAR SPINE - 1 VIEW COMPARISON:  None. FINDINGS: Single AP view of the lumbar spine is provided. The curvilinear density seen on C-arm images is not appreciated on the current exam. There is posterior lumbar interbody fusion from L3 through L5. IMPRESSION: Single AP view of the lumbar spine is provided. The curvilinear density seen on C-arm images is not appreciated  on the current exam. Electronically Signed   By: Kathreen Devoid   On: 09/27/2015 17:08   Dg Chest Portable 1 View  10/03/2015  CLINICAL DATA:  Tachycardia EXAM: PORTABLE CHEST 1 VIEW COMPARISON:  06/15/2015 FINDINGS: The heart size and mediastinal contours are within normal limits. Both lungs are clear. The visualized skeletal structures are unremarkable. IMPRESSION: No active disease. Electronically Signed   By: Franchot Gallo M.D.   On: 10/03/2015 15:04   Dg C-arm 1-60 Min  09/27/2015  CLINICAL DATA:  L3-L5 PLIF, intervertebral disc degeneration EXAM: DG C-ARM 61-120 MIN; LUMBAR SPINE - 2-3 VIEW COMPARISON:  CT lumbar spine 09/06/2015, lumbar radiographs 07/17/2015 FLUOROSCOPY TIME:  0 minutes 22 seconds Images obtained:  2 FINDINGS: Five non-rib-bearing lumbar segments on prior radiographs. Current images demonstrate placement of BILATERAL pedicle screws at L3, L4 and L5 with intervening disc prostheses at L3-L4 and  L4-L5 disc spaces. Bones appear demineralized. No fracture or bone destruction. Atherosclerotic calcifications aorta. Curvilinear arm metallic foreign body projects over L4 vertebral body LEFT of midline on the PA view, uncertain etiology, not seen on lateral view. IMPRESSION: Images obtained during posterior fusion L3-L5. Curvilinear foreign body question metallic or wire fragment projecting over L4 vertebral body on PA view, of uncertain etiology and position. Electronically Signed   By: Lavonia Dana M.D.   On: 09/27/2015 16:43   Dg Myelography Lumbar Inj Lumbosacral  09/06/2015  CLINICAL DATA:  Lower extremity pain left greater than right, with numbness. EXAM: LUMBAR MYELOGRAM CT LUMBAR SPINE WITH INTRATHECAL CONTRAST FLUOROSCOPY TIME:  42 seconds TECHNIQUE: The procedure, risks (including but not limited to bleeding, infection, organ damage ), benefits, and alternatives were explained to the patient. Questions regarding the procedure were encouraged and answered. The patient understands and consents to the procedure. An appropriate entry site was determined under fluoroscopy. Operator donned sterile gloves and mask. Skin site was marked, prepped with Betadine, and draped in usual sterile fashion, and infiltrated locally with 1% lidocaine. A 22 gauge spinal needle was advanced into the thecal sac at L4 from a right parasagittal approach. Clear colorless CSF returned. 17 ml Omnipaque 180 were administered intrathecally for lumbar myelography, followed by axial CT scanning of the lumbar spine. I personally performed the lumbar puncture and administered the intrathecal contrast. I also personally supervised acquisition of the myelogram images. Coronal and sagittal reconstructions were generated from the axial scan. COMPARISON:  CT myelogram 03/14/2014, and previous FINDINGS: Numbering scheme follows that used on prior exam, 5 non rib-bearing lumbar segments labeled L1-L5. T11-12: Vacuum phenomenon in the  interspace anteriorly with exuberant endplate spurs, incompletely visualized. T12-L1: Mild facet DJD and some early thickening of ligamentum flavum. Mild spinal stenosis without cord distortion or flattening. Foramina patent. Conus terminates behind L1. L1-2:  Negative L2-3: Anterior endplate spurring. No disc bulge or protrusion. Central canal and foramina patent. L3-4: Asymmetric disc bulge, left greater than right. Mild spinal stenosis. Foramina patent. L4-5: Bilateral facet degenerative spurring, sclerosis, with vacuum phenomenon, and some thickening of ligamentum flavum. This allows early grade 1 anterolisthesis. There is a mild superimposed circumferential disc bulge contributing to moderate spinal stenosis. Foraminal encroachment right greater than left. L5-S1: Asymmetric advanced facet DJD left worse than right with exuberant spurring, allowing several mm grade 1 anterolisthesis L5 on S1. No pars defect. No disc bulge or protrusion. Foraminal stenosis left worse than right. No central canal stenosis. Moderate aortoiliac arterial calcifications without aneurysm. Minimal degenerative changes in bilateral sacroiliac joints. Remainder of paraspinal soft  tissues unremarkable. IMPRESSION: 1. Mild stenosis T12-L1 related to facet and ligamentous hypertrophy, stable. 2. Asymmetric disc bulge L3-4 with mild spinal stenosis, increased since previous study. 3. Moderate multifactorial spinal stenosis L4-5, stable since prior exam. Facet DJD allows grade 1 anterolisthesis and foraminal encroachment right greater than left. 4. Advanced facet DJD L5-S1 allows grade 1 anterolisthesis, bilateral foraminal stenosis left greater than right, slightly increased since previous exam. Electronically Signed   By: Lucrezia Europe M.D.   On: 09/06/2015 15:13    CBC  Recent Labs Lab 10/03/15 1407 10/04/15 0615 10/05/15 0657  WBC 10.1 9.1 10.7*  HGB 9.2* 8.8* 7.9*  HCT 27.4* 25.8* 23.7*  PLT 302 336 320  MCV 93.2 92.5 92.6    MCH 31.3 31.5 30.9  MCHC 33.6 34.1 33.3  RDW 12.4 12.4 12.5    Chemistries   Recent Labs Lab 10/03/15 1407 10/03/15 1447 10/04/15 0610 10/05/15 0657  NA 137  --  136 133*  K 3.6  --  4.0 4.4  CL 101  --  101 99*  CO2 25  --  27 26  GLUCOSE 134*  --  108* 117*  BUN 19  --  11 15  CREATININE 0.90  --  0.86 0.93  CALCIUM 8.6*  --  8.7* 8.7*  MG  --  1.9  --   --    ------------------------------------------------------------------------------------------------------------------ estimated creatinine clearance is 91.1 mL/min (by C-G formula based on Cr of 0.93). ------------------------------------------------------------------------------------------------------------------ No results for input(s): HGBA1C in the last 72 hours. ------------------------------------------------------------------------------------------------------------------ No results for input(s): CHOL, HDL, LDLCALC, TRIG, CHOLHDL, LDLDIRECT in the last 72 hours. ------------------------------------------------------------------------------------------------------------------  Recent Labs  10/03/15 1447  TSH 0.651   ------------------------------------------------------------------------------------------------------------------  Recent Labs  10/03/15 1930  VITAMINB12 427  FOLATE 23.3  FERRITIN 117  TIBC 238*  IRON 20*  RETICCTPCT 2.3    Coagulation profile  Recent Labs Lab 10/03/15 1447  INR 1.06    No results for input(s): DDIMER in the last 72 hours.  Cardiac Enzymes  Recent Labs Lab 10/03/15 1930 10/04/15 0610  TROPONINI <0.03 <0.03   ------------------------------------------------------------------------------------------------------------------ Invalid input(s): POCBNP  No results for input(s): GLUCAP in the last 72 hours.   RAI,RIPUDEEP M.D. [facility] 10/05/2015, 11:37 AM  Pager: [phone] Between 7am to 7pm - call Pager - [phone]  After 7pm go to  [email or web address] - password [email or web address]  Call night coverage person covering after 7pm

## 2015-10-05 NOTE — Progress Notes (Signed)
  Echocardiogram 2D Echocardiogram has been performed.  Darlina Sicilian M 10/05/2015, 1:58 PM

## 2015-10-05 NOTE — Progress Notes (Signed)
TELEMETRY: Reviewed telemetry pt converted to NSR at noon yesterday  Filed Vitals:   10/04/15 2354 10/05/15 0439 10/05/15 0755 10/05/15 0903  BP: 93/53 117/54 118/66   Pulse: 69 65 62   Temp: 99.8 F (37.7 C) 99 F (37.2 C) 98.4 F (36.9 C)   TempSrc: Oral Oral Oral   Resp: 20 15    Height:      Weight:  99.882 kg (220 lb 3.2 oz)    SpO2: 98% 97% 95% 99%    Intake/Output Summary (Last 24 hours) at 10/05/15 1001 Last data filed at 10/05/15 0447  Gross per 24 hour  Intake    600 ml  Output    775 ml  Net   -175 ml   Filed Weights   10/03/15 1756 10/04/15 0500 10/05/15 0439  Weight: 100.744 kg (222 lb 1.6 oz) 99.111 kg (218 lb 8 oz) 99.882 kg (220 lb 3.2 oz)    Subjective No chest pain or SOB. Denies dizziness or palpitations. Feels well. Reports normal BM today  . sodium chloride   Intravenous Once  . apixaban  5 mg Oral BID  . diltiazem  240 mg Oral Daily  . ketorolac  1 drop Left Eye QID  . latanoprost  1 drop Both Eyes QHS  . metoprolol tartrate  25 mg Oral BID  . mometasone-formoterol  2 puff Inhalation BID  . montelukast  10 mg Oral QHS  . niacin  1,000 mg Oral Daily  . pantoprazole  40 mg Oral Daily  . sodium chloride  3 mL Intravenous Q12H      LABS: Basic Metabolic Panel:  Recent Labs  10/03/15 1447 10/04/15 0610 10/05/15 0657  NA  --  136 133*  K  --  4.0 4.4  CL  --  101 99*  CO2  --  27 26  GLUCOSE  --  108* 117*  BUN  --  11 15  CREATININE  --  0.86 0.93  CALCIUM  --  8.7* 8.7*  MG 1.9  --   --    Liver Function Tests: No results for input(s): AST, ALT, ALKPHOS, BILITOT, PROT, ALBUMIN in the last 72 hours. No results for input(s): LIPASE, AMYLASE in the last 72 hours. CBC:  Recent Labs  10/04/15 0615 10/05/15 0657  WBC 9.1 10.7*  HGB 8.8* 7.9*  HCT 25.8* 23.7*  MCV 92.5 92.6  PLT 336 320   Cardiac Enzymes:  Recent Labs  10/03/15 1930 10/04/15 0610  TROPONINI <0.03 <0.03   BNP: No results for input(s): PROBNP in the  last 72 hours. D-Dimer: No results for input(s): DDIMER in the last 72 hours. Hemoglobin A1C: No results for input(s): HGBA1C in the last 72 hours. Fasting Lipid Panel: No results for input(s): CHOL, HDL, LDLCALC, TRIG, CHOLHDL, LDLDIRECT in the last 72 hours. Thyroid Function Tests:  Recent Labs  10/03/15 1447  TSH 0.651     Radiology/Studies:  Dg Chest Portable 1 View  10/03/2015  CLINICAL DATA:  Tachycardia EXAM: PORTABLE CHEST 1 VIEW COMPARISON:  06/15/2015 FINDINGS: The heart size and mediastinal contours are within normal limits. Both lungs are clear. The visualized skeletal structures are unremarkable. IMPRESSION: No active disease. Electronically Signed   By: Franchot Gallo M.D.   On: 10/03/2015 15:04     PHYSICAL EXAM General: Well developed, well nourished, in no acute distress. Head: Normocephalic, atraumatic, sclera non-icteric, oropharynx is clear Neck: Negative for carotid bruits. JVD not elevated. No adenopathy Lungs: Clear bilaterally to  auscultation without wheezes, rales, or rhonchi. Breathing is unlabored. Heart: IRRR S1 S2 without murmurs, rubs, or gallops.  Abdomen: Soft, non-tender, non-distended with normoactive bowel sounds. No hepatomegaly. No rebound/guarding. No obvious abdominal masses. Extremities: No clubbing, cyanosis or edema.  Distal pedal pulses are 2+ and equal bilaterally. Neuro: Alert and oriented X 3. Moves all extremities spontaneously. Psych:  Responds to questions appropriately with a normal affect.  ASSESSMENT AND PLAN: 1. Atrial flutter new onset with RVR.  He is asymptomatic. Fortunately he converted to NSR. Possibly related to recent stress of surgery. Echo still pending. Will switch cardizem to LA formulation. Continue metoprolol. On Eliquis. 2. S/p recent lumbar surgery 3. Anemia iron deficient. Hgb has continued to drop raising concern over occult bleeding. On iron replacement. Will heme check stool. May need to hold anticoagulation  if Hbg dropping.  4. HTN 5. COPD 6. History of CVA. Previously on Aggrenox. Will stop now that he is on Eliquis. Should avoid NSAIDs and ASA.  Present on Admission:  . Essential hypertension . COPD GOLD I . Obese . Spondylolisthesis of lumbar region . (Resolved) New onset a-fib ([place]) . Anemia . (Resolved) A-fib ([place]) . New onset atrial flutter ([place])  Signed, Mickie Badders [place], [place] 10/05/2015 10:01 AM

## 2015-10-05 NOTE — Care Management Note (Addendum)
Case Management Note  Patient Details  Name: Juan Johnson MRN: [id number] Date of Birth: 05-23-1951  Subjective/Objective:    Pt admitted for new onset Atrial Flutter. Pt is from home with family support. Previously active with [facility] for [facility] PT- may benefit from [facility] RN as well.               Action/Plan: CM did make referral to [facility]- pt will need HHRN and PT orders with F2F. SOC to begin within 24-48 hrs post d/c.  No further needs from CM at this time.   Expected Discharge Date:                  Expected Discharge Plan:  [place]  In-House Referral:  NA  Discharge planning Services  CM Consult  Post Acute Care Choice:  Home Health Choice offered to:  Patient  DME Arranged:  N/A DME Agency:     HH Arranged:  PT, RN [place] Agency:  [place]  Status of Service:  Completed, signed off  Medicare Important Message Given:    Date Medicare IM Given:    Medicare IM give by:    Date Additional Medicare IM Given:    Additional Medicare Important Message give by:     If discussed at [place] of Stay Meetings, dates discussed:    Additional Comments: Eliquis 30 day free Card provided to pt- CM will call patient's [facility] and the medication is in stock. No further needs at this time.   Bethena Roys, RN 10/05/2015, 11:50 AM

## 2015-10-05 NOTE — Discharge Instructions (Signed)

## 2015-10-05 NOTE — Progress Notes (Signed)
Patient ID: Juan Johnson, male   DOB: [birth year], 64 y.o.   MRN: [id number] Neuro doing well. Wound healing

## 2015-10-06 LAB — CBC
HCT: 27.2 % — ABNORMAL LOW (ref 39.0–52.0)
Hemoglobin: 9.2 g/dL — ABNORMAL LOW (ref 13.0–17.0)
MCH: 31.2 pg (ref 26.0–34.0)
MCHC: 33.8 g/dL (ref 30.0–36.0)
MCV: 92.2 fL (ref 78.0–100.0)
PLATELETS: 343 10*3/uL (ref 150–400)
RBC: 2.95 MIL/uL — ABNORMAL LOW (ref 4.22–5.81)
RDW: 12.6 % (ref 11.5–15.5)
WBC: 11.5 10*3/uL — AB (ref 4.0–10.5)

## 2015-10-06 LAB — TYPE AND SCREEN
ABO/RH(D): O POS
ANTIBODY SCREEN: NEGATIVE
Unit division: 0

## 2015-10-06 LAB — OCCULT BLOOD X 1 CARD TO LAB, STOOL: FECAL OCCULT BLD: NEGATIVE

## 2015-10-06 MED ORDER — DILTIAZEM HCL ER COATED BEADS 240 MG PO CP24: 240.0000 mg | ORAL_CAPSULE | Freq: Every day | ORAL | Status: DC

## 2015-10-06 MED ORDER — METOPROLOL TARTRATE 25 MG PO TABS: 25.0000 mg | ORAL_TABLET | Freq: Two times a day (BID) | ORAL | Status: DC

## 2015-10-06 MED ORDER — APIXABAN 5 MG PO TABS: 5.0000 mg | ORAL_TABLET | Freq: Two times a day (BID) | ORAL | Status: DC

## 2015-10-06 NOTE — Progress Notes (Signed)
Patient ID: Juan Johnson, male   DOB: [birth year], 64 y.o.   MRN: [id number] Neuro stable

## 2015-10-06 NOTE — Progress Notes (Signed)
Patient discharge paperwork gone over in detail with patient and family. All questions answered to patient satisfaction. Patient has Eliquis card. Telemetry discontinued. Iv removed intact. Patient discharged to home with home health by way of wheelchair with family.

## 2015-10-06 NOTE — Care Management Important Message (Signed)
Important Message  Patient Details  Name: Juan Johnson MRN: [id number] Date of Birth: 1951/09/01   Medicare Important Message Given:  Yes    Nathen May 10/06/2015, 3:14 PM

## 2015-10-06 NOTE — Progress Notes (Signed)
TELEMETRY: NSR  Filed Vitals:   10/06/15 0240 10/06/15 0500 10/06/15 0953 10/06/15 0956  BP:  126/62 121/54   Pulse:  63  63  Temp:  98.4 F (36.9 C)    TempSrc:  Oral    Resp:  16    Height:      Weight: 98.612 kg (217 lb 6.4 oz)     SpO2:  97%      Intake/Output Summary (Last 24 hours) at 10/06/15 1029 Last data filed at 10/06/15 0840  Gross per 24 hour  Intake   1423 ml  Output    850 ml  Net    573 ml   Filed Weights   10/04/15 0500 10/05/15 0439 10/06/15 0240  Weight: 99.111 kg (218 lb 8 oz) 99.882 kg (220 lb 3.2 oz) 98.612 kg (217 lb 6.4 oz)    Subjective No chest pain or SOB. Denies dizziness or palpitations. Feels well.   Marland Kitchen apixaban  5 mg Oral BID  . diltiazem  240 mg Oral Daily  . ketorolac  1 drop Left Eye QID  . latanoprost  1 drop Both Eyes QHS  . metoprolol tartrate  25 mg Oral BID  . mometasone-formoterol  2 puff Inhalation BID  . montelukast  10 mg Oral QHS  . niacin  1,000 mg Oral Daily  . pantoprazole  40 mg Oral Daily  . sodium chloride  3 mL Intravenous Q12H      LABS: Basic Metabolic Panel:  Recent Labs  10/03/15 1447 10/04/15 0610 10/05/15 0657  NA  --  136 133*  K  --  4.0 4.4  CL  --  101 99*  CO2  --  27 26  GLUCOSE  --  108* 117*  BUN  --  11 15  CREATININE  --  0.86 0.93  CALCIUM  --  8.7* 8.7*  MG 1.9  --   --    Liver Function Tests: No results for input(s): AST, ALT, ALKPHOS, BILITOT, PROT, ALBUMIN in the last 72 hours. No results for input(s): LIPASE, AMYLASE in the last 72 hours. CBC:  Recent Labs  10/05/15 0657 10/05/15 2019 10/06/15 0430  WBC 10.7*  --  11.5*  HGB 7.9* 9.6* 9.2*  HCT 23.7* 28.8* 27.2*  MCV 92.6  --  92.2  PLT 320  --  343   Cardiac Enzymes:  Recent Labs  10/03/15 1930 10/04/15 0610  TROPONINI <0.03 <0.03   BNP: No results for input(s): PROBNP in the last 72 hours. D-Dimer: No results for input(s): DDIMER in the last 72 hours. Hemoglobin A1C: No results for input(s): HGBA1C in  the last 72 hours. Fasting Lipid Panel: No results for input(s): CHOL, HDL, LDLCALC, TRIG, CHOLHDL, LDLDIRECT in the last 72 hours. Thyroid Function Tests:  Recent Labs  10/03/15 1447  TSH 0.651     Radiology/Studies:  No results found.   PHYSICAL EXAM General: Well developed, well nourished, in no acute distress. Head: Normocephalic, atraumatic, sclera non-icteric, oropharynx is clear Neck: Negative for carotid bruits. JVD not elevated. No adenopathy Lungs: Clear bilaterally to auscultation without wheezes, rales, or rhonchi. Breathing is unlabored. Heart: IRRR S1 S2 without murmurs, rubs, or gallops.  Abdomen: Soft, non-tender, non-distended with normoactive bowel sounds. No hepatomegaly. No rebound/guarding. No obvious abdominal masses. Extremities: No clubbing, cyanosis or edema.  Distal pedal pulses are 2+ and equal bilaterally. Neuro: Alert and oriented X 3. Moves all extremities spontaneously. Psych:  Responds to questions appropriately with a normal affect.  ASSESSMENT AND PLAN: 1. Atrial flutter new onset with RVR.  He is asymptomatic. Fortunately he converted to NSR. Possibly related to recent stress of surgery. Echo with mild to moderate MR. Otherwise normal.  Continue metoprolol and Cardizem CD. On Eliquis. 2. S/p recent lumbar surgery 3. Anemia iron deficient. Hgb has increased.  On iron replacement. Stool heme negative.  4. HTN 5. COPD 6. History of CVA. Previously on Aggrenox. Will stop now that he is on Eliquis. Should avoid NSAIDs and ASA.  Stable for DC today from our standpoint. Will arrange outpatient follow up.  Present on Admission:  . Essential hypertension . COPD GOLD I . Obese . Spondylolisthesis of lumbar region . (Resolved) New onset a-fib ([place]) . Anemia . (Resolved) A-fib ([place]) . New onset atrial flutter ([place])  Signed, Peter Martinique, [place] 10/06/2015 10:29 AM

## 2015-10-06 NOTE — Discharge Summary (Signed)
Physician Discharge Summary   Patient ID: Juan Johnson MRN: [id number] DOB/AGE: June 01, 1951 64 y.o.  Admit date: 10/03/2015 Discharge date: 10/06/2015  Primary Care Physician:  Curly Rim, MD  Discharge Diagnoses:     New onset paroxysmal atrial fibrillation with RVR: Now back in sinus rhythm . Essential hypertension . COPD GOLD I . Obese . Spondylolisthesis of lumbar region .  Anemia   Consults:  Cardiology, Dr. Martinique  Recommendations for Outpatient Follow-up:  1. Home health PT, OT resumed 2. Please repeat CBC/BMET at next visit     DIET: heart healthy diet    Allergies:   Allergies  Allergen Reactions  . Crestor [Rosuvastatin] Other (See Comments)    Cramps and muscle weakness  . Simvastatin Other (See Comments)    Cramps and muscle weakness     DISCHARGE MEDICATIONS: Discharge Medication List as of 10/06/2015 10:13 AM    START taking these medications   Details  diltiazem (CARDIZEM CD) 240 MG 24 hr capsule Take 1 capsule (240 mg total) by mouth daily., Starting 10/06/2015, Until Discontinued, Print    metoprolol tartrate (LOPRESSOR) 25 MG tablet Take 1 tablet (25 mg total) by mouth 2 (two) times daily., Starting 10/06/2015, Until Discontinued, Print      CONTINUE these medications which have CHANGED   Details  apixaban (ELIQUIS) 5 MG TABS tablet Take 1 tablet (5 mg total) by mouth 2 (two) times daily., Starting 10/06/2015, Until Discontinued, Print      CONTINUE these medications which have NOT CHANGED   Details  acetaminophen (TYLENOL) 500 MG tablet Take 500 mg by mouth every 4 (four) hours as needed for mild pain or moderate pain (Takes 2 every 4- 6 hours). , Until Discontinued, Historical Med    albuterol (PROAIR HFA) 108 (90 BASE) MCG/ACT inhaler Inhale 2 puffs into the lungs every 6 (six) hours as needed for wheezing or shortness of breath., Starting 04/11/2015, Until Discontinued, Normal    Cetirizine HCl (ZYRTEC PO) Take 10 mg by  mouth daily. , Until Discontinued, Historical Med    dextromethorphan-guaiFENesin (MUCINEX DM) 30-600 MG 12hr tablet Take 1 tablet by mouth 2 (two) times daily as needed for cough. , Until Discontinued, Historical Med    ezetimibe (ZETIA) 10 MG tablet Take 10 mg by mouth daily., Until Discontinued, Historical Med    Fluticasone-Salmeterol (ADVAIR) 100-50 MCG/DOSE AEPB Inhale 1 puff into the lungs 2 (two) times daily., Until Discontinued, Historical Med    ketorolac (ACULAR) 0.5 % ophthalmic solution Place 1 drop into the left eye 4 (four) times daily. , Starting 08/22/2014, Until Discontinued, Historical Med    latanoprost (XALATAN) 0.005 % ophthalmic solution Place 1 drop into both eyes at bedtime. , Until Discontinued, Historical Med    montelukast (SINGULAIR) 10 MG tablet Take 10 mg by mouth at bedtime. , Starting 03/09/2015, Until Discontinued, Historical Med    Multiple Vitamins-Minerals (MULTIVITAMIN WITH MINERALS) tablet Take 1 tablet by mouth daily., Until Discontinued, Historical Med    niacin (NIASPAN) 1000 MG CR tablet Take 1,000 mg by mouth daily., Starting 03/09/2015, Until Discontinued, Historical Med    omeprazole (PRILOSEC) 20 MG capsule Take 20 mg by mouth 2 (two) times daily before a meal. , Starting 03/09/2015, Until Discontinued, Historical Med    oxyCODONE (ROXICODONE) 15 MG immediate release tablet Take 1 tablet (15 mg total) by mouth every 4 (four) hours as needed for severe pain., Starting 09/30/2015, Until Discontinued, Print      STOP taking these medications  aspirin EC 81 MG tablet      valsartan-hydrochlorothiazide (DIOVAN HCT) 160-12.5 MG per tablet          Brief H and P: For complete details please refer to admission H and P, but in brief Juan Johnson is a very pleasant 64 y.o. male with a past medical history that includes hypertension, COPD, stroke last year, GERD, spondylolisthesis status post lumbar fusion discharged on 12/17 from neurosurgery  service, presented with generalized weakness and hypotension. ED workup showed atrial fibrillation with RVR with heart rate of 150, BP 89/71. He denied any chest pressure, palpitations, dizziness or lightheadedness. In the emergency department he is afebrile, hypotensive with a systolic blood pressure of 89 tachycardic with a heart rate of 154. No hypoxia. Patient was admitted to stepdown unit.  Hospital Course:  New onset A. Fib with RVR, paroxysmal: Now back in sinus rhythm - Patient was placed on IV Cardizem drip. Cardiology was consulted, recommended starting beta blocker and Cardizem.  - CHADSVASC 4, patient was placed on IV heparin drip, cardiology recommended starting anticoagulation with eliquis. - Dr. Alvino Chapel, EDP spoke with Dr. Joya Salm with neurosurgery who said okay to anticoagulate - 2-D echo showed EF of 55-60%, normal wall motion - hemoglobin dropped to 7.9 on 12/22, FOBT/ stool occult test was negative. Patient was transfused 1 unit packed RBC. Please check CBC at the time of follow-up appointment.  Hypertension. Home medications include Diovan HCT. - DC'd Diovan/HCTZ. Continue Cardizem, metoprolol  Anemia. Acute on chronic. Current hemoglobin less than baseline. Likely related to recent blood loss and/or surgery. No signs of active bleeding. - Anemia panel showed iron deficiency anemia, placed on iron replacement - Patient was started on anticoagulation on admission, hemoglobin down to 7.9 today, transfused 1 unit. Hemoglobin 9.2 at the time of discharge. Stool occult test for blood was negative.  Spondylolisthesis of lumbar status post lumbar laminectomy with fusion 12/15 - PTOT evaluation-> home health PT - follow outpatient with Dr. Joya Salm  . COPD. Mild. Currently stable at baseline -Continue his home inhalers  . History of stroke.-Residual peripheral vision loss -Patient was on Aggrenox preop, will continue eliquis now. Patient was strongly recommended to avoid  NSAIDs, aspirin while on eliquis   Day of Discharge BP 121/54 mmHg  Pulse 63  Temp(Src) 98.4 F (36.9 C) (Oral)  Resp 16  Ht 5\' 9"  (1.753 m)  Wt 98.612 kg (217 lb 6.4 oz)  BMI 32.09 kg/m2  SpO2 97%  Physical Exam: General: Alert and awake oriented x3 not in any acute distress. HEENT: anicteric sclera, pupils reactive to light and accommodation CVS: S1-S2 clear no murmur rubs or gallops Chest: clear to auscultation bilaterally, no wheezing rales or rhonchi Abdomen: soft nontender, nondistended, normal bowel sounds Extremities: no cyanosis, clubbing or edema noted bilaterally Neuro: Cranial nerves II-XII intact, no focal neurological deficits   The results of significant diagnostics from this hospitalization (including imaging, microbiology, ancillary and laboratory) are listed below for reference.    LAB RESULTS: Basic Metabolic Panel:  Recent Labs Lab 10/03/15 1447 10/04/15 0610 10/05/15 0657  NA  --  136 133*  K  --  4.0 4.4  CL  --  101 99*  CO2  --  27 26  GLUCOSE  --  108* 117*  BUN  --  11 15  CREATININE  --  0.86 0.93  CALCIUM  --  8.7* 8.7*  MG 1.9  --   --    Liver Function Tests: No results  for input(s): AST, ALT, ALKPHOS, BILITOT, PROT, ALBUMIN in the last 168 hours. No results for input(s): LIPASE, AMYLASE in the last 168 hours. No results for input(s): AMMONIA in the last 168 hours. CBC:  Recent Labs Lab 10/05/15 0657 10/05/15 2019 10/06/15 0430  WBC 10.7*  --  11.5*  HGB 7.9* 9.6* 9.2*  HCT 23.7* 28.8* 27.2*  MCV 92.6  --  92.2  PLT 320  --  343   Cardiac Enzymes:  Recent Labs Lab 10/03/15 1930 10/04/15 0610  TROPONINI <0.03 <0.03   BNP: Invalid input(s): POCBNP CBG: No results for input(s): GLUCAP in the last 168 hours.  Significant Diagnostic Studies:  Dg Chest Portable 1 View  10/03/2015  CLINICAL DATA:  Tachycardia EXAM: PORTABLE CHEST 1 VIEW COMPARISON:  06/15/2015 FINDINGS: The heart size and mediastinal contours are  within normal limits. Both lungs are clear. The visualized skeletal structures are unremarkable. IMPRESSION: No active disease. Electronically Signed   By: Franchot Gallo M.D.   On: 10/03/2015 15:04    2D ECHO: Study Conclusions  - Left ventricle: The cavity size was normal. Wall thickness was normal. Systolic function was normal. The estimated ejection fraction was in the range of 55% to 60%. Wall motion was normal; there were no regional wall motion abnormalities. Left ventricular diastolic function parameters were normal. - Mitral valve: There was mild to moderate regurgitation. - Pulmonary arteries: PA peak pressure: 43 mm Hg (S).  Disposition and Follow-up: Discharge Instructions    Diet - low sodium heart healthy    Complete by:  As directed      Increase activity slowly    Complete by:  As directed             DISPOSITION: HOME    DISCHARGE FOLLOW-UP Follow-up Information    Follow up with Curly Rim, MD. Go on 10/20/2015.   Specialty:  Family Medicine   Why:  for hospital follow-up @ 10am   Contact information:   [facility] [address] [place] [address] [phone]       Follow up with HAGER, BRYAN, PA-C. Go on 10/23/2015.   Specialties:  Physician Assistant, Radiology, Interventional Cardiology   Why:  Cardiology Hospital Follow-Up on 10/23/2015 at 2:30PM (Dr. Doug Sou Office)   Contact information:   [address] [place] [address] [phone]       Follow up with [facility].   Why:  Physical Therapy and Registered Nurse   Contact information:   [address] [place] [address] [phone]        Time spent on Discharge: 23 MINS   Signed:   RAI,RIPUDEEP M.D. [facility] 10/06/2015, 12:00 PM Pager: [phone]

## 2015-10-20 DIAGNOSIS — J449 Chronic obstructive pulmonary disease, unspecified: Secondary | ICD-10-CM | POA: Diagnosis not present

## 2015-10-20 DIAGNOSIS — D509 Iron deficiency anemia, unspecified: Secondary | ICD-10-CM | POA: Diagnosis not present

## 2015-10-23 ENCOUNTER — Encounter: Payer: Self-pay | Admitting: Physician Assistant

## 2015-10-23 ENCOUNTER — Ambulatory Visit (INDEPENDENT_AMBULATORY_CARE_PROVIDER_SITE_OTHER): Payer: Medicare Other | Admitting: Physician Assistant

## 2015-10-23 VITALS — BP 116/56 | HR 56 | Ht 69.0 in | Wt 214.0 lb

## 2015-10-23 DIAGNOSIS — I10 Essential (primary) hypertension: Secondary | ICD-10-CM

## 2015-10-23 DIAGNOSIS — I4892 Unspecified atrial flutter: Secondary | ICD-10-CM

## 2015-10-23 DIAGNOSIS — I340 Nonrheumatic mitral (valve) insufficiency: Secondary | ICD-10-CM | POA: Insufficient documentation

## 2015-10-23 NOTE — Progress Notes (Signed)
Patient ID: Juan Johnson, male   DOB: 1951-01-22, 65 y.o.   MRN: [id number]    Date:  10/23/2015   ID:  Juan Johnson, DOB 1950-12-18, MRN [id number]  PCP:  Curly Rim, MD  Primary Cardiologist:  [facility]  Chief Complaint  Patient presents with  . Follow-up    [place]. NO COMPLAINTS.     History of Present Illness: Juan Johnson is a 65 y.o. male with a history of A-Fib, HTN, COPD, Stroke, OSA, anxiety, depression, glaucoma, anemia. He is a former patient of Dr. Tilley(> 10 years ago) who apparently did a heart cath which revealed no CAD. He presented 10/03/15 with Atrial flutter. He gets physical therapy, because of back surgery he had on the 14th. The therapist identified an elevated HR 150's and recommended he com to the ER. CHADSVASC 4.  Eliquis wsa started. He had an echocardiogram 10/05/15 revealing and EF of 55-60%, normal wall motion, LA was normal in size, mild to moderate MR.  He was started on Cardizem 90 mg PO TID and metoprolol 25bid.  He converted spontaneously.    He now presents for follow up.  He is wearing a back brace because of the surgery.  He is taking all meds as prescribed.   He otherwise feels fine and denies nausea, vomiting, fever, chest pain, shortness of breath, orthopnea, dizziness, PND, cough, congestion, abdominal pain, hematochezia, melena, lower extremity edema, claudication.    Wt Readings from Last 3 Encounters:  10/23/15 214 lb (97.07 kg)  10/06/15 217 lb 6.4 oz (98.612 kg)  09/29/15 218 lb (98.884 kg)     Past Medical History  Diagnosis Date  . Hypertension   . Arthritis   . COPD (chronic obstructive pulmonary disease) ([place])   . Pneumonia 04/2015    "bronchial" pneumonia   . Stroke [facility]) ? 05/2014    peripheral vision loss  . Sleep apnea     does use cpap  . Depression   . Anxiety     claustrophobia  . GERD (gastroesophageal reflux disease)   . Enlarged liver     20-25 years ago  . Retinal tear of left eye   .  Glaucoma   . 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 Complication of anesthesia     difficult to wake up after neck surgery  . A-fib ([place])   . Anemia   . Atrial fibrillation, new onset ([place]) 10/03/2015    Current Outpatient Prescriptions  Medication Sig Dispense Refill  . acetaminophen (TYLENOL) 500 MG tablet Take 500 mg by mouth every 4 (four) hours as needed for mild pain or moderate pain (Takes 2 every 4- 6 hours).     Marland Kitchen apixaban (ELIQUIS) 5 MG TABS tablet Take 1 tablet (5 mg total) by mouth 2 (two) times daily. 60 tablet 0  . Cetirizine HCl (ZYRTEC PO) Take 10 mg by mouth daily.     Marland Kitchen dextromethorphan-guaiFENesin (MUCINEX DM) 30-600 MG 12hr tablet Take 1 tablet by mouth 2 (two) times daily as needed for cough.     . diltiazem (CARDIZEM CD) 240 MG 24 hr capsule Take 1 capsule (240 mg total) by mouth daily. 30 capsule 3  . ezetimibe (ZETIA) 10 MG tablet Take 10 mg by mouth daily.    . Fluticasone-Salmeterol (ADVAIR) 100-50 MCG/DOSE AEPB Inhale 1 puff into the lungs 2 (two) times daily.    Marland Kitchen ketorolac (ACULAR) 0.5 % ophthalmic solution Place  1 drop into the left eye 4 (four) times daily.     Marland Kitchen latanoprost (XALATAN) 0.005 % ophthalmic solution Place 1 drop into both eyes at bedtime.     . metoprolol tartrate (LOPRESSOR) 25 MG tablet Take 1 tablet (25 mg total) by mouth 2 (two) times daily. 60 tablet 3  . montelukast (SINGULAIR) 10 MG tablet Take 10 mg by mouth at bedtime.     . Multiple Vitamins-Minerals (MULTIVITAMIN WITH MINERALS) tablet Take 1 tablet by mouth daily.    . niacin (NIASPAN) 1000 MG CR tablet Take 1,000 mg by mouth daily.    Marland Kitchen omeprazole (PRILOSEC) 20 MG capsule Take 20 mg by mouth 2 (two) times daily before a meal.     . oxyCODONE (ROXICODONE) 15 MG immediate release tablet Take 1 tablet (15 mg total) by mouth every 4 (four) hours as needed for severe pain. 60 tablet 0   No current facility-administered medications for this visit.    Allergies:      Allergies  Allergen Reactions  . Crestor [Rosuvastatin] Other (See Comments)    Cramps and muscle weakness  . Simvastatin Other (See Comments)    Cramps and muscle weakness    Social History:  The patient  reports that he quit smoking about 18 years ago. His smoking use included Cigarettes. He has a 69 pack-year smoking history. He has never used smokeless tobacco. He reports that he does not drink alcohol or use illicit drugs.   Family history:   Family History  Problem Relation Age of Onset  . Diabetes    . Hypothyroidism    . Hypertension Mother   . Cancer    . Diabetes type II Brother   . Diabetes type II Brother   . Hyperthyroidism    . Cataracts    . Glaucoma    . Macular degeneration Mother   . Stroke Father   . Emphysema Father     smoked    ROS:  Please see the history of present illness.  All other systems reviewed and negative.   PHYSICAL EXAM: VS:  BP 116/56 mmHg  Pulse 56  Ht 5\' 9"  (1.753 m)  Wt 214 lb (97.07 kg)  BMI 31.59 kg/m2 Well nourished, well developed, in no acute distress HEENT: Pupils are equal round react to light accommodation extraocular movements are intact.  Neck: no JVDNo cervical lymphadenopathy. Cardiac: Regular rate and rhythm without murmurs rubs or gallops. Lungs:  clear to auscultation bilaterally, no wheezing, rhonchi or rales Ext: no lower extremity edema.  2+ radial and dorsalis pedis pulses. Skin: warm and dry Neuro:  Grossly normal    ASSESSMENT AND PLAN:  Problem List Items Addressed This Visit    New onset atrial flutter (HCC)   Mitral valve regurgitation: mild to moderate   Essential hypertension - Primary     He is doing well and recovering from back surgery.  He is in a regular rhythm on exam.  Rate 56.  Continue Eliquis, metoprolol and cardizem.  We will keep an eye on the MR.  BP well controlled.  Follow up in 3 months with Dr. Martinique.

## 2015-10-23 NOTE — Patient Instructions (Signed)
Continue same medications   Appointment with Dr.Jordan Thursday 01/11/16 at 8:45 am

## 2015-11-08 DIAGNOSIS — R0602 Shortness of breath: Secondary | ICD-10-CM | POA: Diagnosis not present

## 2015-11-08 DIAGNOSIS — G4733 Obstructive sleep apnea (adult) (pediatric): Secondary | ICD-10-CM | POA: Diagnosis not present

## 2015-11-08 DIAGNOSIS — J449 Chronic obstructive pulmonary disease, unspecified: Secondary | ICD-10-CM | POA: Diagnosis not present

## 2015-11-09 DIAGNOSIS — M5416 Radiculopathy, lumbar region: Secondary | ICD-10-CM | POA: Diagnosis not present

## 2015-11-09 DIAGNOSIS — I10 Essential (primary) hypertension: Secondary | ICD-10-CM | POA: Diagnosis not present

## 2015-11-09 DIAGNOSIS — Z6834 Body mass index (BMI) 34.0-34.9, adult: Secondary | ICD-10-CM | POA: Diagnosis not present

## 2015-11-14 ENCOUNTER — Ambulatory Visit: Payer: Medicare Other | Attending: Neurosurgery | Admitting: Physical Therapy

## 2015-11-14 DIAGNOSIS — M5442 Lumbago with sciatica, left side: Secondary | ICD-10-CM | POA: Diagnosis not present

## 2015-11-14 DIAGNOSIS — R5381 Other malaise: Secondary | ICD-10-CM

## 2015-11-14 NOTE — Therapy (Signed)
[facility] [place], [place], [address] Phone: [phone]   Fax:  [phone]  Physical Therapy Evaluation  Patient Details  Name: Juan Johnson MRN: [id number] Date of Birth: 1951/09/12 Referring Provider: Leeroy Cha MD  Encounter Date: 11/14/2015      PT End of Session - 11/14/15 1842    Visit Number 1   Number of Visits 16   Date for PT Re-Evaluation 01/16/16   PT Start Time 0950   PT Stop Time 1040   PT Time Calculation (min) 50 min   Activity Tolerance Patient tolerated treatment well   Behavior During Therapy [facility] for tasks assessed/performed      Past Medical History  Diagnosis Date  . Hypertension   . Arthritis   . COPD (chronic obstructive pulmonary disease) ([employer])   . Pneumonia 04/2015    "bronchial" pneumonia   . Stroke [facility]) ? 05/2014    peripheral vision loss  . Sleep apnea     does use cpap  . Depression   . Anxiety     claustrophobia  . GERD (gastroesophageal reflux disease)   . Enlarged liver     20-25 years ago  . Retinal tear of left eye   . Glaucoma   . 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 Complication of anesthesia     difficult to wake up after neck surgery  . A-fib ([place])   . Anemia   . Atrial fibrillation, new onset ([place]) 10/03/2015    Past Surgical History  Procedure Laterality Date  . Shoulder surgery  09/24/2011  . Eye surgery  10/2007  . Neck surgery  06/29/2010  . Tonsillectomy      AT AGE 11  . Leg surgery      AT AGE 32  . Vasectomy    . Colonoscopy    . Cardiac catheterization  2003  . Lumbar fusion  09/27/2015    There were no vitals filed for this visit.  Visit Diagnosis:  Bilateral low back pain with left-sided sciatica - Plan: PT plan of care cert/re-cert  Debility - Plan: PT plan of care cert/re-cert      Subjective Assessment - 11/14/15 1844    Subjective Patient states surgery has helped but some of his leg pain has returned.    Limitations Sitting;Standing   How long can you sit comfortably? 10-15 minutes.   How long can you stand comfortably? 10 minutes.   Patient Stated Goals Get back to a normal life.   Currently in Pain? Yes   Pain Score 4    Pain Location Back   Pain Orientation Left   Pain Descriptors / Indicators Aching;[place]   Pain Type Surgical pain   Pain Onset More than a month ago   Pain Frequency Constant   Aggravating Factors  Certian movements and getting up and down.   Pain Relieving Factors Walking.            [facility] PT Assessment - 11/14/15 0001    Assessment   Medical Diagnosis Radiculopathy of lumbar region.   Referring Provider Leeroy Cha MD   Onset Date/Surgical Date --  09/27/15 (surgery date).   Hand Dominance Right   Next MD Visit --  03/04/16.   Prior Therapy --  8 months ago.   Precautions   Precautions --  Lumbar fusion protocol..   Precaution Comments No bending, twisting or extending.   Required Braces or Orthoses  Spinal Brace   Restrictions   Weight Bearing Restrictions No   Balance Screen   Has the patient fallen in the past 6 months Yes   How many times? 1.Marland KitchenMarland KitchenThinks knee may have given way.   Has the patient had a decrease in activity level because of a fear of falling?  Yes   Is the patient reluctant to leave their home because of a fear of falling?  No   Home Ec[employer] residence   Prior Function   Level of Independence Independent   Cognition   Memory Impaired   Memory Impairment Decreased short term memory   Decreased Short Term Memory --  Per pt. due to CVA.   Posture/Postural Control   Posture/Postural Control Postural limitations   Postural Limitations Rounded Shoulders;Decreased lumbar lordosis   Posture Comments Patient stand is slight lumbar flexion.   ROM / Strength   AROM / PROM / Strength AROM;Strength   AROM   Overall AROM Comments In supine bilateral hip flexion= 110 degrees though ROM on the right it  produced a [place] pain which the patient thinks is due to arthritis.   Strength   Overall Strength Comments Normal bilateral knee and ankle strength.   Palpation   Palpation comment Very tender and left of lumbar incisional site with musculature very guarded.   Special Tests    Special Tests --  1+/4+ bil Pat/Ach DTR's.   Transfers   Transfers Sit to Stand;Supine to Sit   Sit to Stand 5: Supervision  Painful.   Supine to Sit 5: Supervision  Painful.   Ambulation/Gait   Gait Pattern Decreased step length - right;Decreased step length - left;Decreased stride length;Shuffle;Antalgic                   [facility] Adult PT Treatment/Exercise - 11/14/15 0001    Modalities   Modalities Electrical Stimulation;Moist Heat   Moist Heat Therapy   Number Minutes Moist Heat 15 Minutes   Moist Heat Location Lumbar Spine   Electrical Stimulation   Electrical Stimulation Location Lower lumbar   Electrical Stimulation Action IFC   Electrical Stimulation Parameters 80-150 HZ at 100% scan x 15 minutes.   Electrical Stimulation Goals Pain                  PT Short Term Goals - 11/14/15 1900    PT SHORT TERM GOAL #1   Title Ind with an initial HEP.           PT Long Term Goals - 11/14/15 1904    PT LONG TERM GOAL #1   Title Patient sit 30 minutes with pain not > 3/10.   Time 8   Period Weeks   Status New   PT LONG TERM GOAL #2   Title Patient stand 15 minutes with pain not > 3/10.   Time 8   Period Weeks   Status New   PT LONG TERM GOAL #3   Title Perform ADL's with pain not > 3/10.   Time 8   Period Weeks   Status New   PT LONG TERM GOAL #4   Title Eliminate LE pain.   Time 8   Period Weeks   Status New   PT LONG TERM GOAL #5   Title Walk a community distance wiht pain not > 3/10.               Plan - 11/14/15 1852    Clinical Impression  Statement The patient underwent a lumbar fusion surgery on 09/27/15.  He states he has been pleased with his  surgical outcome but did experience a twisting type fall after surgery which increased his LE pain.  He states he is now using a cane.  He states thankfully a friend was near him and therefore did not experience a full force fall and he friend was able to help him up.  His pain-level upon presentation to the clinic today his a 4/10.  His pain-level rises to higher levels (6-7/10) with transitory movements and movement of his hips.  He had home health physical therapy and was instructed in various exercises including bilateral single knee to stretching.  Walking decreases his pain.  Prolonged sitting and standing increases he pain also.   Pt will benefit from skilled therapeutic intervention in order to improve on the following deficits Pain;Decreased activity tolerance   Rehab Potential Good   PT Frequency 2x / week   PT Duration 8 weeks   PT Treatment/Interventions ADLs/Self Care Home Management;Electrical Stimulation;Moist Heat;Therapeutic exercise;Therapeutic activities;Patient/family education;Manual techniques   PT Next Visit Plan Lumbar fusion protocol.  Nustep; Draw-ins.  Avoid twisting, bending and extension.   Consulted and Agree with Plan of Care Patient          G-Codes - Nov 24, 2015 1906    Functional Assessment Tool Used FOTO....73% limitation.   Functional Limitation Mobility: Walking and moving around   Mobility: Walking and Moving Around Current Status [phone]) At least 60 percent but less than 80 percent impaired, limited or restricted   Mobility: Walking and Moving Around Goal Status [phone]) At least 20 percent but less than 40 percent impaired, limited or restricted       Problem List Patient Active Problem List   Diagnosis Date Noted  . Mitral valve regurgitation: mild to moderate 10/23/2015  . New onset atrial flutter ([place]) 10/04/2015  . Atrial fibrillation with rapid ventricular response ([place])   . Anemia 10/03/2015  . Stroke ([place])   . Spondylolisthesis of lumbar region  09/27/2015  . Lobar pneumonia due to unspecified organism 05/25/2015  . Snoring 03/16/2015  . Witnessed apneic spells 03/16/2015  . Obese 03/16/2015  . Morning headache 03/16/2015  . Excessive daytime sleepiness 03/16/2015  . COPD GOLD I 09/25/2014  . Upper airway cough syndrome vs cough variant asthma 08/22/2014  . Essential hypertension 08/22/2014  . Diplopia 06/25/2014  . Vision loss, bilateral 06/25/2014  . Weakness 06/25/2014  . Unspecified hereditary and idiopathic peripheral neuropathy 06/25/2014    Kirklin Mcduffee, [place] MPT 11/24/15, 7:19 PM  [facility] [place], [place], [address] Phone: [phone]   Fax:  [phone]  Name: Juan Johnson MRN: [id number] Date of Birth: 12/02/1950

## 2015-11-17 ENCOUNTER — Ambulatory Visit: Payer: Medicare Other | Attending: Neurosurgery | Admitting: Physical Therapy

## 2015-11-17 ENCOUNTER — Encounter: Payer: Self-pay | Admitting: Physical Therapy

## 2015-11-17 DIAGNOSIS — M5442 Lumbago with sciatica, left side: Secondary | ICD-10-CM

## 2015-11-17 DIAGNOSIS — M2560 Stiffness of unspecified joint, not elsewhere classified: Secondary | ICD-10-CM

## 2015-11-17 DIAGNOSIS — R262 Difficulty in walking, not elsewhere classified: Secondary | ICD-10-CM | POA: Diagnosis not present

## 2015-11-17 DIAGNOSIS — M5416 Radiculopathy, lumbar region: Secondary | ICD-10-CM

## 2015-11-17 DIAGNOSIS — R5381 Other malaise: Secondary | ICD-10-CM | POA: Diagnosis not present

## 2015-11-17 NOTE — Therapy (Signed)
[facility] [place], [place], [address] Phone: [phone]   Fax:  [phone]  Physical Therapy Treatment  Patient Details  Name: Juan Johnson MRN: [id number] Date of Birth: [birth year] Referring Provider: Leeroy Cha MD  Encounter Date: 11/17/2015      PT End of Session - 11/17/15 1124    Visit Number 2   Number of Visits 16   Date for PT Re-Evaluation 01/16/16   PT Start Time 1120   PT Stop Time 1206   PT Time Calculation (min) 46 min   Activity Tolerance Patient tolerated treatment well   Behavior During Therapy [facility] for tasks assessed/performed      Past Medical History  Diagnosis Date  . Hypertension   . Arthritis   . COPD (chronic obstructive pulmonary disease) ([place])   . Pneumonia 04/2015    "bronchial" pneumonia   . Stroke [facility]) ? 05/2014    peripheral vision loss  . Sleep apnea     does use cpap  . Depression   . Anxiety     claustrophobia  . GERD (gastroesophageal reflux disease)   . Enlarged liver     20-25 years ago  . Retinal tear of left eye   . Glaucoma   . 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 Complication of anesthesia     difficult to wake up after neck surgery  . A-fib ([place])   . Anemia   . Atrial fibrillation, new onset ([place]) 10/03/2015    Past Surgical History  Procedure Laterality Date  . Shoulder surgery  09/24/2011  . Eye surgery  10/2007  . Neck surgery  06/29/2010  . Tonsillectomy      AT AGE 65  . Leg surgery      AT AGE 65  . Vasectomy    . Colonoscopy    . Cardiac catheterization  2003  . Lumbar fusion  09/27/2015    There were no vitals filed for this visit.  Visit Diagnosis:  Bilateral low back pain with left-sided sciatica  Debility  Chronic lumbar radiculopathy  Joint stiffness of spine  Difficulty in walking      Subjective Assessment - 11/17/15 1121    Subjective Patient reports that upon waking is difficult and so is  standing up from sitting. Reports complaince with HEP from HHPT and completed exercises this morning.   Limitations Sitting;Standing   How long can you sit comfortably? 10-15 minutes.   How long can you stand comfortably? 10 minutes.   Patient Stated Goals Get back to a normal life.   Currently in Pain? No/denies            [facility] PT Assessment - 11/17/15 0001    Assessment   Medical Diagnosis Radiculopathy of lumbar region.   Onset Date/Surgical Date 09/27/15   Next MD Visit 03/04/2016   Precautions   Precaution Comments No bending, twisting or extending.   Required Braces or Orthoses Spinal Brace   Restrictions   Weight Bearing Restrictions No                     [facility] Adult PT Treatment/Exercise - 11/17/15 0001    Exercises   Exercises Lumbar   Lumbar Exercises: Aerobic   Stationary Bike NuStep L3 x16 min with VCs for core activation  Experienced intermittant LLE burning sensation   Lumbar Exercises: Supine   Heel Slides 20 reps;Other (comment)  BLE  Bent Knee Raise 20 reps;Other (comment)  Reported sleep sensation in lateral R thigh   Dead Bug 20 reps  Felt quick hot sensation in buttocks that quickly dissipated   Bridge Other (comment)  Attempted but reported pain around area of surgery completed   Straight Leg Raise 10 reps;Other (comment)  LLE hot sensation following 10 reps but not in RLE   Other Supine Lumbar Exercises 2# ball to lap x20 reps   Other Supine Lumbar Exercises B D2 PNF with 2# ball x20 reps each  Began to experience pin sensation in L low back                  PT Short Term Goals - 11/14/15 1900    PT SHORT TERM GOAL #1   Title Ind with an initial HEP.           PT Long Term Goals - 11/14/15 1904    PT LONG TERM GOAL #1   Title Patient sit 30 minutes with pain not > 3/10.   Time 8   Period Weeks   Status New   PT LONG TERM GOAL #2   Title Patient stand 15 minutes with pain not > 3/10.   Time 8   Period Weeks    Status New   PT LONG TERM GOAL #3   Title Perform ADL's with pain not > 3/10.   Time 8   Period Weeks   Status New   PT LONG TERM GOAL #4   Title Eliminate LE pain.   Time 8   Period Weeks   Status New   PT LONG TERM GOAL #5   Title Walk a community distance wiht pain not > 3/10.               Plan - 11/17/15 1203    Clinical Impression Statement Patient tolerated today's treatment fairly well although he experienced intermittant burning or pain in low back or LEs particularly the LLE. Patient reported compliance with an HEP given by HHPT and was encouraged to continue HEP. All core exercises completed today were completed with VCs for core activation to improve core strength and improve lumbar stability. Patient had increased "locking" sensation in low back and buttocks upon standing from seated positioning and had difficulty with supine to sitting transitional movement as well. Patient denied pain following today's treatment and ambulated during treatment with SPC.   Pt will benefit from skilled therapeutic intervention in order to improve on the following deficits Pain;Decreased activity tolerance   Rehab Potential Good   PT Frequency 2x / week   PT Duration 8 weeks   PT Treatment/Interventions ADLs/Self Care Home Management;Electrical Stimulation;Moist Heat;Therapeutic exercise;Therapeutic activities;Patient/family education;Manual techniques   PT Next Visit Plan Continue with core strengthening while adhering to lumbar fusion precautions per MPT POC.   Consulted and Agree with Plan of Care Patient        Problem List Patient Active Problem List   Diagnosis Date Noted  . Mitral valve regurgitation: mild to moderate 10/23/2015  . New onset atrial flutter ([place]) 10/04/2015  . Atrial fibrillation with rapid ventricular response ([place])   . Anemia 10/03/2015  . Stroke ([place])   . Spondylolisthesis of lumbar region 09/27/2015  . Lobar pneumonia due to unspecified organism  05/25/2015  . Snoring 03/16/2015  . Witnessed apneic spells 03/16/2015  . Obese 03/16/2015  . Morning headache 03/16/2015  . Excessive daytime sleepiness 03/16/2015  . COPD GOLD I 09/25/2014  . Upper airway  cough syndrome vs cough variant asthma 08/22/2014  . Essential hypertension 08/22/2014  . Diplopia 06/25/2014  . Vision loss, bilateral 06/25/2014  . Weakness 06/25/2014  . Unspecified hereditary and idiopathic peripheral neuropathy 06/25/2014    Wynelle Fanny, PTA 11/17/2015, 12:30 PM  [facility] [place], [place], [address] Phone: [phone]   Fax:  [phone]  Name: GENESIS PIRL MRN: [id number] Date of Birth: 11/08/1950

## 2015-11-20 ENCOUNTER — Ambulatory Visit: Payer: Medicare Other | Admitting: Physical Therapy

## 2015-11-20 ENCOUNTER — Encounter: Payer: Self-pay | Admitting: Physical Therapy

## 2015-11-20 DIAGNOSIS — R262 Difficulty in walking, not elsewhere classified: Secondary | ICD-10-CM

## 2015-11-20 DIAGNOSIS — M5416 Radiculopathy, lumbar region: Secondary | ICD-10-CM

## 2015-11-20 DIAGNOSIS — M2560 Stiffness of unspecified joint, not elsewhere classified: Secondary | ICD-10-CM | POA: Diagnosis not present

## 2015-11-20 DIAGNOSIS — R5381 Other malaise: Secondary | ICD-10-CM

## 2015-11-20 DIAGNOSIS — M5442 Lumbago with sciatica, left side: Secondary | ICD-10-CM | POA: Diagnosis not present

## 2015-11-20 NOTE — Therapy (Signed)
[facility] [place], [place], [address] Phone: [phone]   Fax:  [phone]  Physical Therapy Treatment  Patient Details  Name: Juan Johnson MRN: [id number] Date of Birth: 1951/07/04 Referring Provider: Leeroy Cha MD  Encounter Date: 11/20/2015      PT End of Session - 11/20/15 0946    Visit Number 3   Number of Visits 16   Date for PT Re-Evaluation 01/16/16   PT Start Time 0949   PT Stop Time 1042   PT Time Calculation (min) 53 min   Activity Tolerance Patient tolerated treatment well;Patient limited by pain   Behavior During Therapy [facility] for tasks assessed/performed      Past Medical History  Diagnosis Date  . Hypertension   . Arthritis   . COPD (chronic obstructive pulmonary disease) ([place])   . Pneumonia 04/2015    "bronchial" pneumonia   . Stroke [facility]) ? 05/2014    peripheral vision loss  . Sleep apnea     does use cpap  . Depression   . Anxiety     claustrophobia  . GERD (gastroesophageal reflux disease)   . Enlarged liver     20-25 years ago  . Retinal tear of left eye   . Glaucoma   . 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 Complication of anesthesia     difficult to wake up after neck surgery  . A-fib ([place])   . Anemia   . Atrial fibrillation, new onset ([place]) 10/03/2015    Past Surgical History  Procedure Laterality Date  . Shoulder surgery  09/24/2011  . Eye surgery  10/2007  . Neck surgery  06/29/2010  . Tonsillectomy      AT AGE 1  . Leg surgery      AT AGE 60  . Vasectomy    . Colonoscopy    . Cardiac catheterization  2003  . Lumbar fusion  09/27/2015    There were no vitals filed for this visit.  Visit Diagnosis:  Bilateral low back pain with left-sided sciatica  Debility  Chronic lumbar radiculopathy  Joint stiffness of spine  Difficulty in walking      Subjective Assessment - 11/20/15 0949    Subjective Reports that his back feels good  following rest yesterday. Reports that he did a lot of walking Saturday. States that he had pain with laying down on plinth today.   Limitations Sitting;Standing   How long can you sit comfortably? 10-15 minutes.   How long can you stand comfortably? 10 minutes.   Patient Stated Goals Get back to a normal life.   Currently in Pain? Other (Comment)  Had no complaint at beginning of treatment            [facility] PT Assessment - 11/20/15 0001    Assessment   Medical Diagnosis Radiculopathy of lumbar region.   Onset Date/Surgical Date 09/27/15   Next MD Visit 03/04/2016   Precautions   Precaution Comments No bending, twisting or extending.   Required Braces or Orthoses Spinal Brace   Restrictions   Weight Bearing Restrictions No                     [facility] Adult PT Treatment/Exercise - 11/20/15 0001    Lumbar Exercises: Stretches   Single Knee to Chest Stretch 3 reps;30 seconds;Other (comment)  BLE; experienced catching sensation with lowering of LE in R   Lumbar  Exercises: Aerobic   Stationary Bike NuStep L5 x15 min   Lumbar Exercises: Standing   Row Strengthening;Both;Theraband  3x10 reps   Theraband Level (Row) Level 2 (Red)   Lumbar Exercises: Supine   Heel Slides 20 reps;Other (comment)  BLE; experienced hot and hurt sensation with LLE quickly   Bent Knee Raise 20 reps;Other (comment)  Experienced light sleep sensation in LLE   Dead Bug 20 reps  R side s dissipated and L side slowly dissipated   Straight Leg Raise 10 reps;Other (comment)  BLE; catching sensation in low back with each rep   Other Supine Lumbar Exercises 2# ball to lap x20 reps   Other Supine Lumbar Exercises B D2 PNF with 2# ball x20 reps each  Experienced pin sensation to LLE, intermittant with RLE                  PT Short Term Goals - 11/14/15 1900    PT SHORT TERM GOAL #1   Title Ind with an initial HEP.           PT Long Term Goals - 11/14/15 1904    PT LONG TERM GOAL  #1   Title Patient sit 30 minutes with pain not > 3/10.   Time 8   Period Weeks   Status New   PT LONG TERM GOAL #2   Title Patient stand 15 minutes with pain not > 3/10.   Time 8   Period Weeks   Status New   PT LONG TERM GOAL #3   Title Perform ADL's with pain not > 3/10.   Time 8   Period Weeks   Status New   PT LONG TERM GOAL #4   Title Eliminate LE pain.   Time 8   Period Weeks   Status New   PT LONG TERM GOAL #5   Title Walk a community distance wiht pain not > 3/10.               Plan - 11/20/15 1033    Clinical Impression Statement Patient tolerated today's treatment fairly well although he continued to experience intermittant LE and low back symptoms. Patient had difficulty again with transitional movements such as sit to supine and sit to stands. Patient is competant with log rolling technique but experienced pain with short sitting and also experienced pain with standing to sitting in R low back. Tolerated supine exercises fairly well but intermittant R low back symptoms. Patient experienced catching sensation in R low back with [facility] with lowering of legs in R low back. No abnormal tightness noted in R low back musculature with patient in standing. Patient had no adverse complaints during standing row with thereband today and only had hot sensation from L [place] to ankle with NuStep. Goals remain on-going at this time secondary to pain with activities as well as continued LE symptoms.    Pt will benefit from skilled therapeutic intervention in order to improve on the following deficits Pain;Decreased activity tolerance   Rehab Potential Good   PT Frequency 2x / week   PT Duration 8 weeks   PT Treatment/Interventions ADLs/Self Care Home Management;Electrical Stimulation;Moist Heat;Therapeutic exercise;Therapeutic activities;Patient/family education;Manual techniques   PT Next Visit Plan Continue with core strengthening while adhering to lumbar fusion precautions per  MPT POC. Consider manual therapy to R low back next treatment.   Consulted and Agree with Plan of Care Patient        Problem List Patient Active Problem List  Diagnosis Date Noted  . Mitral valve regurgitation: mild to moderate 10/23/2015  . New onset atrial flutter ([place]) 10/04/2015  . Atrial fibrillation with rapid ventricular response ([place])   . Anemia 10/03/2015  . Stroke ([place])   . Spondylolisthesis of lumbar region 09/27/2015  . Lobar pneumonia due to unspecified organism 05/25/2015  . Snoring 03/16/2015  . Witnessed apneic spells 03/16/2015  . Obese 03/16/2015  . Morning headache 03/16/2015  . Excessive daytime sleepiness 03/16/2015  . COPD GOLD I 09/25/2014  . Upper airway cough syndrome vs cough variant asthma 08/22/2014  . Essential hypertension 08/22/2014  . Diplopia 06/25/2014  . Vision loss, bilateral 06/25/2014  . Weakness 06/25/2014  . Unspecified hereditary and idiopathic peripheral neuropathy 06/25/2014    Wynelle Fanny, PTA 11/20/2015, 10:50 AM  [facility] [address] [place], [place], [address] Phone: [phone]   Fax:  [phone]  Name: Juan Johnson MRN: [id number] Date of Birth: [birth year]

## 2015-11-21 ENCOUNTER — Ambulatory Visit: Payer: Medicare Other | Admitting: Neurology

## 2015-11-23 ENCOUNTER — Ambulatory Visit: Payer: Medicare Other | Admitting: Physical Therapy

## 2015-11-23 ENCOUNTER — Encounter: Payer: Self-pay | Admitting: Physical Therapy

## 2015-11-23 DIAGNOSIS — M5416 Radiculopathy, lumbar region: Secondary | ICD-10-CM | POA: Diagnosis not present

## 2015-11-23 DIAGNOSIS — R5381 Other malaise: Secondary | ICD-10-CM

## 2015-11-23 DIAGNOSIS — M2560 Stiffness of unspecified joint, not elsewhere classified: Secondary | ICD-10-CM

## 2015-11-23 DIAGNOSIS — M5442 Lumbago with sciatica, left side: Secondary | ICD-10-CM

## 2015-11-23 DIAGNOSIS — R262 Difficulty in walking, not elsewhere classified: Secondary | ICD-10-CM | POA: Diagnosis not present

## 2015-11-23 NOTE — Therapy (Signed)
[facility] [place], [place], [address] Phone: [phone]   Fax:  [phone]  Physical Therapy Treatment  Patient Details  Name: Juan Johnson MRN: [id number] Date of Birth: 1951-03-01 Referring Provider: Leeroy Cha MD  Encounter Date: 11/23/2015      PT End of Session - 11/23/15 1140    Visit Number 4   Number of Visits 16   Date for PT Re-Evaluation 01/16/16   PT Start Time 0947   PT Stop Time 1032   PT Time Calculation (min) 45 min   Activity Tolerance Patient tolerated treatment well;Patient limited by pain   Behavior During Therapy [facility] for tasks assessed/performed      Past Medical History  Diagnosis Date  . Hypertension   . Arthritis   . COPD (chronic obstructive pulmonary disease) ([place])   . Pneumonia 04/2015    "bronchial" pneumonia   . Stroke [facility]) ? 05/2014    peripheral vision loss  . Sleep apnea     does use cpap  . Depression   . Anxiety     claustrophobia  . GERD (gastroesophageal reflux disease)   . Enlarged liver     20-25 years ago  . Retinal tear of left eye   . Glaucoma   . 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 Complication of anesthesia     difficult to wake up after neck surgery  . A-fib ([place])   . Anemia   . Atrial fibrillation, new onset ([place]) 10/03/2015    Past Surgical History  Procedure Laterality Date  . Shoulder surgery  09/24/2011  . Eye surgery  10/2007  . Neck surgery  06/29/2010  . Tonsillectomy      AT AGE 76  . Leg surgery      AT AGE 60  . Vasectomy    . Colonoscopy    . Cardiac catheterization  2003  . Lumbar fusion  09/27/2015    There were no vitals filed for this visit.  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 Reported low back soreness upon waking this morning but no pain upon entrance into clinic.   Limitations Sitting;Standing   How long can you sit comfortably? 10-15 minutes.   How long can you stand comfortably? 10 minutes.   Patient Stated Goals Get back to a normal life.   Currently in Pain? No/denies            [facility] PT Assessment - 11/23/15 0001    Assessment   Medical Diagnosis Radiculopathy of lumbar region.   Onset Date/Surgical Date 09/27/15   Next MD Visit 03/04/2016   Precautions   Precaution Comments No bending, twisting or extending.   Required Braces or Orthoses Spinal Brace   Restrictions   Weight Bearing Restrictions No                     [facility] Adult PT Treatment/Exercise - 11/23/15 0001    Lumbar Exercises: Stretches   Single Knee to Chest Stretch 3 reps;30 seconds;Other (comment)  BLE   Lumbar Exercises: Aerobic   Stationary Bike NuStep L5 x16 min   Lumbar Exercises: Supine  Clam 20 reps   Heel Slides 20 reps;Other (comment)  BLE; LLE hot sensation and pull in R low back with RLE   Straight Leg Raise 10 reps;Other (comment)  No pain with RLE, hot sensation beginning Lmid thigh to knee   Lumbar Exercises: Sidelying   Clam 20 reps;Other (comment)  BLE   Manual Therapy   Manual Therapy Myofascial release   Myofascial Release MFR to B low back paraspinals and QL to decrease tightness and discomfort                  PT Short Term Goals - 11/14/15 1900    PT SHORT TERM GOAL #1   Title Ind with an initial HEP.           PT Long Term Goals - 11/14/15 1904    PT LONG TERM GOAL #1   Title Patient sit 30 minutes with pain not > 3/10.   Time 8   Period Weeks   Status New   PT LONG TERM GOAL #2   Title Patient stand 15 minutes with pain not > 3/10.   Time 8   Period Weeks   Status New   PT LONG TERM GOAL #3   Title Perform ADL's with pain not > 3/10.   Time 8   Period  Weeks   Status New   PT LONG TERM GOAL #4   Title Eliminate LE pain.   Time 8   Period Weeks   Status New   PT LONG TERM GOAL #5   Title Walk a community distance wiht pain not > 3/10.               Plan - 11/23/15 1230    Clinical Impression Statement Patient tolerated today's treatment fairly well although transitional movements continue to be difficult for him. Patient competant with log rolling technique and utilizes technique with bed mobility during treatment. Continued to have discomfort in low back with intermittant exercises but had no pain with SKTC today. Tightness was noted in B lumbar paraspinals today during short session of manual therapy to relieve discomfort in low back. Goals remain on-going at this time secondary to pain experienced by patient and continued LE symptoms that were reported today from L mid thigh to knee area. Patient encounters more pain and sensation in RLE and low back now but pain was originally in LLE. Educated patient to avoid twisting and low back rotation to avoid injury and to bring HEP with him next treatment.   Pt will benefit from skilled therapeutic intervention in order to improve on the following deficits Pain;Decreased activity tolerance   Rehab Potential Good   PT Frequency 2x / week   PT Duration 8 weeks   PT Treatment/Interventions ADLs/Self Care Home Management;Electrical Stimulation;Moist Heat;Therapeutic exercise;Therapeutic activities;Patient/family education;Manual techniques   PT Next Visit Plan Continue with core strengthening while adhering to lumbar fusion precautions per MPT POC. Consider manual therapy to R low back next treatment.   Consulted and Agree with Plan of Care Patient        Problem List Patient Active Problem List   Diagnosis Date Noted  . Mitral valve regurgitation: mild to moderate 10/23/2015  . New onset atrial flutter ([place]) 10/04/2015  . Atrial fibrillation with rapid ventricular response ([place])   .  Anemia 10/03/2015  . Stroke ([place])   . Spondylolisthesis of lumbar region 09/27/2015  . Lobar pneumonia due to unspecified organism 05/25/2015  . Snoring 03/16/2015  . Witnessed  apneic spells 03/16/2015  . Obese 03/16/2015  . Morning headache 03/16/2015  . Excessive daytime sleepiness 03/16/2015  . COPD GOLD I 09/25/2014  . Upper airway cough syndrome vs cough variant asthma 08/22/2014  . Essential hypertension 08/22/2014  . Diplopia 06/25/2014  . Vision loss, bilateral 06/25/2014  . Weakness 06/25/2014  . Unspecified hereditary and idiopathic peripheral neuropathy 06/25/2014    Wynelle Fanny, PTA 11/23/2015, 12:37 PM  [facility] [place], [place], [address] Phone: [phone]   Fax:  [phone]  Name: KRISTOPER BOSHEARS MRN: [id number] Date of Birth: 06-13-1951

## 2015-11-28 ENCOUNTER — Ambulatory Visit: Payer: Medicare Other | Admitting: Physical Therapy

## 2015-11-28 DIAGNOSIS — R262 Difficulty in walking, not elsewhere classified: Secondary | ICD-10-CM | POA: Diagnosis not present

## 2015-11-28 DIAGNOSIS — M2560 Stiffness of unspecified joint, not elsewhere classified: Secondary | ICD-10-CM

## 2015-11-28 DIAGNOSIS — R5381 Other malaise: Secondary | ICD-10-CM

## 2015-11-28 DIAGNOSIS — M5416 Radiculopathy, lumbar region: Secondary | ICD-10-CM

## 2015-11-28 DIAGNOSIS — M5442 Lumbago with sciatica, left side: Secondary | ICD-10-CM

## 2015-11-28 NOTE — Therapy (Signed)
[facility] [place], [place], [address] Phone: [phone]   Fax:  [phone]  Physical Therapy Treatment  Patient Details  Name: Juan Johnson MRN: [id number] Date of Birth: [birth year] Referring Provider: Leeroy Cha MD  Encounter Date: 11/28/2015      PT End of Session - 11/28/15 0916    Visit Number 5   Number of Visits 16   Date for PT Re-Evaluation 01/16/16   PT Start Time 0901   PT Stop Time 0941   PT Time Calculation (min) 40 min   Activity Tolerance Patient tolerated treatment well;Patient limited by pain   Behavior During Therapy [facility] for tasks assessed/performed      Past Medical History  Diagnosis Date  . Hypertension   . Arthritis   . COPD (chronic obstructive pulmonary disease) ([place])   . Pneumonia 04/2015    "bronchial" pneumonia   . Stroke [facility]) ? 05/2014    peripheral vision loss  . Sleep apnea     does use cpap  . Depression   . Anxiety     claustrophobia  . GERD (gastroesophageal reflux disease)   . Enlarged liver     20-25 years ago  . Retinal tear of left eye   . Glaucoma   . 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 Complication of anesthesia     difficult to wake up after neck surgery  . A-fib ([place])   . Anemia   . Atrial fibrillation, new onset ([place]) 10/03/2015    Past Surgical History  Procedure Laterality Date  . Shoulder surgery  09/24/2011  . Eye surgery  10/2007  . Neck surgery  06/29/2010  . Tonsillectomy      AT AGE 65  . Leg surgery      AT AGE 65  . Vasectomy    . Colonoscopy    . Cardiac catheterization  2003  . Lumbar fusion  09/27/2015    There were no vitals filed for this visit.  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 States that he did exercises and the pain is better.   Limitations Sitting;Standing   How long can you sit comfortably? 10-15 minutes.   How long can you stand comfortably? 10 minutes.   Patient Stated Goals Get back to a normal life.   Currently in Pain? Other (Comment)  Had pain upon lifting LLE on NuStep but gave no prior or current pain rating upon that pain            [facility] PT Assessment - 11/28/15 0001    Assessment   Medical Diagnosis Radiculopathy of lumbar region.   Onset Date/Surgical Date 09/27/15   Next MD Visit 03/04/2016   Precautions   Precaution Comments No bending, twisting or extending.   Required Braces or Orthoses Spinal Brace   Restrictions   Weight Bearing Restrictions No                     [facility] Adult PT Treatment/Exercise - 11/28/15 0001    Lumbar Exercises: Aerobic   Stationary Bike  NuStep L5 x13 min   Lumbar Exercises: Standing   Row Strengthening;Both;Theraband  3x10 reps   Theraband Level (Row) Level 2 (Red)   Shoulder Extension Strengthening;Both;Theraband  3x10 reps   Theraband Level (Shoulder Extension) Level 2 (Red)   Lumbar Exercises: Seated   Long Arc Quad on Chair Strengthening;Both;3 sets;10 reps;Weights   LAQ on Chair Weights (lbs) 4   Lumbar Exercises: Supine   Heel Slides 20 reps;Other (comment)  R LB "felt it;" LLE hot sensation experienced   Bent Knee Raise Other (comment)  3x10 reps with light hot sensation   Straight Leg Raise 20 reps;Other (comment)  BLE with hot sensation lateral L thigh   Lumbar Exercises: Sidelying   Clam 20 reps;Other (comment)  BLE                PT Education - 11/28/15 0947    Education provided Yes   Education Details HH HEP- LAQ, QS, ankle pump, supine hip abduction; OP HEP added- ab set, supine marching, SL clamshell    Person(s) Educated Patient   Methods  Explanation;Demonstration;Tactile cues;Verbal cues;Handout   Comprehension Verbalized understanding;Returned demonstration;Verbal cues required          PT Short Term Goals - 11/14/15 1900    PT SHORT TERM GOAL #1   Title Ind with an initial HEP.           PT Long Term Goals - 11/14/15 1904    PT LONG TERM GOAL #1   Title Patient sit 30 minutes with pain not > 3/10.   Time 8   Period Weeks   Status New   PT LONG TERM GOAL #2   Title Patient stand 15 minutes with pain not > 3/10.   Time 8   Period Weeks   Status New   PT LONG TERM GOAL #3   Title Perform ADL's with pain not > 3/10.   Time 8   Period Weeks   Status New   PT LONG TERM GOAL #4   Title Eliminate LE pain.   Time 8   Period Weeks   Status New   PT LONG TERM GOAL #5   Title Walk a community distance wiht pain not > 3/10.               Plan - 11/28/15 0948    Clinical Impression Statement Patient tolerated today's treatment fairly well although he had an instance of very [place] R low back pain upon transition movement from sidelying to sitting and felt like stabbing pain per patient report. Completed all exercises with VCs for core activation. Patient tolerated all exercises well and only had intermittant hot sensations but had a increased stabbing pain in R low back upon rising from sidelying to sitting. All other transition movements had been completed with greater ease than had been observed in prior treatments. Patient presented in clinic with the [facility] HEP he was given upon their arrival following his surgery. It consisted of LAQ, heel slides, ankle pumps, supine hip abduction and QS. To avoid confusion for the patient exercises from the same [facility] handout were given to further HEP and they included supine marching, ab sets, sidelying clamshell. Patient noted upon end of treatment that he could still tell where he had had the stabbing R low back pain but gave no numerical pain rating.   Pt will benefit from  skilled therapeutic intervention in order to improve on the following deficits Pain;Decreased activity tolerance   Rehab Potential Good  PT Frequency 2x / week   PT Duration 8 weeks   PT Treatment/Interventions ADLs/Self Care Home Management;Electrical Stimulation;Moist Heat;Therapeutic exercise;Therapeutic activities;Patient/family education;Manual techniques   PT Next Visit Plan Continue with core strengthening while adhering to lumbar fusion precautions per MPT POC. Consider manual therapy to R low back next treatment.   PT Home Exercise Plan HH HEP- LAQ, ankle pumps, heel slide, QS, supine hip abduction. OP HEP addition- supine marching, ab set, SL clamshell   Consulted and Agree with Plan of Care Patient        Problem List Patient Active Problem List   Diagnosis Date Noted  . Mitral valve regurgitation: mild to moderate 10/23/2015  . New onset atrial flutter ([place]) 10/04/2015  . Atrial fibrillation with rapid ventricular response ([place])   . Anemia 10/03/2015  . Stroke ([place])   . Spondylolisthesis of lumbar region 09/27/2015  . Lobar pneumonia due to unspecified organism 05/25/2015  . Snoring 03/16/2015  . Witnessed apneic spells 03/16/2015  . Obese 03/16/2015  . Morning headache 03/16/2015  . Excessive daytime sleepiness 03/16/2015  . COPD GOLD I 09/25/2014  . Upper airway cough syndrome vs cough variant asthma 08/22/2014  . Essential hypertension 08/22/2014  . Diplopia 06/25/2014  . Vision loss, bilateral 06/25/2014  . Weakness 06/25/2014  . Unspecified hereditary and idiopathic peripheral neuropathy 06/25/2014    Wynelle Fanny, PTA 11/28/2015, 1:33 PM  [facility] [address] [place], [place], [address] Phone: [phone]   Fax:  [phone]  Name: KOTY MCLANAHAN MRN: [id number] Date of Birth: 1951/08/25

## 2015-11-30 ENCOUNTER — Encounter: Payer: Self-pay | Admitting: Physical Therapy

## 2015-11-30 ENCOUNTER — Ambulatory Visit: Payer: Medicare Other | Admitting: Physical Therapy

## 2015-11-30 DIAGNOSIS — M2560 Stiffness of unspecified joint, not elsewhere classified: Secondary | ICD-10-CM | POA: Diagnosis not present

## 2015-11-30 DIAGNOSIS — R5381 Other malaise: Secondary | ICD-10-CM

## 2015-11-30 DIAGNOSIS — M5442 Lumbago with sciatica, left side: Secondary | ICD-10-CM | POA: Diagnosis not present

## 2015-11-30 DIAGNOSIS — R262 Difficulty in walking, not elsewhere classified: Secondary | ICD-10-CM

## 2015-11-30 DIAGNOSIS — M5416 Radiculopathy, lumbar region: Secondary | ICD-10-CM | POA: Diagnosis not present

## 2015-11-30 NOTE — Therapy (Signed)
[facility] [place], [place], [address] Phone: [phone]   Fax:  [phone]  Physical Therapy Treatment  Patient Details  Name: Juan Johnson MRN: [id number] Date of Birth: 08-04-1951 Referring Provider: Leeroy Cha MD  Encounter Date: 11/30/2015      PT End of Session - 11/30/15 0913    Visit Number 6   Number of Visits 16   Date for PT Re-Evaluation 01/16/16   PT Start Time 0906   PT Stop Time 0958   PT Time Calculation (min) 52 min   Activity Tolerance Patient tolerated treatment well;Patient limited by pain   Behavior During Therapy [facility] for tasks assessed/performed      Past Medical History  Diagnosis Date  . Hypertension   . Arthritis   . COPD (chronic obstructive pulmonary disease) ([place])   . Pneumonia 04/2015    "bronchial" pneumonia   . Stroke [facility]) ? 05/2014    peripheral vision loss  . Sleep apnea     does use cpap  . Depression   . Anxiety     claustrophobia  . GERD (gastroesophageal reflux disease)   . Enlarged liver     20-25 years ago  . Retinal tear of left eye   . Glaucoma   . 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 Complication of anesthesia     difficult to wake up after neck surgery  . A-fib ([place])   . Anemia   . Atrial fibrillation, new onset ([place]) 10/03/2015    Past Surgical History  Procedure Laterality Date  . Shoulder surgery  09/24/2011  . Eye surgery  10/2007  . Neck surgery  06/29/2010  . Tonsillectomy      AT AGE 66  . Leg surgery      AT AGE 23  . Vasectomy    . Colonoscopy    . Cardiac catheterization  2003  . Lumbar fusion  09/27/2015    There were no vitals filed for this visit.  Visit Diagnosis:  Bilateral low back pain with left-sided sciatica  Debility  Chronic lumbar radiculopathy  Joint stiffness of spine  Difficulty in walking      Subjective Assessment - 11/30/15 0909    Subjective Reports that he has had another  instance of R low back stabbing pain at home. States that his LLE got hot this morning with exercises but quickly dissipated.   Limitations Sitting;Standing   How long can you sit comfortably? 10-15 minutes.   How long can you stand comfortably? 10 minutes.   Patient Stated Goals Get back to a normal life.   Currently in Pain? Yes  "a little tingle:   Pain Location Back   Pain Orientation Lower   Pain Descriptors / Indicators Tingling   Pain Type Surgical pain   Pain Onset More than a month ago            [facility] PT Assessment - 11/30/15 0001    Assessment   Medical Diagnosis Radiculopathy of lumbar region.   Onset Date/Surgical Date 09/27/15   Next MD Visit 03/04/2016   Precautions   Precaution Comments No bending, twisting or extending.   Required Braces or Orthoses Spinal Brace   Restrictions   Weight Bearing Restrictions No                     [facility] Adult PT Treatment/Exercise - 11/30/15 0001    Lumbar Exercises:  Aerobic   Stationary Bike NuStep L4 x15 min   Modalities   Modalities Electrical Stimulation;Moist Heat   Moist Heat Therapy   Number Minutes Moist Heat 15 Minutes   Moist Heat Location Lumbar Spine   Electrical Stimulation   Electrical Stimulation Location R low back   Electrical Stimulation Action Pre-Mod   Electrical Stimulation Parameters 80-150 Hz x15 min   Electrical Stimulation Goals Pain;Tone   Manual Therapy   Manual Therapy Myofascial release   Myofascial Release MFR/TPR to R lumbar paraspinals/ QL to decrease tightness and painin L sidelying                  PT Short Term Goals - 11/14/15 1900    PT SHORT TERM GOAL #1   Title Ind with an initial HEP.           PT Long Term Goals - 11/14/15 1904    PT LONG TERM GOAL #1   Title Patient sit 30 minutes with pain not > 3/10.   Time 8   Period Weeks   Status New   PT LONG TERM GOAL #2   Title Patient stand 15 minutes with pain not > 3/10.   Time 8   Period Weeks    Status New   PT LONG TERM GOAL #3   Title Perform ADL's with pain not > 3/10.   Time 8   Period Weeks   Status New   PT LONG TERM GOAL #4   Title Eliminate LE pain.   Time 8   Period Weeks   Status New   PT LONG TERM GOAL #5   Title Walk a community distance wiht pain not > 3/10.               Plan - 11/30/15 0951    Clinical Impression Statement Patient tolerated today's treatment fairly well although he intermittantly experienced R low back discomfort with transitional movements such as supine to sitting and sitting to supine. Patient presented in clinic with nodules of tightness in R lumbar paraspinals as well as QL region and responded well to moderate pressure with MFR and TPR. Good release noted following MFR/ TPR to R low back today. Normal modalities response noted following removal of the modalities to aid in relaxation of R low back musculature and decrease pain. Patient had hot sensation in R low back upon risiing following modalities that quickly dissipated per patient report. Patient also had instance of R low back catching with ambulation that only lasted a few seconds.   Pt will benefit from skilled therapeutic intervention in order to improve on the following deficits Pain;Decreased activity tolerance   Rehab Potential Good   PT Frequency 2x / week   PT Duration 8 weeks   PT Treatment/Interventions ADLs/Self Care Home Management;Electrical Stimulation;Moist Heat;Therapeutic exercise;Therapeutic activities;Patient/family education;Manual techniques   PT Next Visit Plan Continue with core strengthening while adhering to lumbar fusion precautions per MPT POC. Consider manual therapy again to R low back next treatment.   PT Home Exercise Plan HH HEP- LAQ, ankle pumps, heel slide, QS, supine hip abduction. OP HEP addition- supine marching, ab set, SL clamshell   Consulted and Agree with Plan of Care Patient        Problem List Patient Active Problem List    Diagnosis Date Noted  . Mitral valve regurgitation: mild to moderate 10/23/2015  . New onset atrial flutter ([place]) 10/04/2015  . Atrial fibrillation with rapid ventricular response ([place])   .  Anemia 10/03/2015  . Stroke ([place])   . Spondylolisthesis of lumbar region 09/27/2015  . Lobar pneumonia due to unspecified organism 05/25/2015  . Snoring 03/16/2015  . Witnessed apneic spells 03/16/2015  . Obese 03/16/2015  . Morning headache 03/16/2015  . Excessive daytime sleepiness 03/16/2015  . COPD GOLD I 09/25/2014  . Upper airway cough syndrome vs cough variant asthma 08/22/2014  . Essential hypertension 08/22/2014  . Diplopia 06/25/2014  . Vision loss, bilateral 06/25/2014  . Weakness 06/25/2014  . Unspecified hereditary and idiopathic peripheral neuropathy 06/25/2014    Wynelle Fanny, PTA 11/30/2015, 10:14 AM  [facility] [address] [place], [place], [address] Phone: [phone]   Fax:  [phone]  Name: Juan Johnson MRN: [id number] Date of Birth: 1951-06-16

## 2015-12-04 ENCOUNTER — Encounter: Payer: Self-pay | Admitting: Physical Therapy

## 2015-12-04 ENCOUNTER — Ambulatory Visit: Payer: Medicare Other | Admitting: Physical Therapy

## 2015-12-04 DIAGNOSIS — M5442 Lumbago with sciatica, left side: Secondary | ICD-10-CM | POA: Diagnosis not present

## 2015-12-04 DIAGNOSIS — M2560 Stiffness of unspecified joint, not elsewhere classified: Secondary | ICD-10-CM | POA: Diagnosis not present

## 2015-12-04 DIAGNOSIS — R262 Difficulty in walking, not elsewhere classified: Secondary | ICD-10-CM | POA: Diagnosis not present

## 2015-12-04 DIAGNOSIS — R5381 Other malaise: Secondary | ICD-10-CM

## 2015-12-04 DIAGNOSIS — M5416 Radiculopathy, lumbar region: Secondary | ICD-10-CM | POA: Diagnosis not present

## 2015-12-04 NOTE — Therapy (Signed)
[facility] [place], [place], [address] Phone: [phone]   Fax:  [phone]  Physical Therapy Treatment  Patient Details  Name: Juan Johnson MRN: [id number] Date of Birth: 10/18/1950 Referring Provider: Leeroy Cha MD  Encounter Date: 12/04/2015      PT End of Session - 12/04/15 0905    Visit Number 7   Number of Visits 16   Date for PT Re-Evaluation 01/16/16   PT Start Time 0903   PT Stop Time 0955   PT Time Calculation (min) 52 min   Activity Tolerance Patient tolerated treatment well   Behavior During Therapy [facility] for tasks assessed/performed      Past Medical History  Diagnosis Date  . Hypertension   . Arthritis   . COPD (chronic obstructive pulmonary disease) ([place])   . Pneumonia 04/2015    "bronchial" pneumonia   . Stroke [facility]) ? 05/2014    peripheral vision loss  . Sleep apnea     does use cpap  . Depression   . Anxiety     claustrophobia  . GERD (gastroesophageal reflux disease)   . Enlarged liver     20-25 years ago  . Retinal tear of left eye   . Glaucoma   . 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 Complication of anesthesia     difficult to wake up after neck surgery  . A-fib ([place])   . Anemia   . Atrial fibrillation, new onset ([place]) 10/03/2015    Past Surgical History  Procedure Laterality Date  . Shoulder surgery  09/24/2011  . Eye surgery  10/2007  . Neck surgery  06/29/2010  . Tonsillectomy      AT AGE 14  . Leg surgery      AT AGE 68  . Vasectomy    . Colonoscopy    . Cardiac catheterization  2003  . Lumbar fusion  09/27/2015    There were no vitals filed for this visit.  Visit Diagnosis:  Bilateral low back pain with left-sided sciatica  Debility      Subjective Assessment - 12/04/15 0911    Subjective States that back isn't hurting too bad today.   Limitations Sitting;Standing   How long can you sit comfortably? 10-15 minutes.   How long can  you stand comfortably? 10 minutes.   Patient Stated Goals Get back to a normal life.            [facility] PT Assessment - 12/04/15 0001    Assessment   Medical Diagnosis Radiculopathy of lumbar region.   Onset Date/Surgical Date 09/27/15   Next MD Visit 03/04/2016   Precautions   Precaution Comments No bending, twisting or extending.   Required Braces or Orthoses Spinal Brace   Restrictions   Weight Bearing Restrictions No                     [facility] Adult PT Treatment/Exercise - 12/04/15 0001    Lumbar Exercises: Aerobic   Stationary Bike NuStep L6 x15 min   Modalities   Modalities Electrical Stimulation;Moist Heat   Moist Heat Therapy   Number Minutes Moist Heat 15 Minutes   Moist Heat Location Lumbar Spine   Electrical Stimulation   Electrical Stimulation Location R low back   Electrical Stimulation Action Pre-Mod   Electrical Stimulation Parameters 80-150 Hz x15 min   Electrical Stimulation Goals Pain;Tone   Manual Therapy   Manual  Therapy Myofascial release   Myofascial Release MFR/TPR to R lumbar paraspinals/ QL to decrease tightness and painin L sidelying                  PT Short Term Goals - 12/04/15 0940    PT SHORT TERM GOAL #1   Title Ind with an initial HEP.           PT Long Term Goals - 11/14/15 1904    PT LONG TERM GOAL #1   Title Patient sit 30 minutes with pain not > 3/10.   Time 8   Period Weeks   Status New   PT LONG TERM GOAL #2   Title Patient stand 15 minutes with pain not > 3/10.   Time 8   Period Weeks   Status New   PT LONG TERM GOAL #3   Title Perform ADL's with pain not > 3/10.   Time 8   Period Weeks   Status New   PT LONG TERM GOAL #4   Title Eliminate LE pain.   Time 8   Period Weeks   Status New   PT LONG TERM GOAL #5   Title Walk a community distance wiht pain not > 3/10.               Plan - 12/04/15 0940    Clinical Impression Statement Patient tolerated today's treatment well today  with only one instance of R low back pain with sit to supine after modalities set up. Patient did not experience pain with sidelying to sitting from manual therapy as he has experienced before per patient report. Upper R lumbar paraspinals tightness and tightness in R QL noted with today's manual therapy. Normal modalities response noted following removal of the modalities.    Pt will benefit from skilled therapeutic intervention in order to improve on the following deficits Pain;Decreased activity tolerance   Rehab Potential Good   PT Frequency 2x / week   PT Duration 8 weeks   PT Treatment/Interventions ADLs/Self Care Home Management;Electrical Stimulation;Moist Heat;Therapeutic exercise;Therapeutic activities;Patient/family education;Manual techniques   PT Next Visit Plan Continue with core strengthening while adhering to lumbar fusion precautions per MPT POC. Consider manual therapy again to R low back next treatment.   PT Home Exercise Plan HH HEP- LAQ, ankle pumps, heel slide, QS, supine hip abduction. OP HEP addition- supine marching, ab set, SL clamshell   Consulted and Agree with Plan of Care Patient        Problem List Patient Active Problem List   Diagnosis Date Noted  . Mitral valve regurgitation: mild to moderate 10/23/2015  . New onset atrial flutter ([place]) 10/04/2015  . Atrial fibrillation with rapid ventricular response ([place])   . Anemia 10/03/2015  . Stroke ([place])   . Spondylolisthesis of lumbar region 09/27/2015  . Lobar pneumonia due to unspecified organism 05/25/2015  . Snoring 03/16/2015  . Witnessed apneic spells 03/16/2015  . Obese 03/16/2015  . Morning headache 03/16/2015  . Excessive daytime sleepiness 03/16/2015  . COPD GOLD I 09/25/2014  . Upper airway cough syndrome vs cough variant asthma 08/22/2014  . Essential hypertension 08/22/2014  . Diplopia 06/25/2014  . Vision loss, bilateral 06/25/2014  . Weakness 06/25/2014  . Unspecified hereditary and  idiopathic peripheral neuropathy 06/25/2014    Wynelle Fanny, PTA 12/04/2015, 10:31 AM  [facility] [place], [place], [address] Phone: [phone]   Fax:  [phone]  Name: RAWN LEVERETTE MRN:  [id number] Date of Birth: 1951/09/19

## 2015-12-05 ENCOUNTER — Encounter: Payer: Medicare Other | Admitting: Physical Therapy

## 2015-12-07 ENCOUNTER — Encounter: Payer: Self-pay | Admitting: Physical Therapy

## 2015-12-07 ENCOUNTER — Ambulatory Visit: Payer: Medicare Other | Admitting: Physical Therapy

## 2015-12-07 DIAGNOSIS — R5381 Other malaise: Secondary | ICD-10-CM | POA: Diagnosis not present

## 2015-12-07 DIAGNOSIS — M5442 Lumbago with sciatica, left side: Secondary | ICD-10-CM

## 2015-12-07 DIAGNOSIS — M5416 Radiculopathy, lumbar region: Secondary | ICD-10-CM | POA: Diagnosis not present

## 2015-12-07 DIAGNOSIS — M2560 Stiffness of unspecified joint, not elsewhere classified: Secondary | ICD-10-CM | POA: Diagnosis not present

## 2015-12-07 DIAGNOSIS — R262 Difficulty in walking, not elsewhere classified: Secondary | ICD-10-CM | POA: Diagnosis not present

## 2015-12-07 NOTE — Therapy (Signed)
[facility] [place], [place], [address] Phone: [phone]   Fax:  [phone]  Physical Therapy Treatment  Patient Details  Name: Juan Johnson MRN: [id number] Date of Birth: 03-28-1951 Referring Provider: Leeroy Cha MD  Encounter Date: 12/07/2015      PT End of Session - 12/07/15 0909    Visit Number 8   Number of Visits 16   Date for PT Re-Evaluation 01/16/16   PT Start Time 0902   PT Stop Time 0944   PT Time Calculation (min) 42 min   Activity Tolerance Patient tolerated treatment well   Behavior During Therapy [facility] for tasks assessed/performed      Past Medical History  Diagnosis Date  . Hypertension   . Arthritis   . COPD (chronic obstructive pulmonary disease) ([place])   . Pneumonia 04/2015    "bronchial" pneumonia   . Stroke [facility]) ? 05/2014    peripheral vision loss  . Sleep apnea     does use cpap  . Depression   . Anxiety     claustrophobia  . GERD (gastroesophageal reflux disease)   . Enlarged liver     20-25 years ago  . Retinal tear of left eye   . Glaucoma   . 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 Complication of anesthesia     difficult to wake up after neck surgery  . A-fib ([place])   . Anemia   . Atrial fibrillation, new onset ([place]) 10/03/2015    Past Surgical History  Procedure Laterality Date  . Shoulder surgery  09/24/2011  . Eye surgery  10/2007  . Neck surgery  06/29/2010  . Tonsillectomy      AT AGE 65  . Leg surgery      AT AGE 65  . Vasectomy    . Colonoscopy    . Cardiac catheterization  2003  . Lumbar fusion  09/27/2015    There were no vitals filed for this visit.  Visit Diagnosis:  Bilateral low back pain with left-sided sciatica  Debility      Subjective Assessment - 12/07/15 0909    Subjective Reports he had some pain with 10 reps of exercises this morning. Heard from someone that if you wear the brace all the time that muscles grow weak.  States that he has a little dull burn in LLE this morning.   Limitations Sitting;Standing   How long can you sit comfortably? 10-15 minutes.   How long can you stand comfortably? 10 minutes.   Patient Stated Goals Get back to a normal life.   Currently in Pain? Yes   Pain Location Leg   Pain Orientation Left   Pain Descriptors / Indicators Dull;Burning   Pain Type Surgical pain   Pain Onset More than a month ago            [facility] PT Assessment - 12/07/15 0001    Assessment   Medical Diagnosis Radiculopathy of lumbar region.   Onset Date/Surgical Date 09/27/15   Next MD Visit 03/04/2016   Precautions   Precaution Comments No bending, twisting or extending.   Required Braces or Orthoses Spinal Brace   Restrictions   Weight Bearing Restrictions No                     [facility] Adult PT Treatment/Exercise - 12/07/15 0001    Lumbar Exercises: Stretches   Single Knee to Chest Stretch  3 reps;30 seconds;Other (comment)  BLE   Lumbar Exercises: Aerobic   Stationary Bike NuStep L6 x12 min   Lumbar Exercises: Supine   Bent Knee Raise Other (comment)  x30 reps   Bridge Other (comment)  Attempted but RLE LBP 2-3/10 at lift   Straight Leg Raise 20 reps;Other (comment)  BLE; pain "running up down leg" regarding LLE   Lumbar Exercises: Sidelying   Clam 20 reps;Other (comment)  BLE   Manual Therapy   Manual Therapy Myofascial release   Myofascial Release MFR/TPR to R lumbar paraspinals/ QL/ upper Glute to decrease tightness and painin L sidelying                  PT Short Term Goals - 12/04/15 0940    PT SHORT TERM GOAL #1   Title Ind with an initial HEP.           PT Long Term Goals - 11/14/15 1904    PT LONG TERM GOAL #1   Title Patient sit 30 minutes with pain not > 3/10.   Time 8   Period Weeks   Status New   PT LONG TERM GOAL #2   Title Patient stand 15 minutes with pain not > 3/10.   Time 8   Period Weeks   Status New   PT LONG TERM GOAL #3    Title Perform ADL's with pain not > 3/10.   Time 8   Period Weeks   Status New   PT LONG TERM GOAL #4   Title Eliminate LE pain.   Time 8   Period Weeks   Status New   PT LONG TERM GOAL #5   Title Walk a community distance wiht pain not > 3/10.               Plan - 12/07/15 0947    Clinical Impression Statement Patient tolerated today's treatment fairly well as only exercise that was limited was bridging which is still painful per patient report. Experienced shooting sensation in LLE during SLR per patient report as well. Tightness particularly noted in R upper Glute region with only minimal tightness noted in R lumbar paraspinals/ QL musculature. Ambulated into clinic without AD or lumbar brace donned. Sidelying to sit had quick 3/10 R LBP but 4/10 LBP noted with sit to stand exercise today.   Pt will benefit from skilled therapeutic intervention in order to improve on the following deficits Pain;Decreased activity tolerance   Rehab Potential Good   PT Frequency 2x / week   PT Duration 8 weeks   PT Treatment/Interventions ADLs/Self Care Home Management;Electrical Stimulation;Moist Heat;Therapeutic exercise;Therapeutic activities;Patient/family education;Manual techniques   PT Next Visit Plan Continue with core strengthening while adhering to lumbar fusion precautions per MPT POC. Consider manual therapy again to R low back next treatment or positional traction.   PT Home Exercise Plan HH HEP- LAQ, ankle pumps, heel slide, QS, supine hip abduction. OP HEP addition- supine marching, ab set, SL clamshell   Consulted and Agree with Plan of Care Patient        Problem List Patient Active Problem List   Diagnosis Date Noted  . Mitral valve regurgitation: mild to moderate 10/23/2015  . New onset atrial flutter ([place]) 10/04/2015  . Atrial fibrillation with rapid ventricular response ([place])   . Anemia 10/03/2015  . Stroke ([place])   . Spondylolisthesis of lumbar region 09/27/2015   . Lobar pneumonia due to unspecified organism 05/25/2015  . Snoring 03/16/2015  .  Witnessed apneic spells 03/16/2015  . Obese 03/16/2015  . Morning headache 03/16/2015  . Excessive daytime sleepiness 03/16/2015  . COPD GOLD I 09/25/2014  . Upper airway cough syndrome vs cough variant asthma 08/22/2014  . Essential hypertension 08/22/2014  . Diplopia 06/25/2014  . Vision loss, bilateral 06/25/2014  . Weakness 06/25/2014  . Unspecified hereditary and idiopathic peripheral neuropathy 06/25/2014    Wynelle Fanny, PTA 12/07/2015, 9:55 AM  [facility] [address] [place], [place], [address] Phone: [phone]   Fax:  [phone]  Name: KIMMIE ANDO MRN: [id number] Date of Birth: November 03, 1950

## 2015-12-12 ENCOUNTER — Ambulatory Visit: Payer: Medicare Other | Admitting: Physical Therapy

## 2015-12-12 ENCOUNTER — Encounter: Payer: Self-pay | Admitting: Physical Therapy

## 2015-12-12 DIAGNOSIS — R5381 Other malaise: Secondary | ICD-10-CM

## 2015-12-12 DIAGNOSIS — M5442 Lumbago with sciatica, left side: Secondary | ICD-10-CM

## 2015-12-12 DIAGNOSIS — M2560 Stiffness of unspecified joint, not elsewhere classified: Secondary | ICD-10-CM | POA: Diagnosis not present

## 2015-12-12 DIAGNOSIS — R262 Difficulty in walking, not elsewhere classified: Secondary | ICD-10-CM | POA: Diagnosis not present

## 2015-12-12 DIAGNOSIS — M5416 Radiculopathy, lumbar region: Secondary | ICD-10-CM | POA: Diagnosis not present

## 2015-12-12 NOTE — Therapy (Signed)
[facility] [place], [place], [address] Phone: [phone]   Fax:  [phone]  Physical Therapy Treatment  Patient Details  Name: Juan Johnson MRN: [id number] Date of Birth: [birth year] Referring Provider: Leeroy Cha MD  Encounter Date: 12/12/2015      PT End of Session - 12/12/15 0859    Visit Number 9   Number of Visits 16   Date for PT Re-Evaluation 01/16/16   PT Start Time 0859   PT Stop Time 0945   PT Time Calculation (min) 46 min   Activity Tolerance Patient tolerated treatment well   Behavior During Therapy [facility] for tasks assessed/performed      Past Medical History  Diagnosis Date  . Hypertension   . Arthritis   . COPD (chronic obstructive pulmonary disease) ([place])   . Pneumonia 04/2015    "bronchial" pneumonia   . Stroke [facility]) ? 05/2014    peripheral vision loss  . Sleep apnea     does use cpap  . Depression   . Anxiety     claustrophobia  . GERD (gastroesophageal reflux disease)   . Enlarged liver     20-25 years ago  . Retinal tear of left eye   . Glaucoma   . 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 Complication of anesthesia     difficult to wake up after neck surgery  . A-fib ([place])   . Anemia   . Atrial fibrillation, new onset ([place]) 10/03/2015    Past Surgical History  Procedure Laterality Date  . Shoulder surgery  09/24/2011  . Eye surgery  10/2007  . Neck surgery  06/29/2010  . Tonsillectomy      AT AGE 35  . Leg surgery      AT AGE 7  . Vasectomy    . Colonoscopy    . Cardiac catheterization  2003  . Lumbar fusion  09/27/2015    There were no vitals filed for this visit.  Visit Diagnosis:  Bilateral low back pain with left-sided sciatica  Debility      Subjective Assessment - 12/12/15 0906    Subjective Reports that he began having RLE symptoms on beginning of NuStep but was busy yesterday with errands and slept great without pain.   Limitations  Sitting;Standing   How long can you sit comfortably? 10-15 minutes.   How long can you stand comfortably? 10 minutes.   Patient Stated Goals Get back to a normal life.   Currently in Pain? Yes   Pain Score 1    Pain Location Leg   Pain Orientation Right   Pain Descriptors / Indicators Dull   Pain Type Surgical pain   Pain Onset More than a month ago            [facility] PT Assessment - 12/12/15 0001    Assessment   Medical Diagnosis Radiculopathy of lumbar region.   Onset Date/Surgical Date 09/27/15   Next MD Visit 03/04/2016   Precautions   Precaution Comments No bending, twisting or extending.   Required Braces or Orthoses Spinal Brace   Restrictions   Weight Bearing Restrictions No                     [facility] Adult PT Treatment/Exercise - 12/12/15 0001    Lumbar Exercises: Stretches   Single Knee to Chest Stretch 3 reps;30 seconds  BLE   Lumbar Exercises: Aerobic  Stationary Bike NuStep L6 x12 min   Lumbar Exercises: Standing   Wall Slides Other (comment)  X30 reps   Row Strengthening;Both  x30 reps with Pink XTS   Shoulder Extension Strengthening;Both  3X10 reps with Pink XTS   Lumbar Exercises: Seated   Long Arc Quad on Chair Strengthening;Both;3 sets;10 reps;Weights   LAQ on Chair Weights (lbs) 4   Sit to Stand 20 reps;Other (comment)  Seated on Dynadisc without UE support   Lumbar Exercises: Supine   Bridge 10 reps  R LBP after 6 reps but pain dissipated quickly per pt report   Straight Leg Raise 20 reps  With LLE, discomfort in R LB but dissipated quickly   Lumbar Exercises: Sidelying   Clam Other (comment)  3x10 reps with red theraband BLE   Manual Therapy   Manual Therapy Myofascial release   Myofascial Release MFR/TPR to R lumbar paraspinals/ QL in L sidelying positional traction with small gray bolster to decrease tightness and painin L sidelying                  PT Short Term Goals - 12/04/15 0940    PT SHORT TERM GOAL #1    Title Ind with an initial HEP.           PT Long Term Goals - 11/14/15 1904    PT LONG TERM GOAL #1   Title Patient sit 30 minutes with pain not > 3/10.   Time 8   Period Weeks   Status New   PT LONG TERM GOAL #2   Title Patient stand 15 minutes with pain not > 3/10.   Time 8   Period Weeks   Status New   PT LONG TERM GOAL #3   Title Perform ADL's with pain not > 3/10.   Time 8   Period Weeks   Status New   PT LONG TERM GOAL #4   Title Eliminate LE pain.   Time 8   Period Weeks   Status New   PT LONG TERM GOAL #5   Title Walk a community distance wiht pain not > 3/10.               Plan - 12/12/15 0951    Clinical Impression Statement Patient tolerated today's treatment fairly well with no pain reported except for minimal pain with NuStep and supine exercises. Patient ambulated into therapy gym without brace donned and without AD. Completed all exercises well and only had minimal and short duration pain with bridges, SLR. Positional traction with small diameter bolster conducted today with MFR to R lumbar parapinals and QL as tightness detected there. Upon R sidelying to short sitting patient experienced increased R low back pain rated as 7/10 that following sitting for 1-2 minutes dissipated. Upon sit to stand following treatment patient experienced low back pain again but dissipated quickly after erecting stance. Upon erect stance patient denied low back pain.   Pt will benefit from skilled therapeutic intervention in order to improve on the following deficits Pain;Decreased activity tolerance   Rehab Potential Good   PT Frequency 2x / week   PT Duration 8 weeks   PT Treatment/Interventions ADLs/Self Care Home Management;Electrical Stimulation;Moist Heat;Therapeutic exercise;Therapeutic activities;Patient/family education;Manual techniques   PT Next Visit Plan Continue with core strengthening while adhering to lumbar fusion precautions per MPT POC. Consider manual  therapy again to R low back next treatment or positional traction. FOTO next treatment.   PT Home Exercise Plan HH HEP-  LAQ, ankle pumps, heel slide, QS, supine hip abduction. OP HEP addition- supine marching, ab set, SL clamshell   Consulted and Agree with Plan of Care Patient        Problem List Patient Active Problem List   Diagnosis Date Noted  . Mitral valve regurgitation: mild to moderate 10/23/2015  . New onset atrial flutter ([place]) 10/04/2015  . Atrial fibrillation with rapid ventricular response ([place])   . Anemia 10/03/2015  . Stroke ([place])   . Spondylolisthesis of lumbar region 09/27/2015  . Lobar pneumonia due to unspecified organism 05/25/2015  . Snoring 03/16/2015  . Witnessed apneic spells 03/16/2015  . Obese 03/16/2015  . Morning headache 03/16/2015  . Excessive daytime sleepiness 03/16/2015  . COPD GOLD I 09/25/2014  . Upper airway cough syndrome vs cough variant asthma 08/22/2014  . Essential hypertension 08/22/2014  . Diplopia 06/25/2014  . Vision loss, bilateral 06/25/2014  . Weakness 06/25/2014  . Unspecified hereditary and idiopathic peripheral neuropathy 06/25/2014    Juan Johnson, Juan Johnson 12/12/2015, 9:58 AM  [facility] [place], [place], [address] Phone: [phone]   Fax:  [phone]  Name: Juan Johnson MRN: [id number] Date of Birth: June 08, 1951

## 2015-12-14 ENCOUNTER — Ambulatory Visit: Payer: Medicare Other | Attending: Neurosurgery | Admitting: Physical Therapy

## 2015-12-14 ENCOUNTER — Encounter: Payer: Self-pay | Admitting: Physical Therapy

## 2015-12-14 DIAGNOSIS — M2560 Stiffness of unspecified joint, not elsewhere classified: Secondary | ICD-10-CM | POA: Insufficient documentation

## 2015-12-14 DIAGNOSIS — R5381 Other malaise: Secondary | ICD-10-CM | POA: Insufficient documentation

## 2015-12-14 DIAGNOSIS — M5416 Radiculopathy, lumbar region: Secondary | ICD-10-CM | POA: Diagnosis not present

## 2015-12-14 DIAGNOSIS — R262 Difficulty in walking, not elsewhere classified: Secondary | ICD-10-CM | POA: Insufficient documentation

## 2015-12-14 DIAGNOSIS — M5442 Lumbago with sciatica, left side: Secondary | ICD-10-CM | POA: Diagnosis not present

## 2015-12-14 NOTE — Therapy (Signed)
[facility] [place], [place], [address] Phone: [phone]   Fax:  [phone]  Physical Therapy Treatment  Patient Details  Name: Juan Johnson MRN: [id number] Date of Birth: 1951/04/06 Referring Provider: Leeroy Cha MD  Encounter Date: 12/14/2015      PT End of Session - 12/14/15 0908    Visit Number 10   Number of Visits 16   Date for PT Re-Evaluation 01/16/16   PT Start Time 0909   PT Stop Time 1002   PT Time Calculation (min) 53 min   Activity Tolerance Patient tolerated treatment well   Behavior During Therapy [facility] for tasks assessed/performed      Past Medical History  Diagnosis Date  . Hypertension   . Arthritis   . COPD (chronic obstructive pulmonary disease) ([place])   . Pneumonia 04/2015    "bronchial" pneumonia   . Stroke [facility]) ? 05/2014    peripheral vision loss  . Sleep apnea     does use cpap  . Depression   . Anxiety     claustrophobia  . GERD (gastroesophageal reflux disease)   . Enlarged liver     20-25 years ago  . Retinal tear of left eye   . Glaucoma   . 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 Complication of anesthesia     difficult to wake up after neck surgery  . A-fib ([place])   . Anemia   . Atrial fibrillation, new onset ([place]) 10/03/2015    Past Surgical History  Procedure Laterality Date  . Shoulder surgery  09/24/2011  . Eye surgery  10/2007  . Neck surgery  06/29/2010  . Tonsillectomy      AT AGE 22  . Leg surgery      AT AGE 27  . Vasectomy    . Colonoscopy    . Cardiac catheterization  2003  . Lumbar fusion  09/27/2015    There were no vitals filed for this visit.  Visit Diagnosis:  Bilateral low back pain with left-sided sciatica  Debility      Subjective Assessment - 12/14/15 0909    Subjective States that his low back has been aggravated recently and has been using cane.   Limitations Sitting;Standing   How long can you sit comfortably?  10-15 minutes.   How long can you stand comfortably? 10 minutes.   Patient Stated Goals Get back to a normal life.   Currently in Pain? Yes   Pain Location Leg   Pain Orientation Left   Pain Descriptors / Indicators Tingling   Pain Type Surgical pain   Pain Onset More than a month ago            [facility] PT Assessment - 12/14/15 0001    Assessment   Medical Diagnosis Radiculopathy of lumbar region.   Onset Date/Surgical Date 09/27/15   Next MD Visit 03/04/2016   Precautions   Precaution Comments No bending, twisting or extending.   Required Braces or Orthoses Spinal Brace   Restrictions   Weight Bearing Restrictions No                     [facility] Adult PT Treatment/Exercise - 12/14/15 0001    Lumbar Exercises: Aerobic   Stationary Bike NuStep L6 x12 min   Modalities   Modalities Electrical Stimulation;Moist Heat;Ultrasound   Moist Heat Therapy   Number Minutes Moist Heat 15 Minutes   Moist  Heat Location Lumbar Spine   Electrical Stimulation   Electrical Stimulation Location B low back   Electrical Stimulation Action IFC   Electrical Stimulation Parameters 80-150 Hz x15 min   Electrical Stimulation Goals Pain;Tone   Ultrasound   Ultrasound Location R lumbar musculature   Ultrasound Parameters 1.5 w/cm2, 100%,1 mhz x12 min   Ultrasound Goals Pain   Manual Therapy   Manual Therapy Myofascial release   Myofascial Release MFR/TPR to R lumbar paraspinals/ QL in L sidelying to decrease tightness and pain                   PT Short Term Goals - 12/04/15 0940    PT SHORT TERM GOAL #1   Title Ind with an initial HEP.           PT Long Term Goals - 11/14/15 1904    PT LONG TERM GOAL #1   Title Patient sit 30 minutes with pain not > 3/10.   Time 8   Period Weeks   Status New   PT LONG TERM GOAL #2   Title Patient stand 15 minutes with pain not > 3/10.   Time 8   Period Weeks   Status New   PT LONG TERM GOAL #3   Title Perform ADL's with pain  not > 3/10.   Time 8   Period Weeks   Status New   PT LONG TERM GOAL #4   Title Eliminate LE pain.   Time 8   Period Weeks   Status New   PT LONG TERM GOAL #5   Title Walk a community distance wiht pain not > 3/10.               Plan - 12/14/15 1001    Clinical Impression Statement Patient tolerated today's treatment well with no pain with transitional movements on plinth table. Ambulated into clinic with [facility] and lumbar brace donned due to aggravated low back since previous treatment. Normal modalites response noted following removal of the modalities. No pain reported or experienced by patient upon sidelying to sit position following [place] and only very minimal pain upon sidelying to sit following stimulation and heat. R lumbar musculature was noted with minimal tightness and slightly visible inflammation. Patient did experience R low back pain upon standing from sitting at end of treatment but again dissipated upon erect stance.   Pt will benefit from skilled therapeutic intervention in order to improve on the following deficits Pain;Decreased activity tolerance   Rehab Potential Good   PT Frequency 2x / week   PT Duration 8 weeks   PT Treatment/Interventions ADLs/Self Care Home Management;Electrical Stimulation;Moist Heat;Therapeutic exercise;Therapeutic activities;Patient/family education;Manual techniques   PT Next Visit Plan Continue with core strengthening while adhering to lumbar fusion precautions per MPT POC. Consider manual therapy and [place] to R low back as symptoms dictate.   PT Home Exercise Plan HH HEP- LAQ, ankle pumps, heel slide, QS, supine hip abduction. OP HEP addition- supine marching, ab set, SL clamshell   Consulted and Agree with Plan of Care Patient        Problem List Patient Active Problem List   Diagnosis Date Noted  . Mitral valve regurgitation: mild to moderate 10/23/2015  . New onset atrial flutter ([place]) 10/04/2015  . Atrial fibrillation with rapid  ventricular response ([place])   . Anemia 10/03/2015  . Stroke ([place])   . Spondylolisthesis of lumbar region 09/27/2015  . Lobar pneumonia due to unspecified organism 05/25/2015  .  Snoring 03/16/2015  . Witnessed apneic spells 03/16/2015  . Obese 03/16/2015  . Morning headache 03/16/2015  . Excessive daytime sleepiness 03/16/2015  . COPD GOLD I 09/25/2014  . Upper airway cough syndrome vs cough variant asthma 08/22/2014  . Essential hypertension 08/22/2014  . Diplopia 06/25/2014  . Vision loss, bilateral 06/25/2014  . Weakness 06/25/2014  . Unspecified hereditary and idiopathic peripheral neuropathy 06/25/2014   Ahmed Prima, PTA 12/14/2015 10:15 AM  [facility] [place], [place], [address] Phone: [phone]   Fax:  [phone]  Name: Juan Johnson MRN: [id number] Date of Birth: Jan 14, 1951

## 2015-12-19 ENCOUNTER — Ambulatory Visit: Payer: Medicare Other | Admitting: Physical Therapy

## 2015-12-19 ENCOUNTER — Encounter: Payer: Self-pay | Admitting: Physical Therapy

## 2015-12-19 DIAGNOSIS — R5381 Other malaise: Secondary | ICD-10-CM

## 2015-12-19 DIAGNOSIS — M2560 Stiffness of unspecified joint, not elsewhere classified: Secondary | ICD-10-CM | POA: Diagnosis not present

## 2015-12-19 DIAGNOSIS — M5442 Lumbago with sciatica, left side: Secondary | ICD-10-CM | POA: Diagnosis not present

## 2015-12-19 DIAGNOSIS — R262 Difficulty in walking, not elsewhere classified: Secondary | ICD-10-CM | POA: Diagnosis not present

## 2015-12-19 DIAGNOSIS — M5416 Radiculopathy, lumbar region: Secondary | ICD-10-CM | POA: Diagnosis not present

## 2015-12-19 NOTE — Therapy (Signed)
[facility] [place], [place], [address] Phone: [phone]   Fax:  [phone]  Physical Therapy Treatment  Patient Details  Name: Juan Johnson MRN: [id number] Date of Birth: 07-May-1951 Referring Provider: Leeroy Cha MD  Encounter Date: 12/19/2015      PT End of Session - 12/19/15 0904    Visit Number 11   Number of Visits 16   Date for PT Re-Evaluation 01/16/16   PT Start Time 0905   PT Stop Time 0945   PT Time Calculation (min) 40 min   Activity Tolerance Patient tolerated treatment well;Patient limited by pain   Behavior During Therapy [facility] for tasks assessed/performed      Past Medical History  Diagnosis Date  . Hypertension   . Arthritis   . COPD (chronic obstructive pulmonary disease) ([place])   . Pneumonia 04/2015    "bronchial" pneumonia   . Stroke [facility]) ? 05/2014    peripheral vision loss  . Sleep apnea     does use cpap  . Depression   . Anxiety     claustrophobia  . GERD (gastroesophageal reflux disease)   . Enlarged liver     20-25 years ago  . Retinal tear of left eye   . Glaucoma   . 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 Complication of anesthesia     difficult to wake up after neck surgery  . A-fib ([place])   . Anemia   . Atrial fibrillation, new onset ([place]) 10/03/2015    Past Surgical History  Procedure Laterality Date  . Shoulder surgery  09/24/2011  . Eye surgery  10/2007  . Neck surgery  06/29/2010  . Tonsillectomy      AT AGE 23  . Leg surgery      AT AGE 39  . Vasectomy    . Colonoscopy    . Cardiac catheterization  2003  . Lumbar fusion  09/27/2015    There were no vitals filed for this visit.  Visit Diagnosis:  Bilateral low back pain with left-sided sciatica  Debility  Chronic lumbar radiculopathy  Joint stiffness of spine  Difficulty in walking      Subjective Assessment - 12/19/15 0905    Subjective Upon waking patient states that pain  wasn't as bad as it has been. States that he has been trying to do a few things at home to help son who is sick. States that pain that rises and disappears in LLE is back again especially with sitting.   Limitations Sitting;Standing   How long can you sit comfortably? 10-15 minutes.   How long can you stand comfortably? 10 minutes.   Patient Stated Goals Get back to a normal life.   Currently in Pain? Other (Comment)  Intermittant pain per patient report with certain activities             [facility] PT Assessment - 12/19/15 0001    Assessment   Medical Diagnosis Radiculopathy of lumbar region.   Onset Date/Surgical Date 09/27/15   Hand Dominance Right   Next MD Visit 03/04/2016   Precautions   Precaution Comments No bending, twisting or extending.   Required Braces or Orthoses Spinal Brace   Restrictions   Weight Bearing Restrictions No                     [facility] Adult PT Treatment/Exercise - 12/19/15 0001    Lumbar Exercises: Aerobic  Stationary Bike NuStep L6 x15 min   Lumbar Exercises: Standing   Wall Slides 20 reps   Row Strengthening;Both  x30 reps Pink XTS   Shoulder Extension Strengthening;Both  x30 reps Pink XTS   Other Standing Lumbar Exercises B standing multifudus strengthening green theraband x20 reps each   Lumbar Exercises: Seated   Long Arc Quad on Chair Strengthening;Both;3 sets;10 reps;Weights   LAQ on Chair Weights (lbs) 4   Hip Flexion on Ball AROM;Both;20 reps   Hip Flexion on Ball Limitations Blue theraband   Lumbar Exercises: Supine   Bridge 10 reps;Other (comment)  Experienced low back pain upon lifting high   Lumbar Exercises: Sidelying   Clam 20 reps;Other (comment)  BLE                  PT Short Term Goals - 12/19/15 0908    PT SHORT TERM GOAL #1   Title Ind with an initial HEP.   Status Achieved           PT Long Term Goals - 11/14/15 1904    PT LONG TERM GOAL #1   Title Patient sit 30 minutes with pain not >  3/10.   Time 8   Period Weeks   Status New   PT LONG TERM GOAL #2   Title Patient stand 15 minutes with pain not > 3/10.   Time 8   Period Weeks   Status New   PT LONG TERM GOAL #3   Title Perform ADL's with pain not > 3/10.   Time 8   Period Weeks   Status New   PT LONG TERM GOAL #4   Title Eliminate LE pain.   Time 8   Period Weeks   Status New   PT LONG TERM GOAL #5   Title Walk a community distance wiht pain not > 3/10.               Plan - 12/19/15 0954    Clinical Impression Statement Patient tolerated today's treatment fairly well and focus was on strengthening to decrease pain with transitional movements. Ambulated into clinic today with brace donned and SPC in hand. With standing multifidus strengthening patient noted that he could feel it mostly in R low back. Upon attempted prone and quadruped position patient reported feeling a hot sensation down entire back and quickly moved into supine to diminish pain. Patient had low back pain as he lifted hips higher with bridge exercise today. Upon sidelying to sit and sit to stand patient experienced quick pain that dissipated. Upon erect stance with standing patient denied any pain. Patient was educated to try HEP at night as well as in the mornings to see if he notices a difference.   Pt will benefit from skilled therapeutic intervention in order to improve on the following deficits Pain;Decreased activity tolerance   Rehab Potential Good   PT Frequency 2x / week   PT Duration 8 weeks   PT Treatment/Interventions ADLs/Self Care Home Management;Electrical Stimulation;Moist Heat;Therapeutic exercise;Therapeutic activities;Patient/family education;Manual techniques   PT Next Visit Plan Continue with core strengthening while adhering to lumbar fusion precautions per MPT POC.    PT Home Exercise Plan HH HEP- LAQ, ankle pumps, heel slide, QS, supine hip abduction. OP HEP addition- supine marching, ab set, SL clamshell    Consulted and Agree with Plan of Care Patient        Problem List Patient Active Problem List   Diagnosis Date Noted  . Mitral  valve regurgitation: mild to moderate 10/23/2015  . New onset atrial flutter ([place]) 10/04/2015  . Atrial fibrillation with rapid ventricular response ([place])   . Anemia 10/03/2015  . Stroke ([place])   . Spondylolisthesis of lumbar region 09/27/2015  . Lobar pneumonia due to unspecified organism 05/25/2015  . Snoring 03/16/2015  . Witnessed apneic spells 03/16/2015  . Obese 03/16/2015  . Morning headache 03/16/2015  . Excessive daytime sleepiness 03/16/2015  . COPD GOLD I 09/25/2014  . Upper airway cough syndrome vs cough variant asthma 08/22/2014  . Essential hypertension 08/22/2014  . Diplopia 06/25/2014  . Vision loss, bilateral 06/25/2014  . Weakness 06/25/2014  . Unspecified hereditary and idiopathic peripheral neuropathy 06/25/2014    Wynelle Fanny, PTA 12/19/2015, 10:00 AM  [facility] [place], [place], [address] Phone: [phone]   Fax:  [phone]  Name: Juan Johnson MRN: [id number] Date of Birth: 11-28-1950

## 2015-12-21 ENCOUNTER — Ambulatory Visit: Payer: Medicare Other | Admitting: Physical Therapy

## 2015-12-21 ENCOUNTER — Encounter: Payer: Self-pay | Admitting: Physical Therapy

## 2015-12-21 DIAGNOSIS — M2560 Stiffness of unspecified joint, not elsewhere classified: Secondary | ICD-10-CM | POA: Diagnosis not present

## 2015-12-21 DIAGNOSIS — R5381 Other malaise: Secondary | ICD-10-CM | POA: Diagnosis not present

## 2015-12-21 DIAGNOSIS — M5442 Lumbago with sciatica, left side: Secondary | ICD-10-CM

## 2015-12-21 DIAGNOSIS — R262 Difficulty in walking, not elsewhere classified: Secondary | ICD-10-CM | POA: Diagnosis not present

## 2015-12-21 DIAGNOSIS — M5416 Radiculopathy, lumbar region: Secondary | ICD-10-CM | POA: Diagnosis not present

## 2015-12-21 NOTE — Therapy (Signed)
[facility] [place], [place], [address] Phone: [phone]   Fax:  [phone]  Physical Therapy Treatment  Patient Details  Name: Juan Johnson MRN: [id number] Date of Birth: [birth year] Referring Provider: Leeroy Cha MD  Encounter Date: 12/21/2015      PT End of Session - 12/21/15 0912    Visit Number 12   Number of Visits 16   Date for PT Re-Evaluation 01/16/16   PT Start Time 0902   PT Stop Time 0943   PT Time Calculation (min) 41 min   Activity Tolerance Patient tolerated treatment well   Behavior During Therapy [facility] for tasks assessed/performed      Past Medical History  Diagnosis Date  . Hypertension   . Arthritis   . COPD (chronic obstructive pulmonary disease) ([place])   . Pneumonia 04/2015    "bronchial" pneumonia   . Stroke [facility]) ? 05/2014    peripheral vision loss  . Sleep apnea     does use cpap  . Depression   . Anxiety     claustrophobia  . GERD (gastroesophageal reflux disease)   . Enlarged liver     20-25 years ago  . Retinal tear of left eye   . Glaucoma   . 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 Complication of anesthesia     difficult to wake up after neck surgery  . A-fib ([place])   . Anemia   . Atrial fibrillation, new onset ([place]) 10/03/2015    Past Surgical History  Procedure Laterality Date  . Shoulder surgery  09/24/2011  . Eye surgery  10/2007  . Neck surgery  06/29/2010  . Tonsillectomy      AT AGE 29  . Leg surgery      AT AGE 51  . Vasectomy    . Colonoscopy    . Cardiac catheterization  2003  . Lumbar fusion  09/27/2015    There were no vitals filed for this visit.  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 States he had once instance of quick LBP but none since. States that he thinks  the exercise may make it worse.   Limitations Sitting;Standing   How long can you sit comfortably? 10-15 minutes.   How long can you stand comfortably? 10 minutes.   Patient Stated Goals Get back to a normal life.   Currently in Pain? No/denies            [facility] PT Assessment - 12/21/15 0001    Assessment   Medical Diagnosis Radiculopathy of lumbar region.   Onset Date/Surgical Date 09/27/15   Hand Dominance Right   Next MD Visit 03/04/2016   Precautions   Precaution Comments No bending, twisting or extending.   Required Braces or Orthoses Spinal Brace   Restrictions   Weight Bearing Restrictions No                     [facility] Adult PT Treatment/Exercise - 12/21/15 0001    Lumbar Exercises: Aerobic   Stationary Bike NuStep L6 x15 min   Lumbar Exercises: Standing   Wall Slides 20 reps   Row Strengthening;Both  3x10 reps   Shoulder Extension Strengthening;Both  3x10 reps   Other Standing Lumbar  Exercises B standing multifudus strengthening green theraband x20 reps each, B standing hip extension x20 reps each   Other Standing Lumbar Exercises Seated hip flexion on airex x40 reps; B standing hip abduction x20 reps each   Lumbar Exercises: Seated   Long Arc Quad on Chair Strengthening;Both;3 sets;10 reps;Weights   LAQ on Chair Weights (lbs) 4   Lumbar Exercises: Supine   Other Supine Lumbar Exercises Seated lumbar extension with manual resistance x10 reps, with Pink XTS x20 reps                  PT Short Term Goals - 12/19/15 0908    PT SHORT TERM GOAL #1   Title Ind with an initial HEP.   Status Achieved           PT Long Term Goals - 11/14/15 1904    PT LONG TERM GOAL #1   Title Patient sit 30 minutes with pain not > 3/10.   Time 8   Period Weeks   Status New   PT LONG TERM GOAL #2   Title Patient stand 15 minutes with pain not > 3/10.   Time 8   Period Weeks   Status New   PT LONG TERM GOAL #3   Title Perform ADL's with pain not >  3/10.   Time 8   Period Weeks   Status New   PT LONG TERM GOAL #4   Title Eliminate LE pain.   Time 8   Period Weeks   Status New   PT LONG TERM GOAL #5   Title Walk a community distance wiht pain not > 3/10.               Plan - 12/21/15 0947    Clinical Impression Statement Patient tolerated today's treatment well with the new exercises with only light R LBP noted following today's treatment. Ambulated into clinic today with back brace donned without AD.  Demonstrated better from with multifidus strengthening exercise and did well with seated resisted lumbar extension. Prone and quadruped exercises were avoided today and hip extension and abduction exercises were completed in standing at countertop.   Pt will benefit from skilled therapeutic intervention in order to improve on the following deficits Pain;Decreased activity tolerance   Rehab Potential Good   PT Frequency 2x / week   PT Duration 8 weeks   PT Treatment/Interventions ADLs/Self Care Home Management;Electrical Stimulation;Moist Heat;Therapeutic exercise;Therapeutic activities;Patient/family education;Manual techniques   PT Next Visit Plan Continue with core strengthening while adhering to lumbar fusion precautions per MPT POC.    PT Home Exercise Plan HH HEP- LAQ, ankle pumps, heel slide, QS, supine hip abduction. OP HEP addition- supine marching, ab set, SL clamshell   Consulted and Agree with Plan of Care Patient        Problem List Patient Active Problem List   Diagnosis Date Noted  . Mitral valve regurgitation: mild to moderate 10/23/2015  . New onset atrial flutter ([place]) 10/04/2015  . Atrial fibrillation with rapid ventricular response ([place])   . Anemia 10/03/2015  . Stroke ([place])   . Spondylolisthesis of lumbar region 09/27/2015  . Lobar pneumonia due to unspecified organism 05/25/2015  . Snoring 03/16/2015  . Witnessed apneic spells 03/16/2015  . Obese 03/16/2015  . Morning headache 03/16/2015  .  Excessive daytime sleepiness 03/16/2015  . COPD GOLD I 09/25/2014  . Upper airway cough syndrome vs cough variant asthma 08/22/2014  . Essential hypertension 08/22/2014  . Diplopia 06/25/2014  .  Vision loss, bilateral 06/25/2014  . Weakness 06/25/2014  . Unspecified hereditary and idiopathic peripheral neuropathy 06/25/2014    Wynelle Fanny, PTA 12/21/2015, 10:00 AM  [facility] [place], [place], [address] Phone: [phone]   Fax:  [phone]  Name: Juan Johnson MRN: [id number] Date of Birth: 09-Mar-1951

## 2015-12-22 DIAGNOSIS — G4733 Obstructive sleep apnea (adult) (pediatric): Secondary | ICD-10-CM | POA: Diagnosis not present

## 2015-12-25 DIAGNOSIS — J449 Chronic obstructive pulmonary disease, unspecified: Secondary | ICD-10-CM | POA: Diagnosis not present

## 2015-12-25 DIAGNOSIS — G4733 Obstructive sleep apnea (adult) (pediatric): Secondary | ICD-10-CM | POA: Diagnosis not present

## 2015-12-26 ENCOUNTER — Encounter: Payer: Self-pay | Admitting: Physical Therapy

## 2015-12-26 ENCOUNTER — Ambulatory Visit: Payer: Medicare Other | Admitting: Physical Therapy

## 2015-12-26 DIAGNOSIS — R262 Difficulty in walking, not elsewhere classified: Secondary | ICD-10-CM | POA: Diagnosis not present

## 2015-12-26 DIAGNOSIS — M2560 Stiffness of unspecified joint, not elsewhere classified: Secondary | ICD-10-CM | POA: Diagnosis not present

## 2015-12-26 DIAGNOSIS — M5416 Radiculopathy, lumbar region: Secondary | ICD-10-CM | POA: Diagnosis not present

## 2015-12-26 DIAGNOSIS — R5381 Other malaise: Secondary | ICD-10-CM | POA: Diagnosis not present

## 2015-12-26 DIAGNOSIS — M5442 Lumbago with sciatica, left side: Secondary | ICD-10-CM | POA: Diagnosis not present

## 2015-12-26 NOTE — Therapy (Signed)
[facility] [place], [place], [address] Phone: [phone]   Fax:  [phone]  Physical Therapy Treatment  Patient Details  Name: Juan Johnson MRN: [id number] Date of Birth: Jul 18, 1951 Referring Provider: Leeroy Cha MD  Encounter Date: 12/26/2015      PT End of Session - 12/26/15 0846    Visit Number 13   Number of Visits 16   Date for PT Re-Evaluation 01/16/16   PT Start Time 0901   PT Stop Time 0942   PT Time Calculation (min) 41 min   Activity Tolerance Patient tolerated treatment well   Behavior During Therapy [facility] for tasks assessed/performed      Past Medical History  Diagnosis Date  . Hypertension   . Arthritis   . COPD (chronic obstructive pulmonary disease) ([place])   . Pneumonia 04/2015    "bronchial" pneumonia   . Stroke [facility]) ? 05/2014    peripheral vision loss  . Sleep apnea     does use cpap  . Depression   . Anxiety     claustrophobia  . GERD (gastroesophageal reflux disease)   . Enlarged liver     20-25 years ago  . Retinal tear of left eye   . Glaucoma   . 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 Complication of anesthesia     difficult to wake up after neck surgery  . A-fib ([place])   . Anemia   . Atrial fibrillation, new onset ([place]) 10/03/2015    Past Surgical History  Procedure Laterality Date  . Shoulder surgery  09/24/2011  . Eye surgery  10/2007  . Neck surgery  06/29/2010  . Tonsillectomy      AT AGE 62  . Leg surgery      AT AGE 67  . Vasectomy    . Colonoscopy    . Cardiac catheterization  2003  . Lumbar fusion  09/27/2015    There were no vitals filed for this visit.  Visit Diagnosis:  Bilateral low back pain with left-sided sciatica  Debility      Subjective Assessment - 12/26/15 0901    Subjective States that he has some back pain but OTC Tylenol are helping at this time. States that he had to feed this morning since his son is out of town  and has the R low back flared up.   Limitations Sitting;Standing   How long can you sit comfortably? 10-15 minutes.   How long can you stand comfortably? 10 minutes.   Patient Stated Goals Get back to a normal life.   Currently in Pain? Yes   Pain Score 2    Pain Location Back   Pain Orientation Right;Lower   Pain Descriptors / Indicators [place]   Pain Type Surgical pain   Pain Onset More than a month ago   Pain Frequency Intermittent            [facility] PT Assessment - 12/26/15 0001    Assessment   Medical Diagnosis Radiculopathy of lumbar region.   Onset Date/Surgical Date 09/27/15   Hand Dominance Right   Next MD Visit 03/04/2016   Precautions   Precaution Comments No bending, twisting or extending.   Required Braces or Orthoses Spinal Brace   Restrictions   Weight Bearing Restrictions No                     [facility] Adult PT Treatment/Exercise - 12/26/15  0001    Lumbar Exercises: Aerobic   Stationary Bike NuStep L6 x15 min   Lumbar Exercises: Standing   Wall Slides Other (comment)  x30 reps   Row Strengthening;Both  3x10 reps   Shoulder Extension Strengthening;Both  3x10 reps   Other Standing Lumbar Exercises B standing multifudus strengthening green theraband x20 reps each, wall pushups x20 reps   Other Standing Lumbar Exercises Seated clamshell with green theraband x30 reps, B standing leg extension x10 reps   Lumbar Exercises: Seated   Long Arc Quad on Chair Strengthening;Both;3 sets;10 reps;Weights  Weakness noted BLE   LAQ on Chair Weights (lbs) 4   Hip Flexion on Ball AROM;Both;Other (comment)  x30 reps each on airex; x30 reps each with 4#   Lumbar Exercises: Supine   Other Supine Lumbar Exercises Seated lumbar extension with Pink XTS x20 reps                  PT Short Term Goals - 12/19/15 0908    PT SHORT TERM GOAL #1   Title Ind with an initial HEP.   Status Achieved           PT Long Term Goals - 12/26/15 0913    PT LONG  TERM GOAL #1   Title Patient sit 30 minutes with pain not > 3/10.   Time 8   Period Weeks   Status Achieved   PT LONG TERM GOAL #2   Title Patient stand 15 minutes with pain not > 3/10.   Time 8   Period Weeks   Status Achieved   PT LONG TERM GOAL #3   Title Perform ADL's with pain not > 3/10.   Time 8   Period Weeks   Status Achieved   PT LONG TERM GOAL #4   Title Eliminate LE pain.   Time 8   Period Weeks   Status On-going   PT LONG TERM GOAL #5   Title Walk a community distance wiht pain not > 3/10.   Status Achieved               Plan - 12/26/15 0944    Clinical Impression Statement Patient tolerated today's treatment fairly well although his greatest limitation at ths time is standing and walking after sitting for a prolonged period of time. Patient requires walking a distance after sitting in order to decrease symptoms in low back and LEs after sitting. Patient has now achieved all LT goals set at evaluation except for elimination of LE symptoms goal. Patient noted weakness in BLE with seated hip flexion and LAQ today per patient report. Patient denied pain following today's treatment.   Pt will benefit from skilled therapeutic intervention in order to improve on the following deficits Pain;Decreased activity tolerance   Rehab Potential Good   PT Frequency 2x / week   PT Duration 8 weeks   PT Treatment/Interventions ADLs/Self Care Home Management;Electrical Stimulation;Moist Heat;Therapeutic exercise;Therapeutic activities;Patient/family education;Manual techniques   PT Next Visit Plan Continue with core strengthening while adhering to lumbar fusion precautions per MPT POC.    PT Home Exercise Plan HH HEP- LAQ, ankle pumps, heel slide, QS, supine hip abduction. OP HEP addition- supine marching, ab set, SL clamshell   Consulted and Agree with Plan of Care Patient        Problem List Patient Active Problem List   Diagnosis Date Noted  . Mitral valve  regurgitation: mild to moderate 10/23/2015  . New onset atrial flutter ([place]) 10/04/2015  .  Atrial fibrillation with rapid ventricular response ([place])   . Anemia 10/03/2015  . Stroke ([place])   . Spondylolisthesis of lumbar region 09/27/2015  . Lobar pneumonia due to unspecified organism 05/25/2015  . Snoring 03/16/2015  . Witnessed apneic spells 03/16/2015  . Obese 03/16/2015  . Morning headache 03/16/2015  . Excessive daytime sleepiness 03/16/2015  . COPD GOLD I 09/25/2014  . Upper airway cough syndrome vs cough variant asthma 08/22/2014  . Essential hypertension 08/22/2014  . Diplopia 06/25/2014  . Vision loss, bilateral 06/25/2014  . Weakness 06/25/2014  . Unspecified hereditary and idiopathic peripheral neuropathy 06/25/2014    Wynelle Fanny, PTA 12/26/2015, 10:33 AM  [facility] [place], [place], [address] Phone: [phone]   Fax:  [phone]  Name: BRYENT COLLMAN MRN: [id number] Date of Birth: Feb 13, 1951

## 2015-12-28 ENCOUNTER — Ambulatory Visit: Payer: Medicare Other | Admitting: Physical Therapy

## 2015-12-28 ENCOUNTER — Encounter: Payer: Self-pay | Admitting: Physical Therapy

## 2015-12-28 DIAGNOSIS — M5442 Lumbago with sciatica, left side: Secondary | ICD-10-CM | POA: Diagnosis not present

## 2015-12-28 DIAGNOSIS — R5381 Other malaise: Secondary | ICD-10-CM

## 2015-12-28 DIAGNOSIS — M2560 Stiffness of unspecified joint, not elsewhere classified: Secondary | ICD-10-CM | POA: Diagnosis not present

## 2015-12-28 DIAGNOSIS — M5416 Radiculopathy, lumbar region: Secondary | ICD-10-CM | POA: Diagnosis not present

## 2015-12-28 DIAGNOSIS — R262 Difficulty in walking, not elsewhere classified: Secondary | ICD-10-CM | POA: Diagnosis not present

## 2015-12-28 NOTE — Therapy (Signed)
[name], [place], [address] Phone: [phone]   Fax:  [phone]  Physical Therapy Treatment  Patient Details  Name: Juan Johnson MRN: [id number] Date of Birth: 1951-03-17 Referring Provider: Leeroy Cha MD  Encounter Date: 12/28/2015      PT End of Session - 12/28/15 0843    Visit Number 14   Number of Visits 16   Date for PT Re-Evaluation 01/16/16   PT Start Time 0901   PT Stop Time 0943   PT Time Calculation (min) 42 min   Activity Tolerance Patient tolerated treatment well   Behavior During Therapy [facility] for tasks assessed/performed      Past Medical History  Diagnosis Date  . Hypertension   . Arthritis   . COPD (chronic obstructive pulmonary disease) ([place])   . Pneumonia 04/2015    "bronchial" pneumonia   . Stroke [facility]) ? 05/2014    peripheral vision loss  . Sleep apnea     does use cpap  . Depression   . Anxiety     claustrophobia  . GERD (gastroesophageal reflux disease)   . Enlarged liver     20-25 years ago  . Retinal tear of left eye   . Glaucoma   . 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 Complication of anesthesia     difficult to wake up after neck surgery  . A-fib ([place])   . Anemia   . Atrial fibrillation, new onset ([place]) 10/03/2015    Past Surgical History  Procedure Laterality Date  . Shoulder surgery  09/24/2011  . Eye surgery  10/2007  . Neck surgery  06/29/2010  . Tonsillectomy      AT AGE 20  . Leg surgery      AT AGE 61  . Vasectomy    . Colonoscopy    . Cardiac catheterization  2003  . Lumbar fusion  09/27/2015    There were no vitals filed for this visit.  Visit Diagnosis:  Bilateral low back pain with left-sided sciatica  Debility      Subjective Assessment - 12/28/15 0843    Subjective States that he got up okay with no catching and had a little pain with getting clothes on. States that when he first walked on LLE he had  numbness.   Limitations Sitting;Standing   How long can you sit comfortably? 10-15 minutes.   How long can you stand comfortably? 10 minutes.   Patient Stated Goals Get back to a normal life.   Currently in Pain? No/denies            [facility] PT Assessment - 12/28/15 0001    Assessment   Medical Diagnosis Radiculopathy of lumbar region.   Onset Date/Surgical Date 09/27/15   Hand Dominance Right   Next MD Visit 03/04/2016   Precautions   Precaution Comments No bending, twisting or extending.   Required Braces or Orthoses Spinal Brace   Restrictions   Weight Bearing Restrictions No                     [facility] Adult PT Treatment/Exercise - 12/28/15 0001    Lumbar Exercises: Aerobic   Stationary Bike NuStep L6 x15 min   Lumbar Exercises: Standing   Wall Slides Other (comment)  2x15 reps   Row Strengthening;Both  3x10 reps with Pink XTS   Shoulder Extension Strengthening;Both  3x10 reps with Pink XTS  Other Standing Lumbar Exercises B standing multifudus strengthening green theraband x20 reps each, B forward and lateral step 6" x20 reps each   Other Standing Lumbar Exercises Seated clamshell with green theraband x30 reps, B bent over ball leg extension x20 reps each   Lumbar Exercises: Seated   Long Arc Quad on Chair Strengthening;Both;3 sets;10 reps;Weights   LAQ on Chair Weights (lbs) 4   Hip Flexion on [employer];Other (comment)  3x 10 reps with 4#                  PT Short Term Goals - 12/19/15 0908    PT SHORT TERM GOAL #1   Title Ind with an initial HEP.   Status Achieved           PT Long Term Goals - 12/26/15 0913    PT LONG TERM GOAL #1   Title Patient sit 30 minutes with pain not > 3/10.   Time 8   Period Weeks   Status Achieved   PT LONG TERM GOAL #2   Title Patient stand 15 minutes with pain not > 3/10.   Time 8   Period Weeks   Status Achieved   PT LONG TERM GOAL #3   Title Perform ADL's with pain not > 3/10.    Time 8   Period Weeks   Status Achieved   PT LONG TERM GOAL #4   Title Eliminate LE pain.   Time 8   Period Weeks   Status On-going   PT LONG TERM GOAL #5   Title Walk a community distance wiht pain not > 3/10.   Status Achieved               Plan - 12/28/15 0945    Clinical Impression Statement Patient tolerated today's treatment well today with 1-2 instances of stiffness upon standing between exercises. Patient had no reports of LBP or LE symptoms during today's treatment. Required minimal to moderate multimodal cueing for exercise techniques today. 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 All LT goals have been achieved at this time except for LE symptoms goal. Patient denied pain following today's treatment.   Pt will benefit from skilled therapeutic intervention in order to improve on the following deficits Pain;Decreased activity tolerance   Rehab Potential Good   PT Frequency 2x / week   PT Duration 8 weeks   PT Treatment/Interventions ADLs/Self Care Home Management;Electrical Stimulation;Moist Heat;Therapeutic exercise;Therapeutic activities;Patient/family education;Manual techniques   PT Next Visit Plan Continue with core strengthening while adhering to lumbar fusion precautions per MPT POC.    PT Home Exercise Plan HH HEP- LAQ, ankle pumps, heel slide, QS, supine hip abduction. OP HEP addition- supine marching, ab set, SL clamshell   Consulted and Agree with Plan of Care Patient        Problem List Patient Active Problem List   Diagnosis Date Noted  . Mitral valve regurgitation: mild to moderate 10/23/2015  . New onset atrial flutter ([place]) 10/04/2015  . Atrial fibrillation with rapid ventricular response ([place])   . Anemia 10/03/2015  . Stroke ([place])   . Spondylolisthesis of lumbar region 09/27/2015  . Lobar pneumonia due to unspecified organism  05/25/2015  . Snoring 03/16/2015  . Witnessed apneic spells 03/16/2015  . Obese 03/16/2015  . Morning headache 03/16/2015  . Excessive daytime sleepiness 03/16/2015  . COPD GOLD I  09/25/2014  . Upper airway cough syndrome vs cough variant asthma 08/22/2014  . Essential hypertension 08/22/2014  . Diplopia 06/25/2014  . Vision loss, bilateral 06/25/2014  . Weakness 06/25/2014  . Unspecified hereditary and idiopathic peripheral neuropathy 06/25/2014    Wynelle Fanny, PTA 12/28/2015, 9:53 AM  [facility] [address] [place], [place], [address] Phone: [phone]   Fax:  [phone]  Name: CALLAN REATEGUI MRN: [id number] Date of Birth: 08-03-1951

## 2016-01-02 ENCOUNTER — Ambulatory Visit: Payer: Medicare Other | Admitting: Physical Therapy

## 2016-01-02 ENCOUNTER — Encounter: Payer: Self-pay | Admitting: Physical Therapy

## 2016-01-02 DIAGNOSIS — M5442 Lumbago with sciatica, left side: Secondary | ICD-10-CM

## 2016-01-02 DIAGNOSIS — R5381 Other malaise: Secondary | ICD-10-CM

## 2016-01-02 DIAGNOSIS — M2560 Stiffness of unspecified joint, not elsewhere classified: Secondary | ICD-10-CM | POA: Diagnosis not present

## 2016-01-02 DIAGNOSIS — R262 Difficulty in walking, not elsewhere classified: Secondary | ICD-10-CM | POA: Diagnosis not present

## 2016-01-02 DIAGNOSIS — M5416 Radiculopathy, lumbar region: Secondary | ICD-10-CM | POA: Diagnosis not present

## 2016-01-02 NOTE — Therapy (Signed)
[facility] [place], [place], [address] Phone: [phone]   Fax:  [phone]  Physical Therapy Treatment  Patient Details  Name: Juan Johnson MRN: [id number] Date of Birth: [birth year] Referring Provider: Leeroy Cha MD  Encounter Date: 01/02/2016      PT End of Session - 01/02/16 0835    Visit Number 15   Number of Visits 16   Date for PT Re-Evaluation 01/16/16   PT Start Time 0902   PT Stop Time 0943   PT Time Calculation (min) 41 min   Activity Tolerance Patient tolerated treatment well   Behavior During Therapy [facility] for tasks assessed/performed      Past Medical History  Diagnosis Date  . Hypertension   . Arthritis   . COPD (chronic obstructive pulmonary disease) ([place])   . Pneumonia 04/2015    "bronchial" pneumonia   . Stroke [facility]) ? 05/2014    peripheral vision loss  . Sleep apnea     does use cpap  . Depression   . Anxiety     claustrophobia  . GERD (gastroesophageal reflux disease)   . Enlarged liver     20-25 years ago  . Retinal tear of left eye   . Glaucoma   . 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 Complication of anesthesia     difficult to wake up after neck surgery  . A-fib ([place])   . Anemia   . Atrial fibrillation, new onset ([place]) 10/03/2015    Past Surgical History  Procedure Laterality Date  . Shoulder surgery  09/24/2011  . Eye surgery  10/2007  . Neck surgery  06/29/2010  . Tonsillectomy      AT AGE 28  . Leg surgery      AT AGE 1  . Vasectomy    . Colonoscopy    . Cardiac catheterization  2003  . Lumbar fusion  09/27/2015    There were no vitals filed for this visit.  Visit Diagnosis:  Bilateral low back pain with left-sided sciatica  Debility      Subjective Assessment - 01/02/16 0912    Subjective "I can't complain." Only reports of pain today was getting out of bed this morning. 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 10-15 minutes.   How long can you stand comfortably? 10 minutes.   Patient Stated Goals Get back to a normal life.   Currently in Pain? No/denies            [facility] PT Assessment - 01/02/16 0001    Assessment   Medical Diagnosis Radiculopathy of lumbar region.   Onset Date/Surgical Date 09/27/15   Hand Dominance Right   Next MD Visit 03/04/2016   Precautions   Precaution Comments No bending, twisting or extending.   Required Braces or Orthoses Spinal Brace   Restrictions   Weight Bearing Restrictions No                     [facility] Adult PT Treatment/Exercise - 01/02/16 0001    Lumbar Exercises: Aerobic   Stationary Bike NuStep L5 x15 min   Lumbar Exercises: Standing   Wall Slides Other (comment)  3x10 reps   Row Strengthening;Both  3x10 reps   Row Limitations Pink XTS   Shoulder Extension Strengthening;Both  3x10 reps  Shoulder Extension Limitations Pink XTS   Other Standing Lumbar Exercises B standing multifudus strengthening green theraband x30 reps each   Other Standing Lumbar Exercises Seated clamshell with green theraband x30 reps, B bent over ball leg extension x20 reps each, B chop wood Pink XTS x30 reps each   Lumbar Exercises: Seated   Long Arc Quad on Chair Strengthening;Both;3 sets;10 reps;Weights   LAQ on Chair Weights (lbs) 4                  PT Short Term Goals - 12/19/15 0908    PT SHORT TERM GOAL #1   Title Ind with an initial HEP.   Status Achieved           PT Long Term Goals - 12/26/15 0913    PT LONG TERM GOAL #1   Title Patient sit 30 minutes with pain not > 3/10.   Time 8   Period Weeks   Status Achieved   PT LONG TERM GOAL #2   Title Patient stand 15 minutes with pain not > 3/10.   Time 8   Period Weeks   Status Achieved   PT LONG TERM GOAL #3   Title Perform ADL's with pain not > 3/10.   Time 8    Period Weeks   Status Achieved   PT LONG TERM GOAL #4   Title Eliminate LE pain.   Time 8   Period Weeks   Status On-going   PT LONG TERM GOAL #5   Title Walk a community distance wiht pain not > 3/10.   Status Achieved               Plan - 01/02/16 0953    Clinical Impression Statement Patient tolerated today's treatment well with greatest difficulty continuing to be standing after sitting for a prolonged time frame. Patient completed all exercises as directed with minimal to moderate multimodal cueing for exercise technique and corrections today. Patient's only report of discomfort being during L chop wood with Pink XTS today but report reported very little pain or discomfort. Only remaining goal that is on-going is LE symptoms which continue at this time. Patient experienced "a little" tingling down LLE today following treatment. Patient was again encouraged to continue adhering to no lifting until surgeon approves and adhering to restrictions.   Pt will benefit from skilled therapeutic intervention in order to improve on the following deficits Pain;Decreased activity tolerance   Rehab Potential Good   PT Frequency 2x / week   PT Duration 8 weeks   PT Treatment/Interventions ADLs/Self Care Home Management;Electrical Stimulation;Moist Heat;Therapeutic exercise;Therapeutic activities;Patient/family education;Manual techniques   PT Next Visit Plan Continue with core strengthening while adhering to lumbar fusion precautions per MPT POC.    PT Home Exercise Plan HH HEP- LAQ, ankle pumps, heel slide, QS, supine hip abduction. OP HEP addition- supine marching, ab set, SL clamshell   Consulted and Agree with Plan of Care Patient        Problem List Patient Active Problem List   Diagnosis Date Noted  . Mitral valve regurgitation: mild to moderate 10/23/2015  . New onset atrial flutter ([place]) 10/04/2015  . Atrial fibrillation with rapid ventricular response ([place])   . Anemia  10/03/2015  . Stroke ([place])   . Spondylolisthesis of lumbar region 09/27/2015  . Lobar pneumonia due to unspecified organism 05/25/2015  . Snoring 03/16/2015  . Witnessed apneic spells 03/16/2015  . Obese 03/16/2015  . Morning headache 03/16/2015  . Excessive  daytime sleepiness 03/16/2015  . COPD GOLD I 09/25/2014  . Upper airway cough syndrome vs cough variant asthma 08/22/2014  . Essential hypertension 08/22/2014  . Diplopia 06/25/2014  . Vision loss, bilateral 06/25/2014  . Weakness 06/25/2014  . Unspecified hereditary and idiopathic peripheral neuropathy 06/25/2014    Wynelle Fanny, PTA 01/02/2016, 9:59 AM  [facility] [address] [place], [place], [address] Phone: [phone]   Fax:  [phone]  Name: Juan Johnson MRN: [id number] Date of Birth: Jan 10, 1951

## 2016-01-04 ENCOUNTER — Ambulatory Visit: Payer: Medicare Other | Admitting: Physical Therapy

## 2016-01-04 ENCOUNTER — Encounter: Payer: Self-pay | Admitting: Physical Therapy

## 2016-01-04 DIAGNOSIS — M5442 Lumbago with sciatica, left side: Secondary | ICD-10-CM | POA: Diagnosis not present

## 2016-01-04 DIAGNOSIS — M2560 Stiffness of unspecified joint, not elsewhere classified: Secondary | ICD-10-CM | POA: Diagnosis not present

## 2016-01-04 DIAGNOSIS — M5416 Radiculopathy, lumbar region: Secondary | ICD-10-CM | POA: Diagnosis not present

## 2016-01-04 DIAGNOSIS — R5381 Other malaise: Secondary | ICD-10-CM | POA: Diagnosis not present

## 2016-01-04 DIAGNOSIS — R262 Difficulty in walking, not elsewhere classified: Secondary | ICD-10-CM | POA: Diagnosis not present

## 2016-01-04 NOTE — Therapy (Addendum)
[facility] [place], [place], [address] Phone: [phone]   Fax:  [phone]  Physical Therapy Treatment  Patient Details  Name: Juan Johnson MRN: [id number] Date of Birth: Sep 11, 1951 Referring Provider: Leeroy Cha MD  Encounter Date: 01/04/2016      PT End of Session - 01/04/16 0843    Visit Number 16   Number of Visits 16   Date for PT Re-Evaluation 01/16/16   PT Start Time 0859   PT Stop Time 0942   PT Time Calculation (min) 43 min   Activity Tolerance Patient tolerated treatment well   Behavior During Therapy [facility] for tasks assessed/performed      Past Medical History  Diagnosis Date  . Hypertension   . Arthritis   . COPD (chronic obstructive pulmonary disease) ([place])   . Pneumonia 04/2015    "bronchial" pneumonia   . Stroke [facility]) ? 05/2014    peripheral vision loss  . Sleep apnea     does use cpap  . Depression   . Anxiety     claustrophobia  . GERD (gastroesophageal reflux disease)   . Enlarged liver     20-25 years ago  . Retinal tear of left eye   . Glaucoma   . 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 Complication of anesthesia     difficult to wake up after neck surgery  . A-fib ([place])   . Anemia   . Atrial fibrillation, new onset ([place]) 10/03/2015    Past Surgical History  Procedure Laterality Date  . Shoulder surgery  09/24/2011  . Eye surgery  10/2007  . Neck surgery  06/29/2010  . Tonsillectomy      AT AGE 66  . Leg surgery      AT AGE 81  . Vasectomy    . Colonoscopy    . Cardiac catheterization  2003  . Lumbar fusion  09/27/2015    There were no vitals filed for this visit.  Visit Diagnosis:  Bilateral low back pain with left-sided sciatica  Debility      Subjective Assessment - 01/04/16 0944    Subjective States that he was mowing his yard and forgot about a hole and the wheel caught the hole and jostled him some and felt some low back discomfort  afterwards. States that he had his brace donned at the time.   Limitations Sitting;Standing   How long can you sit comfortably? 10-15 minutes.   How long can you stand comfortably? 10 minutes.   Patient Stated Goals Get back to a normal life.   Currently in Pain? Yes  Gave no numerical pain rating   Pain Location Back   Pain Orientation Lower   Pain Descriptors / Indicators Sore   Pain Type Surgical pain   Pain Onset More than a month ago            [facility] PT Assessment - 01/04/16 0001    Assessment   Medical Diagnosis Radiculopathy of lumbar region.   Onset Date/Surgical Date 09/27/15   Hand Dominance Right   Next MD Visit 03/04/2016   Precautions   Precaution Comments No bending, twisting or extending.   Required Braces or Orthoses Spinal Brace   Restrictions   Weight Bearing Restrictions No                     [facility] Adult PT Treatment/Exercise - 01/04/16 0001  Lumbar Exercises: Aerobic   Stationary Bike NuStep L5 x15 min   Lumbar Exercises: Standing   Wall Slides Other (comment)  x30 reps   Row Strengthening;Both  3x10 reps   Row Limitations Pink XTS   Shoulder Extension Strengthening;Both  3x10 reps   Shoulder Extension Limitations Pink XTS   Other Standing Lumbar Exercises B standing multifudus strengthening green theraband x30 reps each, seated resisted lumbar extension Pink XTS x30 reps   Other Standing Lumbar Exercises Seated clamshell with green theraband x30 reps, B bent over ball leg extension x20 reps each, resisted trunk flexion Pink XTS 3x10 reps, B forward step 6" x30 reps each   Lumbar Exercises: Seated   Long Arc Quad on Chair Strengthening;Both;3 sets;10 reps;Weights   LAQ on Chair Weights (lbs) 4   Hip Flexion on [employer];Other (comment)  x30 reps with 4#   Lumbar Exercises: Supine   Other Supine Lumbar Exercises Seated clamshell green theraband x30 reps                   PT Short Term Goals - 12/19/15  0908    PT SHORT TERM GOAL #1   Title Ind with an initial HEP.   Status Achieved           PT Long Term Goals - 01/04/16 0945    PT LONG TERM GOAL #1   Title Patient sit 30 minutes with pain not > 3/10.   Time 8   Period Weeks   Status Achieved   PT LONG TERM GOAL #2   Title Patient stand 15 minutes with pain not > 3/10.   Time 8   Period Weeks   Status Achieved   PT LONG TERM GOAL #3   Title Perform ADL's with pain not > 3/10.   Time 8   Period Weeks   Status Achieved   PT LONG TERM GOAL #4   Title Eliminate LE pain.   Time 8   Period Weeks   Status Not Met   PT LONG TERM GOAL #5   Title Walk a community distance wiht pain not > 3/10.   Status Achieved               Plan - 01/04/16 0946    Clinical Impression Statement Patient has tolerated treatment well today following lumbar spine surgery. Patient only had soreness with today's treatment today due to mowing this week but states he had his brace donned. Patient completes all exercises with minimal to moderate multimodal cueing for exercise technique and corrections. Patient now able to lay prone per patient report. Has achieved all goals set at evaluation except for LE symptoms goal at this time. Patient was educated to be mindful of precautions and to continue HEPs given previously.    Pt will benefit from skilled therapeutic intervention in order to improve on the following deficits Pain;Decreased activity tolerance   Rehab Potential Good   PT Frequency 2x / week   PT Duration 8 weeks   PT Treatment/Interventions ADLs/Self Care Home Management;Electrical Stimulation;Moist Heat;Therapeutic exercise;Therapeutic activities;Patient/family education;Manual techniques   PT Next Visit Plan Communicate need for D/C summary to MPT.   PT Home Exercise Plan HH HEP- LAQ, ankle pumps, heel slide, QS, supine hip abduction. OP HEP addition- supine marching, ab set, SL clamshell   Consulted and Agree with Plan of Care  Patient        Problem List Patient Active Problem List   Diagnosis Date Noted  . Mitral  valve regurgitation: mild to moderate 10/23/2015  . New onset atrial flutter ([place]) 10/04/2015  . Atrial fibrillation with rapid ventricular response ([place])   . Anemia 10/03/2015  . Stroke ([place])   . Spondylolisthesis of lumbar region 09/27/2015  . Lobar pneumonia due to unspecified organism 05/25/2015  . Snoring 03/16/2015  . Witnessed apneic spells 03/16/2015  . Obese 03/16/2015  . Morning headache 03/16/2015  . Excessive daytime sleepiness 03/16/2015  . COPD GOLD I 09/25/2014  . Upper airway cough syndrome vs cough variant asthma 08/22/2014  . Essential hypertension 08/22/2014  . Diplopia 06/25/2014  . Vision loss, bilateral 06/25/2014  . Weakness 06/25/2014  . Unspecified hereditary and idiopathic peripheral neuropathy 06/25/2014    Ahmed Prima, PTA 01/04/2016 9:58 AM  [facility] [address] [place], [place], [address] Phone: [phone]   Fax:  [phone]  Name: Juan Johnson MRN: [id number] Date of Birth: Jun 26, 1951    PHYSICAL THERAPY DISCHARGE SUMMARY  Visits from Start of Care: 16.  Current functional level related to goals / functional outcomes: Please see above.   Remaining deficits: Patient responded very well to treatment though he continued to have LE symptoms/pain.   Education / Equipment: HEP. Plan: Patient agrees to discharge.  Patient goals were partially met. Patient is being discharged due to being pleased with the current functional level.  ?????  Mali Applegate MPT

## 2016-01-11 ENCOUNTER — Encounter: Payer: Self-pay | Admitting: Cardiology

## 2016-01-11 ENCOUNTER — Ambulatory Visit (INDEPENDENT_AMBULATORY_CARE_PROVIDER_SITE_OTHER): Payer: Medicare Other | Admitting: Cardiology

## 2016-01-11 VITALS — BP 142/66 | HR 60 | Ht 69.0 in | Wt 232.6 lb

## 2016-01-11 DIAGNOSIS — I340 Nonrheumatic mitral (valve) insufficiency: Secondary | ICD-10-CM | POA: Diagnosis not present

## 2016-01-11 DIAGNOSIS — I483 Typical atrial flutter: Secondary | ICD-10-CM

## 2016-01-11 DIAGNOSIS — I10 Essential (primary) hypertension: Secondary | ICD-10-CM

## 2016-01-11 DIAGNOSIS — I4892 Unspecified atrial flutter: Secondary | ICD-10-CM | POA: Insufficient documentation

## 2016-01-11 MED ORDER — DILTIAZEM HCL ER COATED BEADS 240 MG PO CP24: 240.0000 mg | ORAL_CAPSULE | Freq: Every day | ORAL | Status: DC

## 2016-01-11 MED ORDER — METOPROLOL TARTRATE 25 MG PO TABS: 25.0000 mg | ORAL_TABLET | Freq: Two times a day (BID) | ORAL | Status: DC

## 2016-01-11 MED ORDER — APIXABAN 5 MG PO TABS: 5.0000 mg | ORAL_TABLET | Freq: Two times a day (BID) | ORAL | Status: DC

## 2016-01-11 NOTE — Progress Notes (Signed)
Patient ID: TAOS LANNEN, male   DOB: 08-02-1951, 65 y.o.   MRN: [id number]    Date:  01/11/2016   ID:  IKTAN BOYCE, DOB 12-25-1950, MRN [id number]  PCP:  Curly Rim, MD  Primary Cardiologist:  Matelyn Antonelli [place] MD  Chief Complaint  Patient presents with  . Follow-up     History of Present Illness: Juan Johnson is a 65 y.o. male with a history of A-Fib, HTN, COPD, Stroke, OSA, anxiety, depression, glaucoma, anemia. He  apparently hjad a heart cath > 10 years ago which revealed no CAD. He presented 10/03/15 with Atrial flutter following back surgery. PT identified an elevated HR 150's and recommended he go to the ED.  CHADSVASC 4.  Eliquis wsa started. He had an echocardiogram 10/05/15 revealing and EF of 55-60%, normal wall motion, LA was normal in size, mild to moderate MR.  He was started on Cardizem 90 mg PO TID and metoprolol 25bid.  He converted spontaneously.   On follow up today he is doing well. He denies any chest pain, SOB, palpitations. He does feel a little fatigued. Recently completed PT for back.   Wt Readings from Last 3 Encounters:  01/11/16 105.507 kg (232 lb 9.6 oz)  10/23/15 97.07 kg (214 lb)  10/06/15 98.612 kg (217 lb 6.4 oz)     Past Medical History  Diagnosis Date  . Hypertension   . Arthritis   . COPD (chronic obstructive pulmonary disease) ([place])   . Pneumonia 04/2015    "bronchial" pneumonia   . Stroke [facility]) ? 05/2014    peripheral vision loss  . Sleep apnea     does use cpap  . Depression   . Anxiety     claustrophobia  . GERD (gastroesophageal reflux disease)   . Enlarged liver     20-25 years ago  . Retinal tear of left eye   . Glaucoma   . 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 Complication of anesthesia     difficult to wake up after neck surgery  . A-fib ([place])   . Anemia   . Atrial fibrillation, new onset ([place]) 10/03/2015    Current Outpatient Prescriptions  Medication Sig Dispense Refill  . acetaminophen  (TYLENOL) 500 MG tablet Take 500 mg by mouth every 4 (four) hours as needed for mild pain or moderate pain (Takes 2 every 4- 6 hours).     Marland Kitchen apixaban (ELIQUIS) 5 MG TABS tablet Take 1 tablet (5 mg total) by mouth 2 (two) times daily. 180 tablet 3  . Cetirizine HCl (ZYRTEC PO) Take 10 mg by mouth daily.     Marland Kitchen dextromethorphan-guaiFENesin (MUCINEX DM) 30-600 MG 12hr tablet Take 1 tablet by mouth 2 (two) times daily as needed for cough.     . diltiazem (CARDIZEM CD) 240 MG 24 hr capsule Take 1 capsule (240 mg total) by mouth daily. 90 capsule 3  . ezetimibe (ZETIA) 10 MG tablet Take 10 mg by mouth daily.    . Fluticasone-Salmeterol (ADVAIR) 100-50 MCG/DOSE AEPB Inhale 1 puff into the lungs 2 (two) times daily.    Marland Kitchen ketorolac (ACULAR) 0.5 % ophthalmic solution Place 1 drop into the left eye 4 (four) times daily.     Marland Kitchen latanoprost (XALATAN) 0.005 % ophthalmic solution Place 1 drop into both eyes at bedtime.     . metoprolol tartrate (LOPRESSOR) 25 MG tablet Take 1 tablet (25 mg total) by mouth 2 (two) times  daily. 180 tablet 3  . montelukast (SINGULAIR) 10 MG tablet Take 10 mg by mouth at bedtime.     . Multiple Vitamins-Minerals (MULTIVITAMIN WITH MINERALS) tablet Take 1 tablet by mouth daily.    . niacin (NIASPAN) 1000 MG CR tablet Take 1,000 mg by mouth daily.    Marland Kitchen omeprazole (PRILOSEC) 20 MG capsule Take 20 mg by mouth 2 (two) times daily before a meal.     . oxyCODONE (ROXICODONE) 15 MG immediate release tablet Take 1 tablet (15 mg total) by mouth every 4 (four) hours as needed for severe pain. 60 tablet 0   No current facility-administered medications for this visit.    Allergies:    Allergies  Allergen Reactions  . Crestor [Rosuvastatin] Other (See Comments)    Cramps and muscle weakness  . Simvastatin Other (See Comments)    Cramps and muscle weakness    Social History:  The patient  reports that he quit smoking about 18 years ago. His smoking use included Cigarettes. He has a 69  pack-year smoking history. He has never used smokeless tobacco. He reports that he does not drink alcohol or use illicit drugs.   Family history:   Family History  Problem Relation Age of Onset  . Diabetes    . Hypothyroidism    . Hypertension Mother   . Cancer    . Diabetes type II Brother   . Diabetes type II Brother   . Hyperthyroidism    . Cataracts    . Glaucoma    . Macular degeneration Mother   . Stroke Father   . Emphysema Father     smoked    ROS:  Please see the history of present illness.  All other systems reviewed and negative.   PHYSICAL EXAM: VS:  BP 142/66 mmHg  Pulse 60  Ht 5\' 9"  (1.753 m)  Wt 105.507 kg (232 lb 9.6 oz)  BMI 34.33 kg/m2 Well nourished, well developed, in no acute distress HEENT: Pupils are equal round react to light accommodation extraocular movements are intact.  Neck: no JVDNo cervical lymphadenopathy. Cardiac: Regular rate and rhythm without murmurs rubs or gallops. Lungs:  clear to auscultation bilaterally, no wheezing, rhonchi or rales Ext: no lower extremity edema.  2+ radial and dorsalis pedis pulses. Skin: warm and dry Neuro:  Grossly normal    ASSESSMENT AND PLAN:  1. Atrial flutter note post op back surgery. Asymptomatic. Would continue diltiazem and metoprolol for rate control and BP control. Continue Eliquis. Patient to monitor HR with periodic BP readings. Follow up in 6 months.  2. HTN currently controlled. Prior to Aflutter he was on Diovan HCT. Now on diltiazem and metoprolol. Will continue.   3. MR. Mild to moderate by Echo. Will plan to repeat in one year.

## 2016-01-11 NOTE — Patient Instructions (Signed)
Continue your current therapy  I will see you in 6 months.   

## 2016-01-15 DIAGNOSIS — H401132 Primary open-angle glaucoma, bilateral, moderate stage: Secondary | ICD-10-CM | POA: Diagnosis not present

## 2016-01-29 ENCOUNTER — Telehealth: Payer: Self-pay | Admitting: Cardiology

## 2016-01-29 NOTE — Telephone Encounter (Signed)
New message      Pt c/o BP issue: STAT if pt c/o blurred vision, one-sided weakness or slurred speech  1. What are your last 5 BP readings? 01-22-16 @11  180/68; 4-10@ 12:30 148/60; 4-10@ 4pm 155/52; 01-23-16@7 :30pm 158/55; 01-24-16@6 :30pm 153/65; 01-24-16@6 :50pm 151/63; 01-27-16@9 :06 am 181/70, 01-27-16@ 9:10am 175/73; 01-27-16@9 :13am 171/71; 01-27-16@ 9:30am 157/64; 01-27-16 @1 :30pm 171/71; 01-28-16@ 9:48am  168/73; 01-28-16@7 :00pm 150/64 2. Are you having any other symptoms (ex. Dizziness, headache, blurred vision, passed out)? Headaches in am 3. What is your BP issue?  Calling to give bp readings

## 2016-01-29 NOTE — Telephone Encounter (Signed)
Would recommend he restart the valsartan/hctz (what strength) and come for Pharmacy OV in 2-3 weeks

## 2016-01-29 NOTE — Telephone Encounter (Addendum)
Pt confirmed 160-12.5mg  strength for valsartan-HCTZ.  Advised to resume this medication but to monitor for any abrupt BP swings and/or other symptoms. Informed him scheduler will contact for pharmacy OV & aware to keep BP log in interim. Pt aware to call if refills needed.

## 2016-01-29 NOTE — Telephone Encounter (Signed)
Returned call. Confirmed meds as listed.  BPs as recorded below. Wife notes he'd been on valsartan-HCTZ and had been instructed to discontinue this some time ago, but does note his BPs ran better w/ this med.  Aware I will route for recommendations.

## 2016-01-30 ENCOUNTER — Telehealth: Payer: Self-pay | Admitting: Pharmacist Clinician (PhC)/ Clinical Pharmacy Specialist

## 2016-01-30 ENCOUNTER — Telehealth: Payer: Self-pay | Admitting: Cardiology

## 2016-01-30 MED ORDER — DILTIAZEM HCL ER COATED BEADS 240 MG PO CP24: 240.0000 mg | ORAL_CAPSULE | Freq: Every day | ORAL | Status: DC

## 2016-01-30 MED ORDER — METOPROLOL TARTRATE 25 MG PO TABS: 25.0000 mg | ORAL_TABLET | Freq: Two times a day (BID) | ORAL | Status: DC

## 2016-01-30 NOTE — Telephone Encounter (Signed)
New message       *STAT* If patient is at the pharmacy, call can be transferred to refill team.   1. Which medications need to be refilled? (please list name of each medication and dose if known) cardizem 240mg , metoprolol 25mg  2. Which pharmacy/location (including street and city if local pharmacy) is medication to be sent to? [facility] in [place] 3. Do they need a 30 day or 90 day supply? 90 day

## 2016-01-30 NOTE — Telephone Encounter (Signed)
Refills submitted.  

## 2016-01-30 NOTE — Telephone Encounter (Signed)
Closed encounter °

## 2016-02-06 ENCOUNTER — Ambulatory Visit: Payer: Medicare Other | Admitting: Neurology

## 2016-02-14 ENCOUNTER — Telehealth: Payer: Self-pay | Admitting: Neurology

## 2016-02-14 ENCOUNTER — Ambulatory Visit (INDEPENDENT_AMBULATORY_CARE_PROVIDER_SITE_OTHER): Payer: Medicare Other | Admitting: Neurology

## 2016-02-14 ENCOUNTER — Encounter: Payer: Self-pay | Admitting: Neurology

## 2016-02-14 VITALS — BP 128/66 | HR 46 | Ht 69.0 in | Wt 226.0 lb

## 2016-02-14 DIAGNOSIS — R413 Other amnesia: Secondary | ICD-10-CM

## 2016-02-14 DIAGNOSIS — R251 Tremor, unspecified: Secondary | ICD-10-CM

## 2016-02-14 DIAGNOSIS — W19XXXA Unspecified fall, initial encounter: Secondary | ICD-10-CM

## 2016-02-14 DIAGNOSIS — G629 Polyneuropathy, unspecified: Secondary | ICD-10-CM | POA: Diagnosis not present

## 2016-02-14 DIAGNOSIS — R410 Disorientation, unspecified: Secondary | ICD-10-CM

## 2016-02-14 DIAGNOSIS — F05 Delirium due to known physiological condition: Secondary | ICD-10-CM | POA: Diagnosis not present

## 2016-02-14 NOTE — Patient Instructions (Signed)
Remember to drink plenty of fluid, eat healthy meals and do not skip any meals. Try to eat protein with a every meal and eat a healthy snack such as fruit or nuts in between meals. Try to keep a regular sleep-wake schedule and try to exercise daily, particularly in the form of walking, 20-30 minutes a day, if you can.   As far as diagnostic testing: MRI of the brain, neurocognitive testing, lab  I would like to see you back in 3 months, sooner if we need to. Please call us with any interim questions, concerns, problems, updates or refill requests.   Our phone number is [phone]. We also have an after hours call service for urgent matters and there is a physician on-call for urgent questions. For any emergencies you know to call 911 or go to the nearest emergency room

## 2016-02-14 NOTE — Telephone Encounter (Signed)
Noted, thank you

## 2016-02-14 NOTE — Telephone Encounter (Signed)
FYI- When patient stopped to check-out I tried to make the 3 month f/u as instructed on the AVS but pt's spouse stated that Dr. Jaynee Eagles would be calling to make the appt after testing is complete.
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
## 2016-02-15 ENCOUNTER — Encounter: Payer: Self-pay | Admitting: Pharmacist Clinician (PhC)/ Clinical Pharmacy Specialist

## 2016-02-15 ENCOUNTER — Telehealth: Payer: Self-pay | Admitting: *Deleted

## 2016-02-15 ENCOUNTER — Ambulatory Visit (INDEPENDENT_AMBULATORY_CARE_PROVIDER_SITE_OTHER): Payer: Medicare Other | Admitting: Pharmacist Clinician (PhC)/ Clinical Pharmacy Specialist

## 2016-02-15 ENCOUNTER — Telehealth: Payer: Self-pay | Admitting: Neurology

## 2016-02-15 ENCOUNTER — Other Ambulatory Visit: Payer: Self-pay | Admitting: Neurology

## 2016-02-15 VITALS — BP 122/60 | HR 48 | Ht 69.0 in | Wt 227.6 lb

## 2016-02-15 DIAGNOSIS — I10 Essential (primary) hypertension: Secondary | ICD-10-CM | POA: Diagnosis not present

## 2016-02-15 LAB — SJOGREN'S SYNDROME ANTIBODS(SSA + SSB)
ENA SSA (RO) Ab: <0.2 AI (ref 0.0–0.9)
ENA SSB (LA) Ab: <0.2 AI (ref 0.0–0.9)

## 2016-02-15 MED ORDER — METOPROLOL TARTRATE 25 MG PO TABS: 12.5000 mg | ORAL_TABLET | Freq: Two times a day (BID) | ORAL | Status: DC

## 2016-02-15 MED ORDER — ALPRAZOLAM 0.5 MG PO TABS: ORAL_TABLET | ORAL | Status: DC

## 2016-02-15 NOTE — Telephone Encounter (Signed)
Called pt and spoke to him about normal labs per Dr Jaynee Eagles. He verbalized understanding.  Also advised we sent rx xanax to [facility], [place] - [address] as requested for upcoming MRI. He verbalized understanding. He stated he was scheduled for 02/20/16 for MRI.

## 2016-02-15 NOTE — Telephone Encounter (Signed)
Faxed printed/signed rx xanax per pt request to pharmacy listed below. Fax: [phone]. Received confirmation.

## 2016-02-15 NOTE — Telephone Encounter (Signed)
Pt request medication for MRI to be sent to [facility] in [place]. Please call pt

## 2016-02-15 NOTE — Telephone Encounter (Signed)
-----   Message from Melvenia Beam, MD sent at 02/15/2016  1:10 PM EDT ----- Labs normal

## 2016-02-15 NOTE — Progress Notes (Signed)
02/15/2016 Juan Johnson 1951/02/04 [id number]   HPI:  Juan Johnson is a 65 y.o. male patient of Dr Juan Johnson, with a [place] below who presents today for hypertension clinic evaluation.  He was hospitalized in December and on discharge was told to d/c his valsartan hctz.  There was a misunderstanding and he continued to take until he saw Dr. Martinique on March 30 and realized the error.  He stopped it at that time, but noticed an increase in his BP about 1-2 weeks later and called office for advice.  He restarted it and has been back on it for about 2 weeks now.    Cardiac Hx: AF (CHADS-VASC = 4, on Eliquis), HTN, CVA, OSA (on CPAP) ,   Family Hx: mother died from CHF at 45, father from MI/CVA at 61; siblings and children all without known CVD  Social Hx: quit smoking 30+ years ago; drinks only the occasional beer or caffeine  Diet: mostly eats at home, does like salt and adds to many foods  Exercise: none, has lower back issues  Home BP readings:  Varied in 140-160 range until about 1 week after re-starting valsartan, since then all WNL systolic and diastolic  Current antihypertensive medications: valsartan hctz 160/25; metoprolol tart 25 mg bid, diltiazem 240 mg qd  Intolerances: ACEI caused cough  Wt Readings from Last 3 Encounters:  02/15/16 227 lb 9.6 oz (103.239 kg)  02/14/16 226 lb (102.513 kg)  01/11/16 232 lb 9.6 oz (105.507 kg)   BP Readings from Last 3 Encounters:  02/15/16 122/60  02/14/16 128/66  01/11/16 142/66   Pulse Readings from Last 3 Encounters:  02/15/16 48  02/14/16 46  01/11/16 60    Current Outpatient Prescriptions  Medication Sig Dispense Refill  . acetaminophen (TYLENOL) 500 MG tablet Take 500 mg by mouth every 4 (four) hours as needed for mild pain or moderate pain (Takes 2 every 4- 6 hours).     Marland Kitchen apixaban (ELIQUIS) 5 MG TABS tablet Take 1 tablet (5 mg total) by mouth 2 (two) times daily. 180 tablet 3  . BREO ELLIPTA 100-25 MCG/INH AEPB Take 1  puff by mouth daily.    . Cetirizine HCl (ZYRTEC PO) Take 10 mg by mouth daily.     Marland Kitchen dextromethorphan-guaiFENesin (MUCINEX DM) 30-600 MG 12hr tablet Take 1 tablet by mouth 2 (two) times daily as needed for cough.     . diltiazem (CARDIZEM CD) 240 MG 24 hr capsule Take 1 capsule (240 mg total) by mouth daily. 90 capsule 3  . ezetimibe (ZETIA) 10 MG tablet Take 10 mg by mouth daily.    Marland Kitchen ketorolac (ACULAR) 0.5 % ophthalmic solution Place 1 drop into the left eye 4 (four) times daily.     Marland Kitchen latanoprost (XALATAN) 0.005 % ophthalmic solution Place 1 drop into both eyes at bedtime.     . metoprolol tartrate (LOPRESSOR) 25 MG tablet Take 1 tablet (25 mg total) by mouth 2 (two) times daily. 180 tablet 3  . montelukast (SINGULAIR) 10 MG tablet Take 10 mg by mouth at bedtime.     . Multiple Vitamins-Minerals (MULTIVITAMIN WITH MINERALS) tablet Take 1 tablet by mouth daily.    . niacin (NIASPAN) 1000 MG CR tablet Take 1,000 mg by mouth daily.    Marland Kitchen omeprazole (PRILOSEC) 20 MG capsule Take 20 mg by mouth 2 (two) times daily before a meal.     . valsartan-hydrochlorothiazide (DIOVAN-HCT) 160-12.5 MG tablet Take 1 tablet by  mouth daily.     No current facility-administered medications for this visit.    Allergies  Allergen Reactions  . Crestor [Rosuvastatin] Other (See Comments)    Cramps and muscle weakness  . Simvastatin Other (See Comments)    Cramps and muscle weakness    Past Medical History  Diagnosis Date  . Hypertension   . Arthritis   . COPD (chronic obstructive pulmonary disease) ([place])   . Pneumonia 04/2015    "bronchial" pneumonia   . Stroke [facility]) ? 05/2014    peripheral vision loss  . Sleep apnea     does use cpap  . Depression   . Anxiety     claustrophobia  . GERD (gastroesophageal reflux disease)   . Enlarged liver     20-25 years ago  . Retinal tear of left eye   . Glaucoma   . 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 Complication of anesthesia      difficult to wake up after neck surgery  . A-fib ([place])   . Anemia   . Atrial fibrillation, new onset ([place]) 10/03/2015    Blood pressure 122/60, pulse 48, height 5\' 9"  (1.753 m), weight 227 lb 9.6 oz (103.239 kg).    Juan Johnson PharmD CPP [facility]

## 2016-02-15 NOTE — Assessment & Plan Note (Signed)
Today his BP continues to be well controlled.  Of note his heart rate is only 48 in the office today and according to his home readings, runs usually 50-14.  I will have him cut the metoprolol from 25 mg bid to 12.5 mg bid.  He is to continue with home BP and HR checks and call if HR drops into the 40's again.  Otherwise we can see him again as needed

## 2016-02-15 NOTE — Patient Instructions (Signed)
   Your blood pressure today is  122/60 (goal is <150/90)  Check your blood pressure at home daily and keep record of the readings.  (Omron is a good brand of BP cuff)  Take your BP meds as follows: cut metoprolol to 1/2 tablet (12.5 mg) twice daily; all other medications unchanged  Bring all of your meds, your BP cuff and your record of home blood pressures to your next appointment.  Exercise as you're able, try to walk approximately 30 minutes per day.  Keep salt intake to a minimum, especially watch canned and prepared boxed foods.  Eat more fresh fruits and vegetables and fewer canned items.  Avoid eating in fast food restaurants.    HOW TO TAKE YOUR BLOOD PRESSURE: . Rest 5 minutes before taking your blood pressure. .  Don't smoke or drink caffeinated beverages for at least 30 minutes before. . Take your blood pressure before (not after) you eat. . Sit comfortably with your back supported and both feet on the floor (don't cross your legs). . Elevate your arm to heart level on a table or a desk. . Use the proper sized cuff. It should fit smoothly and snugly around your bare upper arm. There should be enough room to slip a fingertip under the cuff. The bottom edge of the cuff should be 1 inch above the crease of the elbow. . Ideally, take 3 measurements at one sitting and record the average.

## 2016-02-15 NOTE — Telephone Encounter (Signed)
Dr Jaynee Eagles- are you okay with this? Please advise.

## 2016-02-20 ENCOUNTER — Ambulatory Visit (INDEPENDENT_AMBULATORY_CARE_PROVIDER_SITE_OTHER): Payer: Self-pay

## 2016-02-20 DIAGNOSIS — Z0289 Encounter for other administrative examinations: Secondary | ICD-10-CM

## 2016-02-20 DIAGNOSIS — F05 Delirium due to known physiological condition: Secondary | ICD-10-CM

## 2016-02-20 DIAGNOSIS — R251 Tremor, unspecified: Secondary | ICD-10-CM

## 2016-02-20 DIAGNOSIS — G609 Hereditary and idiopathic neuropathy, unspecified: Secondary | ICD-10-CM | POA: Diagnosis not present

## 2016-02-20 DIAGNOSIS — R413 Other amnesia: Secondary | ICD-10-CM

## 2016-02-20 DIAGNOSIS — R410 Disorientation, unspecified: Secondary | ICD-10-CM

## 2016-02-20 DIAGNOSIS — G629 Polyneuropathy, unspecified: Secondary | ICD-10-CM

## 2016-02-20 DIAGNOSIS — W19XXXA Unspecified fall, initial encounter: Secondary | ICD-10-CM

## 2016-02-21 ENCOUNTER — Telehealth: Payer: Self-pay | Admitting: *Deleted

## 2016-02-21 NOTE — Telephone Encounter (Signed)
Awesome thanks!

## 2016-02-21 NOTE — Telephone Encounter (Signed)
-----   Message from Melvenia Beam, MD sent at 02/21/2016  1:14 PM EDT ----- MRi of the brain has not changes since last time in October 2016. Stable, good news, no new strokes or other.

## 2016-02-21 NOTE — Telephone Encounter (Signed)
Dr Jaynee Eagles- FYI  Called and spoke to pt and daughter in law about MRI results brain per Dr Jaynee Eagles note. They both verbalized understanding and stated he has appt for neurocognitive testing tomorrow morning at 8am at [facility]. Advised I will let Dr Jaynee Eagles know.

## 2016-02-22 ENCOUNTER — Encounter: Payer: Self-pay | Admitting: *Deleted

## 2016-02-22 DIAGNOSIS — R413 Other amnesia: Secondary | ICD-10-CM | POA: Diagnosis not present

## 2016-03-04 DIAGNOSIS — M5416 Radiculopathy, lumbar region: Secondary | ICD-10-CM | POA: Diagnosis not present

## 2016-03-04 DIAGNOSIS — Z6834 Body mass index (BMI) 34.0-34.9, adult: Secondary | ICD-10-CM | POA: Diagnosis not present

## 2016-03-04 DIAGNOSIS — I10 Essential (primary) hypertension: Secondary | ICD-10-CM | POA: Diagnosis not present

## 2016-03-08 ENCOUNTER — Telehealth: Payer: Self-pay | Admitting: *Deleted

## 2016-03-08 NOTE — Telephone Encounter (Signed)
Called and spoke to Prentice from Dr Vikki Ports office. Advised per Dr Jaynee Eagles that she did not receive message from Dr Vikki Ports in [facility]. Asked her to fax report. She stated she would fax it over.

## 2016-03-19 NOTE — Telephone Encounter (Addendum)
Pt called requesting test results. Please call on cell at [phone]

## 2016-03-22 NOTE — Telephone Encounter (Signed)
It is my understanding that [facility] should have called patient and then met with him to review the results. After that he follows with me in the office, but I would like him to meet at [facility]. At least this is how Dr. Valentina Shaggy does it. Erasmo Downer would you call [facility] and see if this is still their policy? If her has neurocognitive testing done there, don;t they meet with them after the results are completed to review the testing?

## 2016-03-22 NOTE — Telephone Encounter (Signed)
It is my understanding that [facility] should have called patient and then met with him to review the results. After that he follows with me in the office, but I would like him to meet at [facility]. At least this is how Dr. Valentina Shaggy does it. Erasmo Downer would you call [facility] and see if this is still their policy? If her has neurocognitive testing done there, don;t they meet with them after the results are completed to review the testing

## 2016-03-22 NOTE — Telephone Encounter (Signed)
I spoke to Juan Johnson and advised him that he needs to call [facility]/Dr. McDermott's office and make an appt with Dr. Vikki Ports to go over the results. Dr. Jaynee Eagles can get a copy of the results, but the doctor that performed the test should be the one to go over the results with the Juan Johnson. I gave Juan Johnson Dr. Billee Cashing Dermott's number, and he is agreeable to calling and making an appt with Dr. McDermott's office to discuss the results.  Amelia from [facility] also returned my call, she verified that this is the correct process, Juan Johnson should call Dr. McDermott's office and make an appt to discuss results.

## 2016-03-22 NOTE — Telephone Encounter (Signed)
Wife called to request results from test at [facility], states "it's been 5 weeks and still hasn't heard anything". Please call [phone].

## 2016-03-22 NOTE — Telephone Encounter (Signed)
I called [facility] and spoke to [place]. She says that the process is usually that the pt makes an appt with Dr. Vikki Ports to go over the results. Pt should call and make this appt if he has not heard anything.  I called pt to discuss. No answer, left a message asking him to call me back.

## 2016-03-23 NOTE — Telephone Encounter (Signed)
Juan Johnson, Please let patient know he can follow up in the office with me after he meets with Dr. Vikki Ports. Ensure this message got to his wifs as well (patient has significant memory loss) that he has to call and meet with Dr. Vikki Ports at [facility] to review his neurocognitive testing. After he meets with Dr. Vikki Ports, we can meet if they schedule a follow up with me.  Also, his neurocognitive testing questioned whether his cognitive statuas (Major neurocognitive dysfunction) could have anything to do with his OSA. He sees Dr. Rexene Alberts for his sleep apnea management. He ws last seen in November with excellent compliance.  Dr Rexene Alberts, would you be able to pull his cpap report remotely and take a look please and let me know about his recent compliance and if his OSA is adequately being treated?    thank you.

## 2016-03-25 ENCOUNTER — Telehealth: Payer: Self-pay | Admitting: Cardiology

## 2016-03-25 ENCOUNTER — Encounter: Payer: Self-pay | Admitting: Nurse Practitioner

## 2016-03-25 ENCOUNTER — Ambulatory Visit
Admission: RE | Admit: 2016-03-25 | Discharge: 2016-03-25 | Disposition: A | Discharge status: Home / Self Care | Payer: Medicare Other | Source: Ambulatory Visit | Attending: Nurse Practitioner | Admitting: Nurse Practitioner

## 2016-03-25 ENCOUNTER — Ambulatory Visit (INDEPENDENT_AMBULATORY_CARE_PROVIDER_SITE_OTHER): Payer: Medicare Other | Admitting: Nurse Practitioner

## 2016-03-25 VITALS — BP 108/60 | Temp 97.5°F | Ht 69.0 in | Wt 223.0 lb

## 2016-03-25 DIAGNOSIS — I10 Essential (primary) hypertension: Secondary | ICD-10-CM

## 2016-03-25 DIAGNOSIS — I483 Typical atrial flutter: Secondary | ICD-10-CM

## 2016-03-25 DIAGNOSIS — I340 Nonrheumatic mitral (valve) insufficiency: Secondary | ICD-10-CM | POA: Diagnosis not present

## 2016-03-25 DIAGNOSIS — G4733 Obstructive sleep apnea (adult) (pediatric): Secondary | ICD-10-CM | POA: Diagnosis not present

## 2016-03-25 DIAGNOSIS — R042 Hemoptysis: Secondary | ICD-10-CM | POA: Diagnosis not present

## 2016-03-25 DIAGNOSIS — I348 Other nonrheumatic mitral valve disorders: Secondary | ICD-10-CM | POA: Diagnosis not present

## 2016-03-25 DIAGNOSIS — I4892 Unspecified atrial flutter: Secondary | ICD-10-CM | POA: Diagnosis not present

## 2016-03-25 DIAGNOSIS — R0602 Shortness of breath: Secondary | ICD-10-CM | POA: Diagnosis not present

## 2016-03-25 MED ORDER — LEVOFLOXACIN 500 MG PO TABS: 500.0000 mg | ORAL_TABLET | Freq: Every day | ORAL | Status: DC

## 2016-03-25 NOTE — Telephone Encounter (Signed)
CPAP download indicates adequate compliance at 70% but he has skipped 7 days in the last 30 days. Please advise patient to be fully compliant with treatment. Pressure is adequate at 11 cm as residual AHI is less than 5, currently at 2.7 per hour and air leak from the mask is also low.

## 2016-03-25 NOTE — Telephone Encounter (Signed)
Download printed and put on your desk

## 2016-03-25 NOTE — Patient Instructions (Addendum)
We will be checking the following labs today - BMET, CBC  Please go to [facility] to [place] on the first floor for a chest Xray - you may walk in.     Medication Instructions:    Continue with your current medicines. BUT  I am starting you on Levaquin 500 mg to take one a day - this is an antibiotic    Testing/Procedures To Be Arranged:  Echocardiogram  Follow-Up:   See me in one week with EKG     Other Special Instructions:   See your lung doctor tomorrow    If you need a refill on your cardiac medications before your next appointment, please call your pharmacy.   Call the [place] office at [phone] if you have any questions, problems or concerns.

## 2016-03-25 NOTE — Telephone Encounter (Signed)
Beverlee Nims: Can you pull latest 30 day download for cpap?

## 2016-03-25 NOTE — Telephone Encounter (Signed)
I have spoken with Juan Johnson this morning and per SA, advised that CPAP dl shows he is using it most of the time,  and it is working for him, but has missed 7 days over the last month.  I have encouraged increased compliance.  He verbalized understanding of same/fim

## 2016-03-25 NOTE — Telephone Encounter (Signed)
Wife calling back,still waiting to hear from [place]. I routed to the clinical pool,sice Malachy Mood is seeing patients.

## 2016-03-25 NOTE — Telephone Encounter (Signed)
Returned call to patient's DPR.  DPR describes pt as having irregular heartbeat, SOB, some fatigue. Denies Cp, palpitations, N/V. Pt spoke with Dr Wynonia Lawman over the weekend, who said to call us back if the irregular heartbeat continued. Metoprolol was decreased on 02/15/16.  Pt and family concerned that his heart is our of rhythm again. It is unclear whether the SOB and fatigue are from his COPD.  Appt made with FLEX today, 03/25/16.

## 2016-03-25 NOTE — Telephone Encounter (Signed)
Pt's dtr n law-calling c/o irregular heartrate since yesterday and weakness-spoke to Dr. Wynonia Lawman and was told to call and make appt-pls call

## 2016-03-25 NOTE — Progress Notes (Signed)
CARDIOLOGY OFFICE NOTE  Date:  03/25/2016    [name] Date of Birth: Mar 06, 1951 Medical Record [id number]  PCP:  Curly Rim, MD  Cardiologist:  [place]    Chief Complaint  Patient presents with  . Palpitations  . Hypertension    Work in visit - seen for Dr. Martinique    History of Present Illness: Juan Johnson is a 65 y.o. male who presents today for a work in visit. Seen for Dr. Martinique.   He has a history of history of A-Fib, HTN, COPD, Stroke, OSA, anxiety, depression, glaucoma, & anemia. He apparently hjd a heart cath > 10 years ago which revealed no CAD. He presented 10/03/15 with atrial flutter following back surgery. PT identified an elevated HR 150's and recommended he go to the ED. CHADSVASC 4. Eliquis was started. He had an echocardiogram 10/05/15 revealing an EF of 55-60%, normal wall motion, LA was normal in size, mild to moderate MR. He was started on Cardizem 90 mg PO TID and metoprolol 25bid. He converted spontaneously.   He was last seen back in March and felt to be doing ok.   Phone call earlier today - Returned call to patient's DPR. DPR describes pt as having irregular heartbeat, SOB, some fatigue. Denies Cp, palpitations, N/V. Pt spoke with Dr Wynonia Lawman over the weekend, who said to call us back if the irregular heartbeat continued. Metoprolol was decreased on 02/15/16. Pt and family concerned that his heart is our of rhythm again. It is unclear whether the SOB and fatigue are from his COPD. Appt made with FLEX today, 03/25/16.   Thus added to my schedule for today.  Comes in today. Here with his wife. She gives most of the history. He apparently has some degree of confusion as a baseline (he is asking Andee Poles if she was dancing on the bar at [facility]) - wife says it has progressed but this is how he talks at times. She feels like he sleeps too much. Has some chronic fatigue. No real palpitations. Was at the [facility] this past weekend -  developed a fever to 100.5, chills, cough that was associated with hemoptysis - described as "strings" in his sputum. He is not able to really tell me how much - just says "not much". Has done this before and was treated with antibiotics.  No fever today.  They came home last night for fear of needing medical care. His dose of Metoprolol was cut back in early May due to profound bradycardia. HR typically in the 50 to 60's. Over the last few days noted HR in the 80 to 90's. He does not really notice this. No sense of palpitations. Breathing seems at his baseline. It is difficult for him to wear his CPAP. No regular exercise. No chest pain.   Past Medical History  Diagnosis Date  . Hypertension   . Arthritis   . COPD (chronic obstructive pulmonary disease) ([place])   . Pneumonia 04/2015    "bronchial" pneumonia   . Stroke [facility]) ? 05/2014    peripheral vision loss  . Sleep apnea     does use cpap  . Depression   . Anxiety     claustrophobia  . GERD (gastroesophageal reflux disease)   . Enlarged liver     20-25 years ago  . Retinal tear of left eye   . Glaucoma   . 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  Complication of anesthesia     difficult to wake up after neck surgery  . A-fib ([place])   . Anemia   . Atrial fibrillation, new onset ([place]) 10/03/2015    Past Surgical History  Procedure Laterality Date  . Shoulder surgery  09/24/2011  . Eye surgery  10/2007  . Neck surgery  06/29/2010  . Tonsillectomy      AT AGE 32  . Leg surgery      AT AGE 56  . Vasectomy    . Colonoscopy    . Cardiac catheterization  2003  . Lumbar fusion  09/27/2015     Medications: Current Outpatient Prescriptions  Medication Sig Dispense Refill  . acetaminophen (TYLENOL) 500 MG tablet Take 500 mg by mouth every 4 (four) hours as needed for mild pain or moderate pain (Takes 2 every 4- 6 hours).     Marland Kitchen albuterol (PROVENTIL HFA;VENTOLIN HFA) 108 (90 Base) MCG/ACT inhaler Inhale into the  lungs every 6 (six) hours as needed for wheezing or shortness of breath.    . ALPRAZolam (XANAX) 0.5 MG tablet Take 1-2 30 minutes before MRi do not drive. May repeat if needed. 10 tablet 0  . apixaban (ELIQUIS) 5 MG TABS tablet Take 1 tablet (5 mg total) by mouth 2 (two) times daily. 180 tablet 3  . BREO ELLIPTA 100-25 MCG/INH AEPB Take 1 puff by mouth daily.    . Cetirizine HCl (ZYRTEC PO) Take 10 mg by mouth daily.     Marland Kitchen dextromethorphan-guaiFENesin (MUCINEX DM) 30-600 MG 12hr tablet Take 1 tablet by mouth 2 (two) times daily as needed for cough.     . diltiazem (CARDIZEM CD) 240 MG 24 hr capsule Take 1 capsule (240 mg total) by mouth daily. 90 capsule 3  . ezetimibe (ZETIA) 10 MG tablet Take 10 mg by mouth daily.    Marland Kitchen ketorolac (ACULAR) 0.5 % ophthalmic solution Place 1 drop into the left eye 4 (four) times daily.     Marland Kitchen latanoprost (XALATAN) 0.005 % ophthalmic solution Place 1 drop into both eyes at bedtime.     . metoprolol tartrate (LOPRESSOR) 25 MG tablet Take 0.5 tablets (12.5 mg total) by mouth 2 (two) times daily. 90 tablet 1  . montelukast (SINGULAIR) 10 MG tablet Take 10 mg by mouth at bedtime.     . Multiple Vitamins-Minerals (MULTIVITAMIN WITH MINERALS) tablet Take 1 tablet by mouth daily.    . niacin (NIASPAN) 1000 MG CR tablet Take 1,000 mg by mouth daily.    Marland Kitchen omeprazole (PRILOSEC) 20 MG capsule Take 20 mg by mouth 2 (two) times daily before a meal.     . valsartan-hydrochlorothiazide (DIOVAN-HCT) 160-12.5 MG tablet Take 1 tablet by mouth daily.    Marland Kitchen levofloxacin (LEVAQUIN) 500 MG tablet Take 1 tablet (500 mg total) by mouth daily. 7 tablet 0   No current facility-administered medications for this visit.    Allergies: Allergies  Allergen Reactions  . Crestor [Rosuvastatin] Other (See Comments)    Cramps and muscle weakness  . Simvastatin Other (See Comments)    Cramps and muscle weakness  . Ace Inhibitors Cough    Social History: The patient  reports that he quit  smoking about 18 years ago. His smoking use included Cigarettes. He has a 69 pack-year smoking history. He has never used smokeless tobacco. He reports that he does not drink alcohol or use illicit drugs.   Family History: The patient's family history includes Diabetes type II in his brother and  brother; Emphysema in his father; Hypertension in his mother; Macular degeneration in his mother; Stroke in his father.   Review of Systems: Please see the history of present illness.   Otherwise, the review of systems is positive for none.   All other systems are reviewed and negative.   Physical Exam: VS:  BP 108/60 mmHg  Temp(Src) 97.5 F (36.4 C)  Ht 5\' 9"  (1.753 m)  Wt 223 lb (101.152 kg)  BMI 32.92 kg/m2  SpO2 96% .  BMI Body mass index is 32.92 kg/(m^2).  Wt Readings from Last 3 Encounters:  03/25/16 223 lb (101.152 kg)  02/15/16 227 lb 9.6 oz (103.239 kg)  02/14/16 226 lb (102.513 kg)    General:  Little inappropriate with some of his comments but alert and in no acute distress. He is obese.  HEENT: Normal. Neck: Supple, no JVD, carotid bruits, or masses noted.  Cardiac: Irregular irregular rhythm. His rate is fine. Heart tones a little distant. No edema.  Respiratory:  Lungs are very coarse with normal work of breathing. He has a congested cough - he has dime sized amount of bright red blood noted one time with producing sputum during our visit.  GI: Soft and nontender.  MS: No deformity or atrophy. Gait and ROM intact. Skin: Warm and dry. Color is normal.  Neuro:  Strength and sensation are intact and no gross focal deficits noted.  Psych: Alert, appropriate and with normal affect.   LABORATORY DATA:  EKG:  EKG is ordered today. This demonstrates atrial fibrillation with a controlled VR of 61.  Lab Results  Component Value Date   WBC 11.5* 10/06/2015   HGB 9.2* 10/06/2015   HCT 27.2* 10/06/2015   PLT 343 10/06/2015   GLUCOSE 117* 10/05/2015   CHOL  05/27/2010    172         ATP III CLASSIFICATION:  <200     mg/dL   Desirable  200-239  mg/dL   Borderline High  >=240    mg/dL   High          TRIG 206* 05/27/2010   HDL 34* 05/27/2010   LDLCALC  05/27/2010    97        Total Cholesterol/HDL:CHD Risk Coronary Heart Disease Risk Table                     Men   Women  1/2 Average Risk   3.4   3.3  Average Risk       5.0   4.4  2 X Average Risk   9.6   7.1  3 X Average Risk  23.4   11.0        Use the calculated Patient Ratio above and the CHD Risk Table to determine the patient's CHD Risk.        ATP III CLASSIFICATION (LDL):  <100     mg/dL   Optimal  100-129  mg/dL   Near or Above                    Optimal  130-159  mg/dL   Borderline  160-189  mg/dL   High  >190     mg/dL   Very High   ALT 24 06/24/2014   AST 25 06/24/2014   NA 133* 10/05/2015   K 4.4 10/05/2015   CL 99* 10/05/2015   CREATININE 0.93 10/05/2015   BUN 15 10/05/2015   CO2 26 10/05/2015  TSH 0.651 10/03/2015   INR 1.06 10/03/2015   HGBA1C 6.0* 06/24/2014    BNP (last 3 results) No results for input(s): BNP in the last 8760 hours.  ProBNP (last 3 results) No results for input(s): PROBNP in the last 8760 hours.   Other Studies Reviewed Today:  Echo Study Conclusions from 09/2015  - Left ventricle: The cavity size was normal. Wall thickness was  normal. Systolic function was normal. The estimated ejection  fraction was in the range of 55% to 60%. Wall motion was normal;  there were no regional wall motion abnormalities. Left  ventricular diastolic function parameters were normal. - Mitral valve: There was mild to moderate regurgitation. - Pulmonary arteries: PA peak pressure: 43 mm Hg (S).  Impressions:  - Normal LV systolic and diastolic function; mild to moderate MR;  trace TR.  Assessment/Plan: 1. Recurrent atrial flutter/fibrillation today - his rate is well controlled. He remains on Eliquis. He now has hemoptysis as a result of probable COPD  exacerbation. This will complicate matters. However, I do not get the feeling that he is very symptomatic from the AF but more so his bronchitis. Will start Levaquin 500 mg a day. He is seeing pulmonary tomorrow for further disposition. Will try to not interrupt his Eliquis but this may be needed. CXR and lab here today. Defer possible CT to pulmonary - he is seeing them tomorrow. I will see back in one week with EKG and if he remains in AF - will discuss possible cardioversion. Go ahead and get his echo updated as well.   2.  Atrial flutter note post op back surgery.   3. HTN currently controlled. Prior to Aflutter he was on Diovan HCT. Now on diltiazem and metoprolol. Will continue.   4. MR. Mild to moderate by Echo. Will go ahead and get his echo updated.   5. Hemoptysis - most likely from COPD exacerbation - will go ahead and get him on antibiotics. I have continued his Eliquis for now - but would have a low threshold of holding but will interfere with trying to cardiovert in the near future.   Current medicines are reviewed with the patient today.  The patient does not have concerns regarding medicines other than what has been noted above.  The following changes have been made:  See above.  Labs/ tests ordered today include:    Orders Placed This Encounter  Procedures  . DG Chest 2 View  . Basic metabolic panel  . CBC  . EKG 12-Lead  . ECHOCARDIOGRAM COMPLETE     Disposition:   FU with me in one week with EKG  Patient is agreeable to this plan and will call if any problems develop in the interim.   Signed: Burtis Junes, RN, ANP-C 03/25/2016 2:51 PM  [facility] [address] [place], [place]  [address] Phone: [phone] Fax: [phone]

## 2016-03-26 ENCOUNTER — Telehealth: Payer: Self-pay | Admitting: Nurse Practitioner

## 2016-03-26 DIAGNOSIS — G4733 Obstructive sleep apnea (adult) (pediatric): Secondary | ICD-10-CM | POA: Diagnosis not present

## 2016-03-26 DIAGNOSIS — Z9989 Dependence on other enabling machines and devices: Secondary | ICD-10-CM | POA: Diagnosis not present

## 2016-03-26 DIAGNOSIS — J449 Chronic obstructive pulmonary disease, unspecified: Secondary | ICD-10-CM | POA: Diagnosis not present

## 2016-03-26 LAB — BASIC METABOLIC PANEL
BUN: 17 mg/dL (ref 7–25)
CO2: 22 mmol/L (ref 20–31)
Calcium: 8.8 mg/dL (ref 8.6–10.3)
Chloride: 99 mmol/L (ref 98–110)
Creat: 0.99 mg/dL (ref 0.70–1.25)
Glucose, Bld: 85 mg/dL (ref 65–99)
Potassium: 3.8 mmol/L (ref 3.5–5.3)
Sodium: 137 mmol/L (ref 135–146)

## 2016-03-26 LAB — CBC
HCT: 34.2 % — ABNORMAL LOW (ref 38.5–50.0)
Hemoglobin: 11.2 g/dL — ABNORMAL LOW (ref 13.2–17.1)
MCH: 26.9 pg — ABNORMAL LOW (ref 27.0–33.0)
MCHC: 32.7 g/dL (ref 32.0–36.0)
MCV: 82.2 fL (ref 80.0–100.0)
MPV: 9.5 fL (ref 7.5–12.5)
Platelets: 279 10*3/uL (ref 140–400)
RBC: 4.16 MIL/uL — ABNORMAL LOW (ref 4.20–5.80)
RDW: 14.9 % (ref 11.0–15.0)
WBC: 14.1 10*3/uL — ABNORMAL HIGH (ref 3.8–10.8)

## 2016-03-26 NOTE — Telephone Encounter (Signed)
New message ° ° ° ° °Returning a call to the nurse °

## 2016-04-01 ENCOUNTER — Ambulatory Visit (INDEPENDENT_AMBULATORY_CARE_PROVIDER_SITE_OTHER): Payer: Medicare Other | Admitting: Nurse Practitioner

## 2016-04-01 ENCOUNTER — Encounter: Payer: Self-pay | Admitting: Nurse Practitioner

## 2016-04-01 VITALS — BP 138/62 | HR 44 | Ht 69.0 in | Wt 221.0 lb

## 2016-04-01 DIAGNOSIS — I10 Essential (primary) hypertension: Secondary | ICD-10-CM

## 2016-04-01 DIAGNOSIS — R042 Hemoptysis: Secondary | ICD-10-CM | POA: Diagnosis not present

## 2016-04-01 DIAGNOSIS — I481 Persistent atrial fibrillation: Secondary | ICD-10-CM

## 2016-04-01 DIAGNOSIS — I4819 Other persistent atrial fibrillation: Secondary | ICD-10-CM

## 2016-04-01 DIAGNOSIS — I483 Typical atrial flutter: Secondary | ICD-10-CM | POA: Diagnosis not present

## 2016-04-01 NOTE — Patient Instructions (Addendum)
We will be checking the following labs today - NONE   Medication Instructions:    Continue with your current medicines.     Testing/Procedures To Be Arranged:  Cancel echo  Follow-Up:   See Dr. [place]ique in September (has recall visit)    Other Special Instructions:   Monitor HR and keep a record - call us for any dizzy spells.     If you need a refill on your cardiac medications before your next appointment, please call your pharmacy.   Call the [place] office at [phone] if you have any questions, problems or concerns.

## 2016-04-01 NOTE — Progress Notes (Signed)
CARDIOLOGY OFFICE NOTE  Date:  04/01/2016    [name] Date of Birth: 1951/07/31 Medical Record [id number]  PCP:  Curly Rim, MD  Cardiologist:  Jaslynne Dahan & Martin[place]    Chief Complaint  Patient presents with  . Atrial Fibrillation    Follow up visit - seen for Dr. Martinique    History of Present Illness: Juan Johnson is a 65 y.o. male who presents today for a follow up visit.  Seen for Dr. Martinique.   He has a history of history of A-Fib, HTN, COPD, Stroke, OSA, anxiety, depression, glaucoma, & anemia. He apparently hjd a heart cath > 10 years ago which revealed no CAD. He presented 10/03/15 with atrial flutter following back surgery. PT identified an elevated HR 150's and recommended he go to the ED. CHADSVASC 4. Eliquis was started. He had an echocardiogram 10/05/15 revealing an EF of 55-60%, normal wall motion, LA was normal in size, mild to moderate MR. He was started on Cardizem 90 mg PO TID and metoprolol 25bid. He converted spontaneously.   He was last seen back in March and felt to be doing ok.   I saw him last week - had hemoptysis. Back out of rhythm. On Eliquis. Ended up having pneumonia - I started his antibiotics. He saw his pulmonologist.   Comes in today. Here with his wife. He is doing much better. Back in rhythm. No more hemoptysis. Not dizzy or lightheaded. He is happy with how he is doing. Breathing better. No chest pain.    Past Medical History  Diagnosis Date  . Hypertension   . Arthritis   . COPD (chronic obstructive pulmonary disease) [place])   . Pneumonia 04/2015    "bronchial" pneumonia   . Stroke [facility]) ? 05/2014    peripheral vision loss  . Sleep apnea     does use cpap  . Depression   . Anxiety     claustrophobia  . GERD (gastroesophageal reflux disease)   . Enlarged liver     20-25 years ago  . Retinal tear of left eye   . Glaucoma   . 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 Complication of anesthesia    difficult to wake up after neck surgery  . A-fib ([place])   . Anemia   . Atrial fibrillation, new onset ([place]) 10/03/2015    Past Surgical History  Procedure Laterality Date  . Shoulder surgery  09/24/2011  . Eye surgery  10/2007  . Neck surgery  06/29/2010  . Tonsillectomy      AT AGE 22  . Leg surgery      AT AGE 42  . Vasectomy    . Colonoscopy    . Cardiac catheterization  2003  . Lumbar fusion  09/27/2015     Medications: Current Outpatient Prescriptions  Medication Sig Dispense Refill  . acetaminophen (TYLENOL) 500 MG tablet Take 500 mg by mouth every 4 (four) hours as needed for mild pain or moderate pain (Takes 2 every 4- 6 hours).     Marland Kitchen albuterol (PROVENTIL HFA;VENTOLIN HFA) 108 (90 Base) MCG/ACT inhaler Inhale into the lungs every 6 (six) hours as needed for wheezing or shortness of breath.    . ALPRAZolam (XANAX) 0.5 MG tablet Take 1-2 30 minutes before MRi do not drive. May repeat if needed. 10 tablet 0  . apixaban (ELIQUIS) 5 MG TABS tablet Take 1 tablet (5 mg total) by mouth  2 (two) times daily. 180 tablet 3  . BREO ELLIPTA 100-25 MCG/INH AEPB Take 1 puff by mouth daily.    . Cetirizine HCl (ZYRTEC PO) Take 10 mg by mouth daily.     Marland Kitchen dextromethorphan-guaiFENesin (MUCINEX DM) 30-600 MG 12hr tablet Take 1 tablet by mouth 2 (two) times daily as needed for cough.     . diltiazem (CARDIZEM CD) 240 MG 24 hr capsule Take 1 capsule (240 mg total) by mouth daily. 90 capsule 3  . ezetimibe (ZETIA) 10 MG tablet Take 10 mg by mouth daily.    Marland Kitchen ketorolac (ACULAR) 0.5 % ophthalmic solution Place 1 drop into the left eye 4 (four) times daily.     Marland Kitchen latanoprost (XALATAN) 0.005 % ophthalmic solution Place 1 drop into both eyes at bedtime.     . metoprolol tartrate (LOPRESSOR) 25 MG tablet Take 0.5 tablets (12.5 mg total) by mouth 2 (two) times daily. 90 tablet 1  . montelukast (SINGULAIR) 10 MG tablet Take 10 mg by mouth at bedtime.     . Multiple Vitamins-Minerals (MULTIVITAMIN WITH  MINERALS) tablet Take 1 tablet by mouth daily.    . niacin (NIASPAN) 1000 MG CR tablet Take 1,000 mg by mouth daily.    Marland Kitchen omeprazole (PRILOSEC) 20 MG capsule Take 20 mg by mouth 2 (two) times daily before a meal.     . valsartan-hydrochlorothiazide (DIOVAN-HCT) 160-12.5 MG tablet Take 1 tablet by mouth daily.     No current facility-administered medications for this visit.    Allergies: Allergies  Allergen Reactions  . Crestor [Rosuvastatin] Other (See Comments)    Cramps and muscle weakness  . Simvastatin Other (See Comments)    Cramps and muscle weakness  . Ace Inhibitors Cough    Social History: The patient  reports that he quit smoking about 18 years ago. His smoking use included Cigarettes. He has a 69 pack-year smoking history. He has never used smokeless tobacco. He reports that he does not drink alcohol or use illicit drugs.   Family History: The patient's family history includes Diabetes type II in his brother and brother; Emphysema in his father; Hypertension in his mother; Macular degeneration in his mother; Stroke in his father.   Review of Systems: Please see the history of present illness.   Otherwise, the review of systems is positive for none.   All other systems are reviewed and negative.   Physical Exam: VS:  BP 138/62 mmHg  Pulse 44  Ht 5\' 9"  (1.753 m)  Wt 221 lb (100.245 kg)  BMI 32.62 kg/m2 .  BMI Body mass index is 32.62 kg/(m^2).  Wt Readings from Last 3 Encounters:  04/01/16 221 lb (100.245 kg)  03/25/16 223 lb (101.152 kg)  02/15/16 227 lb 9.6 oz (103.239 kg)    General: Pleasant. Well developed, well nourished and in no acute distress.  HEENT: Normal. Neck: Supple, no JVD, carotid bruits, or masses noted.  Cardiac: Regular rate and rhythm. HR is 52 by me. Soft systolic murmur noted.  No edema.  Respiratory:  Lungs are clear to auscultation bilaterally with normal work of breathing.  GI: Soft and nontender.  MS: No deformity or atrophy. Gait and  ROM intact. Skin: Warm and dry. Color is normal.  Neuro:  Strength and sensation are intact and no gross focal deficits noted.  Psych: Alert, appropriate and with normal affect.   LABORATORY DATA:  EKG:  EKG is ordered today. This demonstrates sinus bradycardia - rate is 44 by EKG.  Lab Results  Component Value Date   WBC 14.1* 03/25/2016   HGB 11.2* 03/25/2016   HCT 34.2* 03/25/2016   PLT 279 03/25/2016   GLUCOSE 85 03/25/2016   CHOL  05/27/2010    172        ATP III CLASSIFICATION:  <200     mg/dL   Desirable  200-239  mg/dL   Borderline High  >=240    mg/dL   High          TRIG 206* 05/27/2010   HDL 34* 05/27/2010   LDLCALC  05/27/2010    97        Total Cholesterol/HDL:CHD Risk Coronary Heart Disease Risk Table                     Men   Women  1/2 Average Risk   3.4   3.3  Average Risk       5.0   4.4  2 X Average Risk   9.6   7.1  3 X Average Risk  23.4   11.0        Use the calculated Patient Ratio above and the CHD Risk Table to determine the patient's CHD Risk.        ATP III CLASSIFICATION (LDL):  <100     mg/dL   Optimal  100-129  mg/dL   Near or Above                    Optimal  130-159  mg/dL   Borderline  160-189  mg/dL   High  >190     mg/dL   Very High   ALT 24 06/24/2014   AST 25 06/24/2014   NA 137 03/25/2016   K 3.8 03/25/2016   CL 99 03/25/2016   CREATININE 0.99 03/25/2016   BUN 17 03/25/2016   CO2 22 03/25/2016   TSH 0.651 10/03/2015   INR 1.06 10/03/2015   HGBA1C 6.0* 06/24/2014    BNP (last 3 results) No results for input(s): BNP in the last 8760 hours.  ProBNP (last 3 results) No results for input(s): PROBNP in the last 8760 hours.   Other Studies Reviewed Today:  Echo Study Conclusions from 09/2015  - Left ventricle: The cavity size was normal. Wall thickness was  normal. Systolic function was normal. The estimated ejection  fraction was in the range of 55% to 60%. Wall motion was normal;  there were no regional  wall motion abnormalities. Left  ventricular diastolic function parameters were normal. - Mitral valve: There was mild to moderate regurgitation. - Pulmonary arteries: PA peak pressure: 43 mm Hg (S).  Impressions:  - Normal LV systolic and diastolic function; mild to moderate MR;  trace TR.  Assessment/Plan: 1. Recurrent atrial flutter/fibrillation today - he is back in sinus - most likely his recent arrhythmia was triggered by his pneumonia. Cancelled his echo. No need for cardioversion - I have left him on his current regimen for now.   2. Atrial flutter note post op back surgery.   3. HTN currently controlled. Prior to Aflutter he was on Diovan HCT. Now on diltiazem and metoprolol. Will continue.   4. MR. Mild to moderate by Echo. Echo to be arranged at next follow up.   5. Hemoptysis - resolved.  6. Bradycardia - asymptomatic. They monitor his HR at home - typically in the low 50's. No change in medicines for now but may need PPM in the future - not  now. Will follow.   Current medicines are reviewed with the patient today.  The patient does not have concerns regarding medicines other than what has been noted above.  The following changes have been made:  See above.  Labs/ tests ordered today include:   No orders of the defined types were placed in this encounter.     Disposition:   FU with Dr. Martinique as planned in September.    Patient is agreeable to this plan and will call if any problems develop in the interim.   Signed: Burtis Junes, RN, ANP-C 04/01/2016 10:57 AM  [facility] [address] [place], [place]  [address] Phone: [phone] Fax: [phone]

## 2016-04-10 ENCOUNTER — Other Ambulatory Visit (HOSPITAL_COMMUNITY): Payer: Medicare Other

## 2016-04-17 ENCOUNTER — Telehealth: Payer: Self-pay | Admitting: *Deleted

## 2016-04-17 ENCOUNTER — Telehealth: Payer: Self-pay | Admitting: Neurology

## 2016-04-17 ENCOUNTER — Ambulatory Visit (INDEPENDENT_AMBULATORY_CARE_PROVIDER_SITE_OTHER): Payer: Medicare Other | Admitting: Neurology

## 2016-04-17 VITALS — BP 152/60 | HR 49 | Ht 69.0 in | Wt 223.4 lb

## 2016-04-17 DIAGNOSIS — G4733 Obstructive sleep apnea (adult) (pediatric): Secondary | ICD-10-CM

## 2016-04-17 DIAGNOSIS — F0391 Unspecified dementia with behavioral disturbance: Secondary | ICD-10-CM

## 2016-04-17 DIAGNOSIS — Z9989 Dependence on other enabling machines and devices: Principal | ICD-10-CM

## 2016-04-17 DIAGNOSIS — F03918 Unspecified dementia, unspecified severity, with other behavioral disturbance: Secondary | ICD-10-CM

## 2016-04-17 MED ORDER — DONEPEZIL HCL 10 MG PO TABS: 10.0000 mg | ORAL_TABLET | Freq: Every day | ORAL | Status: DC

## 2016-04-17 NOTE — Telephone Encounter (Signed)
[facility]. Spoke w/ beth. She states pt did not have appt at [facility]. He was seen by MCDERMOTT, ADAM.   I called and LVM for Maureen Ralphs. To call back.  I LVM to let her know Dr Jaynee Eagles is requesting recent office notes/testing results faxed to [phone]. Gave [facility] phone number for her to call back to let me know she got message.

## 2016-04-17 NOTE — Telephone Encounter (Signed)
Order faxed to [facility]

## 2016-04-17 NOTE — Patient Instructions (Signed)
As far as your medications are concerned, I would like to suggest: Aricept 10mg . Start with 1/2 tablet then can increase to a whole pill in a week.   I would like to see you back in 4 months, sooner if we need to. Please call us with any interim questions, concerns, problems, updates or refill requests.   Our phone number is [phone]. We also have an after hours call service for urgent matters and there is a physician on-call for urgent questions. For any emergencies you know to call 911 or go to the nearest emergency room  Donepezil tablets What is this medicine? DONEPEZIL (doe NEP e zil) is used to treat mild to moderate dementia caused by Alzheimer's disease. This medicine may be used for other purposes; ask your health care provider or pharmacist if you have questions. What should I tell my health care provider before I take this medicine? They need to know if you have any of these conditions: -asthma or other lung disease -difficulty passing urine -head injury -heart disease -history of irregular heartbeat -liver disease -seizures (convulsions) -stomach or intestinal disease, ulcers or stomach bleeding -an unusual or allergic reaction to donepezil, other medicines, foods, dyes, or preservatives -pregnant or trying to get pregnant -breast-feeding How should I use this medicine? Take this medicine by mouth with a glass of water. Follow the directions on the prescription label. You may take this medicine with or without food. Take this medicine at regular intervals. This medicine is usually taken before bedtime. Do not take it more often than directed. Continue to take your medicine even if you feel better. Do not stop taking except on your doctor's advice. If you are taking the 23 mg donepezil tablet, swallow it whole; do not cut, crush, or chew it. Talk to your pediatrician regarding the use of this medicine in children. Special care may be needed. Overdosage: If you think you have  taken too much of this medicine contact a poison control center or emergency room at once. NOTE: This medicine is only for you. Do not share this medicine with others. What if I miss a dose? If you miss a dose, take it as soon as you can. If it is almost time for your next dose, take only that dose, do not take double or extra doses. What may interact with this medicine? Do not take this medicine with any of the following medications: -certain medicines for fungal infections like itraconazole, fluconazole, posaconazole, and voriconazole -cisapride -dextromethorphan; quinidine -dofetilide -dronedarone -pimozide -quinidine -thioridazine -ziprasidone This medicine may also interact with the following medications: -antihistamines for allergy, cough and cold -atropine -bethanechol -carbamazepine -certain medicines for bladder problems like oxybutynin, tolterodine -certain medicines for Parkinson's disease like benztropine, trihexyphenidyl -certain medicines for stomach problems like dicyclomine, hyoscyamine -certain medicines for travel sickness like scopolamine -dexamethasone -ipratropium -NSAIDs, medicines for pain and inflammation, like ibuprofen or naproxen -other medicines for Alzheimer's disease -other medicines that prolong the QT interval (cause an abnormal heart rhythm) -phenobarbital -phenytoin -rifampin, rifabutin or rifapentine This list may not describe all possible interactions. Give your health care provider a list of all the medicines, herbs, non-prescription drugs, or dietary supplements you use. Also tell them if you smoke, drink alcohol, or use illegal drugs. Some items may interact with your medicine. What should I watch for while using this medicine? Visit your doctor or health care professional for regular checks on your progress. Check with your doctor or health care professional if your symptoms do not  get better or if they get worse. You may get drowsy or dizzy.  Do not drive, use machinery, or do anything that needs mental alertness until you know how this drug affects you. What side effects may I notice from receiving this medicine? Side effects that you should report to your doctor or health care professional as soon as possible: -allergic reactions like skin rash, itching or hives, swelling of the face, lips, or tongue -changes in vision -feeling faint or lightheaded, falls -problems with balance -redness, blistering, peeling or loosening of the skin, including inside the mouth -slow heartbeat, or palpitations -stomach pain -unusual bleeding or bruising, red or purple spots on the skin -vomiting -weight loss Side effects that usually do not require medical attention (report to your doctor or health care professional if they continue or are bothersome): -diarrhea, especially when starting treatment -headache -indigestion or heartburn -loss of appetite -muscle cramps -nausea This list may not describe all possible side effects. Call your doctor for medical advice about side effects. You may report side effects to FDA at 1-800-FDA-1088. Where should I keep my medicine? Keep out of reach of children. Store at room temperature between 15 and 30 degrees C (59 and 86 degrees F). Throw away any unused medicine after the expiration date. NOTE: This sheet is a summary. It may not cover all possible information. If you have questions about this medicine, talk to your doctor, pharmacist, or health care provider.    2016, [employer]. (2014-05-12 07:51:52)

## 2016-04-17 NOTE — Telephone Encounter (Signed)
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 Leak acceptable with the 95th percentile at 9.4 L/m. Patient reports residual snoring. We could certainly try to increase the pressure to 12 cm to see if we can improve his residual AHI and his snoring. I placed an order for a pressure increase and we will fax it to his DME company.

## 2016-04-17 NOTE — Telephone Encounter (Signed)
Dr Jaynee Eagles- received report from [facility] via fax. I put with your notes from today. Thank you!
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
## 2016-04-18 ENCOUNTER — Encounter: Payer: Self-pay | Admitting: Neurology

## 2016-04-18 DIAGNOSIS — F0391 Unspecified dementia with behavioral disturbance: Secondary | ICD-10-CM | POA: Insufficient documentation

## 2016-04-18 DIAGNOSIS — F03918 Unspecified dementia, unspecified severity, with other behavioral disturbance: Secondary | ICD-10-CM | POA: Insufficient documentation

## 2016-04-22 ENCOUNTER — Encounter: Payer: Self-pay | Admitting: Neurology

## 2016-04-24 DIAGNOSIS — R413 Other amnesia: Secondary | ICD-10-CM | POA: Diagnosis not present

## 2016-04-25 ENCOUNTER — Telehealth: Payer: Self-pay | Admitting: Neurology

## 2016-04-25 NOTE — Telephone Encounter (Signed)
Juan Johnson, tell them to go back to 5mg  thanks

## 2016-04-25 NOTE — Telephone Encounter (Signed)
Dr Ahern- please advise 

## 2016-04-25 NOTE — Telephone Encounter (Signed)
Called and spoke to wife. Relayed per Dr Jaynee Eagles to go back to 5mg . Advised her to call back if his sx do not get better after decreasing. She verbalized understanding.

## 2016-04-25 NOTE — Telephone Encounter (Signed)
Pt's wife called sts he did good on donepezil (ARICEPT) 10 MG tablet 1/2 but since increasing to 1 tab yesterday 04/25/16 he has stomach pain and ache, heart burn , bloated, diarrhea, and dizzy. Please call

## 2016-05-14 DIAGNOSIS — H524 Presbyopia: Secondary | ICD-10-CM | POA: Diagnosis not present

## 2016-05-14 DIAGNOSIS — H401132 Primary open-angle glaucoma, bilateral, moderate stage: Secondary | ICD-10-CM | POA: Diagnosis not present

## 2016-05-23 ENCOUNTER — Telehealth: Payer: Self-pay | Admitting: Neurology

## 2016-05-23 NOTE — Telephone Encounter (Signed)
Dr Jaynee Eagles- please advise. I did make f/u. Would you like to do anything different?  Called wife back. She stated that husband went and saw Dr Vikki Ports and he thought he was on "road to be cured" and did not have Alzheimer's. Wife has tried to explain to him that this is not the case. He believes otherwise. 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 She states he gets very agitated easily, especially when the grand kids are around. He also gets upset if he is feeling left out. I went over mychart message from Dr Jaynee Eagles sent on 04/23/16. She states they were having problems with mychart. I went over Celexa/side effects. She wrote down name of medication. She was more comfortable making appt for pt and having him talk directly to Dr Jaynee Eagles. I made  F/u per wife request on 06/03/16. Advised to check in by 945am. She verbalized understanding.

## 2016-05-23 NOTE — Telephone Encounter (Signed)
Pt 's wife called said pt is doing well on aricept 5mg  1 x day. She said he can not tolerate 10mg . She said was a depression medication to be called in. I told her there was an email sent by Dr A reg the medication, she said they were having problems getting into mychart and did not get it. Please call

## 2016-06-03 ENCOUNTER — Encounter: Payer: Self-pay | Admitting: Neurology

## 2016-06-03 ENCOUNTER — Ambulatory Visit (INDEPENDENT_AMBULATORY_CARE_PROVIDER_SITE_OTHER): Payer: Medicare Other | Admitting: Neurology

## 2016-06-03 VITALS — BP 126/65 | HR 44 | Ht 69.0 in | Wt 219.2 lb

## 2016-06-03 DIAGNOSIS — F05 Delirium due to known physiological condition: Secondary | ICD-10-CM

## 2016-06-03 DIAGNOSIS — R413 Other amnesia: Secondary | ICD-10-CM | POA: Diagnosis not present

## 2016-06-03 DIAGNOSIS — F0390 Unspecified dementia without behavioral disturbance: Secondary | ICD-10-CM

## 2016-06-03 DIAGNOSIS — R410 Disorientation, unspecified: Secondary | ICD-10-CM

## 2016-06-03 MED ORDER — SERTRALINE HCL 50 MG PO TABS: 50.0000 mg | ORAL_TABLET | Freq: Every day | ORAL | 12 refills | Status: DC

## 2016-06-03 NOTE — Patient Instructions (Signed)
Remember to drink plenty of fluid, eat healthy meals and do not skip any meals. Try to eat protein with a every meal and eat a healthy snack such as fruit or nuts in between meals. Try to keep a regular sleep-wake schedule and try to exercise daily, particularly in the form of walking, 20-30 minutes a day, if you can.   As far as your medications are concerned, I would like to suggest: Continue Aricept 5mg  daily Zoloft 50mg  daily  As far as diagnostic testing: FDG PET scan  I would like to see you back in 6 months, sooner if we need to. Please call us with any interim questions, concerns, problems, updates or refill requests.  Our phone number is [phone]. We also have an after hours call service for urgent matters and there is a physician on-call for urgent questions. For any emergencies you know to call 911 or go to the nearest emergency room  Sertraline tablets What is this medicine? SERTRALINE (SER tra leen) is used to treat depression. It may also be used to treat obsessive compulsive disorder, panic disorder, post-trauma stress, premenstrual dysphoric disorder (PMDD) or social anxiety. This medicine may be used for other purposes; ask your health care provider or pharmacist if you have questions. What should I tell my health care provider before I take this medicine? They need to know if you have any of these conditions: -bipolar disorder or a family history of bipolar disorder -diabetes -glaucoma -heart disease -high blood pressure -history of irregular heartbeat -history of low levels of calcium, magnesium, or potassium in the blood -if you often drink alcohol -liver disease -receiving electroconvulsive therapy -seizures -suicidal thoughts, plans, or attempt; a previous suicide attempt by you or a family member -thyroid disease -an unusual or allergic reaction to sertraline, other medicines, foods, dyes, or preservatives -pregnant or trying to get  pregnant -breast-feeding How should I use this medicine? Take this medicine by mouth with a glass of water. Follow the directions on the prescription label. You can take it with or without food. Take your medicine at regular intervals. Do not take your medicine more often than directed. Do not stop taking this medicine suddenly except upon the advice of your doctor. Stopping this medicine too quickly may cause serious side effects or your condition may worsen. A special MedGuide will be given to you by the pharmacist with each prescription and refill. Be sure to read this information carefully each time. Talk to your pediatrician regarding the use of this medicine in children. While this drug may be prescribed for children as young as 7 years for selected conditions, precautions do apply. Overdosage: If you think you have taken too much of this medicine contact a poison control center or emergency room at once. NOTE: This medicine is only for you. Do not share this medicine with others. What if I miss a dose? If you miss a dose, take it as soon as you can. If it is almost time for your next dose, take only that dose. Do not take double or extra doses. What may interact with this medicine? Do not take this medicine with any of the following medications: -certain medicines for fungal infections like fluconazole, itraconazole, ketoconazole, posaconazole, voriconazole -cisapride -disulfiram -dofetilide -linezolid -MAOIs like Carbex, Eldepryl, Marplan, Nardil, and Parnate -metronidazole -methylene blue (injected into a vein) -pimozide -thioridazine -ziprasidone This medicine may also interact with the following medications: -alcohol -aspirin and aspirin-like medicines -certain medicines for depression, anxiety, or psychotic disturbances -certain  medicines for irregular heart beat like flecainide, propafenone -certain medicines for migraine headaches like almotriptan, eletriptan, frovatriptan,  naratriptan, rizatriptan, sumatriptan, zolmitriptan -certain medicines for sleep -certain medicines for seizures like carbamazepine, valproic acid, phenytoin -certain medicines that treat or prevent blood clots like warfarin, enoxaparin, dalteparin -cimetidine -digoxin -diuretics -fentanyl -furazolidone -isoniazid -lithium -NSAIDs, medicines for pain and inflammation, like ibuprofen or naproxen -other medicines that prolong the QT interval (cause an abnormal heart rhythm) -procarbazine -rasagiline -supplements like St. John's wort, kava kava, valerian -tolbutamide -tramadol -tryptophan This list may not describe all possible interactions. Give your health care provider a list of all the medicines, herbs, non-prescription drugs, or dietary supplements you use. Also tell them if you smoke, drink alcohol, or use illegal drugs. Some items may interact with your medicine. What should I watch for while using this medicine? Tell your doctor if your symptoms do not get better or if they get worse. Visit your doctor or health care professional for regular checks on your progress. Because it may take several weeks to see the full effects of this medicine, it is important to continue your treatment as prescribed by your doctor. Patients and their families should watch out for new or worsening thoughts of suicide or depression. Also watch out for sudden changes in feelings such as feeling anxious, agitated, panicky, irritable, hostile, aggressive, impulsive, severely restless, overly excited and hyperactive, or not being able to sleep. If this happens, especially at the beginning of treatment or after a change in dose, call your health care professional. Dennis Bast may get drowsy or dizzy. Do not drive, use machinery, or do anything that needs mental alertness until you know how this medicine affects you. Do not stand or sit up quickly, especially if you are an older patient. This reduces the risk of dizzy or  fainting spells. Alcohol may interfere with the effect of this medicine. Avoid alcoholic drinks. Your mouth may get dry. Chewing sugarless gum or sucking hard candy, and drinking plenty of water may help. Contact your doctor if the problem does not go away or is severe. What side effects may I notice from receiving this medicine? Side effects that you should report to your doctor or health care professional as soon as possible: -allergic reactions like skin rash, itching or hives, swelling of the face, lips, or tongue -black or bloody stools, blood in the urine or vomit -fast, irregular heartbeat -feeling faint or lightheaded, falls -hallucination, loss of contact with reality -seizures -suicidal thoughts or other mood changes -unusual bleeding or bruising -unusually weak or tired -vomiting Side effects that usually do not require medical attention (report to your doctor or health care professional if they continue or are bothersome): -change in appetite -change in sex drive or performance -diarrhea -increased sweating -indigestion, nausea -tremors This list may not describe all possible side effects. Call your doctor for medical advice about side effects. You may report side effects to FDA at 1-800-FDA-1088. Where should I keep my medicine? Keep out of the reach of children. Store at room temperature between 15 and 30 degrees C (59 and 86 degrees F). Throw away any unused medicine after the expiration date. NOTE: This sheet is a summary. It may not cover all possible information. If you have questions about this medicine, talk to your doctor, pharmacist, or health care provider.    2016, [employer]. (2013-04-27 12:57:35)
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
## 2016-06-04 ENCOUNTER — Encounter: Payer: Self-pay | Admitting: *Deleted

## 2016-06-04 ENCOUNTER — Telehealth: Payer: Self-pay | Admitting: Neurology

## 2016-06-04 DIAGNOSIS — F0390 Unspecified dementia without behavioral disturbance: Secondary | ICD-10-CM | POA: Insufficient documentation

## 2016-06-04 NOTE — Telephone Encounter (Signed)
Per [facility] long order need to be changed. If patient has had PET scan before needs to be . PET scan restaging skull base to thigh.  If patient  Has not had one needs to be PET scan initial skull base to thigh.

## 2016-06-04 NOTE — Telephone Encounter (Signed)
Order is correct Patient is scheduled  For PET scan at [facility] 06-10-2016 arrive at 12:30 for a 1:00 apt. Patient needs to be NPO six hours before test . I have went over all details with patient and made him call details back to me he understood. He was a joy to talk to. Elvina Sidle [phone]. Thanks Hinton Dyer.

## 2016-06-04 NOTE — Telephone Encounter (Signed)
This is an FDG pet scan for dementia not for cancer, Did I order it wrong?

## 2016-06-05 NOTE — Telephone Encounter (Signed)
Juan Johnson, patient has dementia. Can you call back and review everything with his wife? Patient will no ttell her and he won't remember. Please call back and talk to wife thanks. Let wife know you went through this with patient and see if he told her. thanks

## 2016-06-06 NOTE — Telephone Encounter (Signed)
Spoke to Mrs. Wale she is aware of all details of her husbands PET scan she relayed they will be there. Thanks Hinton Dyer

## 2016-06-10 ENCOUNTER — Encounter (HOSPITAL_COMMUNITY)
Admission: RE | Admit: 2016-06-10 | Discharge: 2016-06-10 | Disposition: A | Discharge status: Home / Self Care | Payer: Medicare Other | Source: Ambulatory Visit | Attending: Neurology | Admitting: Neurology

## 2016-06-10 DIAGNOSIS — F0390 Unspecified dementia without behavioral disturbance: Secondary | ICD-10-CM | POA: Insufficient documentation

## 2016-06-10 DIAGNOSIS — R413 Other amnesia: Secondary | ICD-10-CM | POA: Diagnosis not present

## 2016-06-10 DIAGNOSIS — F05 Delirium due to known physiological condition: Secondary | ICD-10-CM | POA: Insufficient documentation

## 2016-06-10 DIAGNOSIS — R410 Disorientation, unspecified: Secondary | ICD-10-CM

## 2016-06-10 MED ORDER — FLUDEOXYGLUCOSE F - 18 (FDG) INJECTION: 10.1000 | Freq: Once | INTRAVENOUS | Status: DC | PRN

## 2016-06-11 ENCOUNTER — Telehealth: Payer: Self-pay | Admitting: *Deleted

## 2016-06-11 ENCOUNTER — Telehealth: Payer: Self-pay | Admitting: Neurology

## 2016-06-11 NOTE — Telephone Encounter (Signed)
Dr Jaynee Eagles- Juluis Rainier, report ready.   Took incoming call from phone room from [place] at [facility]. She stated PET scan results ready to be viewed in EPIC. Advised I will notify Dr Jaynee Eagles. She verbalized understanding.

## 2016-06-11 NOTE — Telephone Encounter (Signed)
Called and left message at home to discuss, will try back at a later time   IMPRESSION: 1. Decreased relative cortical metabolism within the LEFT parietal lobe and temporal lobes and to lesser degree the RIGHT parietal lobe. Findings are consistent with Alzheimer's type pathology. 2. Decreased relative cortical metabolism within the LEFT occipital lobe. This finding can be associated with Lewy body dementia. Recommend clinical correlation with Parkinson's type dementia. 3. On the CT portion exam there is a mild volume loss within the LEFT parietal occipital lobes with mild dilatation of ventricle. Recommend correlation with prior MRI. Beta amyloid PET scan may add specificity. These results will be called to the ordering clinician or representative by the Radiologist Assistant, and communication documented in the PACS or zVision Dashboard.

## 2016-06-12 NOTE — Telephone Encounter (Signed)
Discussed with wife and patient, thanks

## 2016-06-18 DIAGNOSIS — H2012 Chronic iridocyclitis, left eye: Secondary | ICD-10-CM | POA: Diagnosis not present

## 2016-06-18 DIAGNOSIS — H35372 Puckering of macula, left eye: Secondary | ICD-10-CM | POA: Diagnosis not present

## 2016-06-18 DIAGNOSIS — H4040X0 Glaucoma secondary to eye inflammation, unspecified eye, stage unspecified: Secondary | ICD-10-CM | POA: Diagnosis not present

## 2016-06-18 DIAGNOSIS — H53461 Homonymous bilateral field defects, right side: Secondary | ICD-10-CM | POA: Diagnosis not present

## 2016-06-24 DIAGNOSIS — H401132 Primary open-angle glaucoma, bilateral, moderate stage: Secondary | ICD-10-CM | POA: Diagnosis not present

## 2016-06-24 DIAGNOSIS — G4733 Obstructive sleep apnea (adult) (pediatric): Secondary | ICD-10-CM | POA: Diagnosis not present

## 2016-06-25 DIAGNOSIS — J449 Chronic obstructive pulmonary disease, unspecified: Secondary | ICD-10-CM | POA: Diagnosis not present

## 2016-06-25 DIAGNOSIS — G4733 Obstructive sleep apnea (adult) (pediatric): Secondary | ICD-10-CM | POA: Diagnosis not present

## 2016-06-25 DIAGNOSIS — Z9989 Dependence on other enabling machines and devices: Secondary | ICD-10-CM | POA: Diagnosis not present

## 2016-06-25 DIAGNOSIS — Z23 Encounter for immunization: Secondary | ICD-10-CM | POA: Diagnosis not present

## 2016-06-25 DIAGNOSIS — R0602 Shortness of breath: Secondary | ICD-10-CM | POA: Diagnosis not present

## 2016-07-04 ENCOUNTER — Encounter: Payer: Self-pay | Admitting: Nurse Practitioner

## 2016-07-04 ENCOUNTER — Ambulatory Visit (INDEPENDENT_AMBULATORY_CARE_PROVIDER_SITE_OTHER): Payer: Medicare Other | Admitting: Nurse Practitioner

## 2016-07-04 VITALS — BP 120/64 | HR 46 | Ht 69.0 in | Wt 219.4 lb

## 2016-07-04 DIAGNOSIS — I119 Hypertensive heart disease without heart failure: Secondary | ICD-10-CM

## 2016-07-04 DIAGNOSIS — E785 Hyperlipidemia, unspecified: Secondary | ICD-10-CM

## 2016-07-04 DIAGNOSIS — I340 Nonrheumatic mitral (valve) insufficiency: Secondary | ICD-10-CM

## 2016-07-04 DIAGNOSIS — I4892 Unspecified atrial flutter: Secondary | ICD-10-CM | POA: Diagnosis not present

## 2016-07-04 NOTE — Patient Instructions (Signed)
Your physician has requested that you have an echocardiogram. Echocardiography is a painless test that uses sound waves to create images of your heart. It provides your doctor with information about the size and shape of your heart and how well your heart's chambers and valves are working. This procedure takes approximately one hour. There are no restrictions for this procedure. This will be done in February 2018.   Your physician recommends that you schedule a follow-up appointment in: MARCH 2018 with Dr Martinique.

## 2016-07-04 NOTE — Progress Notes (Signed)
Office Visit    Patient Name: Juan Johnson Date of Encounter: 07/04/2016  Primary Care Provider:  Curly Rim, MD Primary Cardiologist:  P. Martinique, MD   Chief Complaint    65 y/o ? with a h/o PAFlutter, HTN, HL, COPD, and prior CVA, who presents for f/u.  Past Medical History    Past Medical History:  Diagnosis Date  . Anemia   . Anxiety    claustrophobia  . Arthritis   . Complication of anesthesia    difficult to wake up after neck surgery  . COPD (chronic obstructive pulmonary disease) ([place])   . Depression   . Enlarged liver    20-25 years ago  . GERD (gastroesophageal reflux disease)   . Glaucoma   . Hemoptysis    a. 03/2016 in setting of PNA-->resolved.  . Hypertensive heart disease   . Mitral regurgitation    a. 09/2015 Echo: EF 55-60%, no rwma, mild to mod MR, PASP 31mmHg.  . Paroxysmal atrial flutter ([place])    a. Noted 09/2015 following back surgery-->Eliquis (CHA2DS2VASc = 4);  b. 03/2016 Recurrent PAFlutter in setting of PNA-->converted spont.  . 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 Pneumonia 04/2015   "bronchial" pneumonia   . Retinal tear of left eye   . Sleep apnea    does use cpap  . Stroke [facility] [place]) ? 05/2014   peripheral vision loss   Past Surgical History:  Procedure Laterality Date  . CARDIAC CATHETERIZATION  2003  . COLONOSCOPY    . EYE SURGERY  10/2007  . LEG SURGERY     AT AGE 52  . LUMBAR FUSION  09/27/2015  . NECK SURGERY  06/29/2010  . SHOULDER SURGERY  09/24/2011  . TONSILLECTOMY     AT AGE 4  . VASECTOMY      Allergies  Allergies  Allergen Reactions  . Crestor [Rosuvastatin] Other (See Comments)    Cramps and muscle weakness  . Simvastatin Other (See Comments)    Cramps and muscle weakness  . Ace Inhibitors Cough    History of Present Illness    65 year old male with a history of paroxysmal atrial fibrillation/flutter, hypertension, COPD, prior stroke, sleep apnea, anxiety, depression, Mild to  moderate mitral regurgitation, and hyperlipidemia.  He was seen twice over the summer related to pneumonia and hemoptysis. In that setting, he also developed recurrent A. fib flutter. When he was seen back a week later, he was back in sinus rhythm. On antibiotics, pneumonia cleared as did hemoptysis. Since then, he has done well. He tries to remain active and walks some. He has not been having any chest pain, dyspnea, PND, orthopnea, dizziness, syncope, edema, palpitations, or early satiety. He checks his blood pressure at home and says that it has been stable.  Home Medications    Prior to Admission medications   Medication Sig Start Date End Date Taking? Authorizing Provider  acetaminophen (TYLENOL) 500 MG tablet Take 500 mg by mouth every 4 (four) hours as needed for mild pain or moderate pain (Takes 2 every 4- 6 hours).    Yes Historical Provider, MD  albuterol (PROVENTIL HFA;VENTOLIN HFA) 108 (90 Base) MCG/ACT inhaler Inhale into the lungs every 6 (six) hours as needed for wheezing or shortness of breath.   Yes Historical Provider, MD  ALPRAZolam (XANAX) 0.5 MG tablet Take 1-2 30 minutes before MRi do not drive. May repeat if needed. 02/15/16  Yes Melvenia Beam, MD  apixaban (ELIQUIS) 5 MG TABS tablet Take 1 tablet (5 mg total) by mouth 2 (two) times daily. 01/11/16  Yes Peter M Martinique, MD  BREO ELLIPTA 100-25 MCG/INH AEPB Take 1 puff by mouth daily. 02/13/16  Yes Historical Provider, MD  Cetirizine HCl (ZYRTEC PO) Take 10 mg by mouth daily.    Yes Historical Provider, MD  dextromethorphan-guaiFENesin (MUCINEX DM) 30-600 MG 12hr tablet Take 1 tablet by mouth 2 (two) times daily as needed for cough.    Yes Historical Provider, MD  diltiazem (CARDIZEM CD) 240 MG 24 hr capsule Take 1 capsule (240 mg total) by mouth daily. 01/30/16  Yes Peter M Martinique, MD  donepezil (ARICEPT) 10 MG tablet Take 0.5 tablets by mouth daily. 05/29/16  Yes Historical Provider, MD  ezetimibe (ZETIA) 10 MG tablet Take 10 mg by  mouth daily.   Yes Historical Provider, MD  ketorolac (ACULAR) 0.5 % ophthalmic solution Place 1 drop into the left eye 4 (four) times daily.  08/22/14  Yes Historical Provider, MD  latanoprost (XALATAN) 0.005 % ophthalmic solution Place 1 drop into both eyes at bedtime.    Yes Historical Provider, MD  metoprolol tartrate (LOPRESSOR) 25 MG tablet Take 0.5 tablets (12.5 mg total) by mouth 2 (two) times daily. 02/15/16  Yes Peter M Martinique, MD  montelukast (SINGULAIR) 10 MG tablet Take 10 mg by mouth at bedtime.  03/09/15  Yes Historical Provider, MD  Multiple Vitamins-Minerals (MULTIVITAMIN WITH MINERALS) tablet Take 1 tablet by mouth daily.   Yes Historical Provider, MD  niacin (NIASPAN) 1000 MG CR tablet Take 1,000 mg by mouth daily. 03/09/15  Yes Historical Provider, MD  omeprazole (PRILOSEC) 20 MG capsule Take 20 mg by mouth 2 (two) times daily before a meal.  03/09/15  Yes Historical Provider, MD  sertraline (ZOLOFT) 50 MG tablet Take 1 tablet (50 mg total) by mouth daily. 06/03/16  Yes Melvenia Beam, MD  valsartan-hydrochlorothiazide (DIOVAN-HCT) 160-12.5 MG tablet Take 1 tablet by mouth daily. 01/29/16  Yes Peter M Martinique, MD    Review of Systems    As above he has been doing well.  He denies chest pain, palpitations, dyspnea, pnd, orthopnea, n, v, dizziness, syncope, edema, weight gain, or early satiety.  All other systems reviewed and are otherwise negative except as noted above.  Physical Exam    VS:  BP 120/64   Pulse (!) 46   Ht 5\' 9"  (1.753 m)   Wt 219 lb 6.4 oz (99.5 kg)   BMI 32.40 kg/m  , BMI Body mass index is 32.4 kg/m. GEN: Well nourished, well developed, in no acute distress.  HEENT: normal.  Neck: Supple, no JVD, carotid bruits, or masses. Cardiac: RRR, no murmurs, rubs, or gallops. No clubbing, cyanosis, edema.  Radials/DP/PT 2+ and equal bilaterally.  Respiratory:  Respirations regular and unlabored, clear to auscultation bilaterally. GI: Soft, nontender, nondistended, BS  + x 4. MS: no deformity or atrophy. Skin: warm and dry, no rash. Neuro:  Strength and sensation are intact. Psych: Normal affect.  Accessory Clinical Findings    ECG - Sinus bradycardia, 46, no acute ST or T changes.  Assessment & Plan    1.  Paroxysmal atrial fibrillation/flutter: This was first diagnosed in December 2016. He had recurrent A. fib flutter earlier this summer in the setting of pneumonia. This resolved with treatment of pneumonia. He remains in sinus rhythm today. He is bradycardic but asymptomatic and this has been stable. He remains on diltiazem and metoprolol. He  is anticoagulated with eliquis 5 mg twice a day. Though he had mild hemoptysis in the setting of pneumonia earlier this year, he has had no recurrence. He is also followed by pulmonology.  2. Hypertensive heart disease: Blood pressure is stable on beta blocker and diltiazem.  3. Hyperlipidemia: He is treated with Zetia. He had an LDL of 97 and 2011. Lipids have been managed by primary care.  4. Mild to moderate mitral regurgitation: He has not been having any symptoms. As previously planned, he will be due for follow-up echo in February with follow-up with Dr. Martinique in March.  5. Disposition: Follow-up echo in February. Follow with Dr. Martinique in March.  Murray Hodgkins, NP 07/04/2016, 9:15 AM

## 2016-07-30 ENCOUNTER — Ambulatory Visit: Payer: Medicare Other | Admitting: Neurology

## 2016-08-12 ENCOUNTER — Ambulatory Visit (INDEPENDENT_AMBULATORY_CARE_PROVIDER_SITE_OTHER): Payer: Medicare Other | Admitting: Neurology

## 2016-08-12 ENCOUNTER — Encounter: Payer: Self-pay | Admitting: Neurology

## 2016-08-12 VITALS — BP 120/57 | HR 47 | Ht 69.0 in | Wt 222.0 lb

## 2016-08-12 DIAGNOSIS — G308 Other Alzheimer's disease: Secondary | ICD-10-CM

## 2016-08-12 DIAGNOSIS — F0280 Dementia in other diseases classified elsewhere without behavioral disturbance: Secondary | ICD-10-CM | POA: Diagnosis not present

## 2016-08-12 MED ORDER — SERTRALINE HCL 100 MG PO TABS: 100.0000 mg | ORAL_TABLET | Freq: Every day | ORAL | 4 refills | Status: DC

## 2016-08-12 MED ORDER — DONEPEZIL HCL 5 MG PO TABS: 5.0000 mg | ORAL_TABLET | Freq: Every day | ORAL | 4 refills | Status: DC

## 2016-08-12 NOTE — Patient Instructions (Signed)
Remember to drink plenty of fluid, eat healthy meals and do not skip any meals. Try to eat protein with a every meal and eat a healthy snack such as fruit or nuts in between meals. Try to keep a regular sleep-wake schedule and try to exercise daily, particularly in the form of walking, 20-30 minutes a day, if you can.   As far as your medications are concerned, I would like to suggest: Continue Aricept 5mg  daily Increase Zoloft to 100mg  daily At next appointment would like to add namenda (memantine) for memory changes  I would like to see you back in 4-6 months, sooner if we need to. Please call us with any interim questions, concerns, problems, updates or refill requests.   Our phone number is [phone]. We also have an after hours call service for urgent matters and there is a physician on-call for urgent questions. For any emergencies you know to call 911 or go to the nearest emergency room
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
## 2016-09-03 DIAGNOSIS — I10 Essential (primary) hypertension: Secondary | ICD-10-CM | POA: Diagnosis not present

## 2016-09-03 DIAGNOSIS — K219 Gastro-esophageal reflux disease without esophagitis: Secondary | ICD-10-CM | POA: Diagnosis not present

## 2016-09-03 DIAGNOSIS — E784 Other hyperlipidemia: Secondary | ICD-10-CM | POA: Diagnosis not present

## 2016-09-12 DIAGNOSIS — Z0000 Encounter for general adult medical examination without abnormal findings: Secondary | ICD-10-CM | POA: Diagnosis not present

## 2016-09-12 DIAGNOSIS — Z1159 Encounter for screening for other viral diseases: Secondary | ICD-10-CM | POA: Diagnosis not present

## 2016-09-12 DIAGNOSIS — Z1211 Encounter for screening for malignant neoplasm of colon: Secondary | ICD-10-CM | POA: Diagnosis not present

## 2016-09-12 DIAGNOSIS — J439 Emphysema, unspecified: Secondary | ICD-10-CM | POA: Diagnosis not present

## 2016-09-12 DIAGNOSIS — N5082 Scrotal pain: Secondary | ICD-10-CM | POA: Diagnosis not present

## 2016-09-12 DIAGNOSIS — K219 Gastro-esophageal reflux disease without esophagitis: Secondary | ICD-10-CM | POA: Diagnosis not present

## 2016-09-12 DIAGNOSIS — E784 Other hyperlipidemia: Secondary | ICD-10-CM | POA: Diagnosis not present

## 2016-09-12 DIAGNOSIS — I10 Essential (primary) hypertension: Secondary | ICD-10-CM | POA: Diagnosis not present

## 2016-09-26 DIAGNOSIS — N5082 Scrotal pain: Secondary | ICD-10-CM | POA: Diagnosis not present

## 2016-09-26 DIAGNOSIS — R1032 Left lower quadrant pain: Secondary | ICD-10-CM | POA: Diagnosis not present

## 2016-09-26 DIAGNOSIS — N433 Hydrocele, unspecified: Secondary | ICD-10-CM | POA: Diagnosis not present

## 2016-09-26 DIAGNOSIS — I861 Scrotal varices: Secondary | ICD-10-CM | POA: Diagnosis not present

## 2016-09-27 DIAGNOSIS — J449 Chronic obstructive pulmonary disease, unspecified: Secondary | ICD-10-CM | POA: Diagnosis not present

## 2016-09-27 DIAGNOSIS — R0602 Shortness of breath: Secondary | ICD-10-CM | POA: Diagnosis not present

## 2016-09-27 DIAGNOSIS — I10 Essential (primary) hypertension: Secondary | ICD-10-CM | POA: Diagnosis not present

## 2016-09-27 DIAGNOSIS — Z9989 Dependence on other enabling machines and devices: Secondary | ICD-10-CM | POA: Diagnosis not present

## 2016-09-27 DIAGNOSIS — G4733 Obstructive sleep apnea (adult) (pediatric): Secondary | ICD-10-CM | POA: Diagnosis not present

## 2016-10-14 HISTORY — PX: EYE SURGERY: SHX253

## 2016-10-22 DIAGNOSIS — H401132 Primary open-angle glaucoma, bilateral, moderate stage: Secondary | ICD-10-CM | POA: Diagnosis not present

## 2016-11-20 DIAGNOSIS — H401132 Primary open-angle glaucoma, bilateral, moderate stage: Secondary | ICD-10-CM | POA: Diagnosis not present

## 2016-11-26 DIAGNOSIS — H02834 Dermatochalasis of left upper eyelid: Secondary | ICD-10-CM | POA: Diagnosis not present

## 2016-11-26 DIAGNOSIS — H0279 Other degenerative disorders of eyelid and periocular area: Secondary | ICD-10-CM | POA: Diagnosis not present

## 2016-11-26 DIAGNOSIS — H02423 Myogenic ptosis of bilateral eyelids: Secondary | ICD-10-CM | POA: Diagnosis not present

## 2016-11-26 DIAGNOSIS — H02413 Mechanical ptosis of bilateral eyelids: Secondary | ICD-10-CM | POA: Diagnosis not present

## 2016-11-26 DIAGNOSIS — H02831 Dermatochalasis of right upper eyelid: Secondary | ICD-10-CM | POA: Diagnosis not present

## 2016-11-26 DIAGNOSIS — H53489 Generalized contraction of visual field, unspecified eye: Secondary | ICD-10-CM | POA: Diagnosis not present

## 2016-12-05 ENCOUNTER — Telehealth: Payer: Self-pay

## 2016-12-05 ENCOUNTER — Ambulatory Visit (HOSPITAL_COMMUNITY): Payer: Medicare Other | Attending: Cardiology

## 2016-12-05 ENCOUNTER — Other Ambulatory Visit: Payer: Self-pay

## 2016-12-05 DIAGNOSIS — I119 Hypertensive heart disease without heart failure: Secondary | ICD-10-CM | POA: Diagnosis not present

## 2016-12-05 DIAGNOSIS — I340 Nonrheumatic mitral (valve) insufficiency: Secondary | ICD-10-CM

## 2016-12-05 DIAGNOSIS — I4892 Unspecified atrial flutter: Secondary | ICD-10-CM | POA: Insufficient documentation

## 2016-12-05 NOTE — Telephone Encounter (Signed)
Received surgical clearance from Oculofacial Plastic Surgery.Dr.Jordan advised ok to hold Eliquis 2 days prior to surgery.Form faxed back to fax # [phone].

## 2016-12-12 ENCOUNTER — Telehealth: Payer: Self-pay | Admitting: Cardiology

## 2016-12-12 NOTE — Telephone Encounter (Signed)
He is clear for surgery. Would hold Eliquis 2 days prior then resume afterwards.  Peter Martinique MD, [facility]

## 2016-12-12 NOTE — Telephone Encounter (Signed)
New message       Pt having eyelid surgery on 12-27-16.  Wife is calling to see if Dr Martinique cleared pt for surgery?  Also, should pt take his cardiac medications day of surgery.  Wife states that the eye doctor would not tell pt anything about cardiac medications.  Please call

## 2016-12-17 NOTE — Telephone Encounter (Signed)
Spoke to patient 12/13/16 Dr.Jordan's recommendations given.

## 2016-12-19 ENCOUNTER — Telehealth: Payer: Self-pay | Admitting: Neurology

## 2016-12-19 NOTE — Telephone Encounter (Signed)
Patient is calling to discuss having surgery and what Dr. Jaynee Eagles thinks.

## 2016-12-21 NOTE — Telephone Encounter (Signed)
Spoke to patient he is getting eyelid surgery and I think that would be fine. Thanks. FYI

## 2016-12-23 DIAGNOSIS — G4733 Obstructive sleep apnea (adult) (pediatric): Secondary | ICD-10-CM | POA: Diagnosis not present

## 2016-12-23 DIAGNOSIS — Z01818 Encounter for other preprocedural examination: Secondary | ICD-10-CM | POA: Diagnosis not present

## 2016-12-23 DIAGNOSIS — Z9989 Dependence on other enabling machines and devices: Secondary | ICD-10-CM | POA: Diagnosis not present

## 2016-12-23 DIAGNOSIS — J449 Chronic obstructive pulmonary disease, unspecified: Secondary | ICD-10-CM | POA: Diagnosis not present

## 2016-12-23 DIAGNOSIS — I10 Essential (primary) hypertension: Secondary | ICD-10-CM | POA: Diagnosis not present

## 2016-12-25 ENCOUNTER — Telehealth: Payer: Self-pay | Admitting: Cardiology

## 2016-12-25 DIAGNOSIS — Z6835 Body mass index (BMI) 35.0-35.9, adult: Secondary | ICD-10-CM | POA: Diagnosis not present

## 2016-12-25 DIAGNOSIS — I10 Essential (primary) hypertension: Secondary | ICD-10-CM | POA: Diagnosis not present

## 2016-12-25 DIAGNOSIS — G8929 Other chronic pain: Secondary | ICD-10-CM | POA: Diagnosis not present

## 2016-12-25 DIAGNOSIS — M5442 Lumbago with sciatica, left side: Secondary | ICD-10-CM | POA: Diagnosis not present

## 2016-12-25 NOTE — Telephone Encounter (Signed)
New Message     Request for surgical clearance:  1. What type of surgery is being performed? Bi lateral brow & lid repair  2. When is this surgery scheduled? 12/27/16  3. Are there any medications that need to be held prior to surgery and how long? Pt stopped taking blood thinner   4. Name of physician performing surgery? Dr Dema Severin   5. What is your office phone and fax number? Fax [phone]    Needs letter stating pt can have procedure done and pt can stop taking blood thinners

## 2016-12-25 NOTE — Telephone Encounter (Signed)
Sent previous phone note to Dr Dema Severin via Epic   December 17, 2016  Luanna Salk, LPN     7:16 PM  Note    Spoke to patient 12/13/16 Dr.Jordan's recommendations given.         3:50 PM    Luanna Salk, LPN contacted Despina Arias (Emergency Contact)  December 12, 2016  Peter M Martinique, MD  to Luanna Salk, LPN    9:67 PM  Note    He is clear for surgery. Would hold Eliquis 2 days prior then resume afterwards.  Peter Martinique MD, [facility]          3:57 PM  Waylan Rocher, LPN routed this conversation to Peter M Martinique, MD . Luanna Salk, LPN      8:93 PM  Earnestine Mealing routed this conversation to [facility]  Earnestine Mealing    3:45 PM  Note    New message       Pt having eyelid surgery on 12-27-16.  Wife is calling to see if Dr Martinique cleared pt for surgery?  Also, should pt take his cardiac medications day of surgery.  Wife states that the eye doctor would not tell pt anything about cardiac medications.  Please call

## 2016-12-26 ENCOUNTER — Other Ambulatory Visit: Payer: Self-pay | Admitting: Neurosurgery

## 2016-12-26 DIAGNOSIS — M5442 Lumbago with sciatica, left side: Principal | ICD-10-CM

## 2016-12-26 DIAGNOSIS — G8929 Other chronic pain: Secondary | ICD-10-CM

## 2016-12-27 DIAGNOSIS — H02831 Dermatochalasis of right upper eyelid: Secondary | ICD-10-CM | POA: Diagnosis not present

## 2016-12-27 DIAGNOSIS — H02413 Mechanical ptosis of bilateral eyelids: Secondary | ICD-10-CM | POA: Diagnosis not present

## 2016-12-27 DIAGNOSIS — H02834 Dermatochalasis of left upper eyelid: Secondary | ICD-10-CM | POA: Diagnosis not present

## 2016-12-30 ENCOUNTER — Telehealth: Payer: Self-pay

## 2016-12-30 NOTE — Telephone Encounter (Signed)
Received request from [place].Dr.Jordan advised ok to hold Eliquis 2 days prior to lumbar myelogram.Request faxed back to fax # [phone].

## 2017-01-01 ENCOUNTER — Ambulatory Visit (INDEPENDENT_AMBULATORY_CARE_PROVIDER_SITE_OTHER): Payer: Medicare Other | Admitting: Cardiology

## 2017-01-01 ENCOUNTER — Encounter: Payer: Self-pay | Admitting: Cardiology

## 2017-01-01 VITALS — BP 122/58 | HR 89 | Ht 69.0 in | Wt 224.0 lb

## 2017-01-01 DIAGNOSIS — E7800 Pure hypercholesterolemia, unspecified: Secondary | ICD-10-CM

## 2017-01-01 DIAGNOSIS — I10 Essential (primary) hypertension: Secondary | ICD-10-CM | POA: Diagnosis not present

## 2017-01-01 DIAGNOSIS — I4892 Unspecified atrial flutter: Secondary | ICD-10-CM | POA: Diagnosis not present

## 2017-01-01 MED ORDER — VALSARTAN-HYDROCHLOROTHIAZIDE 160-12.5 MG PO TABS: 1.0000 | ORAL_TABLET | Freq: Every day | ORAL | 3 refills | Status: DC

## 2017-01-01 MED ORDER — METOPROLOL SUCCINATE ER 25 MG PO TB24: 25.0000 mg | ORAL_TABLET | Freq: Every day | ORAL | 3 refills | Status: DC

## 2017-01-01 MED ORDER — DILTIAZEM HCL ER COATED BEADS 240 MG PO CP24: 240.0000 mg | ORAL_CAPSULE | Freq: Every day | ORAL | 3 refills | Status: DC

## 2017-01-01 MED ORDER — APIXABAN 5 MG PO TABS: 5.0000 mg | ORAL_TABLET | Freq: Two times a day (BID) | ORAL | 3 refills | Status: DC

## 2017-01-01 NOTE — Patient Instructions (Addendum)
We will switch metoprolol to Toprol XL 25 mg once a day  Continue your other therapy  I will see you in 6 months

## 2017-01-01 NOTE — Progress Notes (Signed)
Office Visit    Patient Name: Juan Johnson Date of Encounter: 01/01/2017  Primary Care Provider:  Curly Rim, MD Primary Cardiologist:  P. Martinique, MD   Chief Complaint    66 y/o ? with a h/o PAFlutter, HTN, HL, COPD, and prior CVA, who presents for f/u.  Past Medical History    Past Medical History:  Diagnosis Date  . Anemia   . Anxiety    claustrophobia  . Arthritis   . Complication of anesthesia    difficult to wake up after neck surgery  . COPD (chronic obstructive pulmonary disease) ([place])   . Depression   . Enlarged liver    20-25 years ago  . GERD (gastroesophageal reflux disease)   . Glaucoma   . Hemoptysis    a. 03/2016 in setting of PNA-->resolved.  . Hypertensive heart disease   . Mitral regurgitation    a. 09/2015 Echo: EF 55-60%, no rwma, mild to mod MR, PASP 65mmHg.  . Paroxysmal atrial flutter ([place])    a. Noted 09/2015 following back surgery-->Eliquis (CHA2DS2VASc = 4);  b. 03/2016 Recurrent PAFlutter in setting of PNA-->converted spont.  . 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 Pneumonia 04/2015   "bronchial" pneumonia   . Retinal tear of left eye   . Sleep apnea    does use cpap  . Stroke [facility]) ? 05/2014   peripheral vision loss   Past Surgical History:  Procedure Laterality Date  . CARDIAC CATHETERIZATION  2003  . COLONOSCOPY    . EYE SURGERY  10/2007  . LEG SURGERY     AT AGE 27  . LUMBAR FUSION  09/27/2015  . NECK SURGERY  06/29/2010  . SHOULDER SURGERY  09/24/2011  . TONSILLECTOMY     AT AGE 59  . VASECTOMY      Allergies  Allergies  Allergen Reactions  . Crestor [Rosuvastatin] Other (See Comments)    Cramps and muscle weakness  . Simvastatin Other (See Comments)    Cramps and muscle weakness  . Ace Inhibitors Cough    History of Present Illness    66 year old male with a history of paroxysmal atrial fibrillation/flutter, hypertension, COPD, prior stroke, sleep apnea, anxiety, depression, Mild to  moderate mitral regurgitation, and hyperlipidemia.  He was seen twice over the past summer related to pneumonia and hemoptysis. In that setting, he also developed recurrent A. fib flutter. When he was seen back a week later, he was back in sinus rhythm. On antibiotics, pneumonia cleared as did hemoptysis. His beta blocker was reduced due to bradycardia.  His last Echo in February 2018 showed only mild MR and normal LV function.   On follow up today he states he is doing well. Does note some dyspnea which he relates to COPD and weight.  He tries to remain active and walks some. He has a "pinch" in his left hip that goes numb. He has not been having any chest pain,  PND, orthopnea, dizziness, syncope, edema, palpitations, or early satiety. He checks his blood pressure at home and says that it has been stable. He did have recent plastic surgery on his right eyelid/brow to help with vision.  Home Medications    Prior to Admission medications   Medication Sig Start Date End Date Taking? Authorizing Provider  acetaminophen (TYLENOL) 500 MG tablet Take 500 mg by mouth every 4 (four) hours as needed for mild pain or moderate pain (Takes 2 every 4-  6 hours).    Yes Historical Provider, MD  albuterol (PROVENTIL HFA;VENTOLIN HFA) 108 (90 Base) MCG/ACT inhaler Inhale into the lungs every 6 (six) hours as needed for wheezing or shortness of breath.   Yes Historical Provider, MD  ALPRAZolam (XANAX) 0.5 MG tablet Take 1-2 30 minutes before MRi do not drive. May repeat if needed. 02/15/16  Yes Melvenia Beam, MD  apixaban (ELIQUIS) 5 MG TABS tablet Take 1 tablet (5 mg total) by mouth 2 (two) times daily. 01/11/16  Yes Davion Meara M Martinique, MD  BREO ELLIPTA 100-25 MCG/INH AEPB Take 1 puff by mouth daily. 02/13/16  Yes Historical Provider, MD  Cetirizine HCl (ZYRTEC PO) Take 10 mg by mouth daily.    Yes Historical Provider, MD  dextromethorphan-guaiFENesin (MUCINEX DM) 30-600 MG 12hr tablet Take 1 tablet by mouth 2 (two) times  daily as needed for cough.    Yes Historical Provider, MD  diltiazem (CARDIZEM CD) 240 MG 24 hr capsule Take 1 capsule (240 mg total) by mouth daily. 01/30/16  Yes Antwain Caliendo M Martinique, MD  donepezil (ARICEPT) 10 MG tablet Take 0.5 tablets by mouth daily. 05/29/16  Yes Historical Provider, MD  ezetimibe (ZETIA) 10 MG tablet Take 10 mg by mouth daily.   Yes Historical Provider, MD  ketorolac (ACULAR) 0.5 % ophthalmic solution Place 1 drop into the left eye 4 (four) times daily.  08/22/14  Yes Historical Provider, MD  latanoprost (XALATAN) 0.005 % ophthalmic solution Place 1 drop into both eyes at bedtime.    Yes Historical Provider, MD  metoprolol tartrate (LOPRESSOR) 25 MG tablet Take 0.5 tablets (12.5 mg total) by mouth 2 (two) times daily. 02/15/16  Yes Hari Casaus M Martinique, MD  montelukast (SINGULAIR) 10 MG tablet Take 10 mg by mouth at bedtime.  03/09/15  Yes Historical Provider, MD  Multiple Vitamins-Minerals (MULTIVITAMIN WITH MINERALS) tablet Take 1 tablet by mouth daily.   Yes Historical Provider, MD  niacin (NIASPAN) 1000 MG CR tablet Take 1,000 mg by mouth daily. 03/09/15  Yes Historical Provider, MD  omeprazole (PRILOSEC) 20 MG capsule Take 20 mg by mouth 2 (two) times daily before a meal.  03/09/15  Yes Historical Provider, MD  sertraline (ZOLOFT) 50 MG tablet Take 1 tablet (50 mg total) by mouth daily. 06/03/16  Yes Melvenia Beam, MD  valsartan-hydrochlorothiazide (DIOVAN-HCT) 160-12.5 MG tablet Take 1 tablet by mouth daily. 01/29/16  Yes Angelgabriel Willmore M Martinique, MD    Review of Systems    As noted above.   All other systems reviewed and are otherwise negative except as noted above.  Physical Exam    VS:  BP (!) 122/58   Pulse 89   Ht 5\' 9"  (1.753 m)   Wt 224 lb (101.6 kg)   BMI 33.08 kg/m  , BMI Body mass index is 33.08 kg/m. GEN: Well nourished, well developed, in no acute distress.  HEENT: normal.  Neck: Supple, no JVD, carotid bruits, or masses. Cardiac: RRR, no murmurs, rubs, or gallops. No  clubbing, cyanosis, edema.  Radials/DP/PT 2+ and equal bilaterally.  Respiratory:  Respirations regular and unlabored, clear to auscultation bilaterally. GI: Soft, nontender, nondistended, BS + x 4. MS: no deformity or atrophy. Skin: warm and dry, no rash. Neuro:  Strength and sensation are intact. Psych: Normal affect.  Accessory Clinical Findings    Lab Results  Component Value Date   WBC 14.1 (H) 03/25/2016   HGB 11.2 (L) 03/25/2016   HCT 34.2 (L) 03/25/2016   PLT 279 03/25/2016  GLUCOSE 85 03/25/2016   CHOL  05/27/2010    172        ATP III CLASSIFICATION:  <200     mg/dL   Desirable  200-239  mg/dL   Borderline High  >=240    mg/dL   High          TRIG 206 (H) 05/27/2010   HDL 34 (L) 05/27/2010   LDLCALC  05/27/2010    97        Total Cholesterol/HDL:CHD Risk Coronary Heart Disease Risk Table                     Men   Women  1/2 Average Risk   3.4   3.3  Average Risk       5.0   4.4  2 X Average Risk   9.6   7.1  3 X Average Risk  23.4   11.0        Use the calculated Patient Ratio above and the CHD Risk Table to determine the patient's CHD Risk.        ATP III CLASSIFICATION (LDL):  <100     mg/dL   Optimal  100-129  mg/dL   Near or Above                    Optimal  130-159  mg/dL   Borderline  160-189  mg/dL   High  >190     mg/dL   Very High   ALT 24 06/24/2014   AST 25 06/24/2014   NA 137 03/25/2016   K 3.8 03/25/2016   CL 99 03/25/2016   CREATININE 0.99 03/25/2016   BUN 17 03/25/2016   CO2 22 03/25/2016   TSH 0.651 10/03/2015   INR 1.06 10/03/2015   HGBA1C 6.0 (H) 06/24/2014   Labs dated 09/12/16: glucose 103, UA normal. Hgb 12.0. Normal CMET. Hep C antibody neg. Cholesterol 200, triglycerides 131, HDL 42, LDL 132.    Echo 12/05/16:Study Conclusions  - Left ventricle: The cavity size was mildly dilated. Wall   thickness was normal. Systolic function was normal. The estimated   ejection fraction was in the range of 50% to 55%. Wall motion  was   normal; there were no regional wall motion abnormalities. Left   ventricular diastolic function parameters were normal. - Mitral valve: There was mild regurgitation. - Left atrium: The atrium was mildly dilated. - Pulmonary arteries: Systolic pressure was mildly increased. PA   peak pressure: 32 mm Hg (S).  Impressions:  - Normal LV systolic and diastolic function; mild LVE; mild MR;   mild LAE; trace TR; mildly elevated pulmonary pressure.   Assessment & Plan    1.  Paroxysmal atrial fibrillation/flutter: This was first diagnosed in December 2016. He had recurrent A. fib flutter earlier this past summer in the setting of pneumonia. This resolved with treatment of pneumonia. He remains in sinus rhythm today. Will continue rate control strategy with metoprolol and cardizem. Switch to Toprol XL 25 mg daily. Continue Eliquis for anticoagulation. Follow up in 6 months.  2. Hypertensive heart disease: Blood pressure is stable on beta blocker and diltiazem.  3. Hyperlipidemia: He is treated with Zetia. He had an LDL of 132 in November. Lipids have been managed by primary care.  4. Mild  mitral regurgitation by recent Echo: He has not been having any symptoms.   5. Disposition: follow up in 6 months  Jun Osment Martinique, [place] 01/01/2017, 9:30 AM

## 2017-01-09 ENCOUNTER — Ambulatory Visit
Admission: RE | Admit: 2017-01-09 | Discharge: 2017-01-09 | Disposition: A | Discharge status: Home / Self Care | Payer: Medicare Other | Source: Ambulatory Visit | Attending: Neurosurgery | Admitting: Neurosurgery

## 2017-01-09 DIAGNOSIS — G8929 Other chronic pain: Secondary | ICD-10-CM

## 2017-01-09 DIAGNOSIS — M5442 Lumbago with sciatica, left side: Principal | ICD-10-CM

## 2017-01-09 DIAGNOSIS — M48061 Spinal stenosis, lumbar region without neurogenic claudication: Secondary | ICD-10-CM | POA: Diagnosis not present

## 2017-01-09 MED ORDER — DIAZEPAM 5 MG PO TABS
5.0000 mg | ORAL_TABLET | Freq: Once | ORAL | Status: AC
  Administered 2017-01-09: 5 mg via ORAL

## 2017-01-09 MED ORDER — IOPAMIDOL (ISOVUE-M 200) INJECTION 41%
15.0000 mL | Freq: Once | INTRAMUSCULAR | Status: AC
  Administered 2017-01-09: 15 mL via INTRATHECAL

## 2017-01-09 MED ORDER — ONDANSETRON HCL 4 MG/2ML IJ SOLN: 4.0000 mg | Freq: Four times a day (QID) | INTRAMUSCULAR | Status: DC | PRN

## 2017-01-09 NOTE — Discharge Instructions (Signed)
Myelogram Discharge Instructions  1. Go home and rest quietly for the next 24 hours.  It is important to lie flat for the next 24 hours.  Get up only to go to the restroom.  You may lie in the bed or on a couch on your back, your stomach, your left side or your right side.  You may have one pillow under your head.  You may have pillows between your knees while you are on your side or under your knees while you are on your back.  2. DO NOT drive today.  Recline the seat as far back as it will go, while still wearing your seat belt, on the way home.  3. You may get up to go to the bathroom as needed.  You may sit up for 10 minutes to eat.  You may resume your normal diet and medications unless otherwise indicated.  Drink lots of extra fluids today and tomorrow.  4. The incidence of headache, nausea, or vomiting is about 5% (one in 20 patients).  If you develop a headache, lie flat and drink plenty of fluids until the headache goes away.  Caffeinated beverages may be helpful.  If you develop severe nausea and vomiting or a headache that does not go away with flat bed rest, call [phone].  5. You may resume normal activities after your 24 hours of bed rest is over; however, do not exert yourself strongly or do any heavy lifting tomorrow. If when you get up you have a headache when standing, go back to bed and force fluids for another 24 hours.  6. Call your physician for a follow-up appointment.  The results of your myelogram will be sent directly to your physician by the following day.  7. If you have any questions or if complications develop after you arrive home, please call [phone].  Discharge instructions have been explained to the patient.  The patient, or the person responsible for the patient, fully understands these instructions.     MAY RESUME ELIQUIS TODAY.      May resume Zoloft on January 10, 2017, after 9:30 am.

## 2017-01-09 NOTE — Progress Notes (Signed)
Pt states he has been off Zoloft and Eliquis for the past 2 days.

## 2017-01-22 DIAGNOSIS — M5442 Lumbago with sciatica, left side: Secondary | ICD-10-CM | POA: Diagnosis not present

## 2017-01-22 DIAGNOSIS — Z6834 Body mass index (BMI) 34.0-34.9, adult: Secondary | ICD-10-CM | POA: Diagnosis not present

## 2017-01-22 DIAGNOSIS — I10 Essential (primary) hypertension: Secondary | ICD-10-CM | POA: Diagnosis not present

## 2017-01-27 ENCOUNTER — Other Ambulatory Visit: Payer: Self-pay | Admitting: Neurosurgery

## 2017-02-04 ENCOUNTER — Encounter (HOSPITAL_COMMUNITY)
Admission: RE | Admit: 2017-02-04 | Discharge: 2017-02-04 | Disposition: A | Discharge status: Home / Self Care | Payer: Medicare Other | Source: Ambulatory Visit | Attending: Neurosurgery | Admitting: Neurosurgery

## 2017-02-04 ENCOUNTER — Encounter: Payer: Self-pay | Admitting: Neurology

## 2017-02-04 ENCOUNTER — Encounter (HOSPITAL_COMMUNITY): Payer: Self-pay

## 2017-02-04 DIAGNOSIS — Z8673 Personal history of transient ischemic attack (TIA), and cerebral infarction without residual deficits: Secondary | ICD-10-CM | POA: Diagnosis not present

## 2017-02-04 DIAGNOSIS — Z01812 Encounter for preprocedural laboratory examination: Secondary | ICD-10-CM | POA: Insufficient documentation

## 2017-02-04 DIAGNOSIS — I480 Paroxysmal atrial fibrillation: Secondary | ICD-10-CM | POA: Insufficient documentation

## 2017-02-04 DIAGNOSIS — Z87891 Personal history of nicotine dependence: Secondary | ICD-10-CM | POA: Diagnosis not present

## 2017-02-04 DIAGNOSIS — J449 Chronic obstructive pulmonary disease, unspecified: Secondary | ICD-10-CM | POA: Diagnosis not present

## 2017-02-04 DIAGNOSIS — Z79899 Other long term (current) drug therapy: Secondary | ICD-10-CM | POA: Diagnosis not present

## 2017-02-04 DIAGNOSIS — G4733 Obstructive sleep apnea (adult) (pediatric): Secondary | ICD-10-CM | POA: Diagnosis not present

## 2017-02-04 DIAGNOSIS — I10 Essential (primary) hypertension: Secondary | ICD-10-CM | POA: Insufficient documentation

## 2017-02-04 DIAGNOSIS — K219 Gastro-esophageal reflux disease without esophagitis: Secondary | ICD-10-CM | POA: Insufficient documentation

## 2017-02-04 DIAGNOSIS — E785 Hyperlipidemia, unspecified: Secondary | ICD-10-CM | POA: Diagnosis not present

## 2017-02-04 DIAGNOSIS — I4892 Unspecified atrial flutter: Secondary | ICD-10-CM | POA: Diagnosis not present

## 2017-02-04 HISTORY — DX: Unspecified dementia, unspecified severity, without behavioral disturbance, psychotic disturbance, mood disturbance, and anxiety: F03.90

## 2017-02-04 LAB — BASIC METABOLIC PANEL
Anion gap: 6 (ref 5–15)
BUN: 12 mg/dL (ref 6–20)
CALCIUM: 9.3 mg/dL (ref 8.9–10.3)
CO2: 27 mmol/L (ref 22–32)
CREATININE: 0.8 mg/dL (ref 0.61–1.24)
Chloride: 106 mmol/L (ref 101–111)
GFR calc Af Amer: >60 mL/min (ref >60)
GFR calc non Af Amer: >60 mL/min (ref >60)
GLUCOSE: 98 mg/dL (ref 65–99)
Potassium: 3.8 mmol/L (ref 3.5–5.1)
SODIUM: 139 mmol/L (ref 135–145)

## 2017-02-04 LAB — CBC
HCT: 35.1 % — ABNORMAL LOW (ref 39.0–52.0)
Hemoglobin: 11.1 g/dL — ABNORMAL LOW (ref 13.0–17.0)
MCH: 26.4 pg (ref 26.0–34.0)
MCHC: 31.6 g/dL (ref 30.0–36.0)
MCV: 83.4 fL (ref 78.0–100.0)
PLATELETS: 278 10*3/uL (ref 150–400)
RBC: 4.21 MIL/uL — ABNORMAL LOW (ref 4.22–5.81)
RDW: 14.1 % (ref 11.5–15.5)
WBC: 11.6 10*3/uL — ABNORMAL HIGH (ref 4.0–10.5)

## 2017-02-04 LAB — SURGICAL PCR SCREEN
MRSA, PCR: NEGATIVE
Staphylococcus aureus: NEGATIVE

## 2017-02-04 NOTE — Pre-Procedure Instructions (Signed)
    Juan Johnson  02/04/2017      [facility] [address] [place] - [address] [place] [address] Phone: [phone] Fax: [phone]    Your procedure is scheduled on May 4.  Report to [facility] Admitting at 11:30 A.M.  Call this number if you have problems the morning of surgery:  [phone]   Remember:  Do not eat food or drink liquids after midnight.  Take these medicines the morning of surgery with A SIP OF WATER :albuterol (PROVENTIL HFA;VENTOLIN - bring with you, BREO ELLIPTA , cetirizine (ZYRTEC), diltiazem (CARDIZEM CD), donepezil (ARICEPT), metoprolol succinate (TOPROL XL), omeprazole (PRILOSEC) , sertraline (ZOLOFT)  STOP aspirin, herbal medication, advil, aleve, ibuprofen STOP elequis 48-72 hrs prior to surgery as directed by Dr Martinique   Do not wear jewelry, make-up or nail polish.  Do not wear lotions, powders, or perfumes, or deoderant.  Do not shave 48 hours prior to surgery.  Men may shave face and neck.  Do not bring valuables to the hospital.  [facility] is not responsible for any belongings or valuables.  Contacts, dentures or bridgework may not be worn into surgery.  Leave your suitcase in the car.  After surgery it may be brought to your room.  For patients admitted to the hospital, discharge time will be determined by your treatment team.  Patients discharged the day of surgery will not be allowed to drive home.   Name and phone number of your driver:    Special instructions:  Preparing for surgery  Please read over the following fact sheets that you were given. Pain Booklet and Surgical Site Infection Prevention

## 2017-02-04 NOTE — Progress Notes (Signed)
Pulmonologist Dr Michela Pitcher- [facility] Cardiologist Dr Peter Martinique PCP- [place]

## 2017-02-05 ENCOUNTER — Telehealth: Payer: Self-pay

## 2017-02-05 NOTE — Telephone Encounter (Signed)
Received clearance from [facility].Dr.Jordan advised ok to hold Eliquis 48 to 72 hours prior to procedure.Clearance faxed back on 01/28/17 to fax # [phone].

## 2017-02-06 ENCOUNTER — Encounter (HOSPITAL_COMMUNITY): Payer: Self-pay

## 2017-02-06 NOTE — Telephone Encounter (Signed)
Called pt's wife. Says that pt gets agitated and she doesn't want to talk in front of pt at his appt next wk. Wanted to let Dr. Jaynee Eagles know that pt feels that he's getting better because he's taking the Aricept. However, family feels that his disease is progressing. Says that he gets irate and argumentative over every little thing. He had eye surgery and is under the impression that he will now be able to drive (although Dr. Jaynee Eagles has previously recommended that he not drive). Also has back surgery scheduled 02/14/17 and thinks that he'll be "a new man" after surg. Family wanted MD to be aware of issues prior to next week's appt so that he doesn't become agitated w/ discussing further during the visit.

## 2017-02-06 NOTE — Progress Notes (Signed)
Anesthesia Chart Review:  Pt is a 66 year old male scheduled for exploration of fusion L3-5, possible removal of hardware L3-4, PLIF L5-S1 on 02/14/2017 with Consuella Lose, MD.   - PCP is Ledora Bottcher, MD - Cardiologist is Peter Martinique, MD, who is aware of upcoming surgery. Last office visit 01/01/17 - Neurologist is Sarina Ill, MD - Pulmonologist is Dionne Milo, MD (notes in care everywhere). Last office visit 12/23/16 with Rinaldo Cloud, NP.   PMH includes:  PAF, atrial flutter,  HTN, hyperlipidemia, COPD, stroke (2015), OSA, glaucoma, dementia, GERD. Former smoker. BMI 33.5. S/p PLIF 09/27/15.   Medications include: Albuterol, Eliquis, Breo Ellipta, diltiazem, Aricept, zetia, metoprolol, Prilosec, T Malone, valsartan-HCTZ. Pt to stop eliquis 02/11/17.   Preoperative labs reviewed.    CXR 03/25/16: Consolidation consistent with pneumonia posterior left base. Lungs elsewhere clear. Cardiac silhouette within normal limits. - Note this was treated with antibiotics.   EKG 07/04/16: Sinus bradycardia (46 bpm).  Echo 12/05/16:  - Left ventricle: The cavity size was mildly dilated. Wall thickness was normal. Systolic function was normal. The estimated ejection fraction was in the range of 50% to 55%. Wall motion was normal; there were no regional wall motion abnormalities. Left ventricular diastolic function parameters were normal. - Mitral valve: There was mild regurgitation. - Left atrium: The atrium was mildly dilated. - Pulmonary arteries: Systolic pressure was mildly increased. PA peak pressure: 32 mm Hg (S).  If no changes, I anticipate pt can proceed with surgery as scheduled.   Willeen Cass, FNP-BC [facility]/Anesthesiology Phone: [phone] 02/06/2017 3:34 PM

## 2017-02-10 ENCOUNTER — Encounter: Payer: Self-pay | Admitting: Neurology

## 2017-02-10 ENCOUNTER — Ambulatory Visit (INDEPENDENT_AMBULATORY_CARE_PROVIDER_SITE_OTHER): Payer: Medicare Other | Admitting: Neurology

## 2017-02-10 VITALS — BP 157/68 | HR 49 | Ht 68.5 in | Wt 225.0 lb

## 2017-02-10 DIAGNOSIS — G300 Alzheimer's disease with early onset: Secondary | ICD-10-CM | POA: Diagnosis not present

## 2017-02-10 DIAGNOSIS — F0281 Dementia in other diseases classified elsewhere with behavioral disturbance: Secondary | ICD-10-CM | POA: Diagnosis not present

## 2017-02-10 MED ORDER — MEMANTINE HCL 10 MG PO TABS: ORAL_TABLET | ORAL | 11 refills | Status: DC

## 2017-02-10 NOTE — Patient Instructions (Addendum)
Remember to drink plenty of fluid, eat healthy meals and do not skip any meals. Try to eat protein with a every meal and eat a healthy snack such as fruit or nuts in between meals. Try to keep a regular sleep-wake schedule and try to exercise daily, particularly in the form of walking, 20-30 minutes a day, if you can.   As far as your medications are concerned, I would like to suggest: Stat Namenda(Memantine) 10mg  daily for 2 weeks then can increase to twice daily if tolerated.  I would like to see you back in 6 months, sooner if we need to. Please call us with any interim questions, concerns, problems, updates or refill requests.   Our phone number is [phone]. We also have an after hours call service for urgent matters and there is a physician on-call for urgent questions. For any emergencies you know to call 911 or go to the nearest emergency room  Memantine Tablets What is this medicine? MEMANTINE (MEM an teen) is used to treat dementia caused by Alzheimer's disease. This medicine may be used for other purposes; ask your health care provider or pharmacist if you have questions. COMMON BRAND NAME(S): Namenda What should I tell my health care provider before I take this medicine? They need to know if you have any of these conditions: -difficulty passing urine -kidney disease -liver disease -seizures -an unusual or allergic reaction to memantine, other medicines, foods, dyes, or preservatives -pregnant or trying to get pregnant -breast-feeding How should I use this medicine? Take this medicine by mouth with a glass of water. Follow the directions on the prescription label. You may take this medicine with or without food. Take your doses at regular intervals. Do not take your medicine more often than directed. Continue to take your medicine even if you feel better. Do not stop taking except on the advice of your doctor or health care professional. Talk to your pediatrician regarding the  use of this medicine in children. Special care may be needed. Overdosage: If you think you have taken too much of this medicine contact a poison control center or emergency room at once. NOTE: This medicine is only for you. Do not share this medicine with others. What if I miss a dose? If you miss a dose, take it as soon as you can. If it is almost time for your next dose, take only that dose. Do not take double or extra doses. If you do not take your medicine for several days, contact your health care provider. Your dose may need to be changed. What may interact with this medicine? -acetazolamide -amantadine -cimetidine -dextromethorphan -dofetilide -hydrochlorothiazide -ketamine -metformin -methazolamide -quinidine -ranitidine -sodium bicarbonate -triamterene This list may not describe all possible interactions. Give your health care provider a list of all the medicines, herbs, non-prescription drugs, or dietary supplements you use. Also tell them if you smoke, drink alcohol, or use illegal drugs. Some items may interact with your medicine. What should I watch for while using this medicine? Visit your doctor or health care professional for regular checks on your progress. Check with your doctor or health care professional if there is no improvement in your symptoms or if they get worse. You may get drowsy or dizzy. Do not drive, use machinery, or do anything that needs mental alertness until you know how this drug affects you. Do not stand or sit up quickly, especially if you are an older patient. This reduces the risk of dizzy or fainting spells.  Alcohol can make you more drowsy and dizzy. Avoid alcoholic drinks. What side effects may I notice from receiving this medicine? Side effects that you should report to your doctor or health care professional as soon as possible: -allergic reactions like skin rash, itching or hives, swelling of the face, lips, or tongue -agitation or a feeling of  restlessness -depressed mood -dizziness -hallucinations -redness, blistering, peeling or loosening of the skin, including inside the mouth -seizures -vomiting Side effects that usually do not require medical attention (report to your doctor or health care professional if they continue or are bothersome): -constipation -diarrhea -headache -nausea -trouble sleeping This list may not describe all possible side effects. Call your doctor for medical advice about side effects. You may report side effects to FDA at 1-800-FDA-1088. Where should I keep my medicine? Keep out of the reach of children. Store at room temperature between 15 degrees and 30 degrees C (59 degrees and 86 degrees F). Throw away any unused medicine after the expiration date. NOTE: This sheet is a summary. It may not cover all possible information. If you have questions about this medicine, talk to your doctor, pharmacist, or health care provider.  2018 Elsevier/Gold Standard (2013-07-19 14:10:42)
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
## 2017-02-14 ENCOUNTER — Inpatient Hospital Stay (HOSPITAL_COMMUNITY): Payer: Medicare Other

## 2017-02-14 ENCOUNTER — Encounter (HOSPITAL_COMMUNITY): Payer: Self-pay | Admitting: *Deleted

## 2017-02-14 ENCOUNTER — Inpatient Hospital Stay (HOSPITAL_COMMUNITY): Payer: Medicare Other | Admitting: Emergency Medicine

## 2017-02-14 ENCOUNTER — Inpatient Hospital Stay (HOSPITAL_COMMUNITY)
Admission: RE | Admit: 2017-02-14 | Discharge: 2017-02-15 | DRG: 454 | Disposition: A | Discharge status: Home / Self Care | Payer: Medicare Other | Source: Ambulatory Visit | Attending: Neurosurgery | Admitting: Neurosurgery

## 2017-02-14 ENCOUNTER — Encounter (HOSPITAL_COMMUNITY)
Admission: RE | Disposition: A | Discharge status: Home / Self Care | Payer: Self-pay | Source: Ambulatory Visit | Attending: Neurosurgery

## 2017-02-14 ENCOUNTER — Other Ambulatory Visit (HOSPITAL_COMMUNITY): Payer: Self-pay | Admitting: *Deleted

## 2017-02-14 ENCOUNTER — Inpatient Hospital Stay (HOSPITAL_COMMUNITY): Payer: Medicare Other | Admitting: Anesthesiology

## 2017-02-14 DIAGNOSIS — Z7901 Long term (current) use of anticoagulants: Secondary | ICD-10-CM | POA: Diagnosis not present

## 2017-02-14 DIAGNOSIS — Z981 Arthrodesis status: Secondary | ICD-10-CM

## 2017-02-14 DIAGNOSIS — F419 Anxiety disorder, unspecified: Secondary | ICD-10-CM | POA: Diagnosis present

## 2017-02-14 DIAGNOSIS — M48061 Spinal stenosis, lumbar region without neurogenic claudication: Secondary | ICD-10-CM | POA: Diagnosis present

## 2017-02-14 DIAGNOSIS — Z79899 Other long term (current) drug therapy: Secondary | ICD-10-CM | POA: Diagnosis not present

## 2017-02-14 DIAGNOSIS — H409 Unspecified glaucoma: Secondary | ICD-10-CM | POA: Diagnosis present

## 2017-02-14 DIAGNOSIS — M4317 Spondylolisthesis, lumbosacral region: Principal | ICD-10-CM

## 2017-02-14 DIAGNOSIS — M545 Low back pain: Secondary | ICD-10-CM | POA: Diagnosis present

## 2017-02-14 DIAGNOSIS — H547 Unspecified visual loss: Secondary | ICD-10-CM | POA: Diagnosis present

## 2017-02-14 DIAGNOSIS — G4730 Sleep apnea, unspecified: Secondary | ICD-10-CM | POA: Diagnosis present

## 2017-02-14 DIAGNOSIS — F40240 Claustrophobia: Secondary | ICD-10-CM | POA: Diagnosis not present

## 2017-02-14 DIAGNOSIS — M5432 Sciatica, left side: Secondary | ICD-10-CM | POA: Diagnosis not present

## 2017-02-14 DIAGNOSIS — Z8673 Personal history of transient ischemic attack (TIA), and cerebral infarction without residual deficits: Secondary | ICD-10-CM | POA: Diagnosis not present

## 2017-02-14 DIAGNOSIS — Z419 Encounter for procedure for purposes other than remedying health state, unspecified: Secondary | ICD-10-CM

## 2017-02-14 DIAGNOSIS — I340 Nonrheumatic mitral (valve) insufficiency: Secondary | ICD-10-CM | POA: Diagnosis present

## 2017-02-14 DIAGNOSIS — M5417 Radiculopathy, lumbosacral region: Secondary | ICD-10-CM | POA: Diagnosis present

## 2017-02-14 DIAGNOSIS — M47816 Spondylosis without myelopathy or radiculopathy, lumbar region: Secondary | ICD-10-CM | POA: Diagnosis present

## 2017-02-14 DIAGNOSIS — F0390 Unspecified dementia without behavioral disturbance: Secondary | ICD-10-CM | POA: Diagnosis present

## 2017-02-14 DIAGNOSIS — K219 Gastro-esophageal reflux disease without esophagitis: Secondary | ICD-10-CM | POA: Diagnosis present

## 2017-02-14 DIAGNOSIS — I4892 Unspecified atrial flutter: Secondary | ICD-10-CM | POA: Diagnosis not present

## 2017-02-14 DIAGNOSIS — J449 Chronic obstructive pulmonary disease, unspecified: Secondary | ICD-10-CM | POA: Diagnosis present

## 2017-02-14 DIAGNOSIS — F329 Major depressive disorder, single episode, unspecified: Secondary | ICD-10-CM | POA: Diagnosis present

## 2017-02-14 DIAGNOSIS — Z87891 Personal history of nicotine dependence: Secondary | ICD-10-CM | POA: Diagnosis not present

## 2017-02-14 DIAGNOSIS — G8929 Other chronic pain: Secondary | ICD-10-CM | POA: Diagnosis not present

## 2017-02-14 LAB — TYPE AND SCREEN
ABO/RH(D): O POS
ANTIBODY SCREEN: NEGATIVE

## 2017-02-14 SURGERY — POSTERIOR LUMBAR FUSION 1 WITH HARDWARE REMOVAL
Anesthesia: General | Site: Back

## 2017-02-14 MED ORDER — HYDROMORPHONE HCL 1 MG/ML IJ SOLN
INTRAMUSCULAR | Status: AC
  Filled 2017-02-14: qty 0.5

## 2017-02-14 MED ORDER — CEFAZOLIN SODIUM-DEXTROSE 2-3 GM-% IV SOLR
INTRAVENOUS | Status: DC | PRN
  Administered 2017-02-14 (×2): 2 g via INTRAVENOUS

## 2017-02-14 MED ORDER — SODIUM CHLORIDE 0.9 % IV SOLN: 250.0000 mL | INTRAVENOUS | Status: DC

## 2017-02-14 MED ORDER — PHENYLEPHRINE HCL 10 MG/ML IJ SOLN
INTRAVENOUS | Status: DC | PRN
  Administered 2017-02-14: 25 ug/min via INTRAVENOUS

## 2017-02-14 MED ORDER — THROMBIN 20000 UNITS EX SOLR
CUTANEOUS | Status: DC | PRN
  Administered 2017-02-14: 20 mL via TOPICAL

## 2017-02-14 MED ORDER — ALBUTEROL SULFATE (2.5 MG/3ML) 0.083% IN NEBU: 2.5000 mg | INHALATION_SOLUTION | Freq: Four times a day (QID) | RESPIRATORY_TRACT | Status: DC | PRN

## 2017-02-14 MED ORDER — ROCURONIUM BROMIDE 10 MG/ML (PF) SYRINGE
PREFILLED_SYRINGE | INTRAVENOUS | Status: AC
  Filled 2017-02-14: qty 10

## 2017-02-14 MED ORDER — ONDANSETRON HCL 4 MG PO TABS: 4.0000 mg | ORAL_TABLET | Freq: Four times a day (QID) | ORAL | Status: DC | PRN

## 2017-02-14 MED ORDER — ONDANSETRON HCL 4 MG/2ML IJ SOLN: 4.0000 mg | Freq: Four times a day (QID) | INTRAMUSCULAR | Status: DC | PRN

## 2017-02-14 MED ORDER — ROCURONIUM BROMIDE 100 MG/10ML IV SOLN
INTRAVENOUS | Status: DC | PRN
Start: 2017-02-14 — End: 2017-02-14
  Administered 2017-02-14: 50 mg via INTRAVENOUS
  Administered 2017-02-14: 20 mg via INTRAVENOUS
  Administered 2017-02-14 (×2): 10 mg via INTRAVENOUS

## 2017-02-14 MED ORDER — ONDANSETRON HCL 4 MG/2ML IJ SOLN
INTRAMUSCULAR | Status: AC
  Filled 2017-02-14: qty 2

## 2017-02-14 MED ORDER — DILTIAZEM HCL ER COATED BEADS 240 MG PO CP24
240.0000 mg | ORAL_CAPSULE | Freq: Every day | ORAL | Status: DC
  Administered 2017-02-15: 240 mg via ORAL
  Filled 2017-02-14: qty 1

## 2017-02-14 MED ORDER — DOCUSATE SODIUM 100 MG PO CAPS
100.0000 mg | ORAL_CAPSULE | Freq: Two times a day (BID) | ORAL | Status: DC
  Administered 2017-02-14 – 2017-02-15 (×2): 100 mg via ORAL
  Filled 2017-02-14 (×2): qty 1

## 2017-02-14 MED ORDER — ONDANSETRON HCL 4 MG/2ML IJ SOLN
INTRAMUSCULAR | Status: DC | PRN
  Administered 2017-02-14: 4 mg via INTRAVENOUS

## 2017-02-14 MED ORDER — SODIUM CHLORIDE 0.9% FLUSH: 3.0000 mL | INTRAVENOUS | Status: DC | PRN

## 2017-02-14 MED ORDER — SERTRALINE HCL 100 MG PO TABS
100.0000 mg | ORAL_TABLET | Freq: Every day | ORAL | Status: DC
  Administered 2017-02-14: 100 mg via ORAL
  Filled 2017-02-14: qty 1

## 2017-02-14 MED ORDER — MORPHINE SULFATE (PF) 4 MG/ML IV SOLN: 2.0000 mg | INTRAVENOUS | Status: DC | PRN

## 2017-02-14 MED ORDER — EZETIMIBE 10 MG PO TABS
10.0000 mg | ORAL_TABLET | Freq: Every day | ORAL | Status: DC
  Administered 2017-02-15: 10 mg via ORAL
  Filled 2017-02-14: qty 1

## 2017-02-14 MED ORDER — TIMOLOL HEMIHYDRATE 0.5 % OP SOLN: 1.0000 [drp] | Freq: Two times a day (BID) | OPHTHALMIC | Status: DC

## 2017-02-14 MED ORDER — GELATIN ABSORBABLE MT POWD
OROMUCOSAL | Status: DC | PRN
Start: 2017-02-14 — End: 2017-02-14
  Administered 2017-02-14: 5 mL via TOPICAL

## 2017-02-14 MED ORDER — LIDOCAINE HCL (CARDIAC) 20 MG/ML IV SOLN
INTRAVENOUS | Status: DC | PRN
  Administered 2017-02-14: 30 mg via INTRAVENOUS

## 2017-02-14 MED ORDER — METHOCARBAMOL 500 MG PO TABS
500.0000 mg | ORAL_TABLET | Freq: Four times a day (QID) | ORAL | Status: DC | PRN
  Administered 2017-02-14 – 2017-02-15 (×2): 500 mg via ORAL
  Filled 2017-02-14 (×2): qty 1

## 2017-02-14 MED ORDER — MONTELUKAST SODIUM 10 MG PO TABS
10.0000 mg | ORAL_TABLET | Freq: Every day | ORAL | Status: DC
  Administered 2017-02-14: 10 mg via ORAL
  Filled 2017-02-14: qty 1

## 2017-02-14 MED ORDER — LIDOCAINE 2% (20 MG/ML) 5 ML SYRINGE
INTRAMUSCULAR | Status: AC
  Filled 2017-02-14: qty 5

## 2017-02-14 MED ORDER — ONDANSETRON HCL 4 MG/2ML IJ SOLN: 4.0000 mg | Freq: Once | INTRAMUSCULAR | Status: DC | PRN

## 2017-02-14 MED ORDER — EPHEDRINE 5 MG/ML INJ
INTRAVENOUS | Status: AC
  Filled 2017-02-14: qty 10

## 2017-02-14 MED ORDER — HYDROMORPHONE HCL 1 MG/ML IJ SOLN
0.2500 mg | INTRAMUSCULAR | Status: DC | PRN
  Administered 2017-02-14: 0.5 mg via INTRAVENOUS

## 2017-02-14 MED ORDER — THROMBIN 20000 UNITS EX SOLR
CUTANEOUS | Status: AC
  Filled 2017-02-14: qty 20000

## 2017-02-14 MED ORDER — OXYCODONE HCL 5 MG PO TABS
5.0000 mg | ORAL_TABLET | ORAL | Status: DC | PRN
  Filled 2017-02-14: qty 2

## 2017-02-14 MED ORDER — CEFAZOLIN SODIUM-DEXTROSE 2-4 GM/100ML-% IV SOLN
2.0000 g | Freq: Three times a day (TID) | INTRAVENOUS | Status: DC
  Administered 2017-02-15: 2 g via INTRAVENOUS
  Filled 2017-02-14 (×2): qty 100

## 2017-02-14 MED ORDER — PHENOL 1.4 % MT LIQD: 1.0000 | OROMUCOSAL | Status: DC | PRN

## 2017-02-14 MED ORDER — FENTANYL CITRATE (PF) 250 MCG/5ML IJ SOLN
INTRAMUSCULAR | Status: AC
  Filled 2017-02-14: qty 5

## 2017-02-14 MED ORDER — ADULT MULTIVITAMIN W/MINERALS CH
1.0000 | ORAL_TABLET | Freq: Every day | ORAL | Status: DC
  Administered 2017-02-15: 1 via ORAL
  Filled 2017-02-14: qty 1

## 2017-02-14 MED ORDER — NIACIN ER 500 MG PO CPCR
1000.0000 mg | ORAL_CAPSULE | Freq: Every day | ORAL | Status: DC
  Administered 2017-02-14: 1000 mg via ORAL
  Filled 2017-02-14: qty 2

## 2017-02-14 MED ORDER — FLUTICASONE FUROATE-VILANTEROL 100-25 MCG/INH IN AEPB
1.0000 | INHALATION_SPRAY | Freq: Every day | RESPIRATORY_TRACT | Status: DC
  Filled 2017-02-14: qty 28

## 2017-02-14 MED ORDER — PANTOPRAZOLE SODIUM 40 MG PO TBEC
40.0000 mg | DELAYED_RELEASE_TABLET | Freq: Every day | ORAL | Status: DC
  Administered 2017-02-15: 40 mg via ORAL
  Filled 2017-02-14: qty 1

## 2017-02-14 MED ORDER — LACTATED RINGERS IV SOLN
INTRAVENOUS | Status: DC
  Administered 2017-02-14: 12:00:00 via INTRAVENOUS

## 2017-02-14 MED ORDER — 0.9 % SODIUM CHLORIDE (POUR BTL) OPTIME
TOPICAL | Status: DC | PRN
  Administered 2017-02-14: 1000 mL

## 2017-02-14 MED ORDER — OXYCODONE HCL 5 MG/5ML PO SOLN: 5.0000 mg | Freq: Once | ORAL | Status: DC | PRN

## 2017-02-14 MED ORDER — SUGAMMADEX SODIUM 200 MG/2ML IV SOLN
INTRAVENOUS | Status: DC | PRN
  Administered 2017-02-14: 204.2 mg via INTRAVENOUS

## 2017-02-14 MED ORDER — PROPOFOL 10 MG/ML IV BOLUS
INTRAVENOUS | Status: DC | PRN
  Administered 2017-02-14: 180 mg via INTRAVENOUS

## 2017-02-14 MED ORDER — OXYCODONE HCL 5 MG PO TABS: 5.0000 mg | ORAL_TABLET | Freq: Once | ORAL | Status: DC | PRN

## 2017-02-14 MED ORDER — ALBUTEROL SULFATE HFA 108 (90 BASE) MCG/ACT IN AERS: 2.0000 | INHALATION_SPRAY | Freq: Four times a day (QID) | RESPIRATORY_TRACT | Status: DC | PRN

## 2017-02-14 MED ORDER — LATANOPROST 0.005 % OP SOLN
1.0000 [drp] | Freq: Every day | OPHTHALMIC | Status: DC
  Administered 2017-02-14: 1 [drp] via OPHTHALMIC
  Filled 2017-02-14: qty 2.5

## 2017-02-14 MED ORDER — KETOROLAC TROMETHAMINE 15 MG/ML IJ SOLN
7.5000 mg | Freq: Four times a day (QID) | INTRAMUSCULAR | Status: DC
  Administered 2017-02-14 – 2017-02-15 (×2): 7.5 mg via INTRAVENOUS
  Filled 2017-02-14 (×2): qty 1

## 2017-02-14 MED ORDER — METHOCARBAMOL 1000 MG/10ML IJ SOLN
500.0000 mg | Freq: Four times a day (QID) | INTRAVENOUS | Status: DC | PRN
  Filled 2017-02-14: qty 5

## 2017-02-14 MED ORDER — BISACODYL 10 MG RE SUPP: 10.0000 mg | Freq: Every day | RECTAL | Status: DC | PRN

## 2017-02-14 MED ORDER — ACETAMINOPHEN 500 MG PO TABS
1000.0000 mg | ORAL_TABLET | ORAL | Status: DC | PRN
  Administered 2017-02-15: 1000 mg via ORAL
  Filled 2017-02-14: qty 2

## 2017-02-14 MED ORDER — MULTI-VITAMIN/MINERALS PO TABS: 1.0000 | ORAL_TABLET | Freq: Every day | ORAL | Status: DC

## 2017-02-14 MED ORDER — LIDOCAINE-EPINEPHRINE 1 %-1:100000 IJ SOLN: INTRAMUSCULAR | Status: DC | PRN

## 2017-02-14 MED ORDER — MEMANTINE HCL 5 MG PO TABS
10.0000 mg | ORAL_TABLET | Freq: Every day | ORAL | Status: DC
  Administered 2017-02-15: 10 mg via ORAL
  Filled 2017-02-14: qty 2

## 2017-02-14 MED ORDER — VALSARTAN-HYDROCHLOROTHIAZIDE 160-12.5 MG PO TABS: 1.0000 | ORAL_TABLET | Freq: Every day | ORAL | Status: DC

## 2017-02-14 MED ORDER — IRBESARTAN 300 MG PO TABS
300.0000 mg | ORAL_TABLET | Freq: Every day | ORAL | Status: DC
  Administered 2017-02-15: 300 mg via ORAL
  Filled 2017-02-14: qty 1

## 2017-02-14 MED ORDER — MENTHOL 3 MG MT LOZG: 1.0000 | LOZENGE | OROMUCOSAL | Status: DC | PRN

## 2017-02-14 MED ORDER — HYDROCHLOROTHIAZIDE 12.5 MG PO CAPS
12.5000 mg | ORAL_CAPSULE | Freq: Every day | ORAL | Status: DC
  Administered 2017-02-15: 12.5 mg via ORAL
  Filled 2017-02-14: qty 1

## 2017-02-14 MED ORDER — DONEPEZIL HCL 5 MG PO TABS
5.0000 mg | ORAL_TABLET | Freq: Every day | ORAL | Status: DC
  Administered 2017-02-14 – 2017-02-15 (×2): 5 mg via ORAL
  Filled 2017-02-14 (×2): qty 1

## 2017-02-14 MED ORDER — ROCURONIUM BROMIDE 10 MG/ML (PF) SYRINGE
PREFILLED_SYRINGE | INTRAVENOUS | Status: AC
  Filled 2017-02-14: qty 5

## 2017-02-14 MED ORDER — PROPOFOL 10 MG/ML IV BOLUS
INTRAVENOUS | Status: AC
  Filled 2017-02-14: qty 20

## 2017-02-14 MED ORDER — SODIUM CHLORIDE 0.9 % IR SOLN
Status: DC | PRN
  Administered 2017-02-14: 500 mL

## 2017-02-14 MED ORDER — SODIUM CHLORIDE 0.9% FLUSH
3.0000 mL | Freq: Two times a day (BID) | INTRAVENOUS | Status: DC
  Administered 2017-02-14: 3 mL via INTRAVENOUS

## 2017-02-14 MED ORDER — LIDOCAINE-EPINEPHRINE 1 %-1:100000 IJ SOLN
INTRAMUSCULAR | Status: AC
  Filled 2017-02-14: qty 1

## 2017-02-14 MED ORDER — HYDROMORPHONE HCL 1 MG/ML IJ SOLN: 0.2500 mg | INTRAMUSCULAR | Status: DC | PRN

## 2017-02-14 MED ORDER — FENTANYL CITRATE (PF) 100 MCG/2ML IJ SOLN
INTRAMUSCULAR | Status: DC | PRN
  Administered 2017-02-14: 100 ug via INTRAVENOUS
  Administered 2017-02-14: 50 ug via INTRAVENOUS
  Administered 2017-02-14: 150 ug via INTRAVENOUS

## 2017-02-14 MED ORDER — LORATADINE 10 MG PO TABS
10.0000 mg | ORAL_TABLET | Freq: Every day | ORAL | Status: DC
  Administered 2017-02-15: 10 mg via ORAL
  Filled 2017-02-14: qty 1

## 2017-02-14 MED ORDER — TIMOLOL MALEATE 0.5 % OP SOLN
1.0000 [drp] | Freq: Two times a day (BID) | OPHTHALMIC | Status: DC
  Administered 2017-02-14 – 2017-02-15 (×2): 1 [drp] via OPHTHALMIC

## 2017-02-14 MED ORDER — GUAIFENESIN ER 600 MG PO TB12
600.0000 mg | ORAL_TABLET | Freq: Two times a day (BID) | ORAL | Status: DC | PRN
  Administered 2017-02-14: 600 mg via ORAL
  Filled 2017-02-14: qty 1

## 2017-02-14 MED ORDER — ALBUTEROL SULFATE HFA 108 (90 BASE) MCG/ACT IN AERS
2.0000 | INHALATION_SPRAY | Freq: Four times a day (QID) | RESPIRATORY_TRACT | Status: DC | PRN
  Filled 2017-02-14: qty 6.7

## 2017-02-14 MED ORDER — DORZOLAMIDE HCL-TIMOLOL MAL 2-0.5 % OP SOLN
1.0000 [drp] | Freq: Two times a day (BID) | OPHTHALMIC | Status: DC
  Administered 2017-02-14 – 2017-02-15 (×2): 1 [drp] via OPHTHALMIC
  Filled 2017-02-14 (×2): qty 10

## 2017-02-14 MED ORDER — LACTATED RINGERS IV SOLN
INTRAVENOUS | Status: DC | PRN
  Administered 2017-02-14 (×3): via INTRAVENOUS

## 2017-02-14 MED ORDER — SENNA 8.6 MG PO TABS
1.0000 | ORAL_TABLET | Freq: Two times a day (BID) | ORAL | Status: DC
  Administered 2017-02-14 – 2017-02-15 (×2): 8.6 mg via ORAL
  Filled 2017-02-14 (×2): qty 1

## 2017-02-14 MED ORDER — SODIUM CHLORIDE 0.9 % IV SOLN: INTRAVENOUS | Status: DC

## 2017-02-14 MED ORDER — TIMOLOL MALEATE 0.5 % OP SOLN
1.0000 [drp] | Freq: Two times a day (BID) | OPHTHALMIC | Status: DC
  Filled 2017-02-14: qty 5

## 2017-02-14 MED ORDER — SUGAMMADEX SODIUM 500 MG/5ML IV SOLN
INTRAVENOUS | Status: AC
  Filled 2017-02-14: qty 5

## 2017-02-14 MED ORDER — EPHEDRINE SULFATE 50 MG/ML IJ SOLN
INTRAMUSCULAR | Status: DC | PRN
  Administered 2017-02-14: 10 mg via INTRAVENOUS

## 2017-02-14 MED ORDER — NIACIN ER (ANTIHYPERLIPIDEMIC) 1000 MG PO TBCR: 1000.0000 mg | EXTENDED_RELEASE_TABLET | Freq: Every day | ORAL | Status: DC

## 2017-02-14 MED ORDER — METOPROLOL SUCCINATE ER 25 MG PO TB24
25.0000 mg | ORAL_TABLET | Freq: Every day | ORAL | Status: DC
  Administered 2017-02-15: 25 mg via ORAL
  Filled 2017-02-14: qty 1

## 2017-02-14 MED ORDER — THROMBIN 5000 UNITS EX SOLR
CUTANEOUS | Status: AC
  Filled 2017-02-14: qty 5000

## 2017-02-14 MED ORDER — KETOROLAC TROMETHAMINE 0.5 % OP SOLN
1.0000 [drp] | Freq: Four times a day (QID) | OPHTHALMIC | Status: DC
  Administered 2017-02-14 – 2017-02-15 (×2): 1 [drp] via OPHTHALMIC
  Filled 2017-02-14: qty 3

## 2017-02-14 SURGICAL SUPPLY — 80 items
ADH SKN CLS APL DERMABOND .7 (GAUZE/BANDAGES/DRESSINGS) ×2
APL SKNCLS STERI-STRIP NONHPOA (GAUZE/BANDAGES/DRESSINGS)
BAG DECANTER FOR FLEXI CONT (MISCELLANEOUS) ×3 IMPLANT
BENZOIN TINCTURE PRP APPL 2/3 (GAUZE/BANDAGES/DRESSINGS) IMPLANT
BLADE CLIPPER SURG (BLADE) IMPLANT
BLADE SURG 11 STRL SS (BLADE) ×3 IMPLANT
BUR MATCHSTICK NEURO 3.0 LAGG (BURR) ×5 IMPLANT
BUR PRECISION FLUTE 5.0 (BURR) ×5 IMPLANT
CAGE RISE 11-17-15 10X26 (Cage) ×4 IMPLANT
CANISTER SUCT 3000ML PPV (MISCELLANEOUS) ×3 IMPLANT
CAP LOCKING THREADED (Cap) ×8 IMPLANT
CARTRIDGE OIL MAESTRO DRILL (MISCELLANEOUS) ×1 IMPLANT
CLOSURE WOUND 1/2 X4 (GAUZE/BANDAGES/DRESSINGS)
CONT SPEC 4OZ CLIKSEAL STRL BL (MISCELLANEOUS) ×3 IMPLANT
COVER BACK TABLE 60X90IN (DRAPES) ×3 IMPLANT
DECANTER SPIKE VIAL GLASS SM (MISCELLANEOUS) ×3 IMPLANT
DERMABOND ADVANCED (GAUZE/BANDAGES/DRESSINGS) ×4
DERMABOND ADVANCED .7 DNX12 (GAUZE/BANDAGES/DRESSINGS) ×1 IMPLANT
DIFFUSER DRILL AIR PNEUMATIC (MISCELLANEOUS) ×3 IMPLANT
DRAPE C-ARM 42X72 X-RAY (DRAPES) ×3 IMPLANT
DRAPE C-ARMOR (DRAPES) ×3 IMPLANT
DRAPE LAPAROTOMY 100X72X124 (DRAPES) ×3 IMPLANT
DRAPE POUCH INSTRU U-SHP 10X18 (DRAPES) ×3 IMPLANT
DRAPE SURG 17X23 STRL (DRAPES) ×3 IMPLANT
DRSG OPSITE POSTOP 4X10 (GAUZE/BANDAGES/DRESSINGS) ×2 IMPLANT
DURAPREP 26ML APPLICATOR (WOUND CARE) ×3 IMPLANT
ELECT REM PT RETURN 9FT ADLT (ELECTROSURGICAL) ×3
ELECTRODE REM PT RTRN 9FT ADLT (ELECTROSURGICAL) ×1 IMPLANT
GAUZE SPONGE 4X4 12PLY STRL (GAUZE/BANDAGES/DRESSINGS) IMPLANT
GAUZE SPONGE 4X4 16PLY XRAY LF (GAUZE/BANDAGES/DRESSINGS) IMPLANT
GLOVE BIO SURGEON STRL SZ7 (GLOVE) IMPLANT
GLOVE BIO SURGEON STRL SZ8 (GLOVE) ×2 IMPLANT
GLOVE BIO SURGEON STRL SZ8.5 (GLOVE) ×2 IMPLANT
GLOVE BIOGEL PI IND STRL 7.0 (GLOVE) IMPLANT
GLOVE BIOGEL PI IND STRL 7.5 (GLOVE) ×1 IMPLANT
GLOVE BIOGEL PI INDICATOR 7.0 (GLOVE)
GLOVE BIOGEL PI INDICATOR 7.5 (GLOVE) ×4
GLOVE ECLIPSE 7.0 STRL STRAW (GLOVE) ×5 IMPLANT
GLOVE EXAM NITRILE LRG STRL (GLOVE) IMPLANT
GLOVE EXAM NITRILE XL STR (GLOVE) IMPLANT
GLOVE EXAM NITRILE XS STR PU (GLOVE) IMPLANT
GLOVE SURG SS PI 7.5 STRL IVOR (GLOVE) ×8 IMPLANT
GOWN STRL REUS W/ TWL LRG LVL3 (GOWN DISPOSABLE) ×2 IMPLANT
GOWN STRL REUS W/ TWL XL LVL3 (GOWN DISPOSABLE) IMPLANT
GOWN STRL REUS W/TWL 2XL LVL3 (GOWN DISPOSABLE) IMPLANT
GOWN STRL REUS W/TWL LRG LVL3 (GOWN DISPOSABLE) ×12
GOWN STRL REUS W/TWL XL LVL3 (GOWN DISPOSABLE) ×3
HEMOSTAT POWDER KIT SURGIFOAM (HEMOSTASIS) ×3 IMPLANT
KIT BASIN OR (CUSTOM PROCEDURE TRAY) ×3 IMPLANT
KIT INFUSE X SMALL 1.4CC (Orthopedic Implant) ×2 IMPLANT
KIT POSITION SURG JACKSON T1 (MISCELLANEOUS) ×3 IMPLANT
KIT ROOM TURNOVER OR (KITS) ×3 IMPLANT
MILL MEDIUM DISP (BLADE) ×2 IMPLANT
NDL HYPO 18GX1.5 BLUNT FILL (NEEDLE) IMPLANT
NDL HYPO 25X1 1.5 SAFETY (NEEDLE) ×1 IMPLANT
NDL SPNL 18GX3.5 QUINCKE PK (NEEDLE) IMPLANT
NEEDLE HYPO 18GX1.5 BLUNT FILL (NEEDLE) IMPLANT
NEEDLE HYPO 25X1 1.5 SAFETY (NEEDLE) ×3 IMPLANT
NEEDLE SPNL 18GX3.5 QUINCKE PK (NEEDLE) IMPLANT
NS IRRIG 1000ML POUR BTL (IV SOLUTION) ×3 IMPLANT
OIL CARTRIDGE MAESTRO DRILL (MISCELLANEOUS) ×3
PACK LAMINECTOMY NEURO (CUSTOM PROCEDURE TRAY) ×3 IMPLANT
PAD ARMBOARD 7.5X6 YLW CONV (MISCELLANEOUS) ×9 IMPLANT
RASP 3.0MM (RASP) ×2 IMPLANT
ROD SPINAL 35MM (Rod) ×4 IMPLANT
SCREW 6.5X45 (Screw) ×4 IMPLANT
SCREW CREO 7.5X45MM (Screw) ×4 IMPLANT
SPONGE LAP 4X18 X RAY DECT (DISPOSABLE) IMPLANT
SPONGE SURGIFOAM ABS GEL 100 (HEMOSTASIS) ×3 IMPLANT
STRIP CLOSURE SKIN 1/2X4 (GAUZE/BANDAGES/DRESSINGS) IMPLANT
SUT VIC AB 0 CT1 18XCR BRD8 (SUTURE) ×1 IMPLANT
SUT VIC AB 0 CT1 8-18 (SUTURE) ×9
SUT VIC AB 3-0 SH 8-18 (SUTURE) ×2 IMPLANT
SUT VICRYL 3-0 RB1 18 ABS (SUTURE) ×7 IMPLANT
SYR 3ML LL SCALE MARK (SYRINGE) ×4 IMPLANT
TOWEL GREEN STERILE (TOWEL DISPOSABLE) ×2 IMPLANT
TOWEL GREEN STERILE FF (TOWEL DISPOSABLE) ×3 IMPLANT
TRAP SPECIMEN MUCOUS 40CC (MISCELLANEOUS) ×1 IMPLANT
TRAY FOLEY W/METER SILVER 16FR (SET/KITS/TRAYS/PACK) ×3 IMPLANT
WATER STERILE IRR 1000ML POUR (IV SOLUTION) ×3 IMPLANT

## 2017-02-14 NOTE — Anesthesia Preprocedure Evaluation (Addendum)
Anesthesia Evaluation  Patient identified by MRN, date of birth, ID band Patient awake    Reviewed: Allergy & Precautions, NPO status , Patient's Chart, lab work & pertinent test results  Airway Mallampati: II  TM Distance: >3 FB Neck ROM: Full    Dental  (+) Teeth Intact, Dental Advisory Given, Caps   Pulmonary former smoker,    breath sounds clear to auscultation       Cardiovascular  Rhythm:Regular Rate:Normal     Neuro/Psych    GI/Hepatic   Endo/Other    Renal/GU      Musculoskeletal   Abdominal   Peds  Hematology   Anesthesia Other Findings   Reproductive/Obstetrics                             Anesthesia Physical Anesthesia Plan  ASA: III  Anesthesia Plan: General   Post-op Pain Management:    Induction: Intravenous  Airway Management Planned: Oral ETT  Additional Equipment:   Intra-op Plan:   Post-operative Plan: Extubation in OR  Informed Consent: I have reviewed the patients History and Physical, chart, labs and discussed the procedure including the risks, benefits and alternatives for the proposed anesthesia with the patient or authorized representative who has indicated his/her understanding and acceptance.   Dental advisory given  Plan Discussed with: CRNA and Anesthesiologist  Anesthesia Plan Comments:         Anesthesia Quick Evaluation

## 2017-02-14 NOTE — Anesthesia Postprocedure Evaluation (Addendum)
Anesthesia Post Note  Patient: Juan Johnson  Procedure(s) Performed: Procedure(s) (LRB): EXPLORATION OF FUSION LUMBAR THREE- LUMBAR FIVE, REPLACEMENT OF LUMBAR FIVE SCREWS,POSTERIOR LUMBAR INTERBODY FUSION LUMBAR FIVE- SACRAL ONE  (N/A)  Patient location during evaluation: PACU Anesthesia Type: General Level of consciousness: awake and alert Pain management: pain level controlled Vital Signs Assessment: post-procedure vital signs reviewed and stable Respiratory status: spontaneous breathing, nonlabored ventilation, respiratory function stable and patient connected to nasal cannula oxygen Cardiovascular status: blood pressure returned to baseline and stable Postop Assessment: no signs of nausea or vomiting Anesthetic complications: no       Last Vitals:  Vitals:   02/14/17 2000 02/14/17 2006  BP: (!) 118/58 117/60  Pulse: (!) 48 (!) 52  Resp: 11 11  Temp:  36.5 C    Last Pain:  Vitals:   02/14/17 2006  TempSrc:   PainSc: Asleep    LLE Motor Response: Purposeful movement;Responds to commands (02/14/17 2006) LLE Sensation: Full sensation (02/14/17 2006) RLE Motor Response: Purposeful movement;Responds to commands (02/14/17 2006) RLE Sensation: Full sensation (02/14/17 2006)      Carisma Troupe S

## 2017-02-14 NOTE — Transfer of Care (Signed)
Immediate Anesthesia Transfer of Care Note  Patient: Juan Johnson  Procedure(s) Performed: Procedure(s): EXPLORATION OF FUSION LUMBAR THREE- LUMBAR FIVE, REPLACEMENT OF LUMBAR FIVE SCREWS,POSTERIOR LUMBAR INTERBODY FUSION LUMBAR FIVE- SACRAL ONE  (N/A)  Patient Location: PACU  Anesthesia Type:General  Level of Consciousness: awake and alert   Airway & Oxygen Therapy: Patient Spontanous Breathing and Patient connected to nasal cannula oxygen  Post-op Assessment: Report given to RN and Post -op Vital signs reviewed and stable  Post vital signs: Reviewed and stable  Last Vitals:  Vitals:   02/14/17 1148 02/14/17 1928  BP: 137/65 (P) 129/65  Pulse: (!) 48   Resp: 20 (P) 11  Temp: 36.3 C (P) 36.3 C    Last Pain:  Vitals:   02/14/17 1148  TempSrc: Oral      Patients Stated Pain Goal: 4 (62/95/28 4132)  Complications: No apparent anesthesia complications

## 2017-02-14 NOTE — H&P (Signed)
CC:  Back and leg pain  HPI: Mr. Juan Johnson is a 66 year old man I am seeing for back and leg pain. He was seen having undergone previous L3-4 L4-5 fusion back in December of 2016. He has had continued low back and left greater than right leg pain. At our last visit I ordered CT myelogram which has been completed. To review, he continues to have fairly severe low back pain with severe left-sided greater than right leg pain. Although he can walk reasonably well, he has had any kind of activity significantly exacerbates his pain. He has previously been through physical therapy, is unable to take nonsteroidal medications due to his cardiac disease and anticoagulation with Eliquis, and has previously undergone epidural steroid injections which ceased working.   PMH: Past Medical History:  Diagnosis Date  . Anemia   . Anxiety    claustrophobia  . Arthritis   . Complication of anesthesia    difficult to wake up after neck surgery  . COPD (chronic obstructive pulmonary disease) ([place])   . Dementia   . Depression   . Enlarged liver    20-25 years ago  . GERD (gastroesophageal reflux disease)   . Glaucoma   . Hemoptysis    a. 03/2016 in setting of PNA-->resolved.  . Hypertensive heart disease   . Mitral regurgitation    a. 09/2015 Echo: EF 55-60%, no rwma, mild to mod MR, PASP 5mmHg.  . Paroxysmal atrial flutter ([place])    a. Noted 09/2015 following back surgery-->Eliquis (CHA2DS2VASc = 4);  b. 03/2016 Recurrent PAFlutter in setting of PNA-->converted spont.  . 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 Pneumonia 04/2015   "bronchial" pneumonia   . Retinal tear of left eye   . Sleep apnea    does use cpap  . Stroke [facility]) ? 05/2014   peripheral vision loss    PSH: Past Surgical History:  Procedure Laterality Date  . BACK SURGERY  09/27/2015   [place]  . CARDIAC CATHETERIZATION  2003  . COLONOSCOPY    . EYE SURGERY  10/2007  . EYE SURGERY Bilateral 2018   Eyelid  . LEG  SURGERY     AT AGE 29  . LUMBAR FUSION  09/27/2015  . NECK SURGERY  06/29/2010  . SHOULDER SURGERY  09/24/2011  . TONSILLECTOMY     AT AGE 57  . VASECTOMY      SH: Social History  Substance Use Topics  . Smoking status: Former Smoker    Packs/day: 3.00    Years: 23.00    Types: Cigarettes    Quit date: 10/12/1997  . Smokeless tobacco: Never Used  . Alcohol use No     Comment: OCC    MEDS: Prior to Admission medications   Medication Sig Start Date End Date Taking? Authorizing Provider  acetaminophen (TYLENOL) 500 MG tablet Take 1,000 mg by mouth every 4 (four) hours as needed for mild pain or moderate pain (Takes 2 every 4- 6 hours).    Yes Historical Provider, MD  albuterol (PROVENTIL HFA;VENTOLIN HFA) 108 (90 Base) MCG/ACT inhaler Inhale 2 puffs into the lungs every 6 (six) hours as needed for wheezing or shortness of breath.    Yes Historical Provider, MD  apixaban (ELIQUIS) 5 MG TABS tablet Take 1 tablet (5 mg total) by mouth 2 (two) times daily. 01/01/17  Yes Peter M Martinique, MD  BREO ELLIPTA 100-25 MCG/INH AEPB Take 1 puff by mouth daily. 02/13/16  Yes  Historical Provider, MD  cetirizine (ZYRTEC) 10 MG tablet Take 10 mg by mouth daily.   Yes Historical Provider, MD  diltiazem (CARDIZEM CD) 240 MG 24 hr capsule Take 1 capsule (240 mg total) by mouth daily. 01/01/17  Yes Peter M Martinique, MD  donepezil (ARICEPT) 5 MG tablet Take 1 tablet (5 mg total) by mouth daily. 08/12/16  Yes Melvenia Beam, MD  ezetimibe (ZETIA) 10 MG tablet Take 10 mg by mouth daily.   Yes Historical Provider, MD  guaiFENesin (MUCINEX) 600 MG 12 hr tablet Take 600 mg by mouth 2 (two) times daily as needed for cough.   Yes Historical Provider, MD  ketorolac (ACULAR) 0.5 % ophthalmic solution Place 1 drop into the left eye 4 (four) times daily.  08/22/14  Yes Historical Provider, MD  latanoprost (XALATAN) 0.005 % ophthalmic solution Place 1 drop into both eyes at bedtime.    Yes Historical Provider, MD   metoprolol succinate (TOPROL XL) 25 MG 24 hr tablet Take 1 tablet (25 mg total) by mouth daily. 01/01/17  Yes Peter M Martinique, MD  montelukast (SINGULAIR) 10 MG tablet Take 10 mg by mouth at bedtime.  03/09/15  Yes Historical Provider, MD  Multiple Vitamins-Minerals (MULTIVITAMIN WITH MINERALS) tablet Take 1 tablet by mouth daily.   Yes Historical Provider, MD  niacin (NIASPAN) 1000 MG CR tablet Take 1,000 mg by mouth at bedtime.  03/09/15  Yes Historical Provider, MD  omeprazole (PRILOSEC) 20 MG capsule Take 20 mg by mouth 2 (two) times daily before a meal.  03/09/15  Yes Historical Provider, MD  sertraline (ZOLOFT) 100 MG tablet Take 1 tablet (100 mg total) by mouth daily. Patient taking differently: Take 100 mg by mouth at bedtime.  08/12/16  Yes Melvenia Beam, MD  timolol (BETIMOL) 0.5 % ophthalmic solution Place 1 drop into the left eye 2 (two) times daily.   Yes Historical Provider, MD  valsartan-hydrochlorothiazide (DIOVAN-HCT) 160-12.5 MG tablet Take 1 tablet by mouth daily. 01/01/17  Yes Peter M Martinique, MD  ALPRAZolam Duanne Moron) 0.5 MG tablet Take 1-2 30 minutes before MRi do not drive. May repeat if needed. 02/15/16   Melvenia Beam, MD  dorzolamide-timolol (COSOPT) 22.3-6.8 MG/ML ophthalmic solution Place 1 drop into the left eye 2 (two) times daily.    Historical Provider, MD  memantine (NAMENDA) 10 MG tablet Start with one tab daily for 2 weeks and then increase to 1 tablet (10 mg total) by mouth 2 (two) times daily. 02/10/17   Melvenia Beam, MD    ALLERGY: Allergies  Allergen Reactions  . Ace Inhibitors Cough  . Crestor [Rosuvastatin] Other (See Comments)    MYALGIAS CRAMPS MUSCLE WEAKNESS  . Simvastatin Other (See Comments)    MYALGIAS CRAMPS MUSCLE WEAKNESS    ROS: ROS  NEUROLOGIC EXAM: Awake, alert, oriented Memory and concentration grossly intact Speech fluent, appropriate CN grossly intact Motor exam: Upper Extremities Deltoid Bicep Tricep Grip  Right 5/5 5/5 5/5  5/5  Left 5/5 5/5 5/5 5/5   Lower Extremity IP Quad PF DF EHL  Right 5/5 5/5 5/5 5/5 5/5  Left 5/5 5/5 5/5 5/5 5/5   Sensation grossly intact to LT  [facility]: CT myelogram dated 01/09/2017 was reviewed. This demonstrated good position of the interbody devices at L3-4 and L4-5. There does appear to be some evidence of interbody fusion at these levels, as well as some evidence of posterior fusion also. There is good position of the pedicle screw construct. At L5-S1  there is severe left-sided facet arthropathy with grade 1 anterolisthesis, and ligamentous hypertrophy. There is resultant severe left greater than right lateral recess stenosis.   IMPRESSION: 66 year old man with continued back and left greater than right leg pain despite reasonable conservative treatments over the last year. CT myelogram demonstrates adjacent segment disease with severe stenosis, spondylolisthesis, and facet arthropathy at L5-S1. Although somewhat difficult to tell there appears to be fusion at L3-4 and L4-5.  PLAN: We will plan on proceeding with surgical decompression and fusion at L5-S1. We will explore the L3-4 and L4-5 fusion. If this is solid, we will likely remove the L3-4 pedicle screws, and connect new S1 screws to the previously placed L5.   I have reviewed the myelogram findings and treatment options at this point with the patient and his wife in the office. We discussed continued conservative treatments including possible epidural steroid injections, versus surgical decompression and extension of fusion. The patient and his wife are aware of the risks of the procedure, but they were reiterated. These risks include but are not limited to nerve root injury leading to leg or foot weakness/numbness and/or bowel and bladder dysfunction, CSF leak, bleeding, and infection. Possible outcomes of surgery were also discussed including the possibility of persistence or worsening of pain symptoms and the possiblity of  accelerated adjacent level degeneration. The general risks of anesthesia were also reviewed including heart attack, stroke, and DVT/PE.   The patient understood our discussion and is willing to proceed with surgical decompression and fusion. All questions were answered.

## 2017-02-14 NOTE — Anesthesia Procedure Notes (Signed)
Procedure Name: Intubation Date/Time: 02/14/2017 3:16 PM Performed by: Eligha Bridegroom Pre-anesthesia Checklist: Patient identified, Emergency Drugs available, Suction available, Patient being monitored and Timeout performed Patient Re-evaluated:Patient Re-evaluated prior to inductionOxygen Delivery Method: Circle system utilized Preoxygenation: Pre-oxygenation with 100% oxygen Intubation Type: IV induction Ventilation: Mask ventilation without difficulty Laryngoscope Size: Mac and 4 Grade View: Grade I Tube type: Oral Tube size: 7.5 mm Airway Equipment and Method: Stylet Placement Confirmation: ETT inserted through vocal cords under direct vision,  positive ETCO2 and breath sounds checked- equal and bilateral Secured at: 22 cm Tube secured with: Tape Dental Injury: Teeth and Oropharynx as per pre-operative assessment

## 2017-02-14 NOTE — Op Note (Signed)
PREOP DIAGNOSIS:  1. Lumbago with radiculopathy, L5-S1 2. Spondylolisthesis, L5-S1  POSTOP DIAGNOSIS: Same  PROCEDURE: 1. Exploration of fusion, L3-L5 2. L5-S1 laminectomy with facetectomy for decompression of exiting nerve roots, more than would be required for placement of interbody graft 3. Placement of anterior interbody device - Globus Rise 24mm x2 4. Removal of L5 screws and L4-5 rod 5. Posterior non-segmental instrumentation Globus Creo pedicle screws at L5 - S1 6. Interbody arthrodesis, L5-S1 7. Posterolateral arthrodesis, L5-S1 8. Use of locally harvested bone autograft 9. Use of non-structural bone allograft - BMP  SURGEON: Dr. Consuella Lose, MD  ASSISTANT: Dr. Newman Pies, MD  ANESTHESIA: General Endotracheal  EBL: 230cc  SPECIMENS: None  DRAINS: None  COMPLICATIONS: None immediate  CONDITION: Hemodynamically stable to PACU  HISTORY: Juan Johnson is a 66 y.o. male who has been followed in the outpatient clinic with back and leg pain related to development of stenosis with spondylolisthesis at L5-S1. He previously underwent fusion from L3-L5 about a year and a half ago. He attempted multiple conservative treatments and ultimately elected to proceed with surgical decompression and fusion. Risks and benefits were reviewed and consent was obtained.  PROCEDURE IN DETAIL: After informed consent was obtained and witnessed, the patient was brought to the operating room. After induction of general anesthesia, the patient was positioned on the operative table in the prone position. All pressure points were meticulously padded. Previous incision was then marked out and prepped and draped in the usual sterile fashion.  After timeout was conducted, incision was then made sharply and Bovie electrocautery was used to dissect the subcutaneous tissue until the lumbodorsal fascia was identified and incised. The muscle was then elevated in the subperiosteal plane at L5. The  spinous process at L2 was also identified, and dissection laterally allowed identification of the previous pedicle screw construct. Self-retaining retractors were then placed.  There appeared to be a significant amount of bone overgrowing the screws at L3, L3-4, and L5. Further dissection did appear to reveal solid posterolateral fusion mass at L3-4 and L4-5. The decision was therefore made to leave the L3 and L4 screws in place, and cut the rod between L4 and L5 in order to preserve the posterior lateral fusion mass.  The setscrews were removed at L5, after bony overgrowth was removed. The rod between L4 and L5 was dissected out, and bony overgrowth was removed. Metal cutting bur on the high-speed drill was then used to cut the rod between L4 and L5 on both sides. The distal portion of the rod was then removed.  At this point, we began decompression. Complete laminectomy with facetectomy was completed at L5-S1 with combination of high-speed drill and Kerrison rongeurs. Removed bone was collected for later use in interbody and posterolateral arthrodesis. There did appear to be a significant amount of compression especially on the left side from ligamentous hypertrophy/calcification, as well as significant facet hypertrophy. The exiting and traversing nerve roots at L5 and S1 were identified and completely decompressed.  The disc space was then identified. Disc space was then incised bilaterally, and using a combination of curettes, rongeurs, and shavers, complete discectomy was completed. The endplates were prepared with curettes.  The interspace was then packed with the previously collected morcellized bone autograft, mixed with BMP. 10 mm globus expandable cages were then tapped into place bilaterally, and expanded under fluoroscopy. Good position was confirmed with fluoroscopy.  At this point, pedicle screws were placed at S1 with lateral fluoroscopic guidance, and tapped  to 5 5 x 40 mm. 6.5 x 45 mm  screws were then placed. In attempting to move the polyaxial head at L5, the screw began to back out indicating likely poor purchase. The screws were therefore removed, and replaced with 7.5 x 45 screws. A 65mm pre-bent lordotic rod was then placed, set screws were placed and final tightened. Final AP and lateral fluoroscopy demonstrated good position of the hardware.  At this point the high-speed drill was used to decorticate the lateral facet complex and transverse process I laterally in preparation for posterolateral fusion. More of the previously collected morcellized bone autograft was then placed in the lateral gutters with more BMP.  The wound was then irrigated with copious amounts of antibiotic saline, then closed in standard fashion using a combination of interrupted 0 and 3-0 Vicryl stitches in the muscular, fascial, and subcutaneous layers. Skin was then closed using standard Dermabond. Sterile dressing was then applied. The patient was then transferred to the stretcher, extubated, and taken to the postanesthesia care unit in stable hemodynamic condition.  At the end of the case all sponge, needle, cottonoid, and instrument counts were correct.

## 2017-02-15 NOTE — Discharge Summary (Signed)
Physician Discharge Summary  Patient ID: Juan Johnson MRN: [id number] DOB/AGE: August 01, 1951 66 y.o.  Admit date: 02/14/2017 Discharge date: 02/15/2017  Admission Diagnoses:Lumbar spondylosis and stenosis  Discharge Diagnoses: Same Active Problems:   Spondylolisthesis at L5-S1 level   Discharged Condition: good  Hospital Course: Patient is [name] underwent decompressive laminectomy and fusion postoperatively patient did very well was angling and voiding spontaneously tolerating a regular diet stable on oral medication. Patient will be discharged postop day 1  Consults: Significant Diagnostic Studies: Treatments: Decompression fusion lumbar Discharge Exam: Blood pressure (!) 144/63, pulse 66, temperature 99.1 F (37.3 C), temperature source Oral, resp. rate 20, height 5\' 7"  (1.702 m), weight 104.9 kg (231 lb 4.2 oz), SpO2 96 %. Strength out of 5 wound clean dry and intact  Disposition: Home   Allergies as of 02/15/2017      Reactions   Ace Inhibitors Cough   Crestor [rosuvastatin] Other (See Comments)   MYALGIAS CRAMPS MUSCLE WEAKNESS   Simvastatin Other (See Comments)   MYALGIAS CRAMPS MUSCLE WEAKNESS      Medication List    TAKE these medications   acetaminophen 500 MG tablet Commonly known as:  TYLENOL Take 1,000 mg by mouth every 4 (four) hours as needed for mild pain or moderate pain (Takes 2 every 4- 6 hours).   albuterol 108 (90 Base) MCG/ACT inhaler Commonly known as:  PROVENTIL HFA;VENTOLIN HFA Inhale 2 puffs into the lungs every 6 (six) hours as needed for wheezing or shortness of breath.   ALPRAZolam 0.5 MG tablet Commonly known as:  XANAX Take 1-2 30 minutes before MRi do not drive. May repeat if needed.   apixaban 5 MG Tabs tablet Commonly known as:  ELIQUIS Take 1 tablet (5 mg total) by mouth 2 (two) times daily.   BREO ELLIPTA 100-25 MCG/INH Aepb Generic drug:  fluticasone furoate-vilanterol Take 1 puff by mouth daily.   cetirizine  10 MG tablet Commonly known as:  ZYRTEC Take 10 mg by mouth daily.   diltiazem 240 MG 24 hr capsule Commonly known as:  CARDIZEM CD Take 1 capsule (240 mg total) by mouth daily.   donepezil 5 MG tablet Commonly known as:  ARICEPT Take 1 tablet (5 mg total) by mouth daily.   dorzolamide-timolol 22.3-6.8 MG/ML ophthalmic solution Commonly known as:  COSOPT Place 1 drop into the left eye 2 (two) times daily.   ezetimibe 10 MG tablet Commonly known as:  ZETIA Take 10 mg by mouth daily.   guaiFENesin 600 MG 12 hr tablet Commonly known as:  MUCINEX Take 600 mg by mouth 2 (two) times daily as needed for cough.   ketorolac 0.5 % ophthalmic solution Commonly known as:  ACULAR Place 1 drop into the left eye 4 (four) times daily.   memantine 10 MG tablet Commonly known as:  NAMENDA Start with one tab daily for 2 weeks and then increase to 1 tablet (10 mg total) by mouth 2 (two) times daily.   metoprolol succinate 25 MG 24 hr tablet Commonly known as:  TOPROL XL Take 1 tablet (25 mg total) by mouth daily.   montelukast 10 MG tablet Commonly known as:  SINGULAIR Take 10 mg by mouth at bedtime.   multivitamin with minerals tablet Take 1 tablet by mouth daily.   niacin 1000 MG CR tablet Commonly known as:  NIASPAN Take 1,000 mg by mouth at bedtime.   omeprazole 20 MG capsule Commonly known as:  PRILOSEC Take 20 mg by mouth 2 (two)  times daily before a meal.   sertraline 100 MG tablet Commonly known as:  ZOLOFT Take 1 tablet (100 mg total) by mouth daily. What changed:  when to take this   timolol 0.5 % ophthalmic solution Commonly known as:  BETIMOL Place 1 drop into the left eye 2 (two) times daily.   valsartan-hydrochlorothiazide 160-12.5 MG tablet Commonly known as:  DIOVAN-HCT Take 1 tablet by mouth daily.   XALATAN 0.005 % ophthalmic solution Generic drug:  latanoprost Place 1 drop into both eyes at bedtime.      Follow-up Information    Consuella Lose, MD Follow up.   Specialty:  Neurosurgery Contact information: [address] [place] [address] [place] [address] [phone]           Signed: Elaina Hoops 02/15/2017, 8:41 AM

## 2017-02-15 NOTE — Progress Notes (Addendum)
Patient ambulated in hallway with standby assist and RW approximately 50 yards.  Minimal complaints of pain.  Continue to monitor.   Foley d/c'd per order, no complaints. Patient ambulated again in hall with standby assist and RW. Continue to monitor.

## 2017-02-15 NOTE — Progress Notes (Signed)
OT Cancellation Note  Patient Details Name: Juan Johnson MRN: [id number] DOB: [birth year]   Cancelled Treatment:    Reason Eval/Treat Not Completed: OT screened, no needs identified, will sign off. Pt able to verbalize all back precautions and has Juan good understanding of compensatory strategies to adhere to these during ADL. He will have 24 hour assistance at home. No further acute OT needs identified and will sign off.  Norman Herrlich, MS OTR/L  Pager: [place] Juan Johnson 02/15/2017, 10:55 AM

## 2017-02-15 NOTE — Evaluation (Signed)
Physical Therapy Evaluation Patient Details Name: Juan Johnson MRN: [id number] DOB: [birth year] Today's Date: 02/15/2017   History of Present Illness  Patient is a 66 y/o male s/p spinal surgery. PMH includes HTN, arthritis, CVA, causing peripheral vision loss, anxiety, depression, glaucoma and COPD.  Clinical Impression  Patient seen for mobility assessment s/p spinal surgery. Mobilize well. Educated patient on precautions, mobility expectations, safety and car transfers. No further acute PT needs. Will sign off.    Follow Up Recommendations No PT follow up    Equipment Recommendations  None recommended by PT    Recommendations for Other Services       Precautions / Restrictions Precautions Precautions: Back Precaution Booklet Issued: Yes (comment) Precaution Comments: verbally reviewed with patient Required Braces or Orthoses: Spinal Brace Spinal Brace: Lumbar corset      Mobility  Bed Mobility               General bed mobility comments: received in chair  Transfers Overall transfer level: Modified independent   Transfers: Sit to/from Stand Sit to Stand: Modified independent (Device/Increase time)         General transfer comment: increased time and effort to perform. performed x3 during session  Ambulation/Gait Ambulation/Gait assistance: Supervision Ambulation Distance (Feet): 240 Feet Assistive device: None Gait Pattern/deviations: Decreased stride length;Drifts right/left Gait velocity: decreased   General Gait Details: patient with noted instability during ambulation, able to self correct. Reports instability at baseline, ambulates ocassionally with use of SPC  Stairs Stairs: Yes Stairs assistance: Supervision Stair Management: One rail Left Number of Stairs: 12 General stair comments: reciprical steps up and step to on the way down. no physical assist required; supervision for safety and cues for upright posture  Wheelchair Mobility     Modified Rankin (Stroke Patients Only)       Balance Overall balance assessment: No apparent balance deficits (not formally assessed)                                           Pertinent Vitals/Pain Pain Assessment: Faces Faces Pain Scale: Hurts little more Pain Location: back Pain Descriptors / Indicators: Sore Pain Intervention(s): Monitored during session    Home Living Family/patient expects to be discharged to:: Private residence Living Arrangements: Spouse/significant other Available Help at Discharge: Family;Available 24 hours/day Type of Home: House Home Access: Stairs to enter Entrance Stairs-Rails: Right Entrance Stairs-Number of Steps: 3 Home Layout: One level Home Equipment: Cane - single point;Walker - 2 wheels;Hand held shower head      Prior Function Level of Independence: Independent;Independent with assistive device(s)         Comments: ocassional use of a cane, [place], has man came one step to enter     Hand Dominance   Dominant Hand: Right    Extremity/Trunk Assessment   Upper Extremity Assessment Upper Extremity Assessment: Defer to OT evaluation    Lower Extremity Assessment Lower Extremity Assessment: Overall WFL for tasks assessed       Communication   Communication: No difficulties  Cognition Arousal/Alertness: Awake/alert Behavior During Therapy: WFL for tasks assessed/performed Overall Cognitive Status: Within Functional Limits for tasks assessed  General Comments      Exercises     Assessment/Plan    PT Assessment Juan Johnson does not need any further PT services  PT Problem List         PT Treatment Interventions      PT Goals (Current goals can be found in the Care Plan section)  Acute Rehab PT Goals PT Goal Formulation: All assessment and education complete, DC therapy    Frequency     Barriers to discharge         Co-evaluation               AM-PAC PT "6 Clicks" Daily Activity  Outcome Measure Difficulty turning over in bed (including adjusting bedclothes, sheets and blankets)?: A Little Difficulty moving from lying on back to sitting on the side of the bed? : A Little Difficulty sitting down on and standing up from a chair with arms (e.g., wheelchair, bedside commode, etc,.)?: A Little Help needed moving to and from a bed to chair (including a wheelchair)?: A Little Help needed walking in hospital room?: A Little Help needed climbing 3-5 steps with a railing? : A Little 6 Click Score: 18    End of Session Equipment Utilized During Treatment: Back brace Activity Tolerance: Patient tolerated treatment well Patient left: in chair;with family/visitor present Nurse Communication: Mobility status PT Visit Diagnosis: Unsteadiness on feet (R26.81)    Time: 1000-1018 PT Time Calculation (min) (ACUTE ONLY): 18 min   Charges:   PT Evaluation $PT Eval Moderate Complexity: 1 Procedure     PT G Codes:        Alben Deeds, PT DPT  [phone]   Duncan Dull 02/15/2017, 10:24 AM

## 2017-02-16 ENCOUNTER — Encounter (HOSPITAL_COMMUNITY): Payer: Self-pay

## 2017-02-16 ENCOUNTER — Emergency Department (HOSPITAL_COMMUNITY): Payer: Medicare Other

## 2017-02-16 ENCOUNTER — Inpatient Hospital Stay (HOSPITAL_COMMUNITY)
Admission: EM | Admit: 2017-02-16 | Discharge: 2017-02-19 | DRG: 871 | Disposition: A | Discharge status: Home / Self Care | Payer: Medicare Other | Attending: Family Medicine | Admitting: Family Medicine

## 2017-02-16 DIAGNOSIS — I480 Paroxysmal atrial fibrillation: Secondary | ICD-10-CM | POA: Diagnosis not present

## 2017-02-16 DIAGNOSIS — M4317 Spondylolisthesis, lumbosacral region: Secondary | ICD-10-CM

## 2017-02-16 DIAGNOSIS — R410 Disorientation, unspecified: Secondary | ICD-10-CM | POA: Diagnosis not present

## 2017-02-16 DIAGNOSIS — Z8249 Family history of ischemic heart disease and other diseases of the circulatory system: Secondary | ICD-10-CM

## 2017-02-16 DIAGNOSIS — I4891 Unspecified atrial fibrillation: Secondary | ICD-10-CM | POA: Diagnosis present

## 2017-02-16 DIAGNOSIS — I10 Essential (primary) hypertension: Secondary | ICD-10-CM | POA: Diagnosis present

## 2017-02-16 DIAGNOSIS — I2510 Atherosclerotic heart disease of native coronary artery without angina pectoris: Secondary | ICD-10-CM | POA: Diagnosis present

## 2017-02-16 DIAGNOSIS — D649 Anemia, unspecified: Secondary | ICD-10-CM | POA: Diagnosis present

## 2017-02-16 DIAGNOSIS — R059 Cough, unspecified: Secondary | ICD-10-CM

## 2017-02-16 DIAGNOSIS — J440 Chronic obstructive pulmonary disease with acute lower respiratory infection: Secondary | ICD-10-CM | POA: Diagnosis present

## 2017-02-16 DIAGNOSIS — Z7901 Long term (current) use of anticoagulants: Secondary | ICD-10-CM

## 2017-02-16 DIAGNOSIS — A419 Sepsis, unspecified organism: Principal | ICD-10-CM | POA: Diagnosis present

## 2017-02-16 DIAGNOSIS — H547 Unspecified visual loss: Secondary | ICD-10-CM | POA: Diagnosis present

## 2017-02-16 DIAGNOSIS — Z87891 Personal history of nicotine dependence: Secondary | ICD-10-CM

## 2017-02-16 DIAGNOSIS — H409 Unspecified glaucoma: Secondary | ICD-10-CM | POA: Diagnosis present

## 2017-02-16 DIAGNOSIS — I119 Hypertensive heart disease without heart failure: Secondary | ICD-10-CM | POA: Diagnosis present

## 2017-02-16 DIAGNOSIS — R05 Cough: Secondary | ICD-10-CM

## 2017-02-16 DIAGNOSIS — K219 Gastro-esophageal reflux disease without esophagitis: Secondary | ICD-10-CM | POA: Diagnosis present

## 2017-02-16 DIAGNOSIS — R918 Other nonspecific abnormal finding of lung field: Secondary | ICD-10-CM | POA: Diagnosis not present

## 2017-02-16 DIAGNOSIS — I69398 Other sequelae of cerebral infarction: Secondary | ICD-10-CM

## 2017-02-16 DIAGNOSIS — F0390 Unspecified dementia without behavioral disturbance: Secondary | ICD-10-CM | POA: Diagnosis not present

## 2017-02-16 DIAGNOSIS — Z981 Arthrodesis status: Secondary | ICD-10-CM

## 2017-02-16 DIAGNOSIS — Z888 Allergy status to other drugs, medicaments and biological substances status: Secondary | ICD-10-CM

## 2017-02-16 DIAGNOSIS — F329 Major depressive disorder, single episode, unspecified: Secondary | ICD-10-CM | POA: Diagnosis present

## 2017-02-16 DIAGNOSIS — J189 Pneumonia, unspecified organism: Secondary | ICD-10-CM | POA: Diagnosis present

## 2017-02-16 DIAGNOSIS — G4733 Obstructive sleep apnea (adult) (pediatric): Secondary | ICD-10-CM | POA: Diagnosis present

## 2017-02-16 DIAGNOSIS — Z79899 Other long term (current) drug therapy: Secondary | ICD-10-CM

## 2017-02-16 DIAGNOSIS — Y95 Nosocomial condition: Secondary | ICD-10-CM | POA: Diagnosis present

## 2017-02-16 DIAGNOSIS — R0602 Shortness of breath: Secondary | ICD-10-CM | POA: Diagnosis not present

## 2017-02-16 DIAGNOSIS — J449 Chronic obstructive pulmonary disease, unspecified: Secondary | ICD-10-CM | POA: Diagnosis present

## 2017-02-16 DIAGNOSIS — M1990 Unspecified osteoarthritis, unspecified site: Secondary | ICD-10-CM | POA: Diagnosis present

## 2017-02-16 DIAGNOSIS — F419 Anxiety disorder, unspecified: Secondary | ICD-10-CM | POA: Diagnosis present

## 2017-02-16 LAB — CBC WITH DIFFERENTIAL/PLATELET
BASOS ABS: 0 10*3/uL (ref 0.0–0.1)
BASOS PCT: 0 %
EOS ABS: 0.2 10*3/uL (ref 0.0–0.7)
EOS PCT: 2 %
HEMATOCRIT: 31.8 % — AB (ref 39.0–52.0)
Hemoglobin: 10 g/dL — ABNORMAL LOW (ref 13.0–17.0)
Lymphocytes Relative: 14 %
Lymphs Abs: 1.9 10*3/uL (ref 0.7–4.0)
MCH: 26.3 pg (ref 26.0–34.0)
MCHC: 31.4 g/dL (ref 30.0–36.0)
MCV: 83.7 fL (ref 78.0–100.0)
MONO ABS: 1.2 10*3/uL — AB (ref 0.1–1.0)
Monocytes Relative: 9 %
NEUTROS ABS: 10.6 10*3/uL — AB (ref 1.7–7.7)
Neutrophils Relative %: 75 %
PLATELETS: 278 10*3/uL (ref 150–400)
RBC: 3.8 MIL/uL — ABNORMAL LOW (ref 4.22–5.81)
RDW: 14.7 % (ref 11.5–15.5)
WBC: 14 10*3/uL — ABNORMAL HIGH (ref 4.0–10.5)

## 2017-02-16 LAB — COMPREHENSIVE METABOLIC PANEL
ALK PHOS: 80 U/L (ref 38–126)
ALT: 15 U/L — AB (ref 17–63)
AST: 24 U/L (ref 15–41)
Albumin: 3.1 g/dL — ABNORMAL LOW (ref 3.5–5.0)
Anion gap: 9 (ref 5–15)
BILIRUBIN TOTAL: 0.7 mg/dL (ref 0.3–1.2)
BUN: 14 mg/dL (ref 6–20)
CALCIUM: 8.8 mg/dL — AB (ref 8.9–10.3)
CO2: 22 mmol/L (ref 22–32)
CREATININE: 1.1 mg/dL (ref 0.61–1.24)
Chloride: 103 mmol/L (ref 101–111)
Glucose, Bld: 113 mg/dL — ABNORMAL HIGH (ref 65–99)
Potassium: 3.6 mmol/L (ref 3.5–5.1)
Sodium: 134 mmol/L — ABNORMAL LOW (ref 135–145)
TOTAL PROTEIN: 6.6 g/dL (ref 6.5–8.1)

## 2017-02-16 LAB — PROTIME-INR
INR: 1.18
PROTHROMBIN TIME: 15.1 s (ref 11.4–15.2)

## 2017-02-16 LAB — I-STAT CG4 LACTIC ACID, ED: LACTIC ACID, VENOUS: 0.91 mmol/L (ref 0.5–1.9)

## 2017-02-16 MED ORDER — PIPERACILLIN-TAZOBACTAM 3.375 G IVPB 30 MIN
3.3750 g | Freq: Once | INTRAVENOUS | Status: AC
  Administered 2017-02-16: 3.375 g via INTRAVENOUS
  Filled 2017-02-16: qty 50

## 2017-02-16 MED ORDER — VANCOMYCIN HCL IN DEXTROSE 1-5 GM/200ML-% IV SOLN
1000.0000 mg | Freq: Once | INTRAVENOUS | Status: DC
  Filled 2017-02-16: qty 200

## 2017-02-16 MED ORDER — SODIUM CHLORIDE 0.9 % IV BOLUS (SEPSIS)
1000.0000 mL | Freq: Once | INTRAVENOUS | Status: AC
  Administered 2017-02-16: 1000 mL via INTRAVENOUS

## 2017-02-16 MED ORDER — VANCOMYCIN HCL 10 G IV SOLR
2000.0000 mg | Freq: Once | INTRAVENOUS | Status: AC
  Administered 2017-02-17: 2000 mg via INTRAVENOUS
  Filled 2017-02-16: qty 2000

## 2017-02-16 MED ORDER — SODIUM CHLORIDE 0.9 % IV BOLUS (SEPSIS)
1000.0000 mL | Freq: Once | INTRAVENOUS | Status: AC
  Administered 2017-02-17: 1000 mL via INTRAVENOUS

## 2017-02-16 NOTE — ED Notes (Signed)
Pt states took 1000mg  tylenol at 2100 tonight.

## 2017-02-16 NOTE — ED Notes (Signed)
Pt presents with a small amount of bright red blood/drainage from surgical incision site on back.

## 2017-02-16 NOTE — ED Triage Notes (Signed)
Pt states recent back surgery Friday. Pt febrile at triage. Pt states hx of afib. Pt has not had blood thinners x 1 week.  Pt altered at triage. Pt a/o x 3 at triage. Family states no new drainage or smell to bandage.

## 2017-02-16 NOTE — ED Provider Notes (Signed)
[facility] DEPT Provider Note   CSN: [id number] Arrival date & time: 02/16/17  2158     History   Chief Complaint Chief Complaint  Patient presents with  . Fever  . Post-op Problem  . Altered Mental Status    HPI Juan Johnson is a 66 y.o. male.  HPI   66 year old male with history of atrial fibrillation currently on Eliquis, CAD, dementia, stroke, COPD presenting for evaluation of fever. Pt recently had a decompressive laminectomy and fusion for treatment of spondylolisthesis at L5/S1 level, which was done by Dr. Nundkumar 2 days ago. The surgery went well, and he went home the next day. However for the past several days he has had a productive cough. This cough has increased in intensity. Earlier today he developed a fever, complaining of chills, family noticed that he is more confused than usual, and he also complaining of urinary urgency. Family noticed that his heart rate is faster than usual and thinks that he is in atrial fibrillation. He has not been on his echo was for the past week due to recent surgery, plan to resume tomorrow. He denies having any significant headache, neck pain, chest pain, difficulty breathing, abdominal pain, burning urination, new focal numbness or weakness, or rash. Family did note some new blood at the back surgical site that was not there yesterday. However patient denies any increasing back pain. No bowel bladder complaint.  Past Medical History:  Diagnosis Date  . Anemia   . Anxiety    claustrophobia  . Arthritis   . Complication of anesthesia    difficult to wake up after neck surgery  . COPD (chronic obstructive pulmonary disease) ([place])   . Dementia   . Depression   . Enlarged liver    20-25 years ago  . GERD (gastroesophageal reflux disease)   . Glaucoma   . Hemoptysis    a. 03/2016 in setting of PNA-->resolved.  . Hypertensive heart disease   . Mitral regurgitation    a. 09/2015 Echo: EF 55-60%, no rwma, mild to mod MR, PASP  23mmHg.  . Paroxysmal atrial flutter ([place])    a. Noted 09/2015 following back surgery-->Eliquis (CHA2DS2VASc = 4);  b. 03/2016 Recurrent PAFlutter in setting of PNA-->converted spont.  . 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 Pneumonia 04/2015   "bronchial" pneumonia   . Retinal tear of left eye   . Sleep apnea    does use cpap  . Stroke [facility]) ? 05/2014   peripheral vision loss    Patient Active Problem List   Diagnosis Date Noted  . Spondylolisthesis at L5-S1 level 02/14/2017  . Hypertensive heart disease   . Paroxysmal atrial flutter ([place])   . Dementia without behavioral disturbance 06/04/2016  . Dementia with behavioral disturbance 04/18/2016  . Atrial flutter ([place]) 01/11/2016  . Mitral valve regurgitation: mild to moderate 10/23/2015  . New onset atrial flutter ([place]) 10/04/2015  . Atrial fibrillation with rapid ventricular response ([place])   . Anemia 10/03/2015  . Stroke ([place])   . Spondylolisthesis of lumbar region 09/27/2015  . Lobar pneumonia due to unspecified organism 05/25/2015  . Snoring 03/16/2015  . Witnessed apneic spells 03/16/2015  . Obese 03/16/2015  . Morning headache 03/16/2015  . Excessive daytime sleepiness 03/16/2015  . COPD GOLD I 09/25/2014  . Upper airway cough syndrome vs cough variant asthma 08/22/2014  . Essential hypertension 08/22/2014  . Diplopia 06/25/2014  . Vision loss, bilateral 06/25/2014  .  Weakness 06/25/2014  . Unspecified hereditary and idiopathic peripheral neuropathy 06/25/2014    Past Surgical History:  Procedure Laterality Date  . BACK SURGERY  09/27/2015   [place]  . CARDIAC CATHETERIZATION  2003  . COLONOSCOPY    . EYE SURGERY  10/2007  . EYE SURGERY Bilateral 2018   Eyelid  . LEG SURGERY     AT AGE 74  . LUMBAR FUSION  09/27/2015  . NECK SURGERY  06/29/2010  . SHOULDER SURGERY  09/24/2011  . TONSILLECTOMY     AT AGE 41  . VASECTOMY         Home Medications    Prior to Admission medications     Medication Sig Start Date End Date Taking? Authorizing Provider  acetaminophen (TYLENOL) 500 MG tablet Take 1,000 mg by mouth every 4 (four) hours as needed for mild pain or moderate pain (Takes 2 every 4- 6 hours).     [provider]  albuterol (PROVENTIL HFA;VENTOLIN HFA) 108 (90 Base) MCG/ACT inhaler Inhale 2 puffs into the lungs every 6 (six) hours as needed for wheezing or shortness of breath.     [provider]  ALPRAZolam (XANAX) 0.5 MG tablet Take 1-2 30 minutes before MRi do not drive. May repeat if needed. 02/15/16   Melvenia Beam, MD  apixaban (ELIQUIS) 5 MG TABS tablet Take 1 tablet (5 mg total) by mouth 2 (two) times daily. 01/01/17   [place], Peter M, MD  BREO ELLIPTA 100-25 MCG/INH AEPB Take 1 puff by mouth daily. 02/13/16   [provider]  cetirizine (ZYRTEC) 10 MG tablet Take 10 mg by mouth daily.    [provider]  diltiazem (CARDIZEM CD) 240 MG 24 hr capsule Take 1 capsule (240 mg total) by mouth daily. 01/01/17   [place], Peter M, MD  donepezil (ARICEPT) 5 MG tablet Take 1 tablet (5 mg total) by mouth daily. 08/12/16   Melvenia Beam, MD  dorzolamide-timolol (COSOPT) 22.3-6.8 MG/ML ophthalmic solution Place 1 drop into the left eye 2 (two) times daily.    [provider]  ezetimibe (ZETIA) 10 MG tablet Take 10 mg by mouth daily.    [provider]  guaiFENesin (MUCINEX) 600 MG 12 hr tablet Take 600 mg by mouth 2 (two) times daily as needed for cough.    [provider]  ketorolac (ACULAR) 0.5 % ophthalmic solution Place 1 drop into the left eye 4 (four) times daily.  08/22/14   [provider]  latanoprost (XALATAN) 0.005 % ophthalmic solution Place 1 drop into both eyes at bedtime.     [provider]  memantine (NAMENDA) 10 MG tablet Start with one tab daily for 2 weeks and then increase to 1 tablet (10 mg total) by mouth 2 (two) times daily. 02/10/17   Melvenia Beam, MD  metoprolol succinate  (TOPROL XL) 25 MG 24 hr tablet Take 1 tablet (25 mg total) by mouth daily. 01/01/17   [place], Peter M, MD  montelukast (SINGULAIR) 10 MG tablet Take 10 mg by mouth at bedtime.  03/09/15   [provider]  Multiple Vitamins-Minerals (MULTIVITAMIN WITH MINERALS) tablet Take 1 tablet by mouth daily.    [provider]  niacin (NIASPAN) 1000 MG CR tablet Take 1,000 mg by mouth at bedtime.  03/09/15   [provider]  omeprazole (PRILOSEC) 20 MG capsule Take 20 mg by mouth 2 (two) times daily before a meal.  03/09/15   [provider]  sertraline (  ZOLOFT) 100 MG tablet Take 1 tablet (100 mg total) by mouth daily. Patient taking differently: Take 100 mg by mouth at bedtime.  08/12/16   Melvenia Beam, MD  timolol (BETIMOL) 0.5 % ophthalmic solution Place 1 drop into the left eye 2 (two) times daily.    [provider]  valsartan-hydrochlorothiazide (DIOVAN-HCT) 160-12.5 MG tablet Take 1 tablet by mouth daily. 01/01/17   [place], Peter M, MD    Family History Family History  Problem Relation Age of Onset  . Hypertension Mother   . Macular degeneration Mother   . Stroke Father   . Emphysema Father     smoked  . Diabetes    . Hypothyroidism    . Cancer    . Diabetes type II Brother   . Diabetes type II Brother   . Hyperthyroidism    . Cataracts    . Glaucoma      Social History Social History  Substance Use Topics  . Smoking status: Former Smoker    Packs/day: 3.00    Years: 23.00    Types: Cigarettes    Quit date: 10/12/1997  . Smokeless tobacco: Never Used  . Alcohol use No     Comment: OCC     Allergies   Ace inhibitors; Crestor [rosuvastatin]; and Simvastatin   Review of Systems Review of Systems  Unable to perform ROS: Mental status change     Physical Exam Updated Vital Signs BP 110/60   Pulse (!) 120   Temp (!) 101.4 F (38.6 C) (Oral)   Resp 16   SpO2 99%   Physical Exam  Constitutional: He appears well-developed  and well-nourished. No distress.  Elderly male, well-appearing, nontoxic in appearance  HENT:  Head: Atraumatic.  Mouth/Throat: Oropharynx is clear and moist.  Eyes: Conjunctivae are normal.  Neck: Neck supple. No JVD present.  Cardiovascular:  Irregularly irregular heart rhythm, no murmurs rubs or gallops  Pulmonary/Chest: Effort normal and breath sounds normal. He has no rales.  Abdominal: Soft. Bowel sounds are normal. He exhibits no distension. There is no tenderness.  Neurological: He is alert. He has normal strength. GCS eye subscore is 4. GCS verbal subscore is 5. GCS motor subscore is 6.  Patient is alert to self, place, but thought it was April instead of May.    Skin: No rash noted.  Well-appearing posterior lumbar spine surgical scar without signs of infection, no significant tenderness to palpation.  Psychiatric: He has a normal mood and affect.  Nursing note and vitals reviewed.    ED Treatments / Results  Labs (all labs ordered are listed, but only abnormal results are displayed) Labs Reviewed  COMPREHENSIVE METABOLIC PANEL - Abnormal; Notable for the following:       Result Value   Sodium 134 (*)    Glucose, Bld 113 (*)    Calcium 8.8 (*)    Albumin 3.1 (*)    ALT 15 (*)    All other components within normal limits  CBC WITH DIFFERENTIAL/PLATELET - Abnormal; Notable for the following:    WBC 14.0 (*)    RBC 3.80 (*)    Hemoglobin 10.0 (*)    HCT 31.8 (*)    Neutro Abs 10.6 (*)    Monocytes Absolute 1.2 (*)    All other components within normal limits  URINALYSIS, ROUTINE W REFLEX MICROSCOPIC - Abnormal; Notable for the following:    APPearance HAZY (*)    Ketones, ur 5 (*)    Protein, ur  100 (*)    Squamous Epithelial / LPF 0-5 (*)    All other components within normal limits  CULTURE, BLOOD (ROUTINE X 2)  CULTURE, BLOOD (ROUTINE X 2)  URINE CULTURE  PROTIME-INR  I-STAT CG4 LACTIC ACID, ED  I-STAT CG4 LACTIC ACID, ED    EKG  EKG  Interpretation  Date/Time:  Sunday Feb 16 2017 22:24:58 EDT Ventricular Rate:  100 PR Interval:    QRS Duration: 82 QT Interval:  328 QTC Calculation: 423 R Axis:   30 Text Interpretation:  Atrial fibrillation Abnormal ECG Confirmed by Alvino Chapel  MD, NATHAN [phone]) on 02/16/2017 11:11:45 PM       Radiology Dg Chest 2 View  Result Date: 02/16/2017 CLINICAL DATA:  Sepsis and fever.  Acute mental status change. EXAM: CHEST  2 VIEW COMPARISON:  March 25, 2016 FINDINGS: Suggested left retrocardiac opacity on the lateral view, similar since June 2017. The heart, hila, mediastinum, lungs, and pleura are otherwise normal. IMPRESSION: Left retrocardiac opacity on the lateral view, similar since 2017. Whether this represents chronic atelectasis or scarring or recurrent infiltrate is unclear. Electronically Signed   By: Dorise Bullion III M.D   On: 02/16/2017 23:51   Ct Angio Chest Pe W Or Wo Contrast  Result Date: 02/17/2017 CLINICAL DATA:  Back surgery on Friday. Now with shortness of breath and cough. EXAM: CT ANGIOGRAPHY CHEST WITH CONTRAST TECHNIQUE: Multidetector CT imaging of the chest was performed using the standard protocol during bolus administration of intravenous contrast. Multiplanar CT image reconstructions and MIPs were obtained to evaluate the vascular anatomy. CONTRAST:  100 mL Isovue 370 COMPARISON:  None. FINDINGS: Cardiovascular: Satisfactory opacification of the pulmonary arteries to the segmental level. No evidence of pulmonary embolism. Normal heart size. No pericardial effusion. Coronary artery calcifications. Mediastinum/Nodes: Scattered mediastinal lymph nodes are not pathologically enlarged. Esophagus is decompressed. No abnormal mediastinal fluid or gas collections. Lungs/Pleura: There is consolidation in the left lower lung with adjacent airspace disease. This likely indicates pneumonia. No pneumothorax. Small left pleural effusion. Upper Abdomen: No acute abnormality.  Musculoskeletal: Degenerative changes in the spine. No destructive bone lesions. Review of the MIP images confirms the above findings. IMPRESSION: 1. No evidence of significant pulmonary embolus. 2. Consolidation and airspace disease in the left lower lung with small left pleural effusion likely representing pneumonia. Electronically Signed   By: Lucienne Capers M.D.   On: 02/17/2017 01:29    Procedures Procedures (including critical care time)  Medications Ordered in ED Medications  vancomycin (VANCOCIN) 2,000 mg in sodium chloride 0.9 % 500 mL IVPB (2,000 mg Intravenous New Bag/Given 02/17/17 0017)  sodium chloride 0.9 % bolus 1,000 mL (not administered)  sodium chloride 0.9 % bolus 500 mL (not administered)  sodium chloride 0.9 % bolus 1,000 mL (1,000 mLs Intravenous New Bag/Given 02/16/17 2349)    And  sodium chloride 0.9 % bolus 1,000 mL (1,000 mLs Intravenous New Bag/Given 02/17/17 0010)  piperacillin-tazobactam (ZOSYN) IVPB 3.375 g (3.375 g Intravenous New Bag/Given 02/16/17 2354)  acetaminophen (TYLENOL) tablet 650 mg (650 mg Oral Given 02/17/17 0028)  iopamidol (ISOVUE-370) 76 % injection (100 mLs  Contrast Given 02/17/17 0100)     Initial Impression / Assessment and Plan / ED Course  I have reviewed the triage vital signs and the nursing notes.  Pertinent labs & imaging results that were available during my care of the patient were reviewed by me and considered in my medical decision making (see chart for details).     BP  130/60   Pulse (!) 106   Temp 99.3 F (37.4 C)   Resp 18   SpO2 98%    Final Clinical Impressions(s) / ED Diagnoses   Final diagnoses:  Cough  HCAP (healthcare-associated pneumonia)  Delirium    New Prescriptions New Prescriptions   No medications on file   11:16 PM Pt here with fever, cough, recent back surgery 2 days ago.  Does have a document temp of 101.4, hypotensive with SBP in the 90s, tachycardiac with HR 120s.  In afib.  Code sepsis initiated.   However since lactic acid is normal, will give 2L NS and monitor closely.  Broad spectrum abx started.  If no obvious source, will consider PE study since pt have been off Eliquis x 1 week.  Pt denies SOB.  Care discussed with Dr. Alvino Chapel.   2:10 AM Initial chest x-rays shows left retrocardiac opacity in the lateral view, unclear if there is infiltration. A follow-up for chest CT angiogram which showed no evidence of PE however there is a consolidation and air space disease in the left lower lung with small left pleural effusion likely represent pneumonia. Patient was initially treated with broad-spectrum antibiotic. He has received 2 L of IV fluid but still mildly tachycardic. This could be due to his facial fibrillation however patient will receive the full IV fluid resuscitation of 30 mL/kg.  Will consult for admission.  UA without evidence of UTI.    2:15 AM Appreciate consultation from [facility] Dr. Blaine Hamper who agrees to see and admit pt for further management of his lung infection.  Pt and family are aware and agrees with plan.    CRITICAL CARE Performed by: Domenic Moras Total critical care time: 35 minutes Critical care time was exclusive of separately billable procedures and treating other patients. Critical care was necessary to treat or prevent imminent or life-threatening deterioration. Critical care was time spent personally by me on the following activities: development of treatment plan with patient and/or surrogate as well as nursing, discussions with consultants, evaluation of patient's response to treatment, examination of patient, obtaining history from patient or surrogate, ordering and performing treatments and interventions, ordering and review of laboratory studies, ordering and review of radiographic studies, pulse oximetry and re-evaluation of patient's condition.    Domenic Moras, PA-C 02/17/17 [place]    Davonna Belling, MD 02/19/17 0900

## 2017-02-17 ENCOUNTER — Emergency Department (HOSPITAL_COMMUNITY): Payer: Medicare Other

## 2017-02-17 DIAGNOSIS — F329 Major depressive disorder, single episode, unspecified: Secondary | ICD-10-CM | POA: Diagnosis present

## 2017-02-17 DIAGNOSIS — H409 Unspecified glaucoma: Secondary | ICD-10-CM | POA: Diagnosis present

## 2017-02-17 DIAGNOSIS — R0602 Shortness of breath: Secondary | ICD-10-CM | POA: Diagnosis not present

## 2017-02-17 DIAGNOSIS — Z7901 Long term (current) use of anticoagulants: Secondary | ICD-10-CM | POA: Diagnosis not present

## 2017-02-17 DIAGNOSIS — H547 Unspecified visual loss: Secondary | ICD-10-CM | POA: Diagnosis present

## 2017-02-17 DIAGNOSIS — Y95 Nosocomial condition: Secondary | ICD-10-CM | POA: Diagnosis present

## 2017-02-17 DIAGNOSIS — Z888 Allergy status to other drugs, medicaments and biological substances status: Secondary | ICD-10-CM | POA: Diagnosis not present

## 2017-02-17 DIAGNOSIS — M1990 Unspecified osteoarthritis, unspecified site: Secondary | ICD-10-CM | POA: Diagnosis present

## 2017-02-17 DIAGNOSIS — I69398 Other sequelae of cerebral infarction: Secondary | ICD-10-CM | POA: Diagnosis not present

## 2017-02-17 DIAGNOSIS — Z8249 Family history of ischemic heart disease and other diseases of the circulatory system: Secondary | ICD-10-CM | POA: Diagnosis not present

## 2017-02-17 DIAGNOSIS — Z87891 Personal history of nicotine dependence: Secondary | ICD-10-CM | POA: Diagnosis not present

## 2017-02-17 DIAGNOSIS — I2510 Atherosclerotic heart disease of native coronary artery without angina pectoris: Secondary | ICD-10-CM | POA: Diagnosis present

## 2017-02-17 DIAGNOSIS — A419 Sepsis, unspecified organism: Secondary | ICD-10-CM | POA: Diagnosis not present

## 2017-02-17 DIAGNOSIS — D649 Anemia, unspecified: Secondary | ICD-10-CM | POA: Diagnosis present

## 2017-02-17 DIAGNOSIS — Z981 Arthrodesis status: Secondary | ICD-10-CM | POA: Diagnosis not present

## 2017-02-17 DIAGNOSIS — R05 Cough: Secondary | ICD-10-CM | POA: Diagnosis not present

## 2017-02-17 DIAGNOSIS — G4733 Obstructive sleep apnea (adult) (pediatric): Secondary | ICD-10-CM | POA: Diagnosis not present

## 2017-02-17 DIAGNOSIS — J189 Pneumonia, unspecified organism: Secondary | ICD-10-CM | POA: Diagnosis not present

## 2017-02-17 DIAGNOSIS — K219 Gastro-esophageal reflux disease without esophagitis: Secondary | ICD-10-CM | POA: Diagnosis not present

## 2017-02-17 DIAGNOSIS — J449 Chronic obstructive pulmonary disease, unspecified: Secondary | ICD-10-CM | POA: Diagnosis not present

## 2017-02-17 DIAGNOSIS — J440 Chronic obstructive pulmonary disease with acute lower respiratory infection: Secondary | ICD-10-CM | POA: Diagnosis present

## 2017-02-17 DIAGNOSIS — M4317 Spondylolisthesis, lumbosacral region: Secondary | ICD-10-CM | POA: Diagnosis present

## 2017-02-17 DIAGNOSIS — I480 Paroxysmal atrial fibrillation: Secondary | ICD-10-CM | POA: Diagnosis present

## 2017-02-17 DIAGNOSIS — I4891 Unspecified atrial fibrillation: Secondary | ICD-10-CM | POA: Diagnosis not present

## 2017-02-17 DIAGNOSIS — Z79899 Other long term (current) drug therapy: Secondary | ICD-10-CM | POA: Diagnosis not present

## 2017-02-17 DIAGNOSIS — I10 Essential (primary) hypertension: Secondary | ICD-10-CM | POA: Diagnosis not present

## 2017-02-17 DIAGNOSIS — F0390 Unspecified dementia without behavioral disturbance: Secondary | ICD-10-CM | POA: Diagnosis not present

## 2017-02-17 DIAGNOSIS — I119 Hypertensive heart disease without heart failure: Secondary | ICD-10-CM | POA: Diagnosis present

## 2017-02-17 DIAGNOSIS — F419 Anxiety disorder, unspecified: Secondary | ICD-10-CM | POA: Diagnosis present

## 2017-02-17 LAB — I-STAT CG4 LACTIC ACID, ED: LACTIC ACID, VENOUS: 0.63 mmol/L (ref 0.5–1.9)

## 2017-02-17 LAB — URINALYSIS, ROUTINE W REFLEX MICROSCOPIC
Bacteria, UA: NONE SEEN
Bilirubin Urine: NEGATIVE
Glucose, UA: NEGATIVE mg/dL
Hgb urine dipstick: NEGATIVE
KETONES UR: 5 mg/dL — AB
Leukocytes, UA: NEGATIVE
NITRITE: NEGATIVE
PH: 5 (ref 5.0–8.0)
PROTEIN: 100 mg/dL — AB
Specific Gravity, Urine: 1.025 (ref 1.005–1.030)

## 2017-02-17 LAB — EXPECTORATED SPUTUM ASSESSMENT W REFEX TO RESP CULTURE

## 2017-02-17 LAB — STREP PNEUMONIAE URINARY ANTIGEN: STREP PNEUMO URINARY ANTIGEN: NEGATIVE

## 2017-02-17 LAB — INFLUENZA PANEL BY PCR (TYPE A & B)
Influenza A By PCR: NEGATIVE
Influenza B By PCR: NEGATIVE

## 2017-02-17 LAB — EXPECTORATED SPUTUM ASSESSMENT W GRAM STAIN, RFLX TO RESP C

## 2017-02-17 LAB — HIV ANTIBODY (ROUTINE TESTING W REFLEX): HIV Screen 4th Generation wRfx: NONREACTIVE

## 2017-02-17 MED ORDER — AZITHROMYCIN 500 MG PO TABS
250.0000 mg | ORAL_TABLET | Freq: Every day | ORAL | Status: DC
  Administered 2017-02-18 – 2017-02-19 (×2): 250 mg via ORAL
  Filled 2017-02-17 (×2): qty 1

## 2017-02-17 MED ORDER — LEVALBUTEROL HCL 1.25 MG/0.5ML IN NEBU: 1.2500 mg | INHALATION_SOLUTION | Freq: Four times a day (QID) | RESPIRATORY_TRACT | Status: DC

## 2017-02-17 MED ORDER — ONDANSETRON HCL 4 MG/2ML IJ SOLN: 4.0000 mg | Freq: Three times a day (TID) | INTRAMUSCULAR | Status: DC | PRN

## 2017-02-17 MED ORDER — IPRATROPIUM BROMIDE 0.02 % IN SOLN: 0.5000 mg | Freq: Four times a day (QID) | RESPIRATORY_TRACT | Status: DC

## 2017-02-17 MED ORDER — ZOLPIDEM TARTRATE 5 MG PO TABS: 5.0000 mg | ORAL_TABLET | Freq: Every evening | ORAL | Status: DC | PRN

## 2017-02-17 MED ORDER — IPRATROPIUM BROMIDE 0.02 % IN SOLN
0.5000 mg | Freq: Four times a day (QID) | RESPIRATORY_TRACT | Status: DC
  Administered 2017-02-17: 0.5 mg via RESPIRATORY_TRACT
  Filled 2017-02-17: qty 2.5

## 2017-02-17 MED ORDER — APIXABAN 5 MG PO TABS
5.0000 mg | ORAL_TABLET | Freq: Two times a day (BID) | ORAL | Status: DC
  Administered 2017-02-18 – 2017-02-19 (×3): 5 mg via ORAL
  Filled 2017-02-17 (×3): qty 1

## 2017-02-17 MED ORDER — METOPROLOL SUCCINATE ER 25 MG PO TB24
25.0000 mg | ORAL_TABLET | Freq: Every day | ORAL | Status: DC
Start: 2017-02-17 — End: 2017-02-19
  Administered 2017-02-17 – 2017-02-19 (×3): 25 mg via ORAL
  Filled 2017-02-17 (×4): qty 1

## 2017-02-17 MED ORDER — IPRATROPIUM-ALBUTEROL 0.5-2.5 (3) MG/3ML IN SOLN: 3.0000 mL | Freq: Four times a day (QID) | RESPIRATORY_TRACT | Status: DC | PRN

## 2017-02-17 MED ORDER — SODIUM CHLORIDE 0.9 % IV SOLN
INTRAVENOUS | Status: AC
  Administered 2017-02-17: 15:00:00 via INTRAVENOUS

## 2017-02-17 MED ORDER — MONTELUKAST SODIUM 10 MG PO TABS
10.0000 mg | ORAL_TABLET | Freq: Every day | ORAL | Status: DC
  Administered 2017-02-17 – 2017-02-18 (×2): 10 mg via ORAL
  Filled 2017-02-17 (×2): qty 1

## 2017-02-17 MED ORDER — GUAIFENESIN-DM 100-10 MG/5ML PO SYRP
5.0000 mL | ORAL_SOLUTION | ORAL | Status: DC | PRN
  Administered 2017-02-17 – 2017-02-18 (×2): 5 mL via ORAL
  Filled 2017-02-17 (×2): qty 5

## 2017-02-17 MED ORDER — DORZOLAMIDE HCL-TIMOLOL MAL 2-0.5 % OP SOLN
1.0000 [drp] | Freq: Two times a day (BID) | OPHTHALMIC | Status: DC
  Administered 2017-02-17 – 2017-02-19 (×5): 1 [drp] via OPHTHALMIC
  Filled 2017-02-17: qty 10

## 2017-02-17 MED ORDER — ADULT MULTIVITAMIN W/MINERALS CH
1.0000 | ORAL_TABLET | Freq: Every day | ORAL | Status: DC
  Administered 2017-02-17 – 2017-02-19 (×3): 1 via ORAL
  Filled 2017-02-17 (×3): qty 1

## 2017-02-17 MED ORDER — SODIUM CHLORIDE 0.9 % IV BOLUS (SEPSIS)
1000.0000 mL | Freq: Once | INTRAVENOUS | Status: AC
  Administered 2017-02-17: 1000 mL via INTRAVENOUS

## 2017-02-17 MED ORDER — IOPAMIDOL (ISOVUE-370) INJECTION 76%
INTRAVENOUS | Status: AC
  Administered 2017-02-17: 100 mL
  Filled 2017-02-17: qty 100

## 2017-02-17 MED ORDER — DONEPEZIL HCL 5 MG PO TABS
5.0000 mg | ORAL_TABLET | Freq: Every day | ORAL | Status: DC
  Administered 2017-02-17 – 2017-02-19 (×3): 5 mg via ORAL
  Filled 2017-02-17 (×3): qty 1

## 2017-02-17 MED ORDER — FLUTICASONE FUROATE-VILANTEROL 100-25 MCG/INH IN AEPB
1.0000 | INHALATION_SPRAY | Freq: Every day | RESPIRATORY_TRACT | Status: DC
  Administered 2017-02-17 – 2017-02-19 (×3): 1 via RESPIRATORY_TRACT
  Filled 2017-02-17: qty 28

## 2017-02-17 MED ORDER — LATANOPROST 0.005 % OP SOLN
1.0000 [drp] | Freq: Every day | OPHTHALMIC | Status: DC
  Administered 2017-02-17 – 2017-02-18 (×2): 1 [drp] via OPHTHALMIC
  Filled 2017-02-17: qty 2.5

## 2017-02-17 MED ORDER — KETOROLAC TROMETHAMINE 0.5 % OP SOLN
1.0000 [drp] | Freq: Four times a day (QID) | OPHTHALMIC | Status: DC
  Administered 2017-02-17 – 2017-02-19 (×9): 1 [drp] via OPHTHALMIC
  Filled 2017-02-17: qty 3

## 2017-02-17 MED ORDER — SODIUM CHLORIDE 0.9 % IV SOLN
INTRAVENOUS | Status: DC
  Administered 2017-02-17: 08:00:00 via INTRAVENOUS

## 2017-02-17 MED ORDER — NIACIN ER (ANTIHYPERLIPIDEMIC) 500 MG PO TBCR
1000.0000 mg | EXTENDED_RELEASE_TABLET | Freq: Every day | ORAL | Status: DC
  Administered 2017-02-17 – 2017-02-18 (×2): 1000 mg via ORAL
  Filled 2017-02-17 (×2): qty 2

## 2017-02-17 MED ORDER — TIMOLOL HEMIHYDRATE 0.5 % OP SOLN: 1.0000 [drp] | Freq: Two times a day (BID) | OPHTHALMIC | Status: DC

## 2017-02-17 MED ORDER — APIXABAN 5 MG PO TABS
5.0000 mg | ORAL_TABLET | Freq: Two times a day (BID) | ORAL | Status: DC
  Filled 2017-02-17: qty 1

## 2017-02-17 MED ORDER — SODIUM CHLORIDE 0.9 % IV BOLUS (SEPSIS)
500.0000 mL | Freq: Once | INTRAVENOUS | Status: AC
  Administered 2017-02-17: 500 mL via INTRAVENOUS

## 2017-02-17 MED ORDER — VANCOMYCIN HCL IN DEXTROSE 750-5 MG/150ML-% IV SOLN
750.0000 mg | Freq: Two times a day (BID) | INTRAVENOUS | Status: DC
  Administered 2017-02-17 (×2): 750 mg via INTRAVENOUS
  Filled 2017-02-17 (×3): qty 150

## 2017-02-17 MED ORDER — EZETIMIBE 10 MG PO TABS
10.0000 mg | ORAL_TABLET | Freq: Every day | ORAL | Status: DC
  Administered 2017-02-17 – 2017-02-19 (×3): 10 mg via ORAL
  Filled 2017-02-17 (×3): qty 1

## 2017-02-17 MED ORDER — AZITHROMYCIN 500 MG PO TABS
500.0000 mg | ORAL_TABLET | Freq: Every day | ORAL | Status: AC
  Administered 2017-02-17: 500 mg via ORAL
  Filled 2017-02-17: qty 1

## 2017-02-17 MED ORDER — ACETAMINOPHEN 325 MG PO TABS
650.0000 mg | ORAL_TABLET | Freq: Once | ORAL | Status: AC
Start: 2017-02-17 — End: 2017-02-17
  Administered 2017-02-17: 650 mg via ORAL
  Filled 2017-02-17: qty 2

## 2017-02-17 MED ORDER — MEMANTINE HCL 10 MG PO TABS
10.0000 mg | ORAL_TABLET | Freq: Two times a day (BID) | ORAL | Status: DC
Start: 2017-02-17 — End: 2017-02-19
  Administered 2017-02-17 – 2017-02-19 (×5): 10 mg via ORAL
  Filled 2017-02-17 (×5): qty 1

## 2017-02-17 MED ORDER — SERTRALINE HCL 100 MG PO TABS
100.0000 mg | ORAL_TABLET | Freq: Every day | ORAL | Status: DC
  Administered 2017-02-17 – 2017-02-18 (×2): 100 mg via ORAL
  Filled 2017-02-17 (×2): qty 1

## 2017-02-17 MED ORDER — ACETAMINOPHEN 325 MG PO TABS
650.0000 mg | ORAL_TABLET | Freq: Four times a day (QID) | ORAL | Status: DC | PRN
  Administered 2017-02-17: 650 mg via ORAL
  Filled 2017-02-17: qty 2

## 2017-02-17 MED ORDER — PIPERACILLIN-TAZOBACTAM 3.375 G IVPB
3.3750 g | Freq: Three times a day (TID) | INTRAVENOUS | Status: DC
  Administered 2017-02-17 – 2017-02-19 (×7): 3.375 g via INTRAVENOUS
  Filled 2017-02-17 (×9): qty 50

## 2017-02-17 MED ORDER — DILTIAZEM HCL ER COATED BEADS 240 MG PO CP24
240.0000 mg | ORAL_CAPSULE | Freq: Every day | ORAL | Status: DC
  Administered 2017-02-17 – 2017-02-19 (×3): 240 mg via ORAL
  Filled 2017-02-17 (×3): qty 1

## 2017-02-17 MED ORDER — LEVALBUTEROL HCL 1.25 MG/0.5ML IN NEBU
1.2500 mg | INHALATION_SOLUTION | Freq: Four times a day (QID) | RESPIRATORY_TRACT | Status: DC
  Administered 2017-02-17: 1.25 mg via RESPIRATORY_TRACT
  Filled 2017-02-17: qty 0.5

## 2017-02-17 MED ORDER — PANTOPRAZOLE SODIUM 40 MG PO TBEC
40.0000 mg | DELAYED_RELEASE_TABLET | Freq: Every day | ORAL | Status: DC
  Administered 2017-02-17 – 2017-02-19 (×3): 40 mg via ORAL
  Filled 2017-02-17 (×3): qty 1

## 2017-02-17 NOTE — Evaluation (Signed)
Physical Therapy Evaluation Patient Details Name: Juan Johnson MRN: [id number] DOB: 01/03/1951 Today's Date: 02/17/2017   History of Present Illness  Pt adm with PNA after recent lumbar fusion (5/4).  PMH - HTN, arthritis, CVA, peripheral vision loss, anxiety, depression, copd, dementia  Clinical Impression  Pt admitted with above diagnosis and presents to PT with functional limitations due to deficits listed below (See PT problem list). Pt needs skilled PT to maximize independence and safety to allow discharge to home with wife. Expect pt will make good progress and be able to return home. Slightly unsteady with gait and currently using a walker.      Follow Up Recommendations No PT follow up;Supervision - Intermittent    Equipment Recommendations  None recommended by PT    Recommendations for Other Services       Precautions / Restrictions Precautions Precautions: Back Required Braces or Orthoses: Spinal Brace Spinal Brace: Lumbar corset;Applied in standing position Restrictions Weight Bearing Restrictions: No      Mobility  Bed Mobility               General bed mobility comments: Pt sitting edge of bed  Transfers Overall transfer level: Needs assistance Equipment used: Rolling walker (2 wheeled) Transfers: Sit to/from Stand Sit to Stand: Min guard         General transfer comment: Assist for safety  Ambulation/Gait Ambulation/Gait assistance: Min guard Ambulation Distance (Feet): 250 Feet Assistive device: Rolling walker (2 wheeled) Gait Pattern/deviations: Step-through pattern;Decreased stride length;Drifts right/left Gait velocity: decr Gait velocity interpretation: Below normal speed for age/gender General Gait Details: Assist for balance and safety. Pt bumping objects with wheels of walker likely due to decr peripheral vision  Stairs            Wheelchair Mobility    Modified Rankin (Stroke Patients Only)       Balance Overall  balance assessment: Needs assistance Sitting-balance support: No upper extremity supported;Feet supported Sitting balance-Leahy Scale: Normal     Standing balance support: No upper extremity supported Standing balance-Leahy Scale: Good                               Pertinent Vitals/Pain Pain Assessment: No/denies pain    Home Living Family/patient expects to be discharged to:: Private residence Living Arrangements: Spouse/significant other Available Help at Discharge: Family;Available 24 hours/day Type of Home: House Home Access: Stairs to enter Entrance Stairs-Rails: Right Entrance Stairs-Number of Steps: 3 Home Layout: One level Home Equipment: Cane - single point;Walker - 2 wheels;Hand held shower head      Prior Function Level of Independence: Independent;Independent with assistive device(s)         Comments: Using walker at times since surgery     Hand Dominance   Dominant Hand: Right    Extremity/Trunk Assessment   Upper Extremity Assessment Upper Extremity Assessment: Defer to OT evaluation    Lower Extremity Assessment Lower Extremity Assessment: Overall WFL for tasks assessed       Communication   Communication: No difficulties  Cognition Arousal/Alertness: Awake/alert Behavior During Therapy: WFL for tasks assessed/performed Overall Cognitive Status: No family/caregiver present to determine baseline cognitive functioning                                 General Comments: Decr short term memory      General Comments  Exercises     Assessment/Plan    PT Assessment Patient needs continued PT services  PT Problem List Decreased balance;Decreased mobility;Decreased knowledge of use of DME       PT Treatment Interventions DME instruction;Gait training;Stair training;Functional mobility training;Therapeutic activities;Therapeutic exercise;Balance training;Patient/family education    PT Goals (Current goals can  be found in the Care Plan section)  Acute Rehab PT Goals Patient Stated Goal: go home PT Goal Formulation: With patient Time For Goal Achievement: 02/24/17 Potential to Achieve Goals: Good    Frequency Min 3X/week   Barriers to discharge        Co-evaluation               AM-PAC PT "6 Clicks" Daily Activity  Outcome Measure Difficulty turning over in bed (including adjusting bedclothes, sheets and blankets)?: A Little Difficulty moving from lying on back to sitting on the side of the bed? : A Little Difficulty sitting down on and standing up from a chair with arms (e.g., wheelchair, bedside commode, etc,.)?: A Little Help needed moving to and from a bed to chair (including a wheelchair)?: A Little Help needed walking in hospital room?: A Little Help needed climbing 3-5 steps with a railing? : A Little 6 Click Score: 18    End of Session Equipment Utilized During Treatment: Back brace Activity Tolerance: Patient tolerated treatment well Patient left: in chair;with call bell/phone within reach;with chair alarm set;with family/visitor present Nurse Communication: Mobility status PT Visit Diagnosis: Unsteadiness on feet (R26.81)    Time: 2706-2376 PT Time Calculation (min) (ACUTE ONLY): 32 min   Charges:   PT Evaluation $PT Eval Moderate Complexity: 1 Procedure     PT G CodesMarland Kitchen        [facility] [place] 02/17/2017, 11:23 AM

## 2017-02-17 NOTE — H&P (Signed)
History and Physical    Juan Johnson OZD:[id number] DOB: [birth year] DOA: 02/16/2017  Referring MD/NP/PA:   PCP: Curly Rim, MD   Patient coming from:  The patient is coming from home.  At baseline, pt is independent for most of ADL.   Chief Complaint: Cough, shortness breath, fever  HPI: Juan Johnson is a 67 y.o. male with medical history significant of COPD, GERD, depression, anxiety, stroke, OSA on CPAP, PAF on Eliquis, mitral valve regurgitation, dementia, anemia, who presents with cough, shortness breath and fever.  Pt had a decompressive laminectomy and fusion for treatment of spondylolisthesis at L5/S1 level, which was done by Dr. Nundkumar 2 days ago. The surgery went well, and he went home the next day. Pt states that the surgical site pain is mild. There is minimal amount of bright red blood drainage from surgical incision site on back. Pt states that he developed cough, fever and chills today. He has mild SOB. He coughs up "cloudy" sputum. No CP. No nausea, vomiting, diarrhea or AP. No unilaterally weakness. Family noticed that he is more confused than usual. His mental status improved after receiving 3 L IV fluid. Currently he is oriented 3. He has not been on his Eliquis for past week due to recent surgery, plan is to resume Eliquis tomorrow (5/7) per pt  ED Course: pt was found to have WBC 14.0, lactate of 0.91, negative urinalysis, electrolytes renal function okay, temperature 101.4, tachycardia, tachypnea, O2 sat 95% on room air, soft blood pressure. CT angiogram is negative for PE, but showed infiltration in LLL.  the patient is admitted to telemetry bed as inpatient.   Review of Systems:   General: has fevers, chills, no changes in body weight, has fatigue HEENT: no blurry vision, hearing changes or sore throat Respiratory: has dyspnea, coughing, no wheezing CV: no chest pain, no palpitations GI: no nausea, vomiting, abdominal pain, diarrhea, constipation GU: no  dysuria, burning on urination, increased urinary frequency, hematuria  Ext: no leg edema Neuro: no unilateral weakness, numbness, or tingling, no vision change or hearing loss Skin: no rash, no skin tear. MSK: has back pain Heme: No easy bruising.  Travel history: No recent long distant travel.  Allergy:  Allergies  Allergen Reactions  . Ace Inhibitors Cough  . Crestor [Rosuvastatin] Other (See Comments)    MYALGIAS CRAMPS MUSCLE WEAKNESS  . Simvastatin Other (See Comments)    MYALGIAS CRAMPS MUSCLE WEAKNESS    Past Medical History:  Diagnosis Date  . Anemia   . Anxiety    claustrophobia  . Arthritis   . Complication of anesthesia    difficult to wake up after neck surgery  . COPD (chronic obstructive pulmonary disease) ([place])   . Dementia   . Depression   . Enlarged liver    20-25 years ago  . GERD (gastroesophageal reflux disease)   . Glaucoma   . Hemoptysis    a. 03/2016 in setting of PNA-->resolved.  . Hypertensive heart disease   . Mitral regurgitation    a. 09/2015 Echo: EF 55-60%, no rwma, mild to mod MR, PASP 47mmHg.  . Paroxysmal atrial flutter ([place])    a. Noted 09/2015 following back surgery-->Eliquis (CHA2DS2VASc = 4);  b. 03/2016 Recurrent PAFlutter in setting of PNA-->converted spont.  . 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 Pneumonia 04/2015   "bronchial" pneumonia   . Retinal tear of left eye   . Sleep apnea  does use cpap  . Stroke [facility]) ? 05/2014   peripheral vision loss    Past Surgical History:  Procedure Laterality Date  . BACK SURGERY  09/27/2015   [place]  . CARDIAC CATHETERIZATION  2003  . COLONOSCOPY    . EYE SURGERY  10/2007  . EYE SURGERY Bilateral 2018   Eyelid  . LEG SURGERY     AT AGE 80  . LUMBAR FUSION  09/27/2015  . NECK SURGERY  06/29/2010  . SHOULDER SURGERY  09/24/2011  . TONSILLECTOMY     AT AGE 53  . VASECTOMY      Social History:  reports that he quit smoking about 19 years ago. His smoking  use included Cigarettes. He has a 69.00 pack-year smoking history. He has never used smokeless tobacco. He reports that he does not drink alcohol or use drugs.  Family History:  Family History  Problem Relation Age of Onset  . Hypertension Mother   . Macular degeneration Mother   . Stroke Father   . Emphysema Father     smoked  . Diabetes    . Hypothyroidism    . Cancer    . Diabetes type II Brother   . Diabetes type II Brother   . Hyperthyroidism    . Cataracts    . Glaucoma       Prior to Admission medications   Medication Sig Start Date End Date Taking? Authorizing Provider  acetaminophen (TYLENOL) 500 MG tablet Take 1,000 mg by mouth every 4 (four) hours as needed for mild pain or moderate pain (Takes 2 every 4- 6 hours).    Yes [provider]  albuterol (PROVENTIL HFA;VENTOLIN HFA) 108 (90 Base) MCG/ACT inhaler Inhale 2 puffs into the lungs every 6 (six) hours as needed for wheezing or shortness of breath.    Yes [provider]  apixaban (ELIQUIS) 5 MG TABS tablet Take 1 tablet (5 mg total) by mouth 2 (two) times daily. 01/01/17  Yes [place], Peter M, MD  BREO ELLIPTA 100-25 MCG/INH AEPB Take 1 puff by mouth daily. 02/13/16  Yes [provider]  diltiazem (CARDIZEM CD) 240 MG 24 hr capsule Take 1 capsule (240 mg total) by mouth daily. 01/01/17  Yes [place], Peter M, MD  donepezil (ARICEPT) 5 MG tablet Take 1 tablet (5 mg total) by mouth daily. 08/12/16  Yes Melvenia Beam, MD  dorzolamide-timolol (COSOPT) 22.3-6.8 MG/ML ophthalmic solution Place 1 drop into the left eye 2 (two) times daily.   Yes [provider]  ezetimibe (ZETIA) 10 MG tablet Take 10 mg by mouth daily.   Yes [provider]  ketorolac (ACULAR) 0.5 % ophthalmic solution Place 1 drop into the left eye 4 (four) times daily.  08/22/14  Yes [provider]  latanoprost (XALATAN) 0.005 % ophthalmic solution Place 1 drop into both eyes at bedtime.    Yes [provider]  memantine (NAMENDA) 10 MG tablet Start with one tab daily for 2 weeks and then increase to 1 tablet (10 mg total) by mouth 2 (two) times daily. Patient taking differently: Take 10 mg by mouth See admin instructions. Take 1 tablet at bedtime for 2 weeks and then increase to 1 tablet (10 mg total) by mouth 2 (two) times daily. 02/10/17  Yes Melvenia Beam, MD  metoprolol succinate (TOPROL XL) 25 MG 24 hr tablet Take 1 tablet (25 mg total) by mouth daily. 01/01/17  Yes [place], Peter M, MD  montelukast (SINGULAIR) 10  MG tablet Take 10 mg by mouth at bedtime.  03/09/15  Yes [provider]  Multiple Vitamins-Minerals (MULTIVITAMIN WITH MINERALS) tablet Take 1 tablet by mouth daily.   Yes [provider]  niacin (NIASPAN) 1000 MG CR tablet Take 1,000 mg by mouth at bedtime.  03/09/15  Yes [provider]  omeprazole (PRILOSEC) 20 MG capsule Take 20 mg by mouth 2 (two) times daily before a meal.  03/09/15  Yes [provider]  sertraline (ZOLOFT) 100 MG tablet Take 1 tablet (100 mg total) by mouth daily. Patient taking differently: Take 100 mg by mouth at bedtime.  08/12/16  Yes Melvenia Beam, MD  timolol (BETIMOL) 0.5 % ophthalmic solution Place 1 drop into the left eye 2 (two) times daily.   Yes [provider]  valsartan-hydrochlorothiazide (DIOVAN-HCT) 160-12.5 MG tablet Take 1 tablet by mouth daily. 01/01/17  Yes [place], Peter M, MD  ALPRAZolam Duanne Moron) 0.5 MG tablet Take 1-2 30 minutes before MRi do not drive. May repeat if needed. Patient not taking: Reported on 02/17/2017 02/15/16   Melvenia Beam, MD    Physical Exam: Vitals:   02/17/17 0230 02/17/17 0303 02/17/17 0330 02/17/17 0443  BP: 125/78  134/88 129/89  Pulse: 89  (!) 104 84  Resp: 19  20 18   Temp:    98.5 F (36.9 C)  TempSrc:    Oral  SpO2: 96% 98% 97% 97%   General: Not in acute distress HEENT:       Eyes: PERRL, EOMI, no scleral icterus.       ENT: No discharge from  the ears and nose, no pharynx injection, no tonsillar enlargement.        Neck: No JVD, no bruit, no mass felt. Heme: No neck lymph node enlargement. Cardiac: S1/S2, RRR, No murmurs, No gallops or rubs. Respiratory:  No rales, wheezing, rhonchi or rubs. GI: Soft, nondistended, nontender, no rebound pain, no organomegaly, BS present. GU: No hematuria Ext: No pitting leg edema bilaterally. 2+DP/PT pulse bilaterally. Musculoskeletal: has lower back tenderness. s/p of surgery, with minimal amount of bright red blood drainage from surgical incision site on back. No signs of infection. Skin: No rashes. Neuro: Alert, oriented X3, cranial nerves II-XII grossly intact, moves all extremities normally. Psych: Patient is not psychotic, no suicidal or hemocidal ideation.  Labs on Admission: I have personally reviewed following labs and imaging studies  CBC:  Recent Labs Lab 02/16/17 2232  WBC 14.0*  NEUTROABS 10.6*  HGB 10.0*  HCT 31.8*  MCV 83.7  PLT 767   Basic Metabolic Panel:  Recent Labs Lab 02/16/17 2232  NA 134*  K 3.6  CL 103  CO2 22  GLUCOSE 113*  BUN 14  CREATININE 1.10  CALCIUM 8.8*   GFR: Estimated Creatinine Clearance: 74.2 mL/min (by C-G formula based on SCr of 1.1 mg/dL). Liver Function Tests:  Recent Labs Lab 02/16/17 2232  AST 24  ALT 15*  ALKPHOS 80  BILITOT 0.7  PROT 6.6  ALBUMIN 3.1*   No results for input(s): LIPASE, AMYLASE in the last 168 hours. No results for input(s): AMMONIA in the last 168 hours. Coagulation Profile:  Recent Labs Lab 02/16/17 2232  INR 1.18   Cardiac Enzymes: No results for input(s): CKTOTAL, CKMB, CKMBINDEX, TROPONINI in the last 168 hours. BNP (last 3 results) No results for input(s): PROBNP in the last 8760 hours. HbA1C: No results for input(s): HGBA1C in the last 72 hours. CBG: No results for input(s): GLUCAP in  the last 168 hours. Lipid Profile: No results for input(s): CHOL, HDL, LDLCALC, TRIG, CHOLHDL,  LDLDIRECT in the last 72 hours. Thyroid Function Tests: No results for input(s): TSH, T4TOTAL, FREET4, T3FREE, THYROIDAB in the last 72 hours. Anemia Panel: No results for input(s): VITAMINB12, FOLATE, FERRITIN, TIBC, IRON, RETICCTPCT in the last 72 hours. Urine analysis:    Component Value Date/Time   COLORURINE YELLOW 02/17/2017 0028   APPEARANCEUR HAZY (A) 02/17/2017 0028   LABSPEC 1.025 02/17/2017 0028   PHURINE 5.0 02/17/2017 0028   GLUCOSEU NEGATIVE 02/17/2017 0028   HGBUR NEGATIVE 02/17/2017 0028   BILIRUBINUR NEGATIVE 02/17/2017 0028   KETONESUR 5 (A) 02/17/2017 0028   PROTEINUR 100 (A) 02/17/2017 0028   UROBILINOGEN 0.2 05/26/2010 0959   NITRITE NEGATIVE 02/17/2017 0028   LEUKOCYTESUR NEGATIVE 02/17/2017 0028   Sepsis Labs: @LABRCNTIP (procalcitonin:4,lacticidven:4) )No results found for this or any previous visit (from the past 240 hour(s)).   Radiological Exams on Admission: Dg Chest 2 View  Result Date: 02/16/2017 CLINICAL DATA:  Sepsis and fever.  Acute mental status change. EXAM: CHEST  2 VIEW COMPARISON:  March 25, 2016 FINDINGS: Suggested left retrocardiac opacity on the lateral view, similar since June 2017. The heart, hila, mediastinum, lungs, and pleura are otherwise normal. IMPRESSION: Left retrocardiac opacity on the lateral view, similar since 2017. Whether this represents chronic atelectasis or scarring or recurrent infiltrate is unclear. Electronically Signed   By: Dorise Bullion III M.D   On: 02/16/2017 23:51   Ct Angio Chest Pe W Or Wo Contrast  Result Date: 02/17/2017 CLINICAL DATA:  Back surgery on Friday. Now with shortness of breath and cough. EXAM: CT ANGIOGRAPHY CHEST WITH CONTRAST TECHNIQUE: Multidetector CT imaging of the chest was performed using the standard protocol during bolus administration of intravenous contrast. Multiplanar CT image reconstructions and MIPs were obtained to evaluate the vascular anatomy. CONTRAST:  100 mL Isovue 370 COMPARISON:   None. FINDINGS: Cardiovascular: Satisfactory opacification of the pulmonary arteries to the segmental level. No evidence of pulmonary embolism. Normal heart size. No pericardial effusion. Coronary artery calcifications. Mediastinum/Nodes: Scattered mediastinal lymph nodes are not pathologically enlarged. Esophagus is decompressed. No abnormal mediastinal fluid or gas collections. Lungs/Pleura: There is consolidation in the left lower lung with adjacent airspace disease. This likely indicates pneumonia. No pneumothorax. Small left pleural effusion. Upper Abdomen: No acute abnormality. Musculoskeletal: Degenerative changes in the spine. No destructive bone lesions. Review of the MIP images confirms the above findings. IMPRESSION: 1. No evidence of significant pulmonary embolus. 2. Consolidation and airspace disease in the left lower lung with small left pleural effusion likely representing pneumonia. Electronically Signed   By: Lucienne Capers M.D.   On: 02/17/2017 01:29     EKG: Independently reviewed. Atrial fibrillation, QTC 423, no ischemic change.   Assessment/Plan Principal Problem:   HCAP (healthcare-associated pneumonia) Active Problems:   Essential hypertension   COPD GOLD I   Atrial fibrillation with rapid ventricular response (HCC)   Dementia without behavioral disturbance   Spondylolisthesis at L5-S1 level   GERD (gastroesophageal reflux disease)   OSA (obstructive sleep apnea)   Sepsis ([place])   HCAP and sepsis: Patient has a productive cough, fever, plus chest x-ray findings of left lower lobe infiltration, consistent with HCAP Patient meets criteria for sepsis with leukocytosis, fever, tachycardia and tachypnea. Lactate is normal. Hemodynamically stable currently.  - Will admit to telemetry bed as inpt - IV Vancomycin, Zosyn - add Z pack - Mucinex for cough  - Atrovent Nebs and  Xopenex Neb prn for SOB - Urine legionella and S. pneumococcal antigen - Follow up blood culture  x2, sputum culture,  plus Flu pcr - IVF: 3L of NS bolus in ED, followed by 75 mL per hour of NS   COPD: stable -Breathing treatment as above -continue Singulair and Breo Ellipta inhaler  Essential hypertension: -hold Diovan-HCTZ due to soft Bp -Continue metoprolol and Cardizem  Atrial fibrillation with rapid ventricular response ([place]): CHA2DS2-VASc Score is 3, needs oral anticoagulation. Patient was on Eliquis which was on hold due to surgery. Plan is to start today. Heart rate 100-120s. -will start Eliquis today -continue metoprolol and Cardizem  Dementia without behavioral disturbance -continue Namenda and donepezil  OSA: -CPAP  GERD: -Protonix  Depression: Stable, no suicidal or homicidal ideations. -Continue home medications: Zoloft   Spondylolisthesis at L5-S1 level: s/p of surgery. Surgery site seems to be healing well. Has mild back pain. -prn tylenol   DVT ppx: On Eliquis  Code Status: Full code Family Communication: Yes, patient's wife and daughter at bed side Disposition Plan:  Anticipate discharge back to previous home environment Consults called:  none Admission status: Inpatient/tele    Date of Service 02/17/2017    Ivor Costa [facility] Pager [phone]  If 7PM-7AM, please contact night-coverage [email or web address] Password [email or web address] 02/17/2017, 5:55 AM

## 2017-02-17 NOTE — Evaluation (Signed)
Occupational Therapy Evaluation Patient Details Name: Juan Johnson MRN: [id number] DOB: 11/19/1950 Today's Date: 02/17/2017    History of Present Illness Pt adm with PNA after recent lumbar fusion (5/4).  PMH - HTN, arthritis, CVA, peripheral vision loss, anxiety, depression, copd, dementia   Clinical Impression   Pt is a 66 y/o M who presents with the above. Pt presents below baseline with ADLs and functional mobility with greatest deficits in LB dressing and LB bathing. Pt will benefit from continued OT services while in acute setting to increase safety and independence with ADLs and functional mobility after discharge home. Goals are for MinA to ModI.     Follow Up Recommendations  No OT follow up;Supervision - Intermittent    Equipment Recommendations  None recommended by OT           Precautions / Restrictions Precautions Precautions: Back Precaution Comments: verbally reviewed back precautions with Pt Required Braces or Orthoses: Spinal Brace Spinal Brace: Lumbar corset;Applied in standing position Restrictions Weight Bearing Restrictions: No      Mobility Bed Mobility Overal bed mobility: Needs Assistance Bed Mobility: Supine to Sit     Supine to sit: Min guard;HOB elevated Sit to supine: Min guard      Transfers Overall transfer level: Needs assistance Equipment used: Rolling walker (2 wheeled) Transfers: Sit to/from Stand Sit to Stand: Min guard         General transfer comment: for safety; verbal cues for hand placement    Balance Overall balance assessment: Needs assistance Sitting-balance support: No upper extremity supported;Feet supported Sitting balance-Leahy Scale: Good     Standing balance support: During functional activity;No upper extremity supported Standing balance-Leahy Scale: Good                             ADL either performed or assessed with clinical judgement   ADL Overall ADL's : Needs  assistance/impaired Eating/Feeding: Set up;Sitting   Grooming: Wash/dry hands;Min guard;Standing   Upper Body Bathing: Min guard;Sitting   Lower Body Bathing: Minimal assistance;Sit to/from stand   Upper Body Dressing : Min guard;Sitting   Lower Body Dressing: Moderate assistance;Sit to/from stand   Toilet Transfer: Min guard;Ambulation;Comfort height toilet;RW   Toileting- Water quality scientist and Hygiene: Min guard;Sit to/from stand       Functional mobility during ADLs: Min guard;Rolling walker                           Pertinent Vitals/Pain Pain Assessment: Faces Faces Pain Scale: Hurts a little bit Pain Location: back Pain Descriptors / Indicators: Sore Pain Intervention(s): Limited activity within patient's tolerance;Monitored during session     Hand Dominance Right   Extremity/Trunk Assessment Upper Extremity Assessment Upper Extremity Assessment: Overall WFL for tasks assessed           Communication Communication Communication: No difficulties   Cognition Arousal/Alertness: Awake/alert Behavior During Therapy: WFL for tasks assessed/performed Overall Cognitive Status: Within Functional Limits for tasks assessed                                 General Comments: Decr short term memory; some confusion noted during session   General Comments                  Home Living Family/patient expects to be discharged to:: Private residence Living Arrangements: Spouse/significant other  Available Help at Discharge: Family;Available 24 hours/day Type of Home: House Home Access: Stairs to enter [employer] of Steps: 3 Entrance Stairs-Rails: Right Home Layout: One level     Bathroom Shower/Tub: Occupational psychologist: Handicapped height     Home Equipment: [place] - single point;Walker - 2 wheels;Hand held shower head          Prior Functioning/Environment Level of Independence: Independent;Independent  with assistive device(s)        Comments: Using walker at times since surgery        OT Problem List: Decreased activity tolerance;Decreased strength      OT Treatment/Interventions: Self-care/ADL training;DME and/or AE instruction;Patient/family education;Therapeutic activities    OT Goals(Current goals can be found in the care plan section) Acute Rehab OT Goals Patient Stated Goal: go home OT Goal Formulation: With patient Time For Goal Achievement: 02/24/17 Potential to Achieve Goals: Good ADL Goals Pt Will Perform Grooming: with modified independence;standing Pt Will Perform Lower Body Dressing: with min assist;with adaptive equipment;sit to/from stand (AE PRN) Pt Will Transfer to Toilet: with supervision;ambulating;regular height toilet Pt Will Perform Toileting - Clothing Manipulation and hygiene: with supervision;sit to/from stand  OT Frequency: Min 2X/week                             AM-PAC PT "6 Clicks" Daily Activity     Outcome Measure Help from another person eating meals?: None Help from another person taking care of personal grooming?: A Little Help from another person toileting, which includes using toliet, bedpan, or urinal?: A Little Help from another person bathing (including washing, rinsing, drying)?: A Little Help from another person to put on and taking off regular upper body clothing?: A Little Help from another person to put on and taking off regular lower body clothing?: A Lot 6 Click Score: 18   End of Session Equipment Utilized During Treatment: Gait belt;Rolling walker;Back brace Nurse Communication: Mobility status  Activity Tolerance: Patient tolerated treatment well Patient left: in bed;with call bell/phone within reach;with bed alarm set;with family/visitor present  OT Visit Diagnosis: Muscle weakness (generalized) (M62.81)                Time: 6808-8110 OT Time Calculation (min): 25 min Charges:  OT General Charges $OT Visit:  1 Procedure OT Evaluation $OT Eval Low Complexity: 1 Procedure G-Codes:     Juan Johnson, OT Pager [phone] 02/17/2017   Juan Johnson 02/17/2017, 3:52 PM

## 2017-02-17 NOTE — Progress Notes (Signed)
Pharmacy Antibiotic Note  Juan Johnson is a 66 y.o. male admitted on 02/16/2017 with sepsis. Pt with post-op fever (s/p spinal surgery 5/4).  Pharmacy has been consulted for Vancomycin and Zosyn dosing. Pt also on Azithromycin.  Vanc 2gm and Zosyn 3.375gm given in ED ~0000  Plan: Zosyn 3.375gm IV q8h - doses over 4 hours Vancomycin 750mg  IV q12h Will f/u micro data, renal function, and pt's clinical condition Vanc trough at Css      Temp (24hrs), Avg:100.4 F (38 C), Min:99.3 F (37.4 C), Max:101.4 F (38.6 C)   Recent Labs Lab 02/16/17 2232 02/16/17 2241 02/17/17 0157  WBC 14.0*  --   --   CREATININE 1.10  --   --   LATICACIDVEN  --  0.91 0.63    Estimated Creatinine Clearance: 74.2 mL/min (by C-G formula based on SCr of 1.1 mg/dL).    Allergies  Allergen Reactions  . Ace Inhibitors Cough  . Crestor [Rosuvastatin] Other (See Comments)    MYALGIAS CRAMPS MUSCLE WEAKNESS  . Simvastatin Other (See Comments)    MYALGIAS CRAMPS MUSCLE WEAKNESS    Antimicrobials this admission: 5/7 Vanc >>  5/7 Zosyn >>   Dose adjustments this admission: n/a  Microbiology results: 5/6 BCx x2:  5/6 UCx:    Thank you for allowing pharmacy to be a part of this patient's care.  Sherlon Handing, PharmD, BCPS Clinical pharmacist, pager [phone] 02/17/2017 4:07 AM

## 2017-02-17 NOTE — Progress Notes (Signed)
RT came to do neb tx. Pt unavailable at this time

## 2017-02-17 NOTE — Progress Notes (Signed)
PROGRESS NOTE   Juan Johnson  PQZ:[id number]    DOB: [birth year]    DOA: 02/16/2017  PCP: Curly Rim, MD   I have briefly reviewed patients previous medical records in [facility].  Brief Narrative:  66 year old male, PMH of COPD, former smoker, OSA on nightly CPAP, PAF on Eliquis (held for surgery since 02/11/17), anxiety, depression, dementia, GERD, stroke, recent hospitalization 02/14/17-02/15/17 when he underwent decompressive laminectomy and fusion L5-S1, presented with productive cough, fever, dyspnea, chills and confusion. CTA chest was negative for PE but confirmed LLL pneumonia. He met sepsis criteria on admission.   Assessment & Plan:   Principal Problem:   HCAP (healthcare-associated pneumonia) Active Problems:   Essential hypertension   COPD GOLD I   Atrial fibrillation with rapid ventricular response (HCC)   Dementia without behavioral disturbance   Spondylolisthesis at L5-S1 level   GERD (gastroesophageal reflux disease)   OSA (obstructive sleep apnea)   Sepsis ([place])   1. Sepsis secondary to HCAP, LLL: Met sepsis criteria on admission. Treated per protocol with IV fluids and started empirically on IV vancomycin, Zosyn and azithromycin. Sputum culture, urine Legionella antigen, HIV antibody: Pending. Flu panel PCR negative. Urine streptococcal antigen negative. Continue supportive care. 2. Transient confusion: Likely related to acute infectious etiology complicating underlying dementia. No focal deficits on clinical exam. Resolved. 3. COPD: Stable without clinical bronchospasm. 4. OSA: Continue nightly CPAP. 5. Essential hypertension: Holding Diovan-HCTZ due to initial soft blood pressures. Continue metoprolol and Cardizem. 6. Paroxysmal A. fib with RVR: Patient had RVR in the 100-120s in the ED likely due to acute infection. Continue metoprolol and Cardizem. Ventricular rate has since been controlled. CHA2DS2-VASc Score is 3. Eliquis had been held for surgery to be  resumed on 5/8 as discussed with neurosurgeon at bedside. 7. Anemia: Stable. Follow CBCs. 8. Dementia without behavioral disturbance: Mental status back to baseline. Continue Namenda and Aricept. 9. GERD: Protonix 10. Anxiety & depression: Stable without suicidal or homicidal ideations or audiovisual hallucinations. Continue Zoloft. 11. Status post recent decompressive laminectomy and fusion L5-S1: Patient seen at bedside along with neurosurgeon. Stable without complications.   DVT prophylaxis: SCDs. Eliquis to begin 02/18/17. Code Status: Full Family Communication: Discussed in detail with patient's daughter via speaker phone. Updated care and answered questions. Disposition: DC home, possibly in the next 48 hours.   Consultants:  Neurosurgery   Procedures:  None  Antimicrobials:  IV vancomycin, Zosyn and azithromycin    Subjective: Feels much better. Breathing has significantly improved but not yet at baseline. Mild dry cough. Fevers are better. No chest pain reported.   ROS: Denies dizziness or lightheadedness. As per daughter, mild drainage from lower part of surgical site.  Objective:  Vitals:   02/17/17 0303 02/17/17 0330 02/17/17 0443 02/17/17 0955  BP:  134/88 129/89 (!) 128/47  Pulse:  (!) 104 84 73  Resp:  20 18   Temp:   98.5 F (36.9 C)   TempSrc:   Oral   SpO2: 98% 97% 97%     Examination:  General exam: Pleasant middle-aged male sitting up comfortably in bed. Does not look septic or toxic at this time. Respiratory system: Diminished breath sounds with occasional crackles in the left base. Otherwise clear to auscultation without wheezing or rhonchi. Respiratory effort normal. Cardiovascular system: S1 & S2 heard, RRR. No JVD, murmurs, rubs, gallops or clicks. No pedal edema. Telemetry: A. fib with controlled ventricular rate. Gastrointestinal system: Abdomen is nondistended, soft and nontender. No organomegaly  or masses felt. Normal bowel sounds  heard. Central nervous system: Alert and oriented 3. No focal neurological deficits. Extremities: Symmetric 5 x 5 power. Skin: No rashes, lesions or ulcers. Lumbar midline surgical site dressing mildly soiled with blood but no acute findings. Psychiatry: Judgement and insight appear normal. Mood & affect appropriate.     Data Reviewed: I have personally reviewed following labs and imaging studies  CBC:  Recent Labs Lab 02/16/17 2232  WBC 14.0*  NEUTROABS 10.6*  HGB 10.0*  HCT 31.8*  MCV 83.7  PLT 448   Basic Metabolic Panel:  Recent Labs Lab 02/16/17 2232  NA 134*  K 3.6  CL 103  CO2 22  GLUCOSE 113*  BUN 14  CREATININE 1.10  CALCIUM 8.8*   Liver Function Tests:  Recent Labs Lab 02/16/17 2232  AST 24  ALT 15*  ALKPHOS 80  BILITOT 0.7  PROT 6.6  ALBUMIN 3.1*   Coagulation Profile:  Recent Labs Lab 02/16/17 2232  INR 1.18     Recent Results (from the past 240 hour(s))  Culture, sputum-assessment     Status: None   Collection Time: 02/17/17 10:07 AM  Result Value Ref Range Status   Specimen Description EXPECTORATED SPUTUM  Final   Special Requests NONE  Final   Sputum evaluation THIS SPECIMEN IS ACCEPTABLE FOR SPUTUM CULTURE  Final   Report Status 02/17/2017 FINAL  Final  Culture, respiratory (NON-Expectorated)     Status: None (Preliminary result)   Collection Time: 02/17/17 10:07 AM  Result Value Ref Range Status   Specimen Description EXPECTORATED SPUTUM  Final   Special Requests NONE Reflexed from [id number]  Final   Gram Stain   Final    ABUNDANT WBC PRESENT, PREDOMINANTLY PMN RARE SQUAMOUS EPITHELIAL CELLS PRESENT MODERATE GRAM POSITIVE COCCI IN PAIRS RARE GRAM POSITIVE RODS RARE BUDDING YEAST SEEN    Culture PENDING  Incomplete   Report Status PENDING  Incomplete         Radiology Studies: Dg Chest 2 View  Result Date: 02/16/2017 CLINICAL DATA:  Sepsis and fever.  Acute mental status change. EXAM: CHEST  2 VIEW COMPARISON:  March 25, 2016 FINDINGS: Suggested left retrocardiac opacity on the lateral view, similar since June 2017. The heart, hila, mediastinum, lungs, and pleura are otherwise normal. IMPRESSION: Left retrocardiac opacity on the lateral view, similar since 2017. Whether this represents chronic atelectasis or scarring or recurrent infiltrate is unclear. Electronically Signed   By: Dorise Bullion III M.D   On: 02/16/2017 23:51   Ct Angio Chest Pe W Or Wo Contrast  Result Date: 02/17/2017 CLINICAL DATA:  Back surgery on Friday. Now with shortness of breath and cough. EXAM: CT ANGIOGRAPHY CHEST WITH CONTRAST TECHNIQUE: Multidetector CT imaging of the chest was performed using the standard protocol during bolus administration of intravenous contrast. Multiplanar CT image reconstructions and MIPs were obtained to evaluate the vascular anatomy. CONTRAST:  100 mL Isovue 370 COMPARISON:  None. FINDINGS: Cardiovascular: Satisfactory opacification of the pulmonary arteries to the segmental level. No evidence of pulmonary embolism. Normal heart size. No pericardial effusion. Coronary artery calcifications. Mediastinum/Nodes: Scattered mediastinal lymph nodes are not pathologically enlarged. Esophagus is decompressed. No abnormal mediastinal fluid or gas collections. Lungs/Pleura: There is consolidation in the left lower lung with adjacent airspace disease. This likely indicates pneumonia. No pneumothorax. Small left pleural effusion. Upper Abdomen: No acute abnormality. Musculoskeletal: Degenerative changes in the spine. No destructive bone lesions. Review of the MIP images confirms the above findings.  IMPRESSION: 1. No evidence of significant pulmonary embolus. 2. Consolidation and airspace disease in the left lower lung with small left pleural effusion likely representing pneumonia. Electronically Signed   By: Lucienne Capers M.D.   On: 02/17/2017 01:29        Scheduled Meds: . [START ON 02/18/2017] apixaban  5 mg Oral BID  .  [START ON 02/18/2017] azithromycin  250 mg Oral Daily  . diltiazem  240 mg Oral Daily  . donepezil  5 mg Oral Daily  . dorzolamide-timolol  1 drop Left Eye BID  . ezetimibe  10 mg Oral Daily  . fluticasone furoate-vilanterol  1 puff Inhalation Daily  . ipratropium  0.5 mg Nebulization Q6H  . ketorolac  1 drop Left Eye QID  . latanoprost  1 drop Both Eyes QHS  . levalbuterol  1.25 mg Nebulization Q6H  . memantine  10 mg Oral BID  . metoprolol succinate  25 mg Oral Daily  . montelukast  10 mg Oral QHS  . multivitamin with minerals  1 tablet Oral Daily  . niacin  1,000 mg Oral QHS  . pantoprazole  40 mg Oral Daily  . sertraline  100 mg Oral QHS   Continuous Infusions: . sodium chloride 75 mL/hr at 02/17/17 0747  . piperacillin-tazobactam (ZOSYN)  IV Stopped (02/17/17 1149)  . vancomycin Stopped (02/17/17 1403)     LOS: 0 days     Rada Zegers, MD, FACP, FHM. [facility] Pager [phone]  If 7PM-7AM, please contact night-coverage [email or web address] Password [email or web address] 02/17/2017, 2:10 PM

## 2017-02-17 NOTE — ED Notes (Signed)
Lolita for update on pt room assignment.

## 2017-02-18 LAB — CBC
HEMATOCRIT: 28.5 % — AB (ref 39.0–52.0)
Hemoglobin: 9.2 g/dL — ABNORMAL LOW (ref 13.0–17.0)
MCH: 27.2 pg (ref 26.0–34.0)
MCHC: 32.3 g/dL (ref 30.0–36.0)
MCV: 84.3 fL (ref 78.0–100.0)
Platelets: 268 10*3/uL (ref 150–400)
RBC: 3.38 MIL/uL — ABNORMAL LOW (ref 4.22–5.81)
RDW: 15 % (ref 11.5–15.5)
WBC: 13.1 10*3/uL — AB (ref 4.0–10.5)

## 2017-02-18 LAB — BASIC METABOLIC PANEL
ANION GAP: 7 (ref 5–15)
BUN: 8 mg/dL (ref 6–20)
CHLORIDE: 106 mmol/L (ref 101–111)
CO2: 25 mmol/L (ref 22–32)
Calcium: 8.4 mg/dL — ABNORMAL LOW (ref 8.9–10.3)
Creatinine, Ser: 0.82 mg/dL (ref 0.61–1.24)
GFR calc Af Amer: >60 mL/min (ref >60)
GLUCOSE: 123 mg/dL — AB (ref 65–99)
POTASSIUM: 4 mmol/L (ref 3.5–5.1)
Sodium: 138 mmol/L (ref 135–145)

## 2017-02-18 LAB — LEGIONELLA PNEUMOPHILA SEROGP 1 UR AG: L. pneumophila Serogp 1 Ur Ag: NEGATIVE

## 2017-02-18 LAB — URINE CULTURE: Culture: NO GROWTH

## 2017-02-18 MED ORDER — GUAIFENESIN ER 600 MG PO TB12
1200.0000 mg | ORAL_TABLET | Freq: Two times a day (BID) | ORAL | Status: DC
  Administered 2017-02-18 – 2017-02-19 (×3): 1200 mg via ORAL
  Filled 2017-02-18 (×3): qty 2

## 2017-02-18 NOTE — Progress Notes (Signed)
PROGRESS NOTE   Juan Johnson  TDV:[id number]    DOB: February 06, 1951    DOA: 02/16/2017  PCP: Juan Rim, MD   I have briefly reviewed patients previous medical records in [facility].  Brief Narrative:  66 year old male, PMH of COPD, former smoker, OSA on nightly CPAP, PAF on Eliquis (held for surgery since 02/11/17), anxiety, depression, dementia, GERD, stroke, recent hospitalization 02/14/17-02/15/17 when he underwent decompressive laminectomy and fusion L5-S1, presented with productive cough, fever, dyspnea, chills and confusion. CTA chest was negative for PE but confirmed LLL pneumonia. He met sepsis criteria on admission.   Assessment & Plan:   Principal Problem:   HCAP (healthcare-associated pneumonia) Active Problems:   Essential hypertension   COPD GOLD I   Atrial fibrillation with rapid ventricular response (HCC)   Dementia without behavioral disturbance   Spondylolisthesis at L5-S1 level   GERD (gastroesophageal reflux disease)   OSA (obstructive sleep apnea)   Sepsis ([place])   1. Sepsis secondary to HCAP, LLL: Met sepsis criteria on admission. Treated per protocol with IV fluids and started empirically on IV vancomycin, Zosyn and azithromycin.Blood cultures with no growth to date, sputum culture still pending Sputum culture, urine Legionella antigen still pending, otherwise negative HIV antibody, negative influenza panel, negative streptococcal antigen,, given negative blood cultures will DC IV vancomycin and continue with Zosyn and azithromycin, hopefully can be transitioned to oral antibiotic in 1-2 days, encouraged to use incentive spirometry, has been ordered for follow-up for him, started on Mucinex. 2. Transient confusion: Likely related to acute infectious etiology complicating underlying dementia. No focal deficits on clinical exam. Resolved, mentation back to baseline. 3. COPD: Table without bronchospasm or active wheezing 4. OSA: Continue nightly CPAP. 5. Essential  hypertension:  Continue metoprolol and Cardizem. Blood pressure started to increase, may need to resume Diovan-hydrochlorothiazide tomorrow. 6. Paroxysmal A. fib with RVR: Patient had RVR in the 100-120s in the ED likely due to acute infection. Continue metoprolol and Cardizem. Ventricular rate has since been controlled. CHA2DS2-VASc Score is 3. Eliquis had been held for surgery , and was resumed today has Dr. Algis Liming  discussion with neurosurgery . 7. Anemia: Stable. Follow CBCs. 8. Dementia without behavioral disturbance: Mental status back to baseline. Continue Namenda and Aricept. 9. GERD: Protonix 10. Anxiety & depression: Stable without suicidal or homicidal ideations or audiovisual hallucinations. Continue Zoloft. 11. Status post recent decompressive laminectomy and fusion L5-S1: Discussed with neurosurgery Dr. Kathyrn Sheriff , dressing can be removed and patient may shower .Patient seen at bedside along with neurosurgeon. Stable without complications.   DVT prophylaxis: SCDs. Eliquis started 02/18/17. Code Status: Full Family Communication: Discussed with wife at bedside Disposition: DC home, possibly in the next 24-48 hours.   Consultants:  Neurosurgery via phone  Procedures:  None  Antimicrobials:  IV vancomycin, Zosyn and azithromycin    Subjective: Reports is feeling much better today, breathing significantly improved, denies any significant shortness of breath, reports occasional cough, nonproductive, still with fever over last 24 hours with MAXIMUM TEMPERATURE 100.7 .  Objective:  Vitals:   02/18/17 0558 02/18/17 0940 02/18/17 0945 02/18/17 0959  BP: (!) 143/64 (!) 140/59    Pulse: 61  66   Resp: 18     Temp: 98.8 F (37.1 C)     TempSrc: Oral     SpO2: 100%   96%    Examination:  General exam: Pleasant male sitting in bed in no apparent distress  Respiratory system: Good air entry bilaterally, no wheezing, mites  Rales in left base, negative accessory muscle  . Cardiovascular system: S1-S2, no rubs murmurs or gallops, regular rate and rhythm, no edema . Gastrointestinal system: Abdomen is nondistended, soft and nontender. No organomegaly or masses felt. Normal bowel sounds heard. Central nervous system: Alert and oriented 3. No focal neurological deficits. Extremities: Symmetric 5 x 5 power. Skin: No rashes, lesions or ulcers. Lumbar midline surgical spine dressing with mild dried blood in the lower portion, otherwise no active bleed or discharge . Psychiatry: Judgement and insight appear normal. Mood & affect appropriate.     Data Reviewed: I have personally reviewed following labs and imaging studies  CBC:  Recent Labs Lab 02/16/17 2232 02/18/17 0555  WBC 14.0* 13.1*  NEUTROABS 10.6*  --   HGB 10.0* 9.2*  HCT 31.8* 28.5*  MCV 83.7 84.3  PLT 278 500   Basic Metabolic Panel:  Recent Labs Lab 02/16/17 2232 02/18/17 0555  NA 134* 138  K 3.6 4.0  CL 103 106  CO2 22 25  GLUCOSE 113* 123*  BUN 14 8  CREATININE 1.10 0.82  CALCIUM 8.8* 8.4*   Liver Function Tests:  Recent Labs Lab 02/16/17 2232  AST 24  ALT 15*  ALKPHOS 80  BILITOT 0.7  PROT 6.6  ALBUMIN 3.1*   Coagulation Profile:  Recent Labs Lab 02/16/17 2232  INR 1.18     Recent Results (from the past 240 hour(s))  Culture, blood (Routine x 2)     Status: None (Preliminary result)   Collection Time: 02/16/17 10:30 PM  Result Value Ref Range Status   Specimen Description BLOOD LEFT ANTECUBITAL  Final   Special Requests   Final    BOTTLES DRAWN AEROBIC AND ANAEROBIC Blood Culture adequate volume   Culture NO GROWTH < 24 HOURS  Final   Report Status PENDING  Incomplete  Culture, blood (Routine x 2)     Status: None (Preliminary result)   Collection Time: 02/16/17 10:45 PM  Result Value Ref Range Status   Specimen Description BLOOD RIGHT ANTECUBITAL  Final   Special Requests   Final    BOTTLES DRAWN AEROBIC AND ANAEROBIC Blood Culture adequate volume    Culture NO GROWTH < 24 HOURS  Final   Report Status PENDING  Incomplete  Urine culture     Status: None   Collection Time: 02/17/17 12:28 AM  Result Value Ref Range Status   Specimen Description URINE, RANDOM  Final   Special Requests NONE  Final   Culture NO GROWTH  Final   Report Status 02/18/2017 FINAL  Final  Culture, sputum-assessment     Status: None   Collection Time: 02/17/17 10:07 AM  Result Value Ref Range Status   Specimen Description EXPECTORATED SPUTUM  Final   Special Requests NONE  Final   Sputum evaluation THIS SPECIMEN IS ACCEPTABLE FOR SPUTUM CULTURE  Final   Report Status 02/17/2017 FINAL  Final  Culture, respiratory (NON-Expectorated)     Status: None (Preliminary result)   Collection Time: 02/17/17 10:07 AM  Result Value Ref Range Status   Specimen Description EXPECTORATED SPUTUM  Final   Special Requests NONE Reflexed from [id number]  Final   Gram Stain   Final    ABUNDANT WBC PRESENT, PREDOMINANTLY PMN RARE SQUAMOUS EPITHELIAL CELLS PRESENT MODERATE GRAM POSITIVE COCCI IN PAIRS RARE GRAM POSITIVE RODS RARE BUDDING YEAST SEEN    Culture CULTURE REINCUBATED FOR BETTER GROWTH  Final   Report Status PENDING  Incomplete  Radiology Studies: Dg Chest 2 View  Result Date: 02/16/2017 CLINICAL DATA:  Sepsis and fever.  Acute mental status change. EXAM: CHEST  2 VIEW COMPARISON:  March 25, 2016 FINDINGS: Suggested left retrocardiac opacity on the lateral view, similar since June 2017. The heart, hila, mediastinum, lungs, and pleura are otherwise normal. IMPRESSION: Left retrocardiac opacity on the lateral view, similar since 2017. Whether this represents chronic atelectasis or scarring or recurrent infiltrate is unclear. Electronically Signed   By: Dorise Bullion III M.D   On: 02/16/2017 23:51   Ct Angio Chest Pe W Or Wo Contrast  Result Date: 02/17/2017 CLINICAL DATA:  Back surgery on Friday. Now with shortness of breath and cough. EXAM: CT ANGIOGRAPHY CHEST  WITH CONTRAST TECHNIQUE: Multidetector CT imaging of the chest was performed using the standard protocol during bolus administration of intravenous contrast. Multiplanar CT image reconstructions and MIPs were obtained to evaluate the vascular anatomy. CONTRAST:  100 mL Isovue 370 COMPARISON:  None. FINDINGS: Cardiovascular: Satisfactory opacification of the pulmonary arteries to the segmental level. No evidence of pulmonary embolism. Normal heart size. No pericardial effusion. Coronary artery calcifications. Mediastinum/Nodes: Scattered mediastinal lymph nodes are not pathologically enlarged. Esophagus is decompressed. No abnormal mediastinal fluid or gas collections. Lungs/Pleura: There is consolidation in the left lower lung with adjacent airspace disease. This likely indicates pneumonia. No pneumothorax. Small left pleural effusion. Upper Abdomen: No acute abnormality. Musculoskeletal: Degenerative changes in the spine. No destructive bone lesions. Review of the MIP images confirms the above findings. IMPRESSION: 1. No evidence of significant pulmonary embolus. 2. Consolidation and airspace disease in the left lower lung with small left pleural effusion likely representing pneumonia. Electronically Signed   By: Lucienne Capers M.D.   On: 02/17/2017 01:29        Scheduled Meds: . apixaban  5 mg Oral BID  . azithromycin  250 mg Oral Daily  . diltiazem  240 mg Oral Daily  . donepezil  5 mg Oral Daily  . dorzolamide-timolol  1 drop Left Eye BID  . ezetimibe  10 mg Oral Daily  . fluticasone furoate-vilanterol  1 puff Inhalation Daily  . guaiFENesin  1,200 mg Oral BID  . ketorolac  1 drop Left Eye QID  . latanoprost  1 drop Both Eyes QHS  . memantine  10 mg Oral BID  . metoprolol succinate  25 mg Oral Daily  . montelukast  10 mg Oral QHS  . multivitamin with minerals  1 tablet Oral Daily  . niacin  1,000 mg Oral QHS  . pantoprazole  40 mg Oral Daily  . sertraline  100 mg Oral QHS   Continuous  Infusions: . piperacillin-tazobactam (ZOSYN)  IV Stopped (02/18/17 1023)     LOS: 1 day     Kahlen Morais, MD [facility] Pager [phone]  If 7PM-7AM, please contact night-coverage [email or web address] Password [email or web address] 02/18/2017, 12:52 PM

## 2017-02-18 NOTE — Progress Notes (Signed)
Pt refuses cpap tonight-

## 2017-02-18 NOTE — Progress Notes (Signed)
Physical Therapy Treatment Patient Details Name: Juan Johnson MRN: [id number] DOB: [birth year] Today's Date: 02/18/2017    History of Present Illness Pt adm with PNA after recent lumbar fusion (5/4).  PMH - HTN, arthritis, CVA, peripheral vision loss, anxiety, depression, copd, dementia    PT Comments    Pt progressing towards goals, however, continues to require min guard during ambulation for safety secondary to decreased stability and running into obstacles with RW. Feel current recommendations remain appropriate. Will continue to follow to maximize functional mobility independence.    Follow Up Recommendations  No PT follow up;Supervision - Intermittent     Equipment Recommendations  None recommended by PT    Recommendations for Other Services       Precautions / Restrictions Precautions Precautions: Back Precaution Booklet Issued: No Precaution Comments: Verbally reviewed with pt. Pt able to recall 2/3 precautions. Verbal cues for no bending precautions.  Required Braces or Orthoses: Spinal Brace Spinal Brace: Lumbar corset;Applied in standing position Restrictions Weight Bearing Restrictions: No    Mobility  Bed Mobility               General bed mobility comments: Sitting in chair upon entry.   Transfers Overall transfer level: Needs assistance Equipment used: Rolling walker (2 wheeled) Transfers: Sit to/from Stand Sit to Stand: Min guard         General transfer comment: Min guard for safety. Verbal cues for hand placement.   Ambulation/Gait Ambulation/Gait assistance: Min guard Ambulation Distance (Feet): 400 Feet Assistive device: Rolling walker (2 wheeled) Gait Pattern/deviations: Step-through pattern;Decreased stride length;Drifts right/left Gait velocity: Decreased Gait velocity interpretation: Below normal speed for age/gender General Gait Details: Min guard for safety. Slow, guarded gait. verbal cues for upright posture. One instance of  running into obstacle with RW. Verbal cues to look ahead for obstacles. Pt with one coughing fit during ambulation. Required standing break and min A to prevent LOB. Oxygen sats from 95%-98% on  RA during ambulation.    Stairs            Wheelchair Mobility    Modified Rankin (Stroke Patients Only)       Balance Overall balance assessment: Needs assistance Sitting-balance support: No upper extremity supported;Feet supported Sitting balance-Leahy Scale: Good     Standing balance support: Bilateral upper extremity supported;During functional activity Standing balance-Leahy Scale: Poor Standing balance comment: Reliant on RW for stability                             Cognition Arousal/Alertness: Awake/alert Behavior During Therapy: WFL for tasks assessed/performed Overall Cognitive Status: Within Functional Limits for tasks assessed                                        Exercises      General Comments General comments (skin integrity, edema, etc.): Pt's wife present throughout session. Education to pt to mobilize with nursing throughout the day. Notified RN to mobilize pt throughout the day.       Pertinent Vitals/Pain Pain Assessment: Faces Faces Pain Scale: Hurts a little bit Pain Location: back Pain Descriptors / Indicators: Sore Pain Intervention(s): Limited activity within patient's tolerance;Monitored during session;Repositioned    Home Living                      Prior Function  PT Goals (current goals can now be found in the care plan section) Acute Rehab PT Goals Patient Stated Goal: go home PT Goal Formulation: With patient Time For Goal Achievement: 02/24/17 Potential to Achieve Goals: Good Progress towards PT goals: Progressing toward goals    Frequency    Min 3X/week      PT Plan Current plan remains appropriate    Co-evaluation              AM-PAC PT "6 Clicks" Daily Activity   Outcome Measure  Difficulty turning over in bed (including adjusting bedclothes, sheets and blankets)?: A Little Difficulty moving from lying on back to sitting on the side of the bed? : A Little Difficulty sitting down on and standing up from a chair with arms (e.g., wheelchair, bedside commode, etc,.)?: A Little Help needed moving to and from a bed to chair (including a wheelchair)?: A Little Help needed walking in hospital room?: A Little Help needed climbing 3-5 steps with a railing? : A Little 6 Click Score: 18    End of Session Equipment Utilized During Treatment: Back brace;Gait belt Activity Tolerance: Patient tolerated treatment well Patient left: in chair;with call bell/phone within reach;with chair alarm set;with family/visitor present Nurse Communication: Mobility status PT Visit Diagnosis: Unsteadiness on feet (R26.81)     Time: 1093-2355 PT Time Calculation (min) (ACUTE ONLY): 15 min  Charges:  $Gait Training: 8-22 mins                    G Codes:       Nicky Pugh, PT, DPT  Acute Rehabilitation Services  Pager: [place] 02/18/2017, 12:17 PM

## 2017-02-19 DIAGNOSIS — A419 Sepsis, unspecified organism: Principal | ICD-10-CM

## 2017-02-19 DIAGNOSIS — F0390 Unspecified dementia without behavioral disturbance: Secondary | ICD-10-CM

## 2017-02-19 DIAGNOSIS — J449 Chronic obstructive pulmonary disease, unspecified: Secondary | ICD-10-CM

## 2017-02-19 DIAGNOSIS — G4733 Obstructive sleep apnea (adult) (pediatric): Secondary | ICD-10-CM

## 2017-02-19 DIAGNOSIS — I10 Essential (primary) hypertension: Secondary | ICD-10-CM

## 2017-02-19 DIAGNOSIS — I4891 Unspecified atrial fibrillation: Secondary | ICD-10-CM

## 2017-02-19 DIAGNOSIS — K219 Gastro-esophageal reflux disease without esophagitis: Secondary | ICD-10-CM

## 2017-02-19 DIAGNOSIS — J189 Pneumonia, unspecified organism: Secondary | ICD-10-CM

## 2017-02-19 LAB — BASIC METABOLIC PANEL
ANION GAP: 8 (ref 5–15)
BUN: 8 mg/dL (ref 6–20)
CALCIUM: 8.5 mg/dL — AB (ref 8.9–10.3)
CO2: 22 mmol/L (ref 22–32)
Chloride: 105 mmol/L (ref 101–111)
Creatinine, Ser: 0.9 mg/dL (ref 0.61–1.24)
GFR calc Af Amer: >60 mL/min (ref >60)
GFR calc non Af Amer: >60 mL/min (ref >60)
GLUCOSE: 113 mg/dL — AB (ref 65–99)
Potassium: 3.6 mmol/L (ref 3.5–5.1)
SODIUM: 135 mmol/L (ref 135–145)

## 2017-02-19 LAB — CULTURE, RESPIRATORY W GRAM STAIN: Culture: NORMAL

## 2017-02-19 LAB — CBC
HCT: 29.1 % — ABNORMAL LOW (ref 39.0–52.0)
HEMOGLOBIN: 9.2 g/dL — AB (ref 13.0–17.0)
MCH: 26.1 pg (ref 26.0–34.0)
MCHC: 31.6 g/dL (ref 30.0–36.0)
MCV: 82.7 fL (ref 78.0–100.0)
Platelets: 315 10*3/uL (ref 150–400)
RBC: 3.52 MIL/uL — ABNORMAL LOW (ref 4.22–5.81)
RDW: 14.2 % (ref 11.5–15.5)
WBC: 12.8 10*3/uL — ABNORMAL HIGH (ref 4.0–10.5)

## 2017-02-19 LAB — CULTURE, RESPIRATORY

## 2017-02-19 MED ORDER — DOXYCYCLINE HYCLATE 100 MG PO CAPS: 100.0000 mg | ORAL_CAPSULE | Freq: Two times a day (BID) | ORAL | 0 refills | Status: AC

## 2017-02-19 MED ORDER — GUAIFENESIN ER 600 MG PO TB12: 1200.0000 mg | ORAL_TABLET | Freq: Two times a day (BID) | ORAL | 0 refills | Status: AC

## 2017-02-19 NOTE — Consult Note (Addendum)
   [facility] CM Inpatient Consult   02/19/2017  Juan Johnson [birth year] [id number]   Patient screened for [employer] [facility] program. Martin Majestic to bedside and Mr. Dec had already discharged.   Marthenia Rolling, MSN-Ed, RN,BSN [facility] Liaison [phone]

## 2017-02-19 NOTE — Discharge Summary (Signed)
Physician Discharge Summary  Juan Johnson YME:[id number] DOB: 25-May-1951 DOA: 02/16/2017  PCP: Curly Rim, MD  Admit date: 02/16/2017 Discharge date: 02/19/2017  Admitted From: Home  Disposition:  Home   Recommendations for Outpatient Follow-up:  1. Follow up with PCP in 1 weeks 2. Follow up with neurosurgery in 1 week or as scheduled 3. Please obtain repeat chest x ray in 4-6 weeks to ensure resolution  Discharge Condition: STABLE  CODE STATUS: FULL   Brief/Interim Summary: HPI: Juan Johnson is a 66 y.o. male with medical history significant of COPD, GERD, depression, anxiety, stroke, OSA on CPAP, PAF on Eliquis, mitral valve regurgitation, dementia, anemia, who presents with cough, shortness breath and fever.  Pt had a decompressive laminectomy and fusion for treatment of spondylolisthesis at L5/S1 level, which was done by Dr. Nundkumar 2 days ago. The surgery went well, and he went home the next day. Pt states that the surgical site pain is mild. There is minimal amount of bright red blood drainage from surgical incision site on back. Pt states that he developed cough, fever and chills today. He has mild SOB. He coughs up "cloudy" sputum. No CP. No nausea, vomiting, diarrhea or AP. No unilaterally weakness. Family noticed that he is more confused than usual. His mental status improved after receiving 3 L IV fluid. Currently he is oriented 3. He has not been on his Eliquis for past week due to recent surgery, plan is to resume Eliquis tomorrow (5/7) per pt  ED Course: pt was found to have WBC 14.0, lactate of 0.91, negative urinalysis, electrolytes renal function okay, temperature 101.4, tachycardia, tachypnea, O2 sat 95% on room air, soft blood pressure. CT angiogram is negative for PE, but showed infiltration in LLL.  the patient is admitted to telemetry bed as inpatient.   Hospital Problem List and course:   1. Sepsis secondary to HCAP, LLL: Met sepsis criteria on admission.  Treated per protocol with IV fluids and started empirically on IV vancomycin, Zosyn and azithromycin.Blood cultures with no growth to date, sputum culture still pending Sputum culture, urine Legionella antigen still pending, otherwise negative HIV antibody, negative influenza panel, negative streptococcal antigen,, given negative blood cultures will DC IV vancomycin and continue with Zosyn and azithromycin,discharge home on oral doxycycline to complete course.  encouraged to use incentive spirometry, has been ordered for follow-up for him, started on Mucinex. Repeat CXR in 4-6 weeks outpatient to ensure resolution.   2. Transient confusion: Likely related to acute infectious etiology complicating underlying dementia. No focal deficits on clinical exam. Resolved, mentation back to baseline. 3. COPD: Stable without bronchospasm or active wheezing, resume home meds at dsicharge 4. OSA: Continue nightly CPAP. 5. Essential hypertension:  Continue metoprolol and Cardizem. Blood pressure started to increase, may need to resume Diovan-hydrochlorothiazide tomorrow. 6. Paroxysmal A. fib with RVR: Patient had RVR in the 100-120s in the ED likely due to acute infection. Continue metoprolol and Cardizem. Ventricular rate has since been controlled. CHA2DS2-VASc Scoreis 3. Eliquis had been held for surgery , and was resumed today has Dr. Algis Liming  discussion with neurosurgery . 7. Anemia: Stable. Follow CBCs. 8. Dementia without behavioral disturbance: Mental status back to baseline. Continue Namenda and Aricept.  Follow up with neurology.  9. GERD: Protonix 10. Anxiety & depression: Stable without suicidal or homicidal ideations or audiovisual hallucinations. Continue Zoloft. 11. Status post recent decompressive laminectomy and fusion L5-S1: Discussed with neurosurgery Dr. Kathyrn Sheriff , dressing can be removed and patient may shower.  Patient seen at bedside along with neurosurgeon. Stable without  complications.   DVT prophylaxis: SCDs. Eliquis started 02/18/17. Code Status: Full Family Communication: Discussed with wife at bedside Disposition: DC home.   Consultants:  Neurosurgery via phone  Procedures:  None  Antimicrobials:  IV vancomycin, Zosyn and azithromycin   Discharge Diagnoses:  Principal Problem:   HCAP (healthcare-associated pneumonia) Active Problems:   Essential hypertension   COPD GOLD I   Atrial fibrillation with rapid ventricular response (HCC)   Dementia without behavioral disturbance   Spondylolisthesis at L5-S1 level   GERD (gastroesophageal reflux disease)   OSA (obstructive sleep apnea)   Sepsis ([place])  Discharge Instructions  Discharge Instructions    Increase activity slowly    Complete by:  As directed      Allergies as of 02/19/2017      Reactions   Ace Inhibitors Cough   Crestor [rosuvastatin] Other (See Comments)   MYALGIAS CRAMPS MUSCLE WEAKNESS   Simvastatin Other (See Comments)   MYALGIAS CRAMPS MUSCLE WEAKNESS      Medication List    STOP taking these medications   ALPRAZolam 0.5 MG tablet Commonly known as:  XANAX     TAKE these medications   acetaminophen 500 MG tablet Commonly known as:  TYLENOL Take 1,000 mg by mouth every 4 (four) hours as needed for mild pain or moderate pain (Takes 2 every 4- 6 hours).   albuterol 108 (90 Base) MCG/ACT inhaler Commonly known as:  PROVENTIL HFA;VENTOLIN HFA Inhale 2 puffs into the lungs every 6 (six) hours as needed for wheezing or shortness of breath.   apixaban 5 MG Tabs tablet Commonly known as:  ELIQUIS Take 1 tablet (5 mg total) by mouth 2 (two) times daily.   BREO ELLIPTA 100-25 MCG/INH Aepb Generic drug:  fluticasone furoate-vilanterol Take 1 puff by mouth daily.   diltiazem 240 MG 24 hr capsule Commonly known as:  CARDIZEM CD Take 1 capsule (240 mg total) by mouth daily.   donepezil 5 MG tablet Commonly known as:  ARICEPT Take 1 tablet (5 mg total)  by mouth daily.   dorzolamide-timolol 22.3-6.8 MG/ML ophthalmic solution Commonly known as:  COSOPT Place 1 drop into the left eye 2 (two) times daily.   doxycycline 100 MG capsule Commonly known as:  VIBRAMYCIN Take 1 capsule (100 mg total) by mouth 2 (two) times daily. Start taking on:  02/20/2017   ezetimibe 10 MG tablet Commonly known as:  ZETIA Take 10 mg by mouth daily.   guaiFENesin 600 MG 12 hr tablet Commonly known as:  MUCINEX Take 2 tablets (1,200 mg total) by mouth 2 (two) times daily.   ketorolac 0.5 % ophthalmic solution Commonly known as:  ACULAR Place 1 drop into the left eye 4 (four) times daily.   memantine 10 MG tablet Commonly known as:  NAMENDA Start with one tab daily for 2 weeks and then increase to 1 tablet (10 mg total) by mouth 2 (two) times daily. What changed:  how much to take  how to take this  when to take this  additional instructions   metoprolol succinate 25 MG 24 hr tablet Commonly known as:  TOPROL XL Take 1 tablet (25 mg total) by mouth daily.   montelukast 10 MG tablet Commonly known as:  SINGULAIR Take 10 mg by mouth at bedtime.   multivitamin with minerals tablet Take 1 tablet by mouth daily.   niacin 1000 MG CR tablet Commonly known as:  NIASPAN Take 1,000 mg  by mouth at bedtime.   omeprazole 20 MG capsule Commonly known as:  PRILOSEC Take 20 mg by mouth 2 (two) times daily before a meal.   sertraline 100 MG tablet Commonly known as:  ZOLOFT Take 1 tablet (100 mg total) by mouth daily. What changed:  when to take this   timolol 0.5 % ophthalmic solution Commonly known as:  BETIMOL Place 1 drop into the left eye 2 (two) times daily.   valsartan-hydrochlorothiazide 160-12.5 MG tablet Commonly known as:  DIOVAN-HCT Take 1 tablet by mouth daily.   XALATAN 0.005 % ophthalmic solution Generic drug:  latanoprost Place 1 drop into both eyes at bedtime.      Follow-up Information    Corrington, Kip A, MD.  Schedule an appointment as soon as possible for a visit in 1 week(s).   Specialty:  Family Medicine Contact information: [place] [address] [place] [address] [phone]        Consuella Lose, MD. Schedule an appointment as soon as possible for a visit in 1 week(s).   Specialty:  Neurosurgery Contact information: [address] [place] [address] [place] [address] [phone]        Melvenia Beam, MD. Schedule an appointment as soon as possible for a visit in 2 week(s).   Specialty:  Neurology Contact information: [address] [place] [address] [phone]          Allergies  Allergen Reactions  . Ace Inhibitors Cough  . Crestor [Rosuvastatin] Other (See Comments)    MYALGIAS CRAMPS MUSCLE WEAKNESS  . Simvastatin Other (See Comments)    MYALGIAS CRAMPS MUSCLE WEAKNESS    Procedures/Studies: Dg Chest 2 View  Result Date: 02/16/2017 CLINICAL DATA:  Sepsis and fever.  Acute mental status change. EXAM: CHEST  2 VIEW COMPARISON:  March 25, 2016 FINDINGS: Suggested left retrocardiac opacity on the lateral view, similar since June 2017. The heart, hila, mediastinum, lungs, and pleura are otherwise normal. IMPRESSION: Left retrocardiac opacity on the lateral view, similar since 2017. Whether this represents chronic atelectasis or scarring or recurrent infiltrate is unclear. Electronically Signed   By: Dorise Bullion III M.D   On: 02/16/2017 23:51   Ct Angio Chest Pe W Or Wo Contrast  Result Date: 02/17/2017 CLINICAL DATA:  Back surgery on Friday. Now with shortness of breath and cough. EXAM: CT ANGIOGRAPHY CHEST WITH CONTRAST TECHNIQUE: Multidetector CT imaging of the chest was performed using the standard protocol during bolus administration of intravenous contrast. Multiplanar CT image reconstructions and MIPs were obtained to evaluate the vascular anatomy. CONTRAST:  100 mL Isovue 370 COMPARISON:  None. FINDINGS: Cardiovascular: Satisfactory  opacification of the pulmonary arteries to the segmental level. No evidence of pulmonary embolism. Normal heart size. No pericardial effusion. Coronary artery calcifications. Mediastinum/Nodes: Scattered mediastinal lymph nodes are not pathologically enlarged. Esophagus is decompressed. No abnormal mediastinal fluid or gas collections. Lungs/Pleura: There is consolidation in the left lower lung with adjacent airspace disease. This likely indicates pneumonia. No pneumothorax. Small left pleural effusion. Upper Abdomen: No acute abnormality. Musculoskeletal: Degenerative changes in the spine. No destructive bone lesions. Review of the MIP images confirms the above findings. IMPRESSION: 1. No evidence of significant pulmonary embolus. 2. Consolidation and airspace disease in the left lower lung with small left pleural effusion likely representing pneumonia. Electronically Signed   By: Lucienne Capers M.D.   On: 02/17/2017 01:29   Dg Lumbar Spine 1 View  Result Date: 02/14/2017 CLINICAL DATA:  Surgery. FLUOROSCOPY TIME:  33 seconds. Images: 2 EXAM: LUMBAR SPINE - 1 VIEW COMPARISON:  None. FINDINGS: By the end of the study, pedicle rods and screws are seen at 4 levels and 3 bone cages are identified. Image difficult to definitively identified the affected levels based on provided imaging. IMPRESSION: Surgical hardware placement as above. Electronically Signed   By: Dorise Bullion III M.D   On: 02/14/2017 20:35   Dg C-arm 1-60 Min  Result Date: 02/14/2017 CLINICAL DATA:  Lumbar spine fusion. FLUOROSCOPY TIME:  33 seconds Images: 2 EXAM: DG C-ARM 61-120 MIN COMPARISON:  None. FINDINGS: Posterior retractors are identified. A surgical instrument projects over L5-S1 during the study. Pedicle rods and screws are seen at 4 levels by the end of the study. It is difficult to definitively identify the affected levels on provided images. Hardware is in good position. Three bone cages are in good position. IMPRESSION:  Surgical hardware placement as above. Electronically Signed   By: Dorise Bullion III M.D   On: 02/14/2017 20:34    (Echo, Carotid, EGD, Colonoscopy, ERCP)   Subjective: Pt says that he is breathing much better  Discharge Exam: Vitals:   02/18/17 2129 02/19/17 0606  BP: 125/62 134/60  Pulse: (!) 125 80  Resp: 18 18  Temp: 99.4 F (37.4 C) 98.4 F (36.9 C)   Vitals:   02/18/17 1336 02/18/17 2129 02/19/17 0606 02/19/17 0812  BP: (!) 138/52 125/62 134/60   Pulse: 68 (!) 125 80   Resp: _0 Temp: 98.6 F (37 C) 99.4 F (37.4 C) 98.4 F (36.9 C)   TempSrc: Oral Oral Oral   SpO2: 98% 95% 96% 98%    General: Pt is alert, awake, not in acute distress Cardiovascular: RRR, S1/S2 +, no rubs, no gallops Respiratory: CTA bilaterally, no wheezing, no rhonchi Abdominal: Soft, NT, ND, bowel sounds + Extremities:  no cyanosis  The results of significant diagnostics from this hospitalization (including imaging, microbiology, ancillary and laboratory) are listed below for reference.     Microbiology: Recent Results (from the past 240 hour(s))  Culture, blood (Routine x 2)     Status: None (Preliminary result)   Collection Time: 02/16/17 10:30 PM  Result Value Ref Range Status   Specimen Description BLOOD LEFT ANTECUBITAL  Final   Special Requests   Final    BOTTLES DRAWN AEROBIC AND ANAEROBIC Blood Culture adequate volume   Culture NO GROWTH 3 DAYS  Final   Report Status PENDING  Incomplete  Culture, blood (Routine x 2)     Status: None (Preliminary result)   Collection Time: 02/16/17 10:45 PM  Result Value Ref Range Status   Specimen Description BLOOD RIGHT ANTECUBITAL  Final   Special Requests   Final    BOTTLES DRAWN AEROBIC AND ANAEROBIC Blood Culture adequate volume   Culture NO GROWTH 3 DAYS  Final   Report Status PENDING  Incomplete  Urine culture     Status: None   Collection Time: 02/17/17 12:28 AM  Result Value Ref Range Status   Specimen Description URINE,  RANDOM  Final   Special Requests NONE  Final   Culture NO GROWTH  Final   Report Status 02/18/2017 FINAL  Final  Culture, sputum-assessment     Status: None   Collection Time: 02/17/17 10:07 AM  Result Value Ref Range Status   Specimen Description EXPECTORATED SPUTUM  Final   Special Requests NONE  Final   Sputum evaluation THIS SPECIMEN IS ACCEPTABLE  FOR SPUTUM CULTURE  Final   Report Status 02/17/2017 FINAL  Final  Culture, respiratory (NON-Expectorated)     Status: None   Collection Time: 02/17/17 10:07 AM  Result Value Ref Range Status   Specimen Description EXPECTORATED SPUTUM  Final   Special Requests NONE Reflexed from [id number]  Final   Gram Stain   Final    ABUNDANT WBC PRESENT, PREDOMINANTLY PMN RARE SQUAMOUS EPITHELIAL CELLS PRESENT MODERATE GRAM POSITIVE COCCI IN PAIRS RARE GRAM POSITIVE RODS RARE BUDDING YEAST SEEN    Culture Consistent with normal respiratory flora.  Final   Report Status 02/19/2017 FINAL  Final    Labs: BNP (last 3 results) No results for input(s): BNP in the last 8760 hours. Basic Metabolic Panel:  Recent Labs Lab 02/16/17 2232 02/18/17 0555 02/19/17 0729  NA 134* 138 135  K 3.6 4.0 3.6  CL 103 106 105  CO2 _0 GLUCOSE 113* 123* 113*  BUN _1 CREATININE 1.10 0.82 0.90  CALCIUM 8.8* 8.4* 8.5*   Liver Function Tests:  Recent Labs Lab 02/16/17 2232  AST 24  ALT 15*  ALKPHOS 80  BILITOT 0.7  PROT 6.6  ALBUMIN 3.1*   No results for input(s): LIPASE, AMYLASE in the last 168 hours. No results for input(s): AMMONIA in the last 168 hours. CBC:  Recent Labs Lab 02/16/17 2232 02/18/17 0555 02/19/17 0729  WBC 14.0* 13.1* 12.8*  NEUTROABS 10.6*  --   --   HGB 10.0* 9.2* 9.2*  HCT 31.8* 28.5* 29.1*  MCV 83.7 84.3 82.7  PLT 278 268 315   Cardiac Enzymes: No results for input(s): CKTOTAL, CKMB, CKMBINDEX, TROPONINI in the last 168 hours. BNP: Invalid input(s): POCBNP CBG: No results for input(s): GLUCAP in the last  168 hours. D-Dimer No results for input(s): DDIMER in the last 72 hours. Hgb A1c No results for input(s): HGBA1C in the last 72 hours. Lipid Profile No results for input(s): CHOL, HDL, LDLCALC, TRIG, CHOLHDL, LDLDIRECT in the last 72 hours. Thyroid function studies No results for input(s): TSH, T4TOTAL, T3FREE, THYROIDAB in the last 72 hours.  Invalid input(s): FREET3 Anemia work up No results for input(s): VITAMINB12, FOLATE, FERRITIN, TIBC, IRON, RETICCTPCT in the last 72 hours. Urinalysis    Component Value Date/Time   COLORURINE YELLOW 02/17/2017 0028   APPEARANCEUR HAZY (A) 02/17/2017 0028   LABSPEC 1.025 02/17/2017 0028   PHURINE 5.0 02/17/2017 0028   GLUCOSEU NEGATIVE 02/17/2017 0028   HGBUR NEGATIVE 02/17/2017 0028   BILIRUBINUR NEGATIVE 02/17/2017 0028   KETONESUR 5 (A) 02/17/2017 0028   PROTEINUR 100 (A) 02/17/2017 0028   UROBILINOGEN 0.2 05/26/2010 0959   NITRITE NEGATIVE 02/17/2017 0028   LEUKOCYTESUR NEGATIVE 02/17/2017 0028   Sepsis Labs Invalid input(s): PROCALCITONIN,  WBC,  LACTICIDVEN Microbiology Recent Results (from the past 240 hour(s))  Culture, blood (Routine x 2)     Status: None (Preliminary result)   Collection Time: 02/16/17 10:30 PM  Result Value Ref Range Status   Specimen Description BLOOD LEFT ANTECUBITAL  Final   Special Requests   Final    BOTTLES DRAWN AEROBIC AND ANAEROBIC Blood Culture adequate volume   Culture NO GROWTH 3 DAYS  Final   Report Status PENDING  Incomplete  Culture, blood (Routine x 2)     Status: None (Preliminary result)   Collection Time: 02/16/17 10:45 PM  Result Value Ref Range Status   Specimen Description BLOOD RIGHT ANTECUBITAL  Final   Special Requests  Final    BOTTLES DRAWN AEROBIC AND ANAEROBIC Blood Culture adequate volume   Culture NO GROWTH 3 DAYS  Final   Report Status PENDING  Incomplete  Urine culture     Status: None   Collection Time: 02/17/17 12:28 AM  Result Value Ref Range Status   Specimen  Description URINE, RANDOM  Final   Special Requests NONE  Final   Culture NO GROWTH  Final   Report Status 02/18/2017 FINAL  Final  Culture, sputum-assessment     Status: None   Collection Time: 02/17/17 10:07 AM  Result Value Ref Range Status   Specimen Description EXPECTORATED SPUTUM  Final   Special Requests NONE  Final   Sputum evaluation THIS SPECIMEN IS ACCEPTABLE FOR SPUTUM CULTURE  Final   Report Status 02/17/2017 FINAL  Final  Culture, respiratory (NON-Expectorated)     Status: None   Collection Time: 02/17/17 10:07 AM  Result Value Ref Range Status   Specimen Description EXPECTORATED SPUTUM  Final   Special Requests NONE Reflexed from [id number]  Final   Gram Stain   Final    ABUNDANT WBC PRESENT, PREDOMINANTLY PMN RARE SQUAMOUS EPITHELIAL CELLS PRESENT MODERATE GRAM POSITIVE COCCI IN PAIRS RARE GRAM POSITIVE RODS RARE BUDDING YEAST SEEN    Culture Consistent with normal respiratory flora.  Final   Report Status 02/19/2017 FINAL  Final   Time coordinating discharge: 34 minutes  SIGNED:  Irwin Brakeman, MD  [facility] 02/19/2017, 11:08 AM Pager [phone]  If 7PM-7AM, please contact night-coverage [email or web address] Password [email or web address]

## 2017-02-19 NOTE — Progress Notes (Signed)
Discharge instructions and medications discussed with patient & family.  All questions answered.

## 2017-02-21 LAB — CULTURE, BLOOD (ROUTINE X 2)
CULTURE: NO GROWTH
Culture: NO GROWTH
SPECIAL REQUESTS: ADEQUATE
SPECIAL REQUESTS: ADEQUATE

## 2017-03-05 ENCOUNTER — Ambulatory Visit: Payer: Medicare Other | Attending: Neurosurgery | Admitting: Physical Therapy

## 2017-03-05 ENCOUNTER — Encounter: Payer: Self-pay | Admitting: Physical Therapy

## 2017-03-05 DIAGNOSIS — R5381 Other malaise: Secondary | ICD-10-CM | POA: Diagnosis not present

## 2017-03-05 DIAGNOSIS — M2560 Stiffness of unspecified joint, not elsewhere classified: Secondary | ICD-10-CM | POA: Insufficient documentation

## 2017-03-05 DIAGNOSIS — M5442 Lumbago with sciatica, left side: Secondary | ICD-10-CM | POA: Diagnosis not present

## 2017-03-05 DIAGNOSIS — R262 Difficulty in walking, not elsewhere classified: Secondary | ICD-10-CM | POA: Diagnosis not present

## 2017-03-05 DIAGNOSIS — M5416 Radiculopathy, lumbar region: Secondary | ICD-10-CM

## 2017-03-05 NOTE — Patient Instructions (Signed)
  Supine Marching: Do not lift knee past 90 degrees 20 times Twice a day    Pull leg up and toward opposite shoulder, gentle stretch until pull is felt in your buttock region 3-5 times on each Leg, hold 20-30 seconds Twice a day

## 2017-03-05 NOTE — Therapy (Signed)
[facility] [place], [place], [address] Phone: [phone]   Fax:  [phone]  Physical Therapy Evaluation  Patient Details  Name: Juan Johnson MRN: [id number] Date of Birth: Oct 30, 1950 Referring Provider: Consuella Lose, MD  Encounter Date: 03/05/2017      PT End of Session - 03/05/17 1512    Visit Number 1   Number of Visits 16   Date for PT Re-Evaluation 2017-06-03   Authorization Type G-codes every 10th visit, KX modifier after 15 visit   PT Start Time 1030   PT Stop Time 1120   PT Time Calculation (min) 50 min   Equipment Utilized During Treatment Back brace   Activity Tolerance Patient tolerated treatment well   Behavior During Therapy [facility] for tasks assessed/performed      Past Medical History:  Diagnosis Date  . Anemia   . Anxiety    claustrophobia  . Arthritis   . Complication of anesthesia    difficult to wake up after neck surgery  . COPD (chronic obstructive pulmonary disease) ([place])   . Dementia   . Depression   . Enlarged liver    20-25 years ago  . GERD (gastroesophageal reflux disease)   . Glaucoma   . Hemoptysis    a. 03/2016 in setting of PNA-->resolved.  . Hypertensive heart disease   . Mitral regurgitation    a. 09/2015 Echo: EF 55-60%, no rwma, mild to mod MR, PASP 27mmHg.  . Paroxysmal atrial flutter ([place])    a. Noted 09/2015 following back surgery-->Eliquis (CHA2DS2VASc = 4);  b. 03/2016 Recurrent PAFlutter in setting of PNA-->converted spont.  . 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 Pneumonia 04/2015   "bronchial" pneumonia   . Retinal tear of left eye   . Sleep apnea    does use cpap  . Stroke [facility]) ? 05/2014   peripheral vision loss    Past Surgical History:  Procedure Laterality Date  . BACK SURGERY  09/27/2015   [place]  . CARDIAC CATHETERIZATION  2003  . COLONOSCOPY    . EYE SURGERY  10/2007  . EYE SURGERY Bilateral 2018   Eyelid  . LEG SURGERY      AT AGE 61  . LUMBAR FUSION  09/27/2015  . NECK SURGERY  06/29/2010  . SHOULDER SURGERY  09/24/2011  . TONSILLECTOMY     AT AGE 26  . VASECTOMY      There were no vitals filed for this visit.       Subjective Assessment - 03/05/17 1044    Subjective Patient arriving to therapy today s/p lumbar fusion on 02/14/17. Pt reporting pain of 3-6/10 down his left LE. Pt reporting episodes of knee buckling. Pt also reporting cramping feeling in his left leg.    Pertinent History a-fib, COPD, h/o previous back surgery   Limitations House hold activities;Writing;Walking;Standing;Lifting   How long can you sit comfortably? 15 minutes   How long can you stand comfortably? 30 minutes, with support   Diagnostic tests X-ray, MRI prior to surgery   Patient Stated Goals Walk with pain, Walk without cane, Be able to lift 50# without difficulty   Currently in Pain? Yes   Pain Score 3    Pain Location Back   Pain Orientation Left   Pain Descriptors / Indicators Pins and needles;Burning   Pain Type Surgical pain   Pain Radiating Towards left LE (from buttock to ankle)   Pain Onset  1 to 4 weeks ago   Pain Relieving Factors lying down, tylenol   Effect of Pain on Daily Activities difficulty with ADL's, and household chores            [facility] PT Assessment - 03/05/17 0001      Assessment   Medical Diagnosis Lumbar fusion    Referring Provider Consuella Lose, MD   Onset Date/Surgical Date 02/14/17   Hand Dominance Right   Next MD Visit 8 weeks from surgery     Precautions   Precaution Comments No lifting more than 10 pounds   Required Braces or Orthoses Spinal Brace     Balance Screen   Has the patient fallen in the past 6 months Yes   How many times? 4  no falls since surgery     Observation/Other Assessments   Focus on Therapeutic Outcomes (FOTO)  55% limitation     ROM / Strength   AROM / PROM / Strength AROM;Strength     AROM   AROM Assessment Site Lumbar   Lumbar Flexion  70   Lumbar Extension 5     Strength   Strength Assessment Site Hip   Right/Left Hip Right;Left   Right Hip Flexion 4-/5   Right Hip Extension 3/5   Right Hip ABduction 4-/5   Right Hip ADduction 4-/5   Left Hip Flexion 4-/5   Left Hip Extension 3/5   Left Hip ABduction 4-/5   Left Hip ADduction 4-/5     Transfers   Transfers Sit to Stand     Ambulation/Gait   Ambulation/Gait Yes   Ambulation/Gait Assistance 6: Modified independent (Device/Increase time)  increased time   Ambulation Distance (Feet) 30 Feet   Assistive device None   Gait Pattern Step-through pattern;Decreased step length - right;Decreased step length - left;Decreased stride length;Decreased weight shift to left;Left flexed knee in stance;Lateral trunk lean to right;Wide base of support;Poor foot clearance - left;Poor foot clearance - right   Ambulation Surface Level;Indoor                   [facility] Adult PT Treatment/Exercise - 03/05/17 0001      Exercises   Exercises Lumbar     Lumbar Exercises: Stretches   Piriformis Stretch 3 reps;30 seconds   Piriformis Stretch Limitations limited hip flexoin to 90 degrees     Lumbar Exercises: Supine   Other Supine Lumbar Exercises supine marching x 10 each LE     Modalities   Modalities Cryotherapy     Cryotherapy   Number Minutes Cryotherapy 10 Minutes   Cryotherapy Location Lumbar Spine   Type of Cryotherapy Ice pack                PT Education - 03/05/17 1512    Education provided Yes   Education Details HEP   Person(s) Educated Patient   Methods Explanation;Demonstration;Handout;Verbal cues   Comprehension Verbalized understanding;Returned demonstration             PT Long Term Goals - 03/05/17 1521      PT LONG TERM GOAL #2   Title Patient will be able to stand 15 minutes with pain < 3/10.   Time 8   Period Weeks   Status New     PT LONG TERM GOAL #3   Title Perform ADL's with pain less than 3/10.    Time 8   Period  Weeks   Status Achieved     PT LONG TERM GOAL #4  Title Pt will be able to amb 500 feet with step through gait pattern with no device on community surface with pain < 3/10.      PT LONG TERM GOAL #5   Title Pt will improve bilateral LE hip flexion and abduction to 5/5 in order to improve gait and functional mobility.    Time 8   Period Weeks   Status New               Plan - 03-28-17 1513    Clinical Impression Statement Patient arriving to therapy as a low complexity evalaution following lumbar fusion. Hardware removed and replaced from L3-sacrum. Pt arriving in a LSO for support. Pt with lifting restrictions of 10# or less. Pt amb with antalgic gait with lateral lean to the right. Pt reporting pain levels that vary depending on the position and the day from 3-6/10. Pt reporting pain is worse with walking. Pt with LE weakness bilaterally in hip flexion and abduction. Pt with difficulty with amb and increased pain with ADL's and gait. Skilled PT needed to progress pt toward improved functional mobility.    Rehab Potential Good   Clinical Impairments Affecting Rehab Potential COPD, h/o previous lumbar fusion   PT Frequency 2x / week   PT Duration 8 weeks   PT Treatment/Interventions ADLs/Self Care Home Management;Electrical Stimulation;Cryotherapy;Moist Heat;Ultrasound;Therapeutic exercise;Therapeutic activities;Functional mobility training;Gait training;Stair training;Patient/family education;Taping;Passive range of motion;Manual techniques   PT Next Visit Plan lumbar strengtheing, core strengthening, STW to lumbar paraspinals and pirformis/gluteals   PT Home Exercise Plan SLR, pirformis stretch   Consulted and Agree with Plan of Care Patient      Patient will benefit from skilled therapeutic intervention in order to improve the following deficits and impairments:  Abnormal gait, Postural dysfunction, Decreased range of motion, Difficulty walking, Decreased mobility, Pain,  Decreased activity tolerance, Decreased strength  Visit Diagnosis: Acute bilateral low back pain with left-sided sciatica  Chronic lumbar radiculopathy  Difficulty in walking  Joint stiffness of spine  Debility      G-Codes - 03/28/2017 1042    Functional Assessment Tool Used (Outpatient Only) FOTO, clinical assessment   Functional Limitation Mobility: Walking and moving around   Mobility: Walking and Moving Around Current Status [phone]) At least 40 percent but less than 60 percent impaired, limited or restricted   Mobility: Walking and Moving Around Goal Status [phone]) At least 40 percent but less than 60 percent impaired, limited or restricted       Problem List Patient Active Problem List   Diagnosis Date Noted  . GERD (gastroesophageal reflux disease) 02/17/2017  . OSA (obstructive sleep apnea) 02/17/2017  . HCAP (healthcare-associated pneumonia) 02/17/2017  . Sepsis ([place]) 02/17/2017  . Spondylolisthesis at L5-S1 level 02/14/2017  . Hypertensive heart disease   . Paroxysmal atrial flutter ([place])   . Dementia without behavioral disturbance 06/04/2016  . Dementia with behavioral disturbance 04/18/2016  . Atrial flutter ([place]) 01/11/2016  . Mitral valve regurgitation: mild to moderate 10/23/2015  . New onset atrial flutter ([place]) 10/04/2015  . Atrial fibrillation with rapid ventricular response ([place])   . Anemia 10/03/2015  . Stroke ([place])   . Spondylolisthesis of lumbar region 09/27/2015  . Lobar pneumonia due to unspecified organism 05/25/2015  . Snoring 03/16/2015  . Witnessed apneic spells 03/16/2015  . Obese 03/16/2015  . Morning headache 03/16/2015  . Excessive daytime sleepiness 03/16/2015  . COPD GOLD I 09/25/2014  . Upper airway cough syndrome vs cough variant asthma 08/22/2014  . Essential  hypertension 08/22/2014  . Diplopia 06/25/2014  . Vision loss, bilateral 06/25/2014  . Weakness 06/25/2014  . Unspecified hereditary and idiopathic peripheral neuropathy  06/25/2014    Oretha Caprice, MPT 03/05/2017, 3:33 PM  [facility] [place], [place], [address] Phone: [phone]   Fax:  [phone]  Name: Juan Johnson MRN: [id number] Date of Birth: 1950/11/15

## 2017-03-11 ENCOUNTER — Encounter: Payer: Medicare Other | Admitting: Physical Therapy

## 2017-03-11 DIAGNOSIS — I10 Essential (primary) hypertension: Secondary | ICD-10-CM | POA: Diagnosis not present

## 2017-03-11 DIAGNOSIS — J449 Chronic obstructive pulmonary disease, unspecified: Secondary | ICD-10-CM | POA: Diagnosis not present

## 2017-03-11 DIAGNOSIS — J441 Chronic obstructive pulmonary disease with (acute) exacerbation: Secondary | ICD-10-CM | POA: Diagnosis not present

## 2017-03-11 DIAGNOSIS — Z9989 Dependence on other enabling machines and devices: Secondary | ICD-10-CM | POA: Diagnosis not present

## 2017-03-11 DIAGNOSIS — R918 Other nonspecific abnormal finding of lung field: Secondary | ICD-10-CM | POA: Diagnosis not present

## 2017-03-11 DIAGNOSIS — G4733 Obstructive sleep apnea (adult) (pediatric): Secondary | ICD-10-CM | POA: Diagnosis not present

## 2017-03-13 ENCOUNTER — Encounter: Payer: Medicare Other | Admitting: *Deleted

## 2017-03-17 NOTE — Addendum Note (Signed)
Addendum  created 03/17/17 1418 by Myrtie Soman, MD   Sign clinical note

## 2017-03-24 DIAGNOSIS — H401132 Primary open-angle glaucoma, bilateral, moderate stage: Secondary | ICD-10-CM | POA: Diagnosis not present

## 2017-03-25 ENCOUNTER — Ambulatory Visit: Payer: Medicare Other | Attending: Neurosurgery | Admitting: Physical Therapy

## 2017-03-25 ENCOUNTER — Encounter: Payer: Self-pay | Admitting: Physical Therapy

## 2017-03-25 DIAGNOSIS — M5416 Radiculopathy, lumbar region: Secondary | ICD-10-CM | POA: Diagnosis not present

## 2017-03-25 DIAGNOSIS — M2560 Stiffness of unspecified joint, not elsewhere classified: Secondary | ICD-10-CM | POA: Diagnosis not present

## 2017-03-25 DIAGNOSIS — R262 Difficulty in walking, not elsewhere classified: Secondary | ICD-10-CM | POA: Diagnosis not present

## 2017-03-25 DIAGNOSIS — M5442 Lumbago with sciatica, left side: Secondary | ICD-10-CM

## 2017-03-25 DIAGNOSIS — R5381 Other malaise: Secondary | ICD-10-CM | POA: Insufficient documentation

## 2017-03-25 NOTE — Therapy (Signed)
[facility] [address] [place], [place], [address] Phone: [phone]   Fax:  [phone]  Physical Therapy Treatment  Patient Details  Name: Juan Johnson MRN: [id number] Date of Birth: 08/04/1951 Referring Provider: nundkumar, neelessh, MD  Encounter Date: 03/25/2017      PT End of Session - 03/25/17 1002    Visit Number 2   Number of Visits 16   Date for PT Re-Evaluation 05/05/17   Authorization Type G-codes every 10th visit, KX modifier after 15 visit   PT Start Time 0945   PT Stop Time 1030   PT Time Calculation (min) 45 min   Equipment Utilized During Treatment Back brace   Activity Tolerance Patient tolerated treatment well   Behavior During Therapy WFL for tasks assessed/performed      Past Medical History:  Diagnosis Date  . Anemia   . Anxiety    claustrophobia  . Arthritis   . Complication of anesthesia    difficult to wake up after neck surgery  . COPD (chronic obstructive pulmonary disease) ([facility])   . Dementia   . Depression   . Enlarged liver    20-25 years ago  . GERD (gastroesophageal reflux disease)   . Glaucoma   . Hemoptysis    a. 03/2016 in setting of PNA-->resolved.  . Hypertensive heart disease   . Mitral regurgitation    a. 09/2015 Echo: EF 55-60%, no rwma, mild to mod MR, PASP 43mmHg.  . Paroxysmal atrial flutter ([facility])    a. Noted 09/2015 following back surgery-->Eliquis (CHA2DS2VASc = 4);  b. 03/2016 Recurrent PAFlutter in setting of PNA-->converted spont.  . 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 Pneumonia 04/2015   "bronchial" pneumonia   . Retinal tear of left eye   . Sleep apnea    does use cpap  . Stroke ([facility]) ? 05/2014   peripheral vision loss    Past Surgical History:  Procedure Laterality Date  . BACK SURGERY  09/27/2015   [place]  . CARDIAC CATHETERIZATION  2003  . COLONOSCOPY    . EYE SURGERY  10/2007  . EYE SURGERY Bilateral 2018   Eyelid  . LEG SURGERY      AT AGE 14  . LUMBAR FUSION  09/27/2015  . NECK SURGERY  06/29/2010  . SHOULDER SURGERY  09/24/2011  . TONSILLECTOMY     AT AGE 28  . VASECTOMY      There were no vitals filed for this visit.      Subjective Assessment - 03/25/17 0952    Subjective Patient arriving to therapy reporting having pneumonia and not being able to make it to therapy since his evaluation on 03/05/17. Pt reporting back pain of 2/10.    Pertinent History a-fib, COPD, h/o previous back surgery   Limitations House hold activities;Writing;Walking;Standing;Lifting   How long can you sit comfortably? 15 minutes   How long can you stand comfortably? 30 minutes, with support   Diagnostic tests X-ray, MRI prior to surgery   Patient Stated Goals Walk with pain, Walk without cane, Be able to lift 50# without difficulty   Currently in Pain? Yes   Pain Score 2    Pain Orientation Left   Pain Descriptors / Indicators Aching;Tingling   Pain Type Surgical pain   Pain Radiating Towards left LE   Pain Onset 1 to 4 weeks ago   Pain Relieving Factors lying down tylenol   Effect of Pain on   Daily Activities difficulty with farm chores and ADL's            [facility] PT Assessment - 03/25/17 0001      Assessment   Medical Diagnosis Lumbar fusion    Referring Provider nundkumar, neelessh, MD   Onset Date/Surgical Date 02/14/17   Hand Dominance Right   Next MD Visit 8 weeks from surgery     Precautions   Precaution Comments No lifting more than 10 pounds   Required Braces or Orthoses Spinal Brace  Pt arriving to therapy wearing no brace today                     [facility] Adult PT Treatment/Exercise - 03/25/17 0001      Exercises   Exercises Lumbar     Lumbar Exercises: Aerobic   Stationary Bike Nustep: L4 x 15 minutes     Lumbar Exercises: Supine   Ab Set 10 reps   Clam 10 reps;5 seconds   Bridge 10 reps;3 seconds   Straight Leg Raise 10 reps;2 seconds   Other Supine Lumbar Exercises supine  marching x 10 each LE     Modalities   Modalities Cryotherapy     Cryotherapy   Number Minutes Cryotherapy 10 Minutes   Cryotherapy Location Lumbar Spine   Type of Cryotherapy Ice pack     Manual Therapy   Manual Therapy Soft tissue mobilization   Manual therapy comments pt with pain in bilateral lumbar paraspinals and mild brusing noted over inferior incision. Incision closed   Soft tissue mobilization 10 minutes                PT Education - 03/25/17 1002    Education provided Yes   Education Details HEP   Person(s) Educated Patient   Methods Explanation;Demonstration;Handout;Verbal cues   Comprehension Verbalized understanding;Returned demonstration;Verbal cues required          PT Short Term Goals - 03/25/17 1004      PT SHORT TERM GOAL #1   Title Ind with an initial HEP.   Time 4   Period Weeks   Status New           PT Long Term Goals - 03/25/17 1005      PT LONG TERM GOAL #1   Title Patient sit 30 minutes with pain not > 3/10.   Time 8   Period Weeks   Status Achieved     PT LONG TERM GOAL #2   Title Patient will be able to stand 15 minutes with pain < 3/10.   Time 8   Period Weeks   Status New     PT LONG TERM GOAL #3   Title Perform ADL's with pain less than 3/10.    Time 8   Period Weeks   Status Achieved     PT LONG TERM GOAL #4   Title Pt will be able to amb 500 feet with step through gait pattern with no device on community surface with pain < 3/10.    Time 8   Period Weeks   Status Not Met     PT LONG TERM GOAL #5   Title Pt will improve bilateral LE hip flexion and abduction to 5/5 in order to improve gait and functional mobility.    Time 8   Period Weeks   Status New               Plan - 03/25/17 1217    Clinical   Impression Statement Patient arriving to therapy today reporting 2/10 pain in his low back extending down his left LE. Pt tolerating ther exercises well today reporting no increase in pain with supine  lumbar exercises. STW performed at end of session and ice applied. Pt leaving reporting no pain at end of session. Continue with skilled PT to progress toward LTG's set.    History and Personal Factors relevant to plan of care: lumbar fusion   Rehab Potential Good   Clinical Impairments Affecting Rehab Potential COPD, h/o previous lumbar fusion   PT Frequency 2x / week   PT Duration 8 weeks   PT Treatment/Interventions ADLs/Self Care Home Management;Electrical Stimulation;Cryotherapy;Moist Heat;Ultrasound;Therapeutic exercise;Therapeutic activities;Functional mobility training;Gait training;Stair training;Patient/family education;Taping;Passive range of motion;Manual techniques   PT Next Visit Plan lumbar strengtheing, core strengthening, STW to lumbar paraspinals and pirformis/gluteals   PT Home Exercise Plan SLR, pirformis stretch   Consulted and Agree with Plan of Care Patient      Patient will benefit from skilled therapeutic intervention in order to improve the following deficits and impairments:  Abnormal gait, Postural dysfunction, Decreased range of motion, Difficulty walking, Decreased mobility, Pain, Decreased activity tolerance, Decreased strength  Visit Diagnosis: Acute bilateral low back pain with left-sided sciatica  Difficulty in walking  Chronic lumbar radiculopathy  Joint stiffness of spine  Debility     Problem List Patient Active Problem List   Diagnosis Date Noted  . GERD (gastroesophageal reflux disease) 02/17/2017  . OSA (obstructive sleep apnea) 02/17/2017  . HCAP (healthcare-associated pneumonia) 02/17/2017  . Sepsis ([facility]) 02/17/2017  . Spondylolisthesis at L5-S1 level 02/14/2017  . Hypertensive heart disease   . Paroxysmal atrial flutter ([facility])   . Dementia without behavioral disturbance 06/04/2016  . Dementia with behavioral disturbance 04/18/2016  . Atrial flutter ([facility]) 01/11/2016  . Mitral valve regurgitation: mild to moderate 10/23/2015  . New  onset atrial flutter ([facility]) 10/04/2015  . Atrial fibrillation with rapid ventricular response ([facility])   . Anemia 10/03/2015  . Stroke ([facility])   . Spondylolisthesis of lumbar region 09/27/2015  . Lobar pneumonia due to unspecified organism 05/25/2015  . Snoring 03/16/2015  . Witnessed apneic spells 03/16/2015  . Obese 03/16/2015  . Morning headache 03/16/2015  . Excessive daytime sleepiness 03/16/2015  . COPD GOLD I 09/25/2014  . Upper airway cough syndrome vs cough variant asthma 08/22/2014  . Essential hypertension 08/22/2014  . Diplopia 06/25/2014  . Vision loss, bilateral 06/25/2014  . Weakness 06/25/2014  . Unspecified hereditary and idiopathic peripheral neuropathy 06/25/2014     R , MPT 03/25/2017, 12:20 PM  [facility] [address] [place], [place], [address] Phone: [phone]   Fax:  [phone]  Name: Juan Johnson MRN: [id number] Date of Birth: 01/16/1951   

## 2017-03-25 NOTE — Patient Instructions (Signed)
  Lift leg to the height of the opposite knee Hold 1-2 seconds Repeat 10 times Twice a day

## 2017-03-28 DIAGNOSIS — G4733 Obstructive sleep apnea (adult) (pediatric): Secondary | ICD-10-CM | POA: Diagnosis not present

## 2017-03-31 DIAGNOSIS — R05 Cough: Secondary | ICD-10-CM | POA: Diagnosis not present

## 2017-03-31 DIAGNOSIS — J449 Chronic obstructive pulmonary disease, unspecified: Secondary | ICD-10-CM | POA: Diagnosis not present

## 2017-03-31 DIAGNOSIS — G4733 Obstructive sleep apnea (adult) (pediatric): Secondary | ICD-10-CM | POA: Diagnosis not present

## 2017-03-31 DIAGNOSIS — I10 Essential (primary) hypertension: Secondary | ICD-10-CM | POA: Diagnosis not present

## 2017-03-31 DIAGNOSIS — R0602 Shortness of breath: Secondary | ICD-10-CM | POA: Diagnosis not present

## 2017-04-01 ENCOUNTER — Ambulatory Visit: Payer: Medicare Other | Admitting: Physical Therapy

## 2017-04-01 ENCOUNTER — Encounter: Payer: Self-pay | Admitting: Physical Therapy

## 2017-04-01 DIAGNOSIS — M5416 Radiculopathy, lumbar region: Secondary | ICD-10-CM | POA: Diagnosis not present

## 2017-04-01 DIAGNOSIS — R262 Difficulty in walking, not elsewhere classified: Secondary | ICD-10-CM | POA: Diagnosis not present

## 2017-04-01 DIAGNOSIS — M5442 Lumbago with sciatica, left side: Secondary | ICD-10-CM | POA: Diagnosis not present

## 2017-04-01 DIAGNOSIS — R5381 Other malaise: Secondary | ICD-10-CM

## 2017-04-01 DIAGNOSIS — M2560 Stiffness of unspecified joint, not elsewhere classified: Secondary | ICD-10-CM

## 2017-04-01 NOTE — Therapy (Signed)
[facility] [place], [place], [address] Phone: [phone]   Fax:  [phone]  Physical Therapy Treatment  Patient Details  Name: Juan Johnson MRN: [id number] Date of Birth: 08-21-1951 Referring Provider: Luciano Cutter, MD  Encounter Date: 04/01/2017      PT End of Session - 04/01/17 0954    Visit Number 3   Number of Visits 16   Date for PT Re-Evaluation 06/03/17   Authorization Type G-codes every 10th visit, KX modifier after 15 visit   PT Start Time 0947   PT Stop Time 1035   PT Time Calculation (min) 48 min   Equipment Utilized During Treatment --   Activity Tolerance Patient tolerated treatment well   Behavior During Therapy [facility] for tasks assessed/performed      Past Medical History:  Diagnosis Date  . Anemia   . Anxiety    claustrophobia  . Arthritis   . Complication of anesthesia    difficult to wake up after neck surgery  . COPD (chronic obstructive pulmonary disease) ([place])   . Dementia   . Depression   . Enlarged liver    20-25 years ago  . GERD (gastroesophageal reflux disease)   . Glaucoma   . Hemoptysis    a. 03/2016 in setting of PNA-->resolved.  . Hypertensive heart disease   . Mitral regurgitation    a. 09/2015 Echo: EF 55-60%, no rwma, mild to mod MR, PASP 73mHg.  . [place]ox[place] fl[place]    a. Noted 09/2015 following back surgery-->Eliquis (CHA2DS2VASc = 4);  b. 03/2016 Recurrent PAFlutter in setting of PNA-->converted spont.  . 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 Pneumonia 04/2015   "bronchial" pneumonia   . Retinal tear of left eye   . Sleep apnea    does use cpap  . Stroke ([facility] ? 05/2014   peripheral vision loss    Past Surgical History:  Procedure Laterality Date  . BACK SURGERY  09/27/2015   [place]  . CARDIAC CATHETERIZATION  2003  . COLONOSCOPY    . EYE SURGERY  10/2007  . EYE SURGERY Bilateral 2018   Eyelid  . LEG SURGERY     AT  AGE 66 . LUMBAR FUSION  09/27/2015  . NECK SURGERY  06/29/2010  . SHOULDER SURGERY  09/24/2011  . TONSILLECTOMY     AT AGE 89 . VASECTOMY      There were no vitals filed for this visit.      Subjective Assessment - 04/01/17 0949    Subjective Reports that he slipped this morning while just walking with walking stick due to LLE giving out.   Pertinent History a-fib, COPD, h/o previous back surgery   Limitations House hold activities;Writing;Walking;Standing;Lifting   How long can you sit comfortably? 15 minutes   How long can you stand comfortably? 30 minutes, with support   Diagnostic tests X-ray, MRI prior to surgery   Patient Stated Goals Walk with pain, Walk without cane, Be able to lift 50# without difficulty   Currently in Pain? Yes   Pain Score --  "a little" no numerical rating provided   Pain Location Hip   Pain Orientation Left;Posterior   Pain Descriptors / Indicators Discomfort   Pain Type Surgical pain   Pain Onset 1 to 4 weeks ago            [facility] - 04/01/17 0001  Assessment   Medical Diagnosis Lumbar fusion    Onset Date/Surgical Date 02/14/17   Hand Dominance Right   Next MD Visit 04/13/2017     Precautions   Precaution Comments No lifting more than 10 pounds   Required Braces or Orthoses Spinal Brace  No lumbar brace donned today                     [facility] Adult PT Treatment/Exercise - 04/01/17 0001      Lumbar Exercises: Aerobic   Stationary Bike Nustep: L5 x 17 minutes     Lumbar Exercises: Standing   Row Strengthening;Both;20 reps   Row Limitations Pink XTS   Other Standing Lumbar Exercises Shoulder pressdown isometric with core x15 reps with 3 sec hold     Lumbar Exercises: Supine   Ab Set 20 reps;3 seconds   Bridge 20 reps;3 seconds   Straight Leg Raise 15 reps     Modalities   Modalities Electrical Stimulation;Cryotherapy     Cryotherapy   Number Minutes Cryotherapy 10 Minutes   Cryotherapy  Location Lumbar Spine   Type of Cryotherapy Ice pack     Electrical Stimulation   Electrical Stimulation Location B lumbar paraspinals   Electrical Stimulation Action IFC   Electrical Stimulation Parameters 1-10 hz x10 min   Electrical Stimulation Goals Pain                PT Education - 04/01/17 1030    Education provided Yes   Education Details HEP- shoulder press down, ab set, bridge, SLR; with written instructions to use ice 10-15 minutes at a time.   Person(s) Educated Patient   Methods Explanation;Demonstration;Verbal cues;Handout   Comprehension Verbalized understanding;Returned demonstration;Verbal cues required          PT Short Term Goals - 03/25/17 1004      PT SHORT TERM GOAL #1   Title Ind with an initial HEP.   Time 4   Period Weeks   Status New           PT Long Term Goals - 03/25/17 1005      PT LONG TERM GOAL #1   Title Patient sit 30 minutes with pain not > 3/10.   Time 8   Period Weeks   Status Achieved     PT LONG TERM GOAL #2   Title Patient will be able to stand 15 minutes with pain < 3/10.   Time 8   Period Weeks   Status New     PT LONG TERM GOAL #3   Title Perform ADL's with pain less than 3/10.    Time 8   Period Weeks   Status Achieved     PT LONG TERM GOAL #4   Title Pt will be able to amb 500 feet with step through gait pattern with no device on community surface with pain < 3/10.    Time 8   Period Weeks   Status Not Met     PT LONG TERM GOAL #5   Title Pt will improve bilateral LE hip flexion and abduction to 5/5 in order to improve gait and functional mobility.    Time 8   Period Weeks   Status New               Plan - 04/01/17 1038    Clinical Impression Statement Patient arrived to clinic with only "a little" L low back/hip pain with reports of continued LLE buckling. Patient guided  through both standing and supine exercises with emphasis on core activation. Patient provided new HEP for supine and  standing core/lumbar strengthening exercises with patient education regarding technique and parameters with written instrucitons for ice use for 10-15 minutes at a time. Modaliites completed in prone with patient reporting low back/hip pain upon standing from prone. Normal modalities response noted following removal of the modalities..   Rehab Potential Good   Clinical Impairments Affecting Rehab Potential COPD, h/o previous lumbar fusion   PT Frequency 2x / week   PT Duration 8 weeks   PT Treatment/Interventions ADLs/Self Care Home Management;Electrical Stimulation;Cryotherapy;Moist Heat;Ultrasound;Therapeutic exercise;Therapeutic activities;Functional mobility training;Gait training;Stair training;Patient/family education;Taping;Passive range of motion;Manual techniques   PT Next Visit Plan lumbar strengtheing, core strengthening, STW to lumbar paraspinals and pirformis/gluteals   PT Home Exercise Plan SLR, pirformis stretch   Consulted and Agree with Plan of Care Patient      Patient will benefit from skilled therapeutic intervention in order to improve the following deficits and impairments:  Abnormal gait, Postural dysfunction, Decreased range of motion, Difficulty walking, Decreased mobility, Pain, Decreased activity tolerance, Decreased strength  Visit Diagnosis: Acute bilateral low back pain with left-sided sciatica  Difficulty in walking  Chronic lumbar radiculopathy  Joint stiffness of spine  Debility     Problem List Patient Active Problem List   Diagnosis Date Noted  . GERD (gastroesophageal reflux disease) 02/17/2017  . OSA (obstructive sleep apnea) 02/17/2017  . HCAP (healthcare-associated pneumonia) 02/17/2017  . Sepsis ([place]) 02/17/2017  . Spondylolisthesis at L5-S1 level 02/14/2017  . Hypertensive heart disease   . Paroxysmal atrial flutter ([place])   . Dementia without behavioral disturbance 06/04/2016  . Dementia with behavioral disturbance 04/18/2016  . Atrial  flutter ([place]) 01/11/2016  . Mitral valve regurgitation: mild to moderate 10/23/2015  . New onset atrial flutter ([place]) 10/04/2015  . Atrial fibrillation with rapid ventricular response ([place])   . Anemia 10/03/2015  . Stroke ([place])   . Spondylolisthesis of lumbar region 09/27/2015  . Lobar pneumonia due to unspecified organism 05/25/2015  . Snoring 03/16/2015  . Witnessed apneic spells 03/16/2015  . Obese 03/16/2015  . Morning headache 03/16/2015  . Excessive daytime sleepiness 03/16/2015  . COPD GOLD I 09/25/2014  . Upper airway cough syndrome vs cough variant asthma 08/22/2014  . Essential hypertension 08/22/2014  . Diplopia 06/25/2014  . Vision loss, bilateral 06/25/2014  . Weakness 06/25/2014  . Unspecified hereditary and idiopathic peripheral neuropathy 06/25/2014    Wynelle Fanny, PTA 04/01/2017, 10:46 AM  [facility] [address] [place], [place], [address] Phone: [phone]   Fax:  [phone]  Name: Juan Johnson MRN: [id number] Date of Birth: [birth year]

## 2017-04-01 NOTE — Patient Instructions (Addendum)
Pelvic Tilt    Flatten back by tightening stomach muscles and buttocks. Hold for 3-5 seconds. Repeat __10__ times per set. Do __2__ sets per session. Do _2-3___ sessions per day.  http://orth.exer.us/134   Copyright  VHI. All rights reserved.  Bridging    Slowly raise buttocks from floor, keeping stomach tight. Hold for 3-5 seconds. Repeat _10___ times per set. Do __2__ sets per session. Do __2-3__ sessions per day.  http://orth.exer.us/1096   Copyright  VHI. All rights reserved.  Straight Leg Raise    Tighten stomach and slowly raise locked right/left leg _6___ inches from floor. Repeat _10___ times per set. Do _2___ sets per session. Do __2-3__ sessions per day.  http://orth.exer.us/1102   Copyright  VHI. All rights reserved.   Shoulder Press  Stand at a tall filing cabinet or a surface where your arms can be at shoulder height. Press down into cabinet like trying to push cabinet into floor. You should feel your stomach muscles tighten.  Hold for 3-5 seconds. Repeat 10 times per set. Do 2 sets per session. Do 2-3 sessions per day.

## 2017-04-03 ENCOUNTER — Ambulatory Visit: Payer: Medicare Other | Admitting: *Deleted

## 2017-04-03 DIAGNOSIS — M2560 Stiffness of unspecified joint, not elsewhere classified: Secondary | ICD-10-CM

## 2017-04-03 DIAGNOSIS — R262 Difficulty in walking, not elsewhere classified: Secondary | ICD-10-CM | POA: Diagnosis not present

## 2017-04-03 DIAGNOSIS — M5416 Radiculopathy, lumbar region: Secondary | ICD-10-CM | POA: Diagnosis not present

## 2017-04-03 DIAGNOSIS — M5442 Lumbago with sciatica, left side: Secondary | ICD-10-CM | POA: Diagnosis not present

## 2017-04-03 DIAGNOSIS — R5381 Other malaise: Secondary | ICD-10-CM | POA: Diagnosis not present

## 2017-04-03 NOTE — Therapy (Signed)
[facility] [place], [place], [address] Phone: [phone]   Fax:  [phone]  Physical Therapy Treatment  Patient Details  Name: Juan Johnson MRN: [id number] Date of Birth: [birth year] Referring Provider: Luciano Cutter, MD  Encounter Date: 04/03/2017      PT End of Session - 04/03/17 1000    Visit Number 4   Number of Visits 16   Date for PT Re-Evaluation 01-Jun-2017   Authorization Type G-codes every 10th visit, KX modifier after 15 visit   PT Start Time 0945   PT Stop Time 1035   PT Time Calculation (min) 50 min      Past Medical History:  Diagnosis Date  . Anemia   . Anxiety    claustrophobia  . Arthritis   . Complication of anesthesia    difficult to wake up after neck surgery  . COPD (chronic obstructive pulmonary disease) ([place])   . Dementia   . Depression   . Enlarged liver    20-25 years ago  . GERD (gastroesophageal reflux disease)   . Glaucoma   . Hemoptysis    a. 03/2016 in setting of PNA-->resolved.  . Hypertensive heart disease   . Mitral regurgitation    a. 09/2015 Echo: EF 55-60%, no rwma, mild to mod MR, PASP 87mHg.  . Parox[place]mal [place] flutter ([place]    a. Noted 09/2015 following back surgery-->Eliquis (CHA2DS2VASc = 4);  b. 03/2016 Recurrent PAFlutter in setting of PNA-->converted spont.  . 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 Pneumonia 04/2015   "bronchial" pneumonia   . Retinal tear of left eye   . Sleep apnea    does use cpap  . Stroke ([facility] ? 05/2014   peripheral vision loss    Past Surgical History:  Procedure Laterality Date  . BACK SURGERY  09/27/2015   [place]  . CARDIAC CATHETERIZATION  2003  . COLONOSCOPY    . EYE SURGERY  10/2007  . EYE SURGERY Bilateral 2018   Eyelid  . LEG SURGERY     AT AGE 89 . LUMBAR FUSION  09/27/2015  . NECK SURGERY  06/29/2010  . SHOULDER SURGERY  09/24/2011  . TONSILLECTOMY     AT AGE 66 . VASECTOMY       There were no vitals filed for this visit.      Subjective Assessment - 04/03/17 0951    Subjective Reports that he slipped a few days ago while just walking with walking stick due to LLE giving out.   Pertinent History a-fib, COPD, h/o previous back surgery   Limitations House hold activities;Writing;Walking;Standing;Lifting   How long can you sit comfortably? 15 minutes   How long can you stand comfortably? 30 minutes, with support   Diagnostic tests X-ray, MRI prior to surgery                         [facility] - [facility] 0001      Exercises   Exercises Lumbar;Knee/Hip     Lumbar Exercises: Stretches   Single Knee to Chest Stretch 3 reps;30 seconds   Piriformis Stretch 3 reps;30 seconds     Lumbar Exercises: Aerobic   Stationary Bike Nustep: L5 x 17 minutes     Lumbar Exercises: Standing   Row Strengthening;Both;20 reps  pink XTS row V/Cs for technique   Shoulder Extension Both;20 reps;AROM;10 reps  core activation with lat  pulldown green band XTS gentle     Lumbar Exercises: Supine   Ab Set 20 reps;3 seconds   Bent Knee Raise 20 reps;2 seconds  marching     Modalities   Modalities Electrical Stimulation;Moist Heat     Moist Heat Therapy   Number Minutes Moist Heat 15 Minutes   Moist Heat Location Lumbar Spine     Electrical Stimulation   Electrical Stimulation Location B LB paras IFC x 15 mins 1-10 hz   Electrical Stimulation Goals Pain     Manual Therapy   Manual Therapy Passive ROM   Passive ROM Passive HS stretch and nerve glides. Piriformis and SKTC stretching                  PT Short Term Goals - 03/25/17 1004      PT SHORT TERM GOAL #1   Title Ind with an initial HEP.   Time 4   Period Weeks   Status New           PT Long Term Goals - 03/25/17 1005      PT LONG TERM GOAL #1   Title Patient sit 30 minutes with pain not > 3/10.   Time 8   Period Weeks   Status Achieved     PT  LONG TERM GOAL #2   Title Patient will be able to stand 15 minutes with pain < 3/10.   Time 8   Period Weeks   Status New     PT LONG TERM GOAL #3   Title Perform ADL's with pain less than 3/10.    Time 8   Period Weeks   Status Achieved     PT LONG TERM GOAL #4   Title Pt will be able to amb 500 feet with step through gait pattern with no device on community surface with pain < 3/10.    Time 8   Period Weeks   Status Not Met     PT LONG TERM GOAL #5   Title Pt will improve bilateral LE hip flexion and abduction to 5/5 in order to improve gait and functional mobility.    Time 8   Period Weeks   Status New               Plan - 04/03/17 0956    Clinical Impression Statement Pt arrived to clinic today ambulating with staff and having mild LB pain, but was more concerned of LT knee buckling. He was able to perform therex and core activation exs with V/Cs for technique.Marland Kitchen He did well  with heat and estim in hooklying with  decreased pain after RX.   History and Personal Factors relevant to plan of care: Lumbar fusion 02-14-17   7 weeks 04-04-17   Rehab Potential Good   Clinical Impairments Affecting Rehab Potential COPD, h/o previous lumbar fusion   PT Frequency 2x / week   PT Duration 8 weeks   PT Treatment/Interventions ADLs/Self Care Home Management;Electrical Stimulation;Cryotherapy;Moist Heat;Ultrasound;Therapeutic exercise;Therapeutic activities;Functional mobility training;Gait training;Stair training;Patient/family education;Taping;Passive range of motion;Manual techniques   PT Next Visit Plan lumbar strengtheing, core strengthening, STW to lumbar paraspinals and pirformis/gluteals   PT Home Exercise Plan SLR, pirformis stretch   Consulted and Agree with Plan of Care Patient      Patient will benefit from skilled therapeutic intervention in order to improve the following deficits and impairments:  Abnormal gait, Postural dysfunction, Decreased range of motion, Difficulty  walking, Decreased mobility, Pain, Decreased activity tolerance,  Decreased strength  Visit Diagnosis: Acute bilateral low back pain with left-sided sciatica  Difficulty in walking  Chronic lumbar radiculopathy  Joint stiffness of spine  Debility     Problem List Patient Active Problem List   Diagnosis Date Noted  . GERD (gastroesophageal reflux disease) 02/17/2017  . OSA (obstructive sleep apnea) 02/17/2017  . HCAP (healthcare-associated pneumonia) 02/17/2017  . Sepsis ([place]) 02/17/2017  . Spondylolisthesis at L5-S1 level 02/14/2017  . Hypertensive heart disease   . Paroxysmal atrial flutter ([place])   . Dementia without behavioral disturbance 06/04/2016  . Dementia with behavioral disturbance 04/18/2016  . Atrial flutter ([place]) 01/11/2016  . Mitral valve regurgitation: mild to moderate 10/23/2015  . New onset atrial flutter ([place]) 10/04/2015  . Atrial fibrillation with rapid ventricular response ([place])   . Anemia 10/03/2015  . Stroke ([place])   . Spondylolisthesis of lumbar region 09/27/2015  . Lobar pneumonia due to unspecified organism 05/25/2015  . Snoring 03/16/2015  . Witnessed apneic spells 03/16/2015  . Obese 03/16/2015  . Morning headache 03/16/2015  . Excessive daytime sleepiness 03/16/2015  . COPD GOLD I 09/25/2014  . Upper airway cough syndrome vs cough variant asthma 08/22/2014  . Essential hypertension 08/22/2014  . Diplopia 06/25/2014  . Vision loss, bilateral 06/25/2014  . Weakness 06/25/2014  . Unspecified hereditary and idiopathic peripheral neuropathy 06/25/2014    Velma Agnes,CHRIS, PTA 04/03/2017, 5:06 PM  [facility] [place], [place], [address] Phone: [phone]   Fax:  [phone]  Name: Juan Johnson MRN: [id number] Date of Birth: Jan 04, 1951

## 2017-04-08 ENCOUNTER — Encounter: Payer: Self-pay | Admitting: Physical Therapy

## 2017-04-08 ENCOUNTER — Ambulatory Visit: Payer: Medicare Other | Admitting: Physical Therapy

## 2017-04-08 DIAGNOSIS — M5442 Lumbago with sciatica, left side: Secondary | ICD-10-CM | POA: Diagnosis not present

## 2017-04-08 DIAGNOSIS — M2560 Stiffness of unspecified joint, not elsewhere classified: Secondary | ICD-10-CM

## 2017-04-08 DIAGNOSIS — M5416 Radiculopathy, lumbar region: Secondary | ICD-10-CM | POA: Diagnosis not present

## 2017-04-08 DIAGNOSIS — R5381 Other malaise: Secondary | ICD-10-CM | POA: Diagnosis not present

## 2017-04-08 DIAGNOSIS — R262 Difficulty in walking, not elsewhere classified: Secondary | ICD-10-CM | POA: Diagnosis not present

## 2017-04-08 NOTE — Therapy (Signed)
[facility] [place], [place], [address] Phone: [phone]   Fax:  [phone]  Physical Therapy Treatment  Patient Details  Name: Juan Johnson MRN: [id number] Date of Birth: 08/20/1951 Referring Provider: Luciano Cutter, MD  Encounter Date: 04/08/2017      PT End of Session - 04/08/17 0950    Visit Number 5   Number of Visits 16   Date for PT Re-Evaluation 05/17/17   Authorization Type G-codes every 10th visit, KX modifier after 15 visit   PT Start Time 0946   PT Stop Time 1043   PT Time Calculation (min) 57 min   Activity Tolerance Patient tolerated treatment well   Behavior During Therapy [facility] for tasks assessed/performed      Past Medical History:  Diagnosis Date  . Anemia   . Anxiety    claustrophobia  . Arthritis   . Complication of anesthesia    difficult to wake up after neck surgery  . COPD (chronic obstructive pulmonary disease) ([place])   . Dementia   . Depression   . Enlarged liver    20-25 years ago  . GERD (gastroesophageal reflux disease)   . Glaucoma   . Hemoptysis    a. 03/2016 in setting of PNA-->resolved.  . Hypertensive heart disease   . Mitral regurgitation    a. 09/2015 Echo: EF 55-60%, no rwma, mild to mod MR, PASP 42mHg.  . Paroxysmal atrial flutter ([place]    a. Noted 09/2015 following back surgery-->Eliquis (CHA2DS2VASc = 4);  b. 03/2016 Recurrent PAFlutter in setting of PNA-->converted spont.  . 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 Pneumonia 04/2015   "bronchial" pneumonia   . Retinal tear of left eye   . Sleep apnea    does use cpap  . Stroke ([facility] ? 05/2014   peripheral vision loss    Past Surgical History:  Procedure Laterality Date  . BACK SURGERY  09/27/2015   [place]  . CARDIAC CATHETERIZATION  2003  . COLONOSCOPY    . EYE SURGERY  10/2007  . EYE SURGERY Bilateral 2018   Eyelid  . LEG SURGERY     AT AGE 66 . LUMBAR FUSION  09/27/2015  .  NECK SURGERY  06/29/2010  . SHOULDER SURGERY  09/24/2011  . TONSILLECTOMY     AT AGE 66 . VASECTOMY      There were no vitals filed for this visit.      Subjective Assessment - 04/08/17 0949    Subjective Reports that with walking his R side usually hurts but today pain is more in L hip. Can take tylenols to control the pain. Asked when he could start picking up items.   Pertinent History a-fib, COPD, h/o previous back surgery   Limitations House hold activities;Writing;Walking;Standing;Lifting   How long can you sit comfortably? 15 minutes   How long can you stand comfortably? 30 minutes, with support   Diagnostic tests X-ray, MRI prior to surgery   Patient Stated Goals Walk with pain, Walk without cane, Be able to lift 50# without difficulty   Currently in Pain? Yes   Pain Score --  No pain score provided by patient   Pain Location Hip   Pain Orientation Left   Pain Descriptors / Indicators Discomfort   Pain Type Surgical pain   Pain Onset 1 to 4 weeks ago            [facility] -  04/08/17 0001      Assessment   Medical Diagnosis Lumbar fusion    Onset Date/Surgical Date 02/14/17   Hand Dominance Right   Next MD Visit 04/13/2017     Precautions   Precaution Comments No lifting more than 10 pounds   Required Braces or Orthoses Spinal Brace  No brace donned in clinic today                     [facility] Adult PT Treatment/Exercise - 04/08/17 0001      Lumbar Exercises: Stretches   Passive Hamstring Stretch 3 reps;30 seconds  BLE   Single Knee to Chest Stretch 3 reps;30 seconds     Lumbar Exercises: Aerobic   Stationary Bike Nustep: L5 x 17 minutes     Lumbar Exercises: Standing   Row Strengthening;Both;20 reps   Row Limitations Pink XTS   Other Standing Lumbar Exercises Shoulder pressdown isometric with core x15 reps with 3 sec hold   Other Standing Lumbar Exercises Pushup at file cabinet x15 reps     Lumbar Exercises: Supine   Ab Set 20  reps;5 seconds   Bridge 20 reps;2 seconds   Straight Leg Raise 20 reps     Modalities   Modalities Electrical Stimulation;Moist Heat     Moist Heat Therapy   Number Minutes Moist Heat 15 Minutes   Moist Heat Location Lumbar Spine     Electrical Stimulation   Electrical Stimulation Location B lumbar paraspinals   Electrical Stimulation Action IFC   Electrical Stimulation Parameters 1-10 hz x15 min   Electrical Stimulation Goals Pain                  PT Short Term Goals - 03/25/17 1004      PT SHORT TERM GOAL #1   Title Ind with an initial HEP.   Time 4   Period Weeks   Status New           PT Long Term Goals - 03/25/17 1005      PT LONG TERM GOAL #1   Title Patient sit 30 minutes with pain not > 3/10.   Time 8   Period Weeks   Status Achieved     PT LONG TERM GOAL #2   Title Patient will be able to stand 15 minutes with pain < 3/10.   Time 8   Period Weeks   Status New     PT LONG TERM GOAL #3   Title Perform ADL's with pain less than 3/10.    Time 8   Period Weeks   Status Achieved     PT LONG TERM GOAL #4   Title Pt will be able to amb 500 feet with step through gait pattern with no device on community surface with pain < 3/10.    Time 8   Period Weeks   Status Not Met     PT LONG TERM GOAL #5   Title Pt will improve bilateral LE hip flexion and abduction to 5/5 in order to improve gait and functional mobility.    Time 8   Period Weeks   Status New               Plan - 04/08/17 1034    Clinical Impression Statement Patient arrived to clinic with reports of [place] with tolerable pain along L hip more today. Patient demonstrated core weakness with push ups at file cabinet today with disassociation of trunk  and pelvis with activity. Supine core strengthening then progressed to encourage proper core strength and activation. Patient still has difficulty with sit to supine and supine to sit  at this time although log  rolling completed with supine to sit. Normal modalities response noted following removal of the modalities. Patient remains very active around his [place] with walking although told to hold off on heavy carrying until MD approves.   Rehab Potential Good   Clinical Impairments Affecting Rehab Potential COPD, h/o previous lumbar fusion   PT Frequency 2x / week   PT Duration 8 weeks   PT Treatment/Interventions ADLs/Self Care Home Management;Electrical Stimulation;Cryotherapy;Moist Heat;Ultrasound;Therapeutic exercise;Therapeutic activities;Functional mobility training;Gait training;Stair training;Patient/family education;Taping;Passive range of motion;Manual techniques   PT Next Visit Plan lumbar strengtheing, core strengthening, STW to lumbar paraspinals and pirformis/gluteals   PT Home Exercise Plan SLR, pirformis stretch   Consulted and Agree with Plan of Care Patient      Patient will benefit from skilled therapeutic intervention in order to improve the following deficits and impairments:  Abnormal gait, Postural dysfunction, Decreased range of motion, Difficulty walking, Decreased mobility, Pain, Decreased activity tolerance, Decreased strength  Visit Diagnosis: Acute bilateral low back pain with left-sided sciatica  Difficulty in walking  Chronic lumbar radiculopathy  Joint stiffness of spine  Debility     Problem List Patient Active Problem List   Diagnosis Date Noted  . GERD (gastroesophageal reflux disease) 02/17/2017  . OSA (obstructive sleep apnea) 02/17/2017  . HCAP (healthcare-associated pneumonia) 02/17/2017  . Sepsis ([place]) 02/17/2017  . Spondylolisthesis at L5-S1 level 02/14/2017  . Hypertensive heart disease   . Paroxysmal atrial flutter ([place])   . Dementia without behavioral disturbance 06/04/2016  . Dementia with behavioral disturbance 04/18/2016  . Atrial flutter ([place]) 01/11/2016  . Mitral valve regurgitation: mild to moderate 10/23/2015  . New onset atrial  flutter ([place]) 10/04/2015  . Atrial fibrillation with rapid ventricular response ([place])   . Anemia 10/03/2015  . Stroke ([place])   . Spondylolisthesis of lumbar region 09/27/2015  . Lobar pneumonia due to unspecified organism 05/25/2015  . Snoring 03/16/2015  . Witnessed apneic spells 03/16/2015  . Obese 03/16/2015  . Morning headache 03/16/2015  . Excessive daytime sleepiness 03/16/2015  . COPD GOLD I 09/25/2014  . Upper airway cough syndrome vs cough variant asthma 08/22/2014  . Essential hypertension 08/22/2014  . Diplopia 06/25/2014  . Vision loss, bilateral 06/25/2014  . Weakness 06/25/2014  . Unspecified hereditary and idiopathic peripheral neuropathy 06/25/2014    Wynelle Fanny, PTA 04/08/2017, 10:51 AM  [facility] [place], [place], [address] Phone: [phone]   Fax:  [phone]  Name: Juan Johnson MRN: [id number] Date of Birth: [birth year]

## 2017-04-10 ENCOUNTER — Ambulatory Visit: Payer: Medicare Other | Admitting: Physical Therapy

## 2017-04-10 ENCOUNTER — Encounter: Payer: Self-pay | Admitting: Physical Therapy

## 2017-04-10 DIAGNOSIS — M5442 Lumbago with sciatica, left side: Secondary | ICD-10-CM

## 2017-04-10 DIAGNOSIS — R262 Difficulty in walking, not elsewhere classified: Secondary | ICD-10-CM

## 2017-04-10 DIAGNOSIS — M5416 Radiculopathy, lumbar region: Secondary | ICD-10-CM | POA: Diagnosis not present

## 2017-04-10 DIAGNOSIS — M2560 Stiffness of unspecified joint, not elsewhere classified: Secondary | ICD-10-CM

## 2017-04-10 DIAGNOSIS — R5381 Other malaise: Secondary | ICD-10-CM

## 2017-04-10 NOTE — Therapy (Signed)
[name], [place], [address] Phone: [phone]   Fax:  [phone]  Physical Therapy Treatment  Patient Details  Name: Juan Johnson MRN: [id number] Date of Birth: [birth year] Referring Provider: Luciano Cutter, MD  Encounter Date: 04/10/2017      PT End of Session - 04/10/17 0954    Visit Number 6   Number of Visits 16   Date for PT Re-Evaluation May 30, 2017   Authorization Type G-codes every 10th visit, KX modifier after 15 visit   PT Start Time 0946   PT Stop Time 1039   PT Time Calculation (min) 53 min   Activity Tolerance Patient tolerated treatment well   Behavior During Therapy [facility] for tasks assessed/performed      Past Medical History:  Diagnosis Date  . Anemia   . Anxiety    claustrophobia  . Arthritis   . Complication of anesthesia    difficult to wake up after neck surgery  . COPD (chronic obstructive pulmonary disease) ([place])   . Dementia   . Depression   . Enlarged liver    20-25 years ago  . GERD (gastroesophageal reflux disease)   . Glaucoma   . Hemoptysis    a. 03/2016 in setting of PNA-->resolved.  . Hypertensive heart disease   . Mitral regurgitation    a. 09/2015 Echo: EF 55-60%, no rwma, mild to mod MR, PASP 4mHg.  . Par[place] fl[place] ([place] [place]    a. Noted 09/2015 following back surgery-->Eliquis (CHA2DS2VASc = 4);  b. 03/2016 Recurrent PAFlutter in setting of PNA-->converted spont.  . 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 Pneumonia 04/2015   "bronchial" pneumonia   . Retinal tear of left eye   . Sleep apnea    does use cpap  . Stroke ([facility] ? 05/2014   peripheral vision loss    Past Surgical History:  Procedure Laterality Date  . BACK SURGERY  09/27/2015   [place]  . CARDIAC CATHETERIZATION  2003  . COLONOSCOPY    . EYE SURGERY  10/2007  . EYE SURGERY Bilateral 2018   Eyelid  . LEG SURGERY     AT AGE 66 08 . LUMBAR FUSION  09/27/2015  .  NECK SURGERY  06/29/2010  . SHOULDER SURGERY  09/24/2011  . TONSILLECTOMY     AT AGE 66 . VASECTOMY      There were no vitals filed for this visit.      Subjective Assessment - 04/10/17 0946    Subjective Reports that since he cannot pick up five gallon buckets so he scoops stuff.    Pertinent History a-fib, COPD, h/o previous back surgery   Limitations House hold activities;Writing;Walking;Standing;Lifting   How long can you sit comfortably? 15 minutes   How long can you stand comfortably? 30 minutes, with support   Diagnostic tests X-ray, MRI prior to surgery   Patient Stated Goals Walk with pain, Walk without cane, Be able to lift 50# without difficulty   Currently in Pain? Yes   Pain Score 3    Pain Location Back   Pain Orientation Left;Lower   Pain Descriptors / Indicators Discomfort   Pain Type Surgical pain   Pain Onset 1 to 4 weeks ago            [facility] - 04/10/17 0001      Assessment   Medical Diagnosis Lumbar fusion    Onset Date/Surgical Date 02/14/17  Hand Dominance Right   Next MD Visit 04/13/2017     Precautions   Precaution Comments No lifting more than 10 pounds   Required Braces or Orthoses Spinal Brace  No brace donned today                     [facility] Adult PT Treatment/Exercise - 04/10/17 0001      Lumbar Exercises: Stretches   Passive Hamstring Stretch 3 reps;20 seconds   Passive Hamstring Stretch Limitations BLE   Piriformis Stretch 3 reps;20 seconds   Piriformis Stretch Limitations BLE     Lumbar Exercises: Aerobic   Stationary Bike Nustep: L5 x 15 minutes     Lumbar Exercises: Standing   Row Strengthening;Both;20 reps   Row Limitations Pink XTS with pursed lip breathing for core strengthening   Other Standing Lumbar Exercises B lat pulldown pink XTS with pursed lip breathing for core strengthening x2 0reps   Other Standing Lumbar Exercises Pushup at wall x20 reps with focus on rigid trunk and pelvis for core  strengthening     Lumbar Exercises: Supine   Bridge 20 reps;3 seconds  with ball squeeze for pelvic strengthening     Modalities   Modalities Electrical Stimulation;Moist Heat     Moist Heat Therapy   Number Minutes Moist Heat 15 Minutes   Moist Heat Location Lumbar Spine     Electrical Stimulation   Electrical Stimulation Location B lumbar paraspinals   Electrical Stimulation Action IFC   Electrical Stimulation Parameters 1-10 hz x15 min   Electrical Stimulation Goals Pain                  PT Short Term Goals - 04/10/17 1040      PT SHORT TERM GOAL #1   Title Ind with an initial HEP.   Time 4   Period Weeks   Status Achieved           PT Long Term Goals - 03/25/17 1005      PT LONG TERM GOAL #1   Title Patient sit 30 minutes with pain not > 3/10.   Time 8   Period Weeks   Status Achieved     PT LONG TERM GOAL #2   Title Patient will be able to stand 15 minutes with pain < 3/10.   Time 8   Period Weeks   Status New     PT LONG TERM GOAL #3   Title Perform ADL's with pain less than 3/10.    Time 8   Period Weeks   Status Achieved     PT LONG TERM GOAL #4   Title Pt will be able to amb 500 feet with step through gait pattern with no device on community surface with pain < 3/10.    Time 8   Period Weeks   Status Not Met     PT LONG TERM GOAL #5   Title Pt will improve bilateral LE hip flexion and abduction to 5/5 in order to improve gait and functional mobility.    Time 8   Period Weeks   Status New               Plan - 04/10/17 1031    Clinical Impression Statement Patient arrived to clinic with low level low back pain today. Patient progressed with exercises today with focus on core activation and proper technique to achieve good activation of core musculature. Patient instructed for pursed lip breathing to further  progress core activation. Hip flexibility has improved with continued HS and Piriformis stretches. Patient had no other  complaints of pain except for when transferring from sit to supine in low back. Normal modalities response noted following removal of the modalities.   Rehab Potential Good   Clinical Impairments Affecting Rehab Potential COPD, h/o previous lumbar fusion   PT Frequency 2x / week   PT Duration 8 weeks   PT Treatment/Interventions ADLs/Self Care Home Management;Electrical Stimulation;Cryotherapy;Moist Heat;Ultrasound;Therapeutic exercise;Therapeutic activities;Functional mobility training;Gait training;Stair training;Patient/family education;Taping;Passive range of motion;Manual techniques   PT Next Visit Plan lumbar strengtheing, core strengthening, STW to lumbar paraspinals and pirformis/gluteals   PT Home Exercise Plan SLR, pirformis stretch   Consulted and Agree with Plan of Care Patient      Patient will benefit from skilled therapeutic intervention in order to improve the following deficits and impairments:  Abnormal gait, Postural dysfunction, Decreased range of motion, Difficulty walking, Decreased mobility, Pain, Decreased activity tolerance, Decreased strength  Visit Diagnosis: Acute bilateral low back pain with left-sided sciatica  Difficulty in walking  Chronic lumbar radiculopathy  Joint stiffness of spine  Debility     Problem List Patient Active Problem List   Diagnosis Date Noted  . GERD (gastroesophageal reflux disease) 02/17/2017  . OSA (obstructive sleep apnea) 02/17/2017  . HCAP (healthcare-associated pneumonia) 02/17/2017  . Sepsis ([place]) 02/17/2017  . Spondylolisthesis at L5-S1 level 02/14/2017  . Hypertensive heart disease   . Paroxysmal atrial flutter ([place])   . Dementia without behavioral disturbance 06/04/2016  . Dementia with behavioral disturbance 04/18/2016  . Atrial flutter ([place]) 01/11/2016  . Mitral valve regurgitation: mild to moderate 10/23/2015  . New onset atrial flutter ([place]) 10/04/2015  . Atrial fibrillation with rapid ventricular response  ([place])   . Anemia 10/03/2015  . Stroke ([place])   . Spondylolisthesis of lumbar region 09/27/2015  . Lobar pneumonia due to unspecified organism 05/25/2015  . Snoring 03/16/2015  . Witnessed apneic spells 03/16/2015  . Obese 03/16/2015  . Morning headache 03/16/2015  . Excessive daytime sleepiness 03/16/2015  . COPD GOLD I 09/25/2014  . Upper airway cough syndrome vs cough variant asthma 08/22/2014  . Essential hypertension 08/22/2014  . Diplopia 06/25/2014  . Vision loss, bilateral 06/25/2014  . Weakness 06/25/2014  . Unspecified hereditary and idiopathic peripheral neuropathy 06/25/2014    Wynelle Fanny, PTA 04/10/2017, 10:40 AM  [facility] [address] [place], [place], [address] Phone: [phone]   Fax:  [phone]  Name: LY BACCHI MRN: [id number] Date of Birth: 1951-09-09

## 2017-04-21 DIAGNOSIS — J181 Lobar pneumonia, unspecified organism: Secondary | ICD-10-CM | POA: Diagnosis not present

## 2017-04-21 DIAGNOSIS — J189 Pneumonia, unspecified organism: Secondary | ICD-10-CM | POA: Diagnosis not present

## 2017-04-21 DIAGNOSIS — R918 Other nonspecific abnormal finding of lung field: Secondary | ICD-10-CM | POA: Diagnosis not present

## 2017-04-22 ENCOUNTER — Ambulatory Visit: Payer: Medicare Other | Attending: Neurosurgery | Admitting: Physical Therapy

## 2017-04-22 ENCOUNTER — Encounter: Payer: Self-pay | Admitting: Physical Therapy

## 2017-04-22 DIAGNOSIS — M2560 Stiffness of unspecified joint, not elsewhere classified: Secondary | ICD-10-CM | POA: Insufficient documentation

## 2017-04-22 DIAGNOSIS — M5416 Radiculopathy, lumbar region: Secondary | ICD-10-CM | POA: Insufficient documentation

## 2017-04-22 DIAGNOSIS — R262 Difficulty in walking, not elsewhere classified: Secondary | ICD-10-CM | POA: Diagnosis not present

## 2017-04-22 DIAGNOSIS — R5381 Other malaise: Secondary | ICD-10-CM | POA: Insufficient documentation

## 2017-04-22 DIAGNOSIS — M5442 Lumbago with sciatica, left side: Secondary | ICD-10-CM | POA: Diagnosis not present

## 2017-04-22 NOTE — Therapy (Signed)
[facility] [place], [place], [address] Phone: [phone]   Fax:  [phone]  Physical Therapy Treatment  Patient Details  Name: Juan Johnson MRN: [id number] Date of Birth: 06-15-1951 Referring Provider: Luciano Cutter, MD  Encounter Date: 04/22/2017      PT End of Session - 04/22/17 1023    Visit Number 7   Number of Visits 16   Date for PT Re-Evaluation May 28, 2017   Authorization Type G-codes every 10th visit, KX modifier after 15 visit   PT Start Time 0948   PT Stop Time 1034   PT Time Calculation (min) 46 min   Activity Tolerance Patient tolerated treatment well   Behavior During Therapy [facility] for tasks assessed/performed      Past Medical History:  Diagnosis Date  . Anemia   . Anxiety    claustrophobia  . Arthritis   . Complication of anesthesia    difficult to wake up after neck surgery  . COPD (chronic obstructive pulmonary disease) ([place])   . Dementia   . Depression   . Enlarged liver    20-25 years ago  . GERD (gastroesophageal reflux disease)   . Glaucoma   . Hemoptysis    a. 03/2016 in setting of PNA-->resolved.  . Hypertensive heart disease   . Mitral regurgitation    a. 09/2015 Echo: EF 55-60%, no rwma, mild to mod MR, PASP 33mHg.  . Paroxysmal atrial flutter ([place]    a. Noted 09/2015 following back surgery-->Eliquis (CHA2DS2VASc = 4);  b. 03/2016 Recurrent PAFlutter in setting of PNA-->converted spont.  . 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 Pneumonia 04/2015   "bronchial" pneumonia   . Retinal tear of left eye   . Sleep apnea    does use cpap  . Stroke ([facility] ? 05/2014   peripheral vision loss    Past Surgical History:  Procedure Laterality Date  . BACK SURGERY  09/27/2015   [place]  . CARDIAC CATHETERIZATION  2003  . COLONOSCOPY    . EYE SURGERY  10/2007  . EYE SURGERY Bilateral 2018   Eyelid  . LEG SURGERY     AT AGE 66 . LUMBAR FUSION  09/27/2015  .  NECK SURGERY  06/29/2010  . SHOULDER SURGERY  09/24/2011  . TONSILLECTOMY     AT AGE 66 . VASECTOMY      There were no vitals filed for this visit.      Subjective Assessment - 04/22/17 0946    Subjective Reports that he fell multiple times over vacation and was put on a walker. Reports that his LLE gave way and caused the falls but has noticed that RLE is now giving way. Reports that pain is from L hip to mid calf.   Pertinent History a-fib, COPD, h/o previous back surgery   Limitations House hold activities;Writing;Walking;Standing;Lifting   How long can you sit comfortably? 15 minutes   How long can you stand comfortably? 30 minutes, with support   Diagnostic tests X-ray, MRI prior to surgery   Patient Stated Goals Walk with pain, Walk without cane, Be able to lift 50# without difficulty   Currently in Pain? Yes   Pain Score 2    Pain Location Back   Pain Orientation Left;Lower   Pain Descriptors / Indicators [place]   Pain Type Surgical pain   Pain Radiating Towards LLE to mid calf   Pain Onset 1 to 4 weeks ago  Multiple Pain Sites Yes   Pain Score 5   Pain Location Leg   Pain Orientation Left   Pain Descriptors / Indicators Burning            [facility] PT Assessment - 04/22/17 0001      Assessment   Medical Diagnosis Lumbar fusion    Onset Date/Surgical Date 02/14/17   Hand Dominance Right   Next MD Visit 04/28/2017     Precautions   Precaution Comments No lifting more than 10 pounds   Required Braces or Orthoses Spinal Brace  No spinal brace donned                     [facility] Adult PT Treatment/Exercise - 04/22/17 0001      Lumbar Exercises: Aerobic   Stationary Bike Nustep: L4 x 14 minutes     Lumbar Exercises: Seated   Long Arc Quad on Chair Strengthening;Both;3 sets;10 reps;Weights   LAQ on Chair Weights (lbs) 4   LAQ on Chair Limitations Reports pain with L knee extension and flexion     Knee/Hip Exercises: Standing   Forward Step Up  Both;2 sets;10 reps;Hand Hold: 2;Step Height: 6"  VCs for erect stance during exercise for proper posture     Modalities   Modalities Electrical Stimulation;Moist Heat     Moist Heat Therapy   Number Minutes Moist Heat 15 Minutes   Moist Heat Location Lumbar Spine     Electrical Stimulation   Electrical Stimulation Location B lumbar paraspinals   Electrical Stimulation Action IFC   Electrical Stimulation Parameters 1-10 hz x15 min   Electrical Stimulation Goals Pain                  PT Short Term Goals - 04/10/17 1040      PT SHORT TERM GOAL #1   Title Ind with an initial HEP.   Time 4   Period Weeks   Status Achieved           PT Long Term Goals - 03/25/17 1005      PT LONG TERM GOAL #1   Title Patient sit 30 minutes with pain not > 3/10.   Time 8   Period Weeks   Status Achieved     PT LONG TERM GOAL #2   Title Patient will be able to stand 15 minutes with pain < 3/10.   Time 8   Period Weeks   Status New     PT LONG TERM GOAL #3   Title Perform ADL's with pain less than 3/10.    Time 8   Period Weeks   Status Achieved     PT LONG TERM GOAL #4   Title Pt will be able to amb 500 feet with step through gait pattern with no device on community surface with pain < 3/10.    Time 8   Period Weeks   Status Not Met     PT LONG TERM GOAL #5   Title Pt will improve bilateral LE hip flexion and abduction to 5/5 in order to improve gait and functional mobility.    Time 8   Period Weeks   Status New               Plan - 04/22/17 1023    Clinical Impression Statement Patient ambulated into clinic with use of FWW following several falls over the past week which he refers to LE weakness and giving way. Patient experienced increased pain  with seated hip flexion and also had pain in LLE with LAQ. Forward step ups were completed today with patient experiencing intermittant RLE pain and by end of exercise had pain across low back. Modalities completed  in sitting today and patient experienced pain with transfer from sitting to standing. Patient educated to ambulate with FWW with all four legs hitting at the same times as patient was ambulating with posterior legs of FWW hitting first. Normal modalities response noted following removal of the modalities with patient reporting reduced L calf and thigh burning and pain predominately in L hip at end of treatment.   Rehab Potential Good   Clinical Impairments Affecting Rehab Potential COPD, h/o previous lumbar fusion   PT Frequency 2x / week   PT Duration 8 weeks   PT Treatment/Interventions ADLs/Self Care Home Management;Electrical Stimulation;Cryotherapy;Moist Heat;Ultrasound;Therapeutic exercise;Therapeutic activities;Functional mobility training;Gait training;Stair training;Patient/family education;Taping;Passive range of motion;Manual techniques   PT Next Visit Plan MD note required next treatment along with assessment of treatment.   PT Home Exercise Plan SLR, pirformis stretch   Consulted and Agree with Plan of Care Patient      Patient will benefit from skilled therapeutic intervention in order to improve the following deficits and impairments:  Abnormal gait, Postural dysfunction, Decreased range of motion, Difficulty walking, Decreased mobility, Pain, Decreased activity tolerance, Decreased strength  Visit Diagnosis: Acute bilateral low back pain with left-sided sciatica  Difficulty in walking  Chronic lumbar radiculopathy  Joint stiffness of spine  Debility     Problem List Patient Active Problem List   Diagnosis Date Noted  . GERD (gastroesophageal reflux disease) 02/17/2017  . OSA (obstructive sleep apnea) 02/17/2017  . HCAP (healthcare-associated pneumonia) 02/17/2017  . Sepsis ([place]) 02/17/2017  . Spondylolisthesis at L5-S1 level 02/14/2017  . Hypertensive heart disease   . Paroxysmal atrial flutter ([place])   . Dementia without behavioral disturbance 06/04/2016  .  Dementia with behavioral disturbance 04/18/2016  . Atrial flutter ([place]) 01/11/2016  . Mitral valve regurgitation: mild to moderate 10/23/2015  . New onset atrial flutter ([place]) 10/04/2015  . Atrial fibrillation with rapid ventricular response ([place])   . Anemia 10/03/2015  . Stroke ([place])   . Spondylolisthesis of lumbar region 09/27/2015  . Lobar pneumonia due to unspecified organism 05/25/2015  . Snoring 03/16/2015  . Witnessed apneic spells 03/16/2015  . Obese 03/16/2015  . Morning headache 03/16/2015  . Excessive daytime sleepiness 03/16/2015  . COPD GOLD I 09/25/2014  . Upper airway cough syndrome vs cough variant asthma 08/22/2014  . Essential hypertension 08/22/2014  . Diplopia 06/25/2014  . Vision loss, bilateral 06/25/2014  . Weakness 06/25/2014  . Unspecified hereditary and idiopathic peripheral neuropathy 06/25/2014    Wynelle Fanny, PTA 04/22/2017, 10:52 AM  [facility] [address] [place], [place], [address] Phone: [phone]   Fax:  [phone]  Name: FERD HORRIGAN MRN: [id number] Date of Birth: 1950-11-24

## 2017-04-24 ENCOUNTER — Ambulatory Visit: Payer: Medicare Other | Admitting: *Deleted

## 2017-04-24 DIAGNOSIS — M5416 Radiculopathy, lumbar region: Secondary | ICD-10-CM | POA: Diagnosis not present

## 2017-04-24 DIAGNOSIS — M2560 Stiffness of unspecified joint, not elsewhere classified: Secondary | ICD-10-CM | POA: Diagnosis not present

## 2017-04-24 DIAGNOSIS — M5442 Lumbago with sciatica, left side: Secondary | ICD-10-CM | POA: Diagnosis not present

## 2017-04-24 DIAGNOSIS — R5381 Other malaise: Secondary | ICD-10-CM | POA: Diagnosis not present

## 2017-04-24 DIAGNOSIS — R262 Difficulty in walking, not elsewhere classified: Secondary | ICD-10-CM

## 2017-04-24 NOTE — Therapy (Signed)
[facility] [place], [place], [address] Phone: [phone]   Fax:  [phone]  Physical Therapy Treatment  Patient Details  Name: Juan Johnson MRN: [id number] Date of Birth: April 01, 1951 Referring Provider: Luciano Cutter, MD  Encounter Date: 04/24/2017      PT End of Session - 04/24/17 1001    Visit Number 8   Number of Visits 16   Date for PT Re-Evaluation 2017-05-19   Authorization Type G-codes every 10th visit, KX modifier after 15 visit   PT Start Time 0945   PT Stop Time 1037   PT Time Calculation (min) 52 min      Past Medical History:  Diagnosis Date  . Anemia   . Anxiety    claustrophobia  . Arthritis   . Complication of anesthesia    difficult to wake up after neck surgery  . COPD (chronic obstructive pulmonary disease) ([place])   . Dementia   . Depression   . Enlarged liver    20-25 years ago  . GERD (gastroesophageal reflux disease)   . Glaucoma   . Hemoptysis    a. 03/2016 in setting of PNA-->resolved.  . Hypertensive heart disease   . Mitral regurgitation    a. 09/2015 Echo: EF 55-60%, no rwma, mild to mod MR, PASP 25mHg.  . Par[place]mal atrial flutter ([place]    a. Noted 09/2015 following back surgery-->Eliquis (CHA2DS2VASc = 4);  b. 03/2016 Recurrent PAFlutter in setting of PNA-->converted spont.  . 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 Pneumonia 04/2015   "bronchial" pneumonia   . Retinal tear of left eye   . Sleep apnea    does use cpap  . Stroke ([facility] ? 05/2014   peripheral vision loss    Past Surgical History:  Procedure Laterality Date  . BACK SURGERY  09/27/2015   [place]  . CARDIAC CATHETERIZATION  2003  . COLONOSCOPY    . EYE SURGERY  10/2007  . EYE SURGERY Bilateral 2018   Eyelid  . LEG SURGERY     AT AGE 66 . LUMBAR FUSION  09/27/2015  . NECK SURGERY  06/29/2010  . SHOULDER SURGERY  09/24/2011  . TONSILLECTOMY     AT AGE 66 . VASECTOMY       There were no vitals filed for this visit.      Subjective Assessment - 04/24/17 0956    Subjective Reports that he fell multiple times over vacation and was put on a walker. Reports that his LLE gave way and caused the falls but has noticed that RLE is now giving way. Reports that pain is from L hip to mid calf. Arrives today ambulating with wooden staff.   Pertinent History a-fib, COPD, h/o previous back surgery   Limitations House hold activities;Writing;Walking;Standing;Lifting   How long can you sit comfortably? 15 minutes   How long can you stand comfortably? 30 minutes, with support   Diagnostic tests X-ray, MRI prior to surgery   Patient Stated Goals Walk with pain, Walk without cane, Be able to lift 50# without difficulty   Currently in Pain? Yes   Pain Score 3    Pain Location Back   Pain Orientation Right;Left   Pain Descriptors / Indicators [place]   Pain Onset 1 to 4 weeks ago   Pain Location Leg   Pain Orientation Left   Pain Descriptors / Indicators Burning  [place] Adult PT Treatment/Exercise - 04/24/17 0001      Lumbar Exercises: Aerobic   Stationary Bike Nustep: L4 x 15  minutes     Lumbar Exercises: Standing   Row Strengthening;Both;20 reps;10 reps   Other Standing Lumbar Exercises B lat pulldown pink XTS with pursed lip breathing for core strengthening x30  reps     Lumbar Exercises: Seated   Long Arc Quad on Chair Strengthening;Both;3 sets;10 reps;Weights   LAQ on Chair Weights (lbs) 4# RT, 3#LT     Modalities   Modalities Electrical Stimulation;Moist Heat     Moist Heat Therapy   Number Minutes Moist Heat 15 Minutes   Moist Heat Location Lumbar Spine     Electrical Stimulation   Electrical Stimulation Location B lumbar paraspinals  IFC x 15 mins 80-_0    Electrical Stimulation Goals Pain                  PT Short Term Goals - 04/10/17 1040      PT SHORT TERM GOAL #1   Title Ind with an  initial HEP.   Time 4   Period Weeks   Status Achieved           PT Long Term Goals - 03/25/17 1005      PT LONG TERM GOAL #1   Title Patient sit 30 minutes with pain not > 3/10.   Time 8   Period Weeks   Status Achieved     PT LONG TERM GOAL #2   Title Patient will be able to stand 15 minutes with pain < 3/10.   Time 8   Period Weeks   Status New     PT LONG TERM GOAL #3   Title Perform ADL's with pain less than 3/10.    Time 8   Period Weeks   Status Achieved     PT LONG TERM GOAL #4   Title Pt will be able to amb 500 feet with step through gait pattern with no device on community surface with pain < 3/10.    Time 8   Period Weeks   Status Not Met     PT LONG TERM GOAL #5   Title Pt will improve bilateral LE hip flexion and abduction to 5/5 in order to improve gait and functional mobility.    Time 8   Period Weeks   Status New               Plan - 04/24/17 1030    Clinical Impression Statement Pt arrived today doing a little better since falling on Monday. He was able to ambulate today with wooden staff and not the walker. He is of course very guarded when ambulating due to both knees feeling like they want to "buckle" at times. Today's Rx focused on Core strengthening as well as Bil. LE strengthening and control. Decreased pain overall with mainly C/O  numbness in LT LE.   Rehab Potential Good   Clinical Impairments Affecting Rehab Potential COPD, h/o previous lumbar fusion   PT Frequency 2x / week   PT Duration 8 weeks   PT Treatment/Interventions ADLs/Self Care Home Management;Electrical Stimulation;Cryotherapy;Moist Heat;Ultrasound;Therapeutic exercise;Therapeutic activities;Functional mobility training;Gait training;Stair training;Patient/family education;Taping;Passive range of motion;Manual techniques   PT Next Visit Plan  Send MD note   PT Home Exercise Plan SLR, pirformis stretch   Consulted and Agree with Plan of Care Patient      Patient  will benefit from skilled therapeutic intervention in order  to improve the following deficits and impairments:  Abnormal gait, Postural dysfunction, Decreased range of motion, Difficulty walking, Decreased mobility, Pain, Decreased activity tolerance, Decreased strength  Visit Diagnosis: Acute bilateral low back pain with left-sided sciatica  Difficulty in walking  Chronic lumbar radiculopathy     Problem List Patient Active Problem List   Diagnosis Date Noted  . GERD (gastroesophageal reflux disease) 02/17/2017  . OSA (obstructive sleep apnea) 02/17/2017  . HCAP (healthcare-associated pneumonia) 02/17/2017  . Sepsis ([place]) 02/17/2017  . Spondylolisthesis at L5-S1 level 02/14/2017  . Hypertensive heart disease   . Paroxysmal atrial flutter ([place])   . Dementia without behavioral disturbance 06/04/2016  . Dementia with behavioral disturbance 04/18/2016  . Atrial flutter ([place]) 01/11/2016  . Mitral valve regurgitation: mild to moderate 10/23/2015  . New onset atrial flutter ([place]) 10/04/2015  . Atrial fibrillation with rapid ventricular response ([place])   . Anemia 10/03/2015  . Stroke ([place])   . Spondylolisthesis of lumbar region 09/27/2015  . Lobar pneumonia due to unspecified organism 05/25/2015  . Snoring 03/16/2015  . Witnessed apneic spells 03/16/2015  . Obese 03/16/2015  . Morning headache 03/16/2015  . Excessive daytime sleepiness 03/16/2015  . COPD GOLD I 09/25/2014  . Upper airway cough syndrome vs cough variant asthma 08/22/2014  . Essential hypertension 08/22/2014  . Diplopia 06/25/2014  . Vision loss, bilateral 06/25/2014  . Weakness 06/25/2014  . Unspecified hereditary and idiopathic peripheral neuropathy 06/25/2014    APPLEGATE, [place], PTA 04/24/2017, 11:51 AM Mali Applegate MPT [facility] [address] [place], [place], [address] Phone: [phone]   Fax:  [phone]  Name: SEVERIN BOU MRN: [id number] Date of  Birth: [birth year]

## 2017-04-28 DIAGNOSIS — I10 Essential (primary) hypertension: Secondary | ICD-10-CM | POA: Diagnosis not present

## 2017-04-28 DIAGNOSIS — R2681 Unsteadiness on feet: Secondary | ICD-10-CM | POA: Diagnosis not present

## 2017-04-28 DIAGNOSIS — M5136 Other intervertebral disc degeneration, lumbar region: Secondary | ICD-10-CM | POA: Diagnosis not present

## 2017-04-28 DIAGNOSIS — Z6834 Body mass index (BMI) 34.0-34.9, adult: Secondary | ICD-10-CM | POA: Diagnosis not present

## 2017-04-28 DIAGNOSIS — M5442 Lumbago with sciatica, left side: Secondary | ICD-10-CM | POA: Diagnosis not present

## 2017-04-29 ENCOUNTER — Ambulatory Visit: Payer: Medicare Other | Admitting: Physical Therapy

## 2017-04-29 ENCOUNTER — Encounter: Payer: Self-pay | Admitting: Physical Therapy

## 2017-04-29 DIAGNOSIS — R5381 Other malaise: Secondary | ICD-10-CM

## 2017-04-29 DIAGNOSIS — R262 Difficulty in walking, not elsewhere classified: Secondary | ICD-10-CM

## 2017-04-29 DIAGNOSIS — M5442 Lumbago with sciatica, left side: Secondary | ICD-10-CM | POA: Diagnosis not present

## 2017-04-29 DIAGNOSIS — M2560 Stiffness of unspecified joint, not elsewhere classified: Secondary | ICD-10-CM | POA: Diagnosis not present

## 2017-04-29 DIAGNOSIS — M5416 Radiculopathy, lumbar region: Secondary | ICD-10-CM | POA: Diagnosis not present

## 2017-04-29 NOTE — Therapy (Signed)
[facility] [place], [place], [address] Phone: [phone]   Fax:  [phone]  Physical Therapy Treatment  Patient Details  Name: Juan Johnson MRN: [id number] Date of Birth: 1950/12/18 Referring Provider: Luciano Cutter, MD  Encounter Date: 04/29/2017      PT End of Session - 04/29/17 0943    Visit Number 9   Number of Visits 16   Date for PT Re-Evaluation 2017-05-30   Authorization Type G-codes every 10th visit, KX modifier after 15 visit   PT Start Time 0947   PT Stop Time 1038   PT Time Calculation (min) 51 min   Activity Tolerance Patient tolerated treatment well   Behavior During Therapy [facility] for tasks assessed/performed      Past Medical History:  Diagnosis Date  . Anemia   . Anxiety    claustrophobia  . Arthritis   . Complication of anesthesia    difficult to wake up after neck surgery  . COPD (chronic obstructive pulmonary disease) ([place])   . Dementia   . Depression   . Enlarged liver    20-25 years ago  . GERD (gastroesophageal reflux disease)   . Glaucoma   . Hemoptysis    a. 03/2016 in setting of PNA-->resolved.  . Hypertensive heart disease   . Mitral regurgitation    a. 09/2015 Echo: EF 55-60%, no rwma, mild to mod MR, PASP 78mHg.  . [place]    a. Noted 09/2015 following back surgery-->Eliquis (CHA2DS2VASc = 4);  b. 03/2016 Recurrent PAFlutter in setting of PNA-->converted spont.  . 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 Pneumonia 04/2015   "bronchial" pneumonia   . Retinal tear of left eye   . Sleep apnea    does use cpap  . Stroke ([facility] ? 05/2014   peripheral vision loss    Past Surgical History:  Procedure Laterality Date  . BACK SURGERY  09/27/2015   [place]  . CARDIAC CATHETERIZATION  2003  . COLONOSCOPY    . EYE SURGERY  10/2007  . EYE SURGERY Bilateral 2018   Eyelid  . LEG SURGERY     AT AGE 66 . LUMBAR FUSION  09/27/2015  .  NECK SURGERY  06/29/2010  . SHOULDER SURGERY  09/24/2011  . TONSILLECTOMY     AT AGE 89 . VASECTOMY      There were no vitals filed for this visit.      Subjective Assessment - 04/29/17 0943    Subjective Reports that he went to MD yesterday and MD said that he needs to continue PT as giving way could be coming from knee or low back. Reports that MD said the leg symptoms should improve.   Pertinent History a-fib, COPD, h/o previous back surgery   Limitations House hold activities;Writing;Walking;Standing;Lifting   How long can you sit comfortably? 15 minutes   How long can you stand comfortably? 30 minutes, with support   Diagnostic tests X-ray, MRI prior to surgery   Patient Stated Goals Walk with pain, Walk without cane, Be able to lift 50# without difficulty   Currently in Pain? Yes   Pain Score 2    Pain Location Back   Pain Orientation Lower   Pain Descriptors / Indicators Discomfort   Pain Type Surgical pain   Pain Onset 1 to 4 weeks ago            [facility]  0001      Assessment   Medical Diagnosis Lumbar fusion    Onset Date/Surgical Date 02/14/17   Hand Dominance Right   Next MD Visit 04/28/2017     Precautions   Precaution Comments No lifting more than 10 pounds   Required Braces or Orthoses Spinal Brace  No brace donned today                     [facility] Adult PT Treatment/Exercise - 04/29/17 0001      Lumbar Exercises: Aerobic   Stationary Bike NuStep L7 x11 min     Lumbar Exercises: Standing   Other Standing Lumbar Exercises B forward step up 6" step x20 reps each   Other Standing Lumbar Exercises B hip flexion/ abduction x20 reps each     Lumbar Exercises: Seated   Long Arc Quad on Chair Strengthening;Both;3 sets;10 reps;Weights   LAQ on Chair Weights (lbs) 4   Sit to Stand 20 reps   Sit to Stand Limitations no UE assist     Lumbar Exercises: Supine   Straight Leg Raise Other (comment)  Attempted but unable  secondary to increased low back/buttock     Modalities   Modalities Electrical Stimulation;Moist Heat     Moist Heat Therapy   Number Minutes Moist Heat 15 Minutes   Moist Heat Location Lumbar Spine     Electrical Stimulation   Electrical Stimulation Location B lumbar paraspinals   Electrical Stimulation Action IFC   Electrical Stimulation Parameters 1-10 hz x15 min   Electrical Stimulation Goals Pain                  PT Short Term Goals - 04/10/17 1040      PT SHORT TERM GOAL #1   Title Ind with an initial HEP.   Time 4   Period Weeks   Status Achieved           PT Long Term Goals - 03/25/17 1005      PT LONG TERM GOAL #1   Title Patient sit 30 minutes with pain not > 3/10.   Time 8   Period Weeks   Status Achieved     PT LONG TERM GOAL #2   Title Patient will be able to stand 15 minutes with pain < 3/10.   Time 8   Period Weeks   Status New     PT LONG TERM GOAL #3   Title Perform ADL's with pain less than 3/10.    Time 8   Period Weeks   Status Achieved     PT LONG TERM GOAL #4   Title Pt will be able to amb 500 feet with step through gait pattern with no device on community surface with pain < 3/10.    Time 8   Period Weeks   Status Not Met     PT LONG TERM GOAL #5   Title Pt will improve bilateral LE hip flexion and abduction to 5/5 in order to improve gait and functional mobility.    Time 8   Period Weeks   Status New               Plan - 04/29/17 1021    Clinical Impression Statement Patient arrived to treatment with reports of continued LLE symptoms and weakness but arrived ambulating with wooden staff. Patient intermittantly experiences LLE giving way in stance which was experienced in parallel bars today. No complaints of increased pain during standing  exercises and no complaints during sit to stand exercises. Upon lying down for supine quad strengthening exercises patient experienced pain across low back/buttock region that  limited SLR completion. Normal modalities response noted following removal of the modalities. Patient experienced difficulty and weakness in LEs upon rising from supine to sit and took several minutes to feel comfortable to walk to front of clinic.   Rehab Potential Good   Clinical Impairments Affecting Rehab Potential COPD, h/o previous lumbar fusion   PT Frequency 2x / week   PT Duration 8 weeks   PT Treatment/Interventions ADLs/Self Care Home Management;Electrical Stimulation;Cryotherapy;Moist Heat;Ultrasound;Therapeutic exercise;Therapeutic activities;Functional mobility training;Gait training;Stair training;Patient/family education;Taping;Passive range of motion;Manual techniques   PT Next Visit Plan Continue with low back/LE strengthening with modalities PRN per MPT POC.   PT Home Exercise Plan SLR, pirformis stretch   Consulted and Agree with Plan of Care Patient      Patient will benefit from skilled therapeutic intervention in order to improve the following deficits and impairments:  Abnormal gait, Postural dysfunction, Decreased range of motion, Difficulty walking, Decreased mobility, Pain, Decreased activity tolerance, Decreased strength  Visit Diagnosis: Acute bilateral low back pain with left-sided sciatica  Difficulty in walking  Chronic lumbar radiculopathy  Joint stiffness of spine  Debility     Problem List Patient Active Problem List   Diagnosis Date Noted  . GERD (gastroesophageal reflux disease) 02/17/2017  . OSA (obstructive sleep apnea) 02/17/2017  . HCAP (healthcare-associated pneumonia) 02/17/2017  . Sepsis ([place]) 02/17/2017  . Spondylolisthesis at L5-S1 level 02/14/2017  . Hypertensive heart disease   . Paroxysmal atrial flutter ([place])   . Dementia without behavioral disturbance 06/04/2016  . Dementia with behavioral disturbance 04/18/2016  . Atrial flutter ([place]) 01/11/2016  . Mitral valve regurgitation: mild to moderate 10/23/2015  . New onset atrial  flutter ([place]) 10/04/2015  . Atrial fibrillation with rapid ventricular response ([place])   . Anemia 10/03/2015  . Stroke ([place])   . Spondylolisthesis of lumbar region 09/27/2015  . Lobar pneumonia due to unspecified organism 05/25/2015  . Snoring 03/16/2015  . Witnessed apneic spells 03/16/2015  . Obese 03/16/2015  . Morning headache 03/16/2015  . Excessive daytime sleepiness 03/16/2015  . COPD GOLD I 09/25/2014  . Upper airway cough syndrome vs cough variant asthma 08/22/2014  . Essential hypertension 08/22/2014  . Diplopia 06/25/2014  . Vision loss, bilateral 06/25/2014  . Weakness 06/25/2014  . Unspecified hereditary and idiopathic peripheral neuropathy 06/25/2014    Wynelle Fanny, PTA 04/29/2017, 11:21 AM  [facility] [place], [place], [address] Phone: [phone]   Fax:  [phone]  Name: Juan Johnson MRN: [id number] Date of Birth: [birth year]

## 2017-05-01 ENCOUNTER — Encounter: Payer: Self-pay | Admitting: Physical Therapy

## 2017-05-01 ENCOUNTER — Ambulatory Visit: Payer: Medicare Other | Admitting: Physical Therapy

## 2017-05-01 DIAGNOSIS — M5416 Radiculopathy, lumbar region: Secondary | ICD-10-CM

## 2017-05-01 DIAGNOSIS — M2560 Stiffness of unspecified joint, not elsewhere classified: Secondary | ICD-10-CM | POA: Diagnosis not present

## 2017-05-01 DIAGNOSIS — R262 Difficulty in walking, not elsewhere classified: Secondary | ICD-10-CM

## 2017-05-01 DIAGNOSIS — M5442 Lumbago with sciatica, left side: Secondary | ICD-10-CM

## 2017-05-01 DIAGNOSIS — R5381 Other malaise: Secondary | ICD-10-CM | POA: Diagnosis not present

## 2017-05-01 NOTE — Therapy (Signed)
[facility] [place], [place], [address] Phone: [phone]   Fax:  [phone]  Physical Therapy Treatment  Patient Details  Name: Juan Johnson MRN: [id number] Date of Birth: 1951/08/09 Referring Provider: Luciano Cutter, MD  Encounter Date: 05/01/2017      PT End of Session - 05/01/17 1122    Visit Number 10   Number of Visits 16   Date for PT Re-Evaluation 06/04/17   Authorization Type G-codes every 10th visit, KX modifier after 15 visit   PT Start Time 1119   PT Stop Time 1210   PT Time Calculation (min) 51 min   Activity Tolerance Patient tolerated treatment well   Behavior During Therapy [facility] for tasks assessed/performed      Past Medical History:  Diagnosis Date  . Anemia   . Anxiety    claustrophobia  . Arthritis   . Complication of anesthesia    difficult to wake up after neck surgery  . COPD (chronic obstructive pulmonary disease) ([place])   . Dementia   . Depression   . Enlarged liver    20-25 years ago  . GERD (gastroesophageal reflux disease)   . Glaucoma   . Hemoptysis    a. 03/2016 in setting of PNA-->resolved.  . Hypertensive heart disease   . Mitral regurgitation    a. 09/2015 Echo: EF 55-60%, no rwma, mild to mod MR, PASP 18mHg.  . [place]    a. Noted 09/2015 following back surgery-->Eliquis (CHA2DS2VASc = 4);  b. 03/2016 Recurrent PAFlutter in setting of PNA-->converted spont.  . 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 Pneumonia 04/2015   "bronchial" pneumonia   . Retinal tear of left eye   . Sleep apnea    does use cpap  . Stroke ([facility] ? 05/2014   peripheral vision loss    Past Surgical History:  Procedure Laterality Date  . BACK SURGERY  09/27/2015   [place]  . CARDIAC CATHETERIZATION  2003  . COLONOSCOPY    . EYE SURGERY  10/2007  . EYE SURGERY Bilateral 2018   Eyelid  . LEG SURGERY     AT AGE 66 . LUMBAR FUSION  09/27/2015  .  NECK SURGERY  06/29/2010  . SHOULDER SURGERY  09/24/2011  . TONSILLECTOMY     AT AGE [age] . VASECTOMY      There were no vitals filed for this visit.      Subjective Assessment - 05/01/17 1115    Subjective Reports that he has been able to walk better after recovering from pain following previous treatment although he was observed with L knee buckling.   Pertinent History a-fib, COPD, h/o previous back surgery   Limitations House hold activities;Writing;Walking;Standing;Lifting   How long can you sit comfortably? 15 minutes   How long can you stand comfortably? 30 minutes, with support   Diagnostic tests X-ray, MRI prior to surgery   Patient Stated Goals Walk with pain, Walk without cane, Be able to lift 50# without difficulty   Currently in Pain? No/denies            [facility] - 05[facility]17 0001      Assessment   Medical Diagnosis Lumbar fusion    Onset Date/Surgical Date 02/14/17   Hand Dominance Right   Next MD Visit 04/28/2017     Precautions   Precaution Comments No lifting more than 10 pounds   Required Braces  or Orthoses Spinal Brace  no spinal brace donned                     [facility] Adult PT Treatment/Exercise - 05/01/17 0001      Lumbar Exercises: Aerobic   Stationary Bike NuStep L5 x18 min     Knee/Hip Exercises: Standing   Hip Flexion AROM;Both;2 sets;10 reps;Knee bent   Hip Abduction AROM;Both;2 sets;10 reps;Knee straight   Forward Step Up Both;2 sets;10 reps;Hand Hold: 2;Step Height: 6"     Knee/Hip Exercises: Seated   Long Arc Quad Strengthening;Both;2 sets;10 reps;Weights   Long Arc Quad Weight 4 lbs.     Modalities   Modalities Electrical Stimulation;Moist Heat     Moist Heat Therapy   Number Minutes Moist Heat 15 Minutes   Moist Heat Location Lumbar Spine     Electrical Stimulation   Electrical Stimulation Location B lumbar paraspinals   Electrical Stimulation Action IFC   Electrical Stimulation Parameters 1-10 hz x15  min   Electrical Stimulation Goals Pain                  PT Short Term Goals - 04/10/17 1040      PT SHORT TERM GOAL #1   Title Ind with an initial HEP.   Time 4   Period Weeks   Status Achieved           PT Long Term Goals - 03/25/17 1005      PT LONG TERM GOAL #1   Title Patient sit 30 minutes with pain not > 3/10.   Time 8   Period Weeks   Status Achieved     PT LONG TERM GOAL #2   Title Patient will be able to stand 15 minutes with pain < 3/10.   Time 8   Period Weeks   Status New     PT LONG TERM GOAL #3   Title Perform ADL's with pain less than 3/10.    Time 8   Period Weeks   Status Achieved     PT LONG TERM GOAL #4   Title Pt will be able to amb 500 feet with step through gait pattern with no device on community surface with pain < 3/10.    Time 8   Period Weeks   Status Not Met     PT LONG TERM GOAL #5   Title Pt will improve bilateral LE hip flexion and abduction to 5/5 in order to improve gait and functional mobility.    Time 8   Period Weeks   Status New               Plan - 05/01/17 1232    Clinical Impression Statement Patient ambulated into clinic with only minimal L hip pain today. Ambulated into clinic still with wooden staff and observed patient experiencing episodes of L knee buckling. Patient able to tolerate all standing exercises without complaint of pain. Normal modalities response noted following removal of the modalities.    Rehab Potential Good   Clinical Impairments Affecting Rehab Potential COPD, h/o previous lumbar fusion   PT Frequency 2x / week   PT Duration 8 weeks   PT Treatment/Interventions ADLs/Self Care Home Management;Electrical Stimulation;Cryotherapy;Moist Heat;Ultrasound;Therapeutic exercise;Therapeutic activities;Functional mobility training;Gait training;Stair training;Patient/family education;Taping;Passive range of motion;Manual techniques   PT Next Visit Plan Continue with low back/LE  strengthening with modalities PRN per MPT POC.   Consulted and Agree with Plan of Care Patient  Patient will benefit from skilled therapeutic intervention in order to improve the following deficits and impairments:  Abnormal gait, Postural dysfunction, Decreased range of motion, Difficulty walking, Decreased mobility, Pain, Decreased activity tolerance, Decreased strength  Visit Diagnosis: Acute bilateral low back pain with left-sided sciatica  Difficulty in walking  Chronic lumbar radiculopathy  Joint stiffness of spine  Debility     Problem List Patient Active Problem List   Diagnosis Date Noted  . GERD (gastroesophageal reflux disease) 02/17/2017  . OSA (obstructive sleep apnea) 02/17/2017  . HCAP (healthcare-associated pneumonia) 02/17/2017  . Sepsis ([place]) 02/17/2017  . Spondylolisthesis at L5-S1 level 02/14/2017  . Hypertensive heart disease   . Paroxysmal atrial flutter ([place])   . Dementia without behavioral disturbance 06/04/2016  . Dementia with behavioral disturbance 04/18/2016  . Atrial flutter ([place]) 01/11/2016  . Mitral valve regurgitation: mild to moderate 10/23/2015  . New onset atrial flutter ([place]) 10/04/2015  . Atrial fibrillation with rapid ventricular response ([place])   . Anemia 10/03/2015  . Stroke ([place])   . Spondylolisthesis of lumbar region 09/27/2015  . Lobar pneumonia due to unspecified organism 05/25/2015  . Snoring 03/16/2015  . Witnessed apneic spells 03/16/2015  . Obese 03/16/2015  . Morning headache 03/16/2015  . Excessive daytime sleepiness 03/16/2015  . COPD GOLD I 09/25/2014  . Upper airway cough syndrome vs cough variant asthma 08/22/2014  . Essential hypertension 08/22/2014  . Diplopia 06/25/2014  . Vision loss, bilateral 06/25/2014  . Weakness 06/25/2014  . Unspecified hereditary and idiopathic peripheral neuropathy 06/25/2014    Wynelle Fanny, PTA 05/01/2017, 1:14 PM  [facility] [address] [place], [place], [address] Phone: [phone]   Fax:  [phone]  Name: KINGSLEY FARACE MRN: [id number] Date of Birth: 12-May-1951

## 2017-05-06 ENCOUNTER — Ambulatory Visit: Payer: Medicare Other

## 2017-05-06 DIAGNOSIS — M2560 Stiffness of unspecified joint, not elsewhere classified: Secondary | ICD-10-CM | POA: Diagnosis not present

## 2017-05-06 DIAGNOSIS — R262 Difficulty in walking, not elsewhere classified: Secondary | ICD-10-CM | POA: Diagnosis not present

## 2017-05-06 DIAGNOSIS — M5416 Radiculopathy, lumbar region: Secondary | ICD-10-CM

## 2017-05-06 DIAGNOSIS — R5381 Other malaise: Secondary | ICD-10-CM | POA: Diagnosis not present

## 2017-05-06 DIAGNOSIS — M5442 Lumbago with sciatica, left side: Secondary | ICD-10-CM | POA: Diagnosis not present

## 2017-05-06 NOTE — Therapy (Signed)
[facility] [place], [place], [address] Phone: [phone]   Fax:  [phone]  Physical Therapy Treatment  Patient Details  Name: Juan Johnson MRN: [id number] Date of Birth: 1951/04/11 Referring Provider: Luciano Cutter, MD  Encounter Date: 05/06/2017      PT End of Session - 05/06/17 1122    Visit Number 11   Number of Visits 16   Date for PT Re-Evaluation 05-30-17   Authorization Type G-codes every 10th visit, KX modifier after 15 visit   PT Start Time 1115   PT Stop Time 1220   PT Time Calculation (min) 65 min   Activity Tolerance Patient tolerated treatment well   Behavior During Therapy [facility] for tasks assessed/performed      Past Medical History:  Diagnosis Date  . Anemia   . Anxiety    claustrophobia  . Arthritis   . Complication of anesthesia    difficult to wake up after neck surgery  . COPD (chronic obstructive pulmonary disease) ([place])   . Dementia   . Depression   . Enlarged liver    20-25 years ago  . GERD (gastroesophageal reflux disease)   . Glaucoma   . Hemoptysis    a. 03/2016 in setting of PNA-->resolved.  . Hypertensive heart disease   . Mitral regurgitation    a. 09/2015 Echo: EF 55-60%, no rwma, mild to mod MR, PASP 63mmHg.  . Paroxysmal atrial flutter ([place])    a. Noted 09/2015 following back surgery-->Eliquis (CHA2DS2VASc = 4);  b. 03/2016 Recurrent PAFlutter in setting of PNA-->converted spont.  . 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 Pneumonia 04/2015   "bronchial" pneumonia   . Retinal tear of left eye   . Sleep apnea    does use cpap  . Stroke [facility]) ? 05/2014   peripheral vision loss    Past Surgical History:  Procedure Laterality Date  . BACK SURGERY  09/27/2015   [place]  . CARDIAC CATHETERIZATION  2003  . COLONOSCOPY    . EYE SURGERY  10/2007  . EYE SURGERY Bilateral 2018   Eyelid  . LEG SURGERY     AT AGE 83  . LUMBAR FUSION  09/27/2015  .  NECK SURGERY  06/29/2010  . SHOULDER SURGERY  09/24/2011  . TONSILLECTOMY     AT AGE 68  . VASECTOMY      There were no vitals filed for this visit.      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     Patient Stated Goals Walk with pain, Walk without cane, Be able to lift 50# without difficulty   Currently in Pain? Yes   Pain Score 2    Pain Location Back   Pain Orientation Lower   Multiple Pain Sites No            [facility] PT Assessment - 05/06/17 1139      Assessment   Medical Diagnosis Lumbar fusion      Strength   Strength Assessment Site Knee   Right/Left Hip Right;Left   Right Hip Flexion 4/5   Right Hip Extension 4/5   Right Hip ABduction 4-/5   Left Hip Flexion 4/5   Left Hip Extension 4/5   Left Hip ABduction 4-/5   Right/Left Knee Right;Left   Right Knee Flexion 5/5   Right Knee Extension  4+/5   Left Knee Flexion 4+/5   Left Knee Extension 4+/5                     [facility] Adult PT Treatment/Exercise - 05/06/17 1139      Ambulation/Gait   Ambulation/Gait Yes   Ambulation/Gait Assistance 6: Modified independent (Device/Increase time)   Ambulation Distance (Feet) 500 Feet   Assistive device None   Gait Pattern Step-through pattern;Decreased step length - right;Decreased step length - left;Decreased stride length;Decreased weight shift to left;Left flexed knee in stance;Lateral trunk lean to right;Wide base of support;Poor foot clearance - left;Poor foot clearance - right   Ambulation Surface Indoor;Level   Gait Comments Some instability however able to ambulate 546ft without AD or back pain      Self-Care   Self-Care Other Self-Care Comments   Other Self-Care Comments  discussion of pt. goal status with PT and pt.      Knee/Hip Exercises: Aerobic   Nustep NuStep: lvl 3, 12 min      Knee/Hip Exercises: Standing   Hip Flexion Both;2 sets;Knee  bent;15 reps   Hip Abduction Both;Knee straight;2 sets;15 reps     Moist Heat Therapy   Number Minutes Moist Heat 15 Minutes   Moist Heat Location Lumbar Spine     Electrical Stimulation   Electrical Stimulation Location L sided paraspinals    Electrical Stimulation Action IFC   Electrical Stimulation Parameters 80-150Hz , intensity to pt. tolerance, 15'    Electrical Stimulation Goals Pain                  PT Short Term Goals - 04/10/17 1040      PT SHORT TERM GOAL #1   Title Ind with an initial HEP.   Time 4   Period Weeks   Status Achieved           PT Long Term Goals - 05/06/17 1130      PT LONG TERM GOAL #1   Title Patient sit 30 minutes with pain not > 3/10.     Time 8   Period Weeks   Status Achieved     PT LONG TERM GOAL #2   Title Patient will be able to stand 15 minutes with pain < 3/10.   Time 8   Period Weeks   Status Achieved     PT LONG TERM GOAL #3   Title Perform ADL's with pain less than 3/10.    Time 8   Period Weeks   Status Achieved     PT LONG TERM GOAL #4   Title Pt will be able to amb 500 feet with step through gait pattern with no device on community surface with pain < 3/10.    Time 8   Period Weeks   Status Achieved     PT LONG TERM GOAL #5   Title Pt will improve bilateral LE hip flexion and abduction to 5/5 in order to improve gait and functional mobility.    Time 8   Period Weeks   Status On-going               Plan - 05/06/17 1123    Clinical Impression Statement Pt. 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  Still noting pain upon standing from prolonged sitting position.  Pt. reports he feels he is getting stronger with therapy and wishes to continue.  Pt. able to ambulate 500 ft without  AD and with supervision today without LBP and only reporting muscular fatigue.  Still with some lateral instability with gait.  Pt. able to meet all goals today with exception of LE  strength goal still grossly 4/5 LE strength.  Pt. 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  Pt. 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     PT Treatment/Interventions ADLs/Self Care Home Management;Electrical Stimulation;Cryotherapy;Moist Heat;Ultrasound;Therapeutic exercise;Therapeutic activities;Functional mobility training;Gait training;Stair training;Patient/family education;Taping;Passive range of motion;Manual techniques   PT Next Visit Plan Continue with low back/LE strengthening with modalities PRN per MPT POC.      Patient will benefit from skilled therapeutic intervention in order to improve the following deficits and impairments:  Abnormal gait, Postural dysfunction, Decreased range of motion, Difficulty walking, Decreased mobility, Pain, Decreased activity tolerance, Decreased strength  Visit Diagnosis: Acute bilateral low back pain with left-sided sciatica  Difficulty in walking  Chronic lumbar radiculopathy     Problem List Patient Active Problem List   Diagnosis Date Noted  . GERD (gastroesophageal reflux disease) 02/17/2017  . OSA (obstructive sleep apnea) 02/17/2017  . HCAP (healthcare-associated pneumonia) 02/17/2017  . Sepsis ([place]) 02/17/2017  . Spondylolisthesis at L5-S1 level 02/14/2017  . Hypertensive heart disease   . Paroxysmal atrial flutter ([place])   . Dementia without behavioral disturbance 06/04/2016  . Dementia with behavioral disturbance 04/18/2016  . Atrial flutter ([place]) 01/11/2016  . Mitral valve regurgitation: mild to moderate 10/23/2015  . New onset atrial flutter ([place]) 10/04/2015  . Atrial fibrillation with rapid ventricular response ([place])   . Anemia 10/03/2015  . Stroke ([place])   . Spondylolisthesis of lumbar region 09/27/2015  . Lobar pneumonia due to unspecified organism 05/25/2015  . Snoring 03/16/2015  . Witnessed apneic spells  03/16/2015  . Obese 03/16/2015  . Morning headache 03/16/2015  . Excessive daytime sleepiness 03/16/2015  . COPD GOLD I 09/25/2014  . Upper airway cough syndrome vs cough variant asthma 08/22/2014  . Essential hypertension 08/22/2014  . Diplopia 06/25/2014  . Vision loss, bilateral 06/25/2014  . Weakness 06/25/2014  . Unspecified hereditary and idiopathic peripheral neuropathy 06/25/2014    Bess Harvest, PTA 05/06/17 12:39 PM  [facility] [place], [place], [address] Phone: [phone]   Fax:  [phone]  Name: Juan Johnson MRN: [id number] Date of Birth: 11/13/1950

## 2017-05-08 ENCOUNTER — Encounter: Payer: Self-pay | Admitting: Physical Therapy

## 2017-05-08 ENCOUNTER — Ambulatory Visit: Payer: Medicare Other | Admitting: Physical Therapy

## 2017-05-08 DIAGNOSIS — R262 Difficulty in walking, not elsewhere classified: Secondary | ICD-10-CM

## 2017-05-08 DIAGNOSIS — M5442 Lumbago with sciatica, left side: Secondary | ICD-10-CM | POA: Diagnosis not present

## 2017-05-08 DIAGNOSIS — R5381 Other malaise: Secondary | ICD-10-CM | POA: Diagnosis not present

## 2017-05-08 DIAGNOSIS — M5416 Radiculopathy, lumbar region: Secondary | ICD-10-CM | POA: Diagnosis not present

## 2017-05-08 DIAGNOSIS — M2560 Stiffness of unspecified joint, not elsewhere classified: Secondary | ICD-10-CM

## 2017-05-08 NOTE — Therapy (Signed)
[facility] [place], [place], [address] Phone: [phone]   Fax:  [phone]  Physical Therapy Treatment  Patient Details  Name: Juan Johnson MRN: [id number] Date of Birth: 02/16/1951 Referring Provider: Luciano Cutter, MD  Encounter Date: 05/08/2017      PT End of Session - 05/08/17 1334    Visit Number 12   Number of Visits 24   Date for PT Re-Evaluation 2017/07/10   Authorization Type G-codes every 10th visit, KX modifier after 15 visit   PT Start Time 1300   PT Stop Time 1345   PT Time Calculation (min) 45 min   Activity Tolerance Patient tolerated treatment well   Behavior During Therapy [facility] for tasks assessed/performed      Past Medical History:  Diagnosis Date  . Anemia   . Anxiety    claustrophobia  . Arthritis   . Complication of anesthesia    difficult to wake up after neck surgery  . COPD (chronic obstructive pulmonary disease) ([place])   . Dementia   . Depression   . Enlarged liver    20-25 years ago  . GERD (gastroesophageal reflux disease)   . Glaucoma   . Hemoptysis    a. 03/2016 in setting of PNA-->resolved.  . Hypertensive heart disease   . Mitral regurgitation    a. 09/2015 Echo: EF 55-60%, no rwma, mild to mod MR, PASP 33mmHg.  . Paroxysmal atrial flutter ([place])    a. Noted 09/2015 following back surgery-->Eliquis (CHA2DS2VASc = 4);  b. 03/2016 Recurrent PAFlutter in setting of PNA-->converted spont.  . 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 Pneumonia 04/2015   "bronchial" pneumonia   . Retinal tear of left eye   . Sleep apnea    does use cpap  . Stroke [facility]) ? 05/2014   peripheral vision loss    Past Surgical History:  Procedure Laterality Date  . BACK SURGERY  09/27/2015   [place]  . CARDIAC CATHETERIZATION  2003  . COLONOSCOPY    . EYE SURGERY  10/2007  . EYE SURGERY Bilateral 2018   Eyelid  . LEG SURGERY     AT AGE 61  . LUMBAR FUSION  09/27/2015  .  NECK SURGERY  06/29/2010  . SHOULDER SURGERY  09/24/2011  . TONSILLECTOMY     AT AGE 77  . VASECTOMY      There were no vitals filed for this visit.      Subjective Assessment - 05/08/17 1316    Subjective Patient reported increased soreness after a very long car ride   Pertinent History a-fib, COPD, h/o previous back surgery   Limitations House hold activities;Writing;Walking;Standing;Lifting   How long can you sit comfortably? 15 minutes   How long can you stand comfortably? 30 minutes, with support   Diagnostic tests X-ray, MRI prior to surgery   Patient Stated Goals Walk with pain, Walk without cane, Be able to lift 50# without difficulty   Currently in Pain? Yes   Pain Score 5    Pain Location Back   Pain Orientation Lower   Pain Descriptors / Indicators Discomfort   Pain Type Surgical pain   Pain Onset 1 to 4 weeks ago   Aggravating Factors  prolong sitting in car   Pain Relieving Factors at rest/meds                         [facility] Adult  PT Treatment/Exercise - 05/08/17 0001      Lumbar Exercises: Aerobic   Stationary Bike NuStep L5 x18 min     Lumbar Exercises: Standing   Other Standing Lumbar Exercises pink XTS rows then latt pull for core activation x25   Other Standing Lumbar Exercises UE push down on red swiss x25 for core activation     Moist Heat Therapy   Number Minutes Moist Heat 15 Minutes   Moist Heat Location Lumbar Spine     Electrical Stimulation   Electrical Stimulation Location L sided paraspinals    Electrical Stimulation Action IFC   Electrical Stimulation Parameters 80-150hz  x22min   Electrical Stimulation Goals Pain                  PT Short Term Goals - 04/10/17 1040      PT SHORT TERM GOAL #1   Title Ind with an initial HEP.   Time 4   Period Weeks   Status Achieved           PT Long Term Goals - 05/06/17 1130      PT LONG TERM GOAL #1   Title Patient sit 30 minutes with pain not > 3/10.     Time  8   Period Weeks   Status Achieved     PT LONG TERM GOAL #2   Title Patient will be able to stand 15 minutes with pain < 3/10.   Time 8   Period Weeks   Status Achieved     PT LONG TERM GOAL #3   Title Perform ADL's with pain less than 3/10.    Time 8   Period Weeks   Status Achieved     PT LONG TERM GOAL #4   Title Pt will be able to amb 500 feet with step through gait pattern with no device on community surface with pain < 3/10.    Time 8   Period Weeks   Status Achieved     PT LONG TERM GOAL #5   Title Pt will improve bilateral LE hip flexion and abduction to 5/5 in order to improve gait and functional mobility.    Time 8   Period Weeks   Status On-going               Plan - 05/08/17 1334    Clinical Impression Statement Patient tolerated treatment fairly well today. Patient reported increased pain after a very long car ride. Today progressed slowly with exercises due to increased pain. Patient current goals ongoing due to pain deficts.    Rehab Potential Good   Clinical Impairments Affecting Rehab Potential COPD, h/o previous lumbar fusion   PT Frequency 2x / week   PT Duration 8 weeks   PT Treatment/Interventions ADLs/Self Care Home Management;Electrical Stimulation;Cryotherapy;Moist Heat;Ultrasound;Therapeutic exercise;Therapeutic activities;Functional mobility training;Gait training;Stair training;Patient/family education;Taping;Passive range of motion;Manual techniques   PT Next Visit Plan Continue with low back/LE strengthening with modalities PRN per MPT POC.   Consulted and Agree with Plan of Care Patient      Patient will benefit from skilled therapeutic intervention in order to improve the following deficits and impairments:  Abnormal gait, Postural dysfunction, Decreased range of motion, Difficulty walking, Decreased mobility, Pain, Decreased activity tolerance, Decreased strength  Visit Diagnosis: Difficulty in walking  Chronic lumbar  radiculopathy  Acute bilateral low back pain with left-sided sciatica  Joint stiffness of spine  Debility     Problem List Patient Active Problem List   Diagnosis  Date Noted  . GERD (gastroesophageal reflux disease) 02/17/2017  . OSA (obstructive sleep apnea) 02/17/2017  . HCAP (healthcare-associated pneumonia) 02/17/2017  . Sepsis ([place]) 02/17/2017  . Spondylolisthesis at L5-S1 level 02/14/2017  . Hypertensive heart disease   . Paroxysmal atrial flutter ([place])   . Dementia without behavioral disturbance 06/04/2016  . Dementia with behavioral disturbance 04/18/2016  . Atrial flutter ([place]) 01/11/2016  . Mitral valve regurgitation: mild to moderate 10/23/2015  . New onset atrial flutter ([place]) 10/04/2015  . Atrial fibrillation with rapid ventricular response ([place])   . Anemia 10/03/2015  . Stroke ([place])   . Spondylolisthesis of lumbar region 09/27/2015  . Lobar pneumonia due to unspecified organism 05/25/2015  . Snoring 03/16/2015  . Witnessed apneic spells 03/16/2015  . Obese 03/16/2015  . Morning headache 03/16/2015  . Excessive daytime sleepiness 03/16/2015  . COPD GOLD I 09/25/2014  . Upper airway cough syndrome vs cough variant asthma 08/22/2014  . Essential hypertension 08/22/2014  . Diplopia 06/25/2014  . Vision loss, bilateral 06/25/2014  . Weakness 06/25/2014  . Unspecified hereditary and idiopathic peripheral neuropathy 06/25/2014    Laken Lobato P, PTA 05/08/2017, 1:48 PM  [facility] [place], [place], [address] Phone: [phone]   Fax:  [phone]  Name: Juan Johnson MRN: [id number] Date of Birth: [birth year]

## 2017-05-13 DIAGNOSIS — R030 Elevated blood-pressure reading, without diagnosis of hypertension: Secondary | ICD-10-CM | POA: Diagnosis not present

## 2017-05-13 DIAGNOSIS — J440 Chronic obstructive pulmonary disease with acute lower respiratory infection: Secondary | ICD-10-CM | POA: Diagnosis not present

## 2017-05-15 ENCOUNTER — Encounter: Payer: Medicare Other | Admitting: Physical Therapy

## 2017-05-19 DIAGNOSIS — R05 Cough: Secondary | ICD-10-CM | POA: Diagnosis not present

## 2017-05-19 DIAGNOSIS — J449 Chronic obstructive pulmonary disease, unspecified: Secondary | ICD-10-CM | POA: Diagnosis not present

## 2017-05-19 DIAGNOSIS — I10 Essential (primary) hypertension: Secondary | ICD-10-CM | POA: Diagnosis not present

## 2017-05-19 DIAGNOSIS — G4733 Obstructive sleep apnea (adult) (pediatric): Secondary | ICD-10-CM | POA: Diagnosis not present

## 2017-05-19 DIAGNOSIS — Z8701 Personal history of pneumonia (recurrent): Secondary | ICD-10-CM | POA: Diagnosis not present

## 2017-05-20 ENCOUNTER — Ambulatory Visit: Payer: Medicare Other | Attending: Neurosurgery | Admitting: *Deleted

## 2017-05-20 DIAGNOSIS — R262 Difficulty in walking, not elsewhere classified: Secondary | ICD-10-CM | POA: Insufficient documentation

## 2017-05-20 DIAGNOSIS — M2560 Stiffness of unspecified joint, not elsewhere classified: Secondary | ICD-10-CM | POA: Diagnosis not present

## 2017-05-20 DIAGNOSIS — R5381 Other malaise: Secondary | ICD-10-CM | POA: Insufficient documentation

## 2017-05-20 DIAGNOSIS — M5416 Radiculopathy, lumbar region: Secondary | ICD-10-CM | POA: Insufficient documentation

## 2017-05-20 DIAGNOSIS — M5442 Lumbago with sciatica, left side: Secondary | ICD-10-CM | POA: Insufficient documentation

## 2017-05-20 NOTE — Therapy (Signed)
[facility] [place], [place], [address] Phone: [phone]   Fax:  [phone]  Physical Therapy Treatment  Patient Details  Name: STPEHEN PETITJEAN MRN: [id number] Date of Birth: Aug 29, 1951 Referring Provider: Luciano Cutter, MD  Encounter Date: 05/20/2017      PT End of Session - 05/20/17 1005    Visit Number 13   Number of Visits 24   Date for PT Re-Evaluation July 16, 2017   Authorization Type G-codes every 10th visit, KX modifier after 15 visit   PT Start Time 0945   PT Stop Time 1035   PT Time Calculation (min) 50 min      Past Medical History:  Diagnosis Date  . Anemia   . Anxiety    claustrophobia  . Arthritis   . Complication of anesthesia    difficult to wake up after neck surgery  . COPD (chronic obstructive pulmonary disease) ([place])   . Dementia   . Depression   . Enlarged liver    20-25 years ago  . GERD (gastroesophageal reflux disease)   . Glaucoma   . Hemoptysis    a. 03/2016 in setting of PNA-->resolved.  . Hypertensive heart disease   . Mitral regurgitation    a. 09/2015 Echo: EF 55-60%, no rwma, mild to mod MR, PASP 58mmHg.  . Paroxysmal atrial flutter ([place])    a. Noted 09/2015 following back surgery-->Eliquis (CHA2DS2VASc = 4);  b. 03/2016 Recurrent PAFlutter in setting of PNA-->converted spont.  . 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 Pneumonia 04/2015   "bronchial" pneumonia   . Retinal tear of left eye   . Sleep apnea    does use cpap  . Stroke [facility]) ? 05/2014   peripheral vision loss    Past Surgical History:  Procedure Laterality Date  . BACK SURGERY  09/27/2015   [place]  . CARDIAC CATHETERIZATION  2003  . COLONOSCOPY    . EYE SURGERY  10/2007  . EYE SURGERY Bilateral 2018   Eyelid  . LEG SURGERY     AT AGE 27  . LUMBAR FUSION  09/27/2015  . NECK SURGERY  06/29/2010  . SHOULDER SURGERY  09/24/2011  . TONSILLECTOMY     AT AGE 62  . VASECTOMY       There were no vitals filed for this visit.      Subjective Assessment - 05/20/17 0946    Subjective Patient reported increased soreness after a very long car ride. I was sick with pneumonia   Pertinent History a-fib, COPD, h/o previous back surgery   Limitations House hold activities;Writing;Walking;Standing;Lifting   How long can you sit comfortably? 15 minutes   How long can you stand comfortably? 30 minutes, with support   Diagnostic tests X-ray, MRI prior to surgery   Patient Stated Goals Walk with pain, Walk without cane, Be able to lift 50# without difficulty   Currently in Pain? Yes   Pain Score 5    Pain Location Back   Pain Orientation Lower   Pain Descriptors / Indicators Discomfort   Pain Onset 1 to 4 weeks ago                         [facility] Adult PT Treatment/Exercise - 05/20/17 0001      Exercises   Exercises Lumbar;Knee/Hip     Lumbar Exercises: Aerobic   Stationary Bike NuStep L5 x20  min  Lumbar Exercises: Standing   Other Standing Lumbar Exercises pink XTS rows2 x 20 then latt pull for core activation 2x10     Knee/Hip Exercises: Stretches   Active Hamstring Stretch Both;3 reps;30 seconds  nerve glides with ankle pumps     Knee/Hip Exercises: Standing   Rocker Board 5 minutes  balance     Electrical Stimulation   Electrical Stimulation Location L sided paraspinals  IFC x 15 mins 80-150hz    Electrical Stimulation Goals Pain                  PT Short Term Goals - 04/10/17 1040      PT SHORT TERM GOAL #1   Title Ind with an initial HEP.   Time 4   Period Weeks   Status Achieved           PT Long Term Goals - 05/06/17 1130      PT LONG TERM GOAL #1   Title Patient sit 30 minutes with pain not > 3/10.     Time 8   Period Weeks   Status Achieved     PT LONG TERM GOAL #2   Title Patient will be able to stand 15 minutes with pain < 3/10.   Time 8   Period Weeks   Status Achieved     PT LONG TERM GOAL  #3   Title Perform ADL's with pain less than 3/10.    Time 8   Period Weeks   Status Achieved     PT LONG TERM GOAL #4   Title Pt will be able to amb 500 feet with step through gait pattern with no device on community surface with pain < 3/10.    Time 8   Period Weeks   Status Achieved     PT LONG TERM GOAL #5   Title Pt will improve bilateral LE hip flexion and abduction to 5/5 in order to improve gait and functional mobility.    Time 8   Period Weeks   Status On-going               Plan - 05/20/17 1406    Rehab Potential Good   Clinical Impairments Affecting Rehab Potential COPD, h/o previous lumbar fusion   PT Frequency 2x / week   PT Duration 8 weeks   PT Treatment/Interventions ADLs/Self Care Home Management;Electrical Stimulation;Cryotherapy;Moist Heat;Ultrasound;Therapeutic exercise;Therapeutic activities;Functional mobility training;Gait training;Stair training;Patient/family education;Taping;Passive range of motion;Manual techniques   PT Next Visit Plan Continue with low back/LE strengthening with modalities PRN per MPT POC.   PT Home Exercise Plan SLR, pirformis stretch   Consulted and Agree with Plan of Care Patient      Patient will benefit from skilled therapeutic intervention in order to improve the following deficits and impairments:  Abnormal gait, Postural dysfunction, Decreased range of motion, Difficulty walking, Decreased mobility, Pain, Decreased activity tolerance, Decreased strength  Visit Diagnosis: Difficulty in walking  Chronic lumbar radiculopathy  Acute bilateral low back pain with left-sided sciatica  Joint stiffness of spine  Debility     Problem List Patient Active Problem List   Diagnosis Date Noted  . GERD (gastroesophageal reflux disease) 02/17/2017  . OSA (obstructive sleep apnea) 02/17/2017  . HCAP (healthcare-associated pneumonia) 02/17/2017  . Sepsis ([place]) 02/17/2017  . Spondylolisthesis at L5-S1 level 02/14/2017  .  Hypertensive heart disease   . Paroxysmal atrial flutter ([place])   . Dementia without behavioral disturbance 06/04/2016  . Dementia with behavioral disturbance 04/18/2016  .  Atrial flutter ([place]) 01/11/2016  . Mitral valve regurgitation: mild to moderate 10/23/2015  . New onset atrial flutter ([place]) 10/04/2015  . Atrial fibrillation with rapid ventricular response ([place])   . Anemia 10/03/2015  . Stroke ([place])   . Spondylolisthesis of lumbar region 09/27/2015  . Lobar pneumonia due to unspecified organism 05/25/2015  . Snoring 03/16/2015  . Witnessed apneic spells 03/16/2015  . Obese 03/16/2015  . Morning headache 03/16/2015  . Excessive daytime sleepiness 03/16/2015  . COPD GOLD I 09/25/2014  . Upper airway cough syndrome vs cough variant asthma 08/22/2014  . Essential hypertension 08/22/2014  . Diplopia 06/25/2014  . Vision loss, bilateral 06/25/2014  . Weakness 06/25/2014  . Unspecified hereditary and idiopathic peripheral neuropathy 06/25/2014    Zeven Kocak,CHRIS, PTA 05/20/2017, 2:09 PM  [facility] [place], [place], [address] Phone: [phone]   Fax:  [phone]  Name: HYMEN ARNETT MRN: [id number] Date of Birth: Nov 20, 1950

## 2017-05-22 ENCOUNTER — Ambulatory Visit: Payer: Medicare Other | Admitting: *Deleted

## 2017-05-22 DIAGNOSIS — R5381 Other malaise: Secondary | ICD-10-CM

## 2017-05-22 DIAGNOSIS — M5416 Radiculopathy, lumbar region: Secondary | ICD-10-CM

## 2017-05-22 DIAGNOSIS — R05 Cough: Secondary | ICD-10-CM | POA: Diagnosis not present

## 2017-05-22 DIAGNOSIS — M5442 Lumbago with sciatica, left side: Secondary | ICD-10-CM | POA: Diagnosis not present

## 2017-05-22 DIAGNOSIS — J449 Chronic obstructive pulmonary disease, unspecified: Secondary | ICD-10-CM | POA: Diagnosis not present

## 2017-05-22 DIAGNOSIS — M2560 Stiffness of unspecified joint, not elsewhere classified: Secondary | ICD-10-CM

## 2017-05-22 DIAGNOSIS — Z8701 Personal history of pneumonia (recurrent): Secondary | ICD-10-CM | POA: Diagnosis not present

## 2017-05-22 DIAGNOSIS — R262 Difficulty in walking, not elsewhere classified: Secondary | ICD-10-CM | POA: Diagnosis not present

## 2017-05-22 NOTE — Therapy (Signed)
[facility] [place], [place], [address] Phone: [phone]   Fax:  [phone]  Physical Therapy Treatment  Patient Details  Name: Juan Johnson MRN: [id number] Date of Birth: [birth year] Referring Provider: Luciano Cutter, MD  Encounter Date: 05/22/2017      PT End of Session - 05/22/17 0948    Visit Number 14   Number of Visits 24   Date for PT Re-Evaluation Jul 09, 2017   Authorization Type G-codes every 10th visit, KX modifier after 15 visit   PT Start Time 0945   PT Stop Time 1035   PT Time Calculation (min) 50 min      Past Medical History:  Diagnosis Date  . Anemia   . Anxiety    claustrophobia  . Arthritis   . Complication of anesthesia    difficult to wake up after neck surgery  . COPD (chronic obstructive pulmonary disease) ([place])   . Dementia   . Depression   . Enlarged liver    20-25 years ago  . GERD (gastroesophageal reflux disease)   . Glaucoma   . Hemoptysis    a. 03/2016 in setting of PNA-->resolved.  . Hypertensive heart disease   . Mitral regurgitation    a. 09/2015 Echo: EF 55-60%, no rwma, mild to mod MR, PASP 88mmHg.  . Paroxysmal atrial flutter ([place])    a. Noted 09/2015 following back surgery-->Eliquis (CHA2DS2VASc = 4);  b. 03/2016 Recurrent PAFlutter in setting of PNA-->converted spont.  . 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 Pneumonia 04/2015   "bronchial" pneumonia   . Retinal tear of left eye   . Sleep apnea    does use cpap  . Stroke [facility]) ? 05/2014   peripheral vision loss    Past Surgical History:  Procedure Laterality Date  . BACK SURGERY  09/27/2015   [place]  . CARDIAC CATHETERIZATION  2003  . COLONOSCOPY    . EYE SURGERY  10/2007  . EYE SURGERY Bilateral 2018   Eyelid  . LEG SURGERY     AT AGE 71  . LUMBAR FUSION  09/27/2015  . NECK SURGERY  06/29/2010  . SHOULDER SURGERY  09/24/2011  . TONSILLECTOMY     AT AGE 23  . VASECTOMY       There were no vitals filed for this visit.                       [facility] Adult PT Treatment/Exercise - 05/22/17 0001      Exercises   Exercises Lumbar;Knee/Hip     Lumbar Exercises: Aerobic   Stationary Bike NuStep L6 x10 min     Lumbar Exercises: Standing   Other Standing Lumbar Exercises pink XTS rows2 x 20 then latt pull for core activation 2x20     Knee/Hip Exercises: Stretches   Active Hamstring Stretch Both;3 reps;30 seconds  nerve glides with ankle pumps 3x10     Knee/Hip Exercises: Standing   Rocker Board 5 minutes  balance     Moist Heat Therapy   Number Minutes Moist Heat 15 Minutes   Moist Heat Location Lumbar Spine     Electrical Stimulation   Electrical Stimulation Location L sided paraspinals  IFC x 15 mins 80-150hz    Electrical Stimulation Action In Sitting next RX   Electrical Stimulation Goals Pain                  PT Short  Term Goals - 04/10/17 1040      PT SHORT TERM GOAL #1   Title Ind with an initial HEP.   Time 4   Period Weeks   Status Achieved           PT Long Term Goals - 05/06/17 1130      PT LONG TERM GOAL #1   Title Patient sit 30 minutes with pain not > 3/10.     Time 8   Period Weeks   Status Achieved     PT LONG TERM GOAL #2   Title Patient will be able to stand 15 minutes with pain < 3/10.   Time 8   Period Weeks   Status Achieved     PT LONG TERM GOAL #3   Title Perform ADL's with pain less than 3/10.    Time 8   Period Weeks   Status Achieved     PT LONG TERM GOAL #4   Title Pt will be able to amb 500 feet with step through gait pattern with no device on community surface with pain < 3/10.    Time 8   Period Weeks   Status Achieved     PT LONG TERM GOAL #5   Title Pt will improve bilateral LE hip flexion and abduction to 5/5 in order to improve gait and functional mobility.    Time 8   Period Weeks   Status On-going               Plan - 05/22/17 1040    Clinical  Impression Statement Pt arrived today doing fairly well, but still recovering from pneumonia and caughing a lot which increases his LBP. He was alaso sore from being knocked down by his dogs yesterday. He did well with core exs, but he has a very difficult time going from supine to stand . Once he gets up he can barely stand and it takes him several  minutes to be able to get up and walk with staff.  We will start having pt. sit for modalities due to these  difficulties.  We will also try standing hip extension at a wall to try to engage multifidus due to pt is unable to assume  prone  or QP positions.   PT Next Visit Plan Continue with low back/LE strengthening with modalities PRN per MPT POC.  Try standing and  leaning against wall for hip extension for multifidus strengthening    PT Home Exercise Plan SLR, pirformis stretch   Consulted and Agree with Plan of Care Patient      Patient will benefit from skilled therapeutic intervention in order to improve the following deficits and impairments:     Visit Diagnosis: Difficulty in walking  Chronic lumbar radiculopathy  Acute bilateral low back pain with left-sided sciatica  Joint stiffness of spine  Debility     Problem List Patient Active Problem List   Diagnosis Date Noted  . GERD (gastroesophageal reflux disease) 02/17/2017  . OSA (obstructive sleep apnea) 02/17/2017  . HCAP (healthcare-associated pneumonia) 02/17/2017  . Sepsis ([place]) 02/17/2017  . Spondylolisthesis at L5-S1 level 02/14/2017  . Hypertensive heart disease   . Paroxysmal atrial flutter ([place])   . Dementia without behavioral disturbance 06/04/2016  . Dementia with behavioral disturbance 04/18/2016  . Atrial flutter ([place]) 01/11/2016  . Mitral valve regurgitation: mild to moderate 10/23/2015  . New onset atrial flutter ([place]) 10/04/2015  . Atrial fibrillation with rapid ventricular response ([place])   .  Anemia 10/03/2015  . Stroke ([place])   . Spondylolisthesis of  lumbar region 09/27/2015  . Lobar pneumonia due to unspecified organism 05/25/2015  . Snoring 03/16/2015  . Witnessed apneic spells 03/16/2015  . Obese 03/16/2015  . Morning headache 03/16/2015  . Excessive daytime sleepiness 03/16/2015  . COPD GOLD I 09/25/2014  . Upper airway cough syndrome vs cough variant asthma 08/22/2014  . Essential hypertension 08/22/2014  . Diplopia 06/25/2014  . Vision loss, bilateral 06/25/2014  . Weakness 06/25/2014  . Unspecified hereditary and idiopathic peripheral neuropathy 06/25/2014    Armanii Urbanik,CHRIS, PTA 05/22/2017, 11:15 AM  [facility] [place], [place], [address] Phone: [phone]   Fax:  [phone]  Name: Juan Johnson MRN: [id number] Date of Birth: [birth year]

## 2017-05-27 ENCOUNTER — Ambulatory Visit: Payer: Medicare Other | Admitting: *Deleted

## 2017-05-27 DIAGNOSIS — M5442 Lumbago with sciatica, left side: Secondary | ICD-10-CM | POA: Diagnosis not present

## 2017-05-27 DIAGNOSIS — M5416 Radiculopathy, lumbar region: Secondary | ICD-10-CM

## 2017-05-27 DIAGNOSIS — M2560 Stiffness of unspecified joint, not elsewhere classified: Secondary | ICD-10-CM

## 2017-05-27 DIAGNOSIS — R262 Difficulty in walking, not elsewhere classified: Secondary | ICD-10-CM

## 2017-05-27 DIAGNOSIS — R5381 Other malaise: Secondary | ICD-10-CM | POA: Diagnosis not present

## 2017-05-27 NOTE — Therapy (Signed)
[facility] [place], [place], [address] Phone: [phone]   Fax:  [phone]  Physical Therapy Treatment  Patient Details  Name: Juan Johnson MRN: [id number] Date of Birth: 06-24-1951 Referring Provider: Luciano Cutter, MD  Encounter Date: 05/27/2017      PT End of Session - 05/27/17 1145    Visit Number 15   Number of Visits 24   Date for PT Re-Evaluation 06/23/17   Authorization Type G-codes every 10th visit, KX modifier after 15 visit   PT Start Time 1115   PT Stop Time 1205   PT Time Calculation (min) 50 min      Past Medical History:  Diagnosis Date  . Anemia   . Anxiety    claustrophobia  . Arthritis   . Complication of anesthesia    difficult to wake up after neck surgery  . COPD (chronic obstructive pulmonary disease) ([place])   . Dementia   . Depression   . Enlarged liver    20-25 years ago  . GERD (gastroesophageal reflux disease)   . Glaucoma   . Hemoptysis    a. 03/2016 in setting of PNA-->resolved.  . Hypertensive heart disease   . Mitral regurgitation    a. 09/2015 Echo: EF 55-60%, no rwma, mild to mod MR, PASP 31mmHg.  . Paroxysmal atrial flutter ([place])    a. Noted 09/2015 following back surgery-->Eliquis (CHA2DS2VASc = 4);  b. 03/2016 Recurrent PAFlutter in setting of PNA-->converted spont.  . 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 Pneumonia 04/2015   "bronchial" pneumonia   . Retinal tear of left eye   . Sleep apnea    does use cpap  . Stroke [facility]) ? 05/2014   peripheral vision loss    Past Surgical History:  Procedure Laterality Date  . BACK SURGERY  09/27/2015   [place]  . CARDIAC CATHETERIZATION  2003  . COLONOSCOPY    . EYE SURGERY  10/2007  . EYE SURGERY Bilateral 2018   Eyelid  . LEG SURGERY     AT AGE 29  . LUMBAR FUSION  09/27/2015  . NECK SURGERY  06/29/2010  . SHOULDER SURGERY  09/24/2011  . TONSILLECTOMY     AT AGE 36  . VASECTOMY       There were no vitals filed for this visit.      Subjective Assessment - 05/27/17 1140    Subjective Doing better today with less pain, but still catches  on me and pain is 8-9/10   Pertinent History a-fib, COPD, h/o previous back surgery   Limitations House hold activities;Writing;Walking;Standing;Lifting   How long can you sit comfortably? 15 minutes   How long can you stand comfortably? 30 minutes, with support   Diagnostic tests X-ray, MRI prior to surgery   Patient Stated Goals Walk with pain, Walk without cane, Be able to lift 50# without difficulty   Currently in Pain? Yes   Pain Score 2    Pain Location Back   Pain Orientation Lower   Pain Descriptors / Indicators Discomfort   Pain Type Surgical pain   Pain Onset 1 to 4 weeks ago                         [facility] Adult PT Treatment/Exercise - 05/27/17 0001      Exercises   Exercises Lumbar;Knee/Hip     Lumbar Exercises: Aerobic   Stationary Bike NuStep  L5 x20  min     Lumbar Exercises: Standing   Other Standing Lumbar Exercises pink XTS rows2 x 20 then latt pull for core activation 2x20     Knee/Hip Exercises: Standing   Rocker Board 5 minutes  balance     Moist Heat Therapy   Number Minutes Moist Heat 15 Minutes   Moist Heat Location Lumbar Spine     Electrical Stimulation   Electrical Stimulation Location L sided paraspinals  IFC x 15 mins 80-150hz    Electrical Stimulation Goals Pain                  PT Short Term Goals - 04/10/17 1040      PT SHORT TERM GOAL #1   Title Ind with an initial HEP.   Time 4   Period Weeks   Status Achieved           PT Long Term Goals - 05/06/17 1130      PT LONG TERM GOAL #1   Title Patient sit 30 minutes with pain not > 3/10.     Time 8   Period Weeks   Status Achieved     PT LONG TERM GOAL #2   Title Patient will be able to stand 15 minutes with pain < 3/10.   Time 8   Period Weeks   Status Achieved     PT LONG TERM GOAL #3    Title Perform ADL's with pain less than 3/10.    Time 8   Period Weeks   Status Achieved     PT LONG TERM GOAL #4   Title Pt will be able to amb 500 feet with step through gait pattern with no device on community surface with pain < 3/10.    Time 8   Period Weeks   Status Achieved     PT LONG TERM GOAL #5   Title Pt will improve bilateral LE hip flexion and abduction to 5/5 in order to improve gait and functional mobility.    Time 8   Period Weeks   Status On-going               Plan - 05/27/17 1206    Clinical Impression Statement Pt arrived today doing better with breathing and was able to perform core and balance exs again with mainly discomfort. He reports that he still has times to where it catches during sit to stand it it takes a while just to get to standing and " get my feet under me". He was seated for HMP/ Estim today instead of supine due to difficulty with transition.   Rehab Potential Good   Clinical Impairments Affecting Rehab Potential COPD, h/o previous lumbar fusion   PT Frequency 2x / week   PT Duration 8 weeks   PT Treatment/Interventions ADLs/Self Care Home Management;Electrical Stimulation;Cryotherapy;Moist Heat;Ultrasound;Therapeutic exercise;Therapeutic activities;Functional mobility training;Gait training;Stair training;Patient/family education;Taping;Passive range of motion;Manual techniques   PT Next Visit Plan Continue with low back/LE strengthening with modalities PRN per MPT POC.  Try standing and  leaning against wall for hip extension for multifidus strengthening    PT Home Exercise Plan SLR, pirformis stretch   Consulted and Agree with Plan of Care Patient      Patient will benefit from skilled therapeutic intervention in order to improve the following deficits and impairments:  Abnormal gait, Postural dysfunction, Decreased range of motion, Difficulty walking, Decreased mobility, Pain, Decreased activity tolerance, Decreased  strength  Visit Diagnosis: Difficulty in walking  Chronic lumbar radiculopathy  Joint stiffness of spine  Acute bilateral low back pain with left-sided sciatica  Debility     Problem List Patient Active Problem List   Diagnosis Date Noted  . GERD (gastroesophageal reflux disease) 02/17/2017  . OSA (obstructive sleep apnea) 02/17/2017  . HCAP (healthcare-associated pneumonia) 02/17/2017  . Sepsis ([place]) 02/17/2017  . Spondylolisthesis at L5-S1 level 02/14/2017  . Hypertensive heart disease   . Paroxysmal atrial flutter ([place])   . Dementia without behavioral disturbance 06/04/2016  . Dementia with behavioral disturbance 04/18/2016  . Atrial flutter ([place]) 01/11/2016  . Mitral valve regurgitation: mild to moderate 10/23/2015  . New onset atrial flutter ([place]) 10/04/2015  . Atrial fibrillation with rapid ventricular response ([place])   . Anemia 10/03/2015  . Stroke ([place])   . Spondylolisthesis of lumbar region 09/27/2015  . Lobar pneumonia due to unspecified organism 05/25/2015  . Snoring 03/16/2015  . Witnessed apneic spells 03/16/2015  . Obese 03/16/2015  . Morning headache 03/16/2015  . Excessive daytime sleepiness 03/16/2015  . COPD GOLD I 09/25/2014  . Upper airway cough syndrome vs cough variant asthma 08/22/2014  . Essential hypertension 08/22/2014  . Diplopia 06/25/2014  . Vision loss, bilateral 06/25/2014  . Weakness 06/25/2014  . Unspecified hereditary and idiopathic peripheral neuropathy 06/25/2014    RAMSEUR,CHRIS, PTA 05/27/2017, 12:15 PM  [facility] [place], [place], [address] Phone: [phone]   Fax:  [phone]  Name: Juan Johnson MRN: [id number] Date of Birth: 1951/10/09

## 2017-05-29 ENCOUNTER — Encounter: Payer: Self-pay | Admitting: Physical Therapy

## 2017-05-29 ENCOUNTER — Ambulatory Visit: Payer: Medicare Other | Admitting: Physical Therapy

## 2017-05-29 DIAGNOSIS — R262 Difficulty in walking, not elsewhere classified: Secondary | ICD-10-CM

## 2017-05-29 DIAGNOSIS — M5416 Radiculopathy, lumbar region: Secondary | ICD-10-CM

## 2017-05-29 DIAGNOSIS — M5442 Lumbago with sciatica, left side: Secondary | ICD-10-CM

## 2017-05-29 DIAGNOSIS — M2560 Stiffness of unspecified joint, not elsewhere classified: Secondary | ICD-10-CM | POA: Diagnosis not present

## 2017-05-29 DIAGNOSIS — R5381 Other malaise: Secondary | ICD-10-CM | POA: Diagnosis not present

## 2017-05-29 NOTE — Therapy (Signed)
[facility] [place], [place], [address] Phone: [phone]   Fax:  [phone]  Physical Therapy Treatment  Patient Details  Name: Juan Johnson MRN: [id number] Date of Birth: 25-Aug-1951 Referring Provider: Luciano Cutter, MD  Encounter Date: 05/29/2017      PT End of Session - 05/29/17 1021    Visit Number 16   Date for PT Re-Evaluation 07-02-17   Authorization Type G-codes every 10th visit, KX modifier after 15 visit   PT Start Time 0945   PT Stop Time 1037   PT Time Calculation (min) 52 min   Activity Tolerance Patient tolerated treatment well   Behavior During Therapy [facility] for tasks assessed/performed      Past Medical History:  Diagnosis Date  . Anemia   . Anxiety    claustrophobia  . Arthritis   . Complication of anesthesia    difficult to wake up after neck surgery  . COPD (chronic obstructive pulmonary disease) ([place])   . Dementia   . Depression   . Enlarged liver    20-25 years ago  . GERD (gastroesophageal reflux disease)   . Glaucoma   . Hemoptysis    a. 03/2016 in setting of PNA-->resolved.  . Hypertensive heart disease   . Mitral regurgitation    a. 09/2015 Echo: EF 55-60%, no rwma, mild to mod MR, PASP 50mmHg.  . Paroxysmal atrial flutter ([place])    a. Noted 09/2015 following back surgery-->Eliquis (CHA2DS2VASc = 4);  b. 03/2016 Recurrent PAFlutter in setting of PNA-->converted spont.  . 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 Pneumonia 04/2015   "bronchial" pneumonia   . Retinal tear of left eye   . Sleep apnea    does use cpap  . Stroke [facility]) ? 05/2014   peripheral vision loss    Past Surgical History:  Procedure Laterality Date  . BACK SURGERY  09/27/2015   [place]  . CARDIAC CATHETERIZATION  2003  . COLONOSCOPY    . EYE SURGERY  10/2007  . EYE SURGERY Bilateral 2018   Eyelid  . LEG SURGERY     AT AGE 52  . LUMBAR FUSION  09/27/2015  . NECK SURGERY   06/29/2010  . SHOULDER SURGERY  09/24/2011  . TONSILLECTOMY     AT AGE 63  . VASECTOMY      There were no vitals filed for this visit.      Subjective Assessment - 05/29/17 0953    Subjective Patient reported improvement overall and little discomfort today   Pertinent History a-fib, COPD, h/o previous back surgery   Limitations House hold activities;Writing;Walking;Standing;Lifting   How long can you sit comfortably? 15 minutes   How long can you stand comfortably? 30 minutes, with support   Diagnostic tests X-ray, MRI prior to surgery   Patient Stated Goals Walk with pain, Walk without cane, Be able to lift 50# without difficulty   Currently in Pain? Yes   Pain Score 1    Pain Location Back   Pain Orientation Lower   Pain Descriptors / Indicators Discomfort   Pain Type Surgical pain   Pain Onset More than a month ago   Pain Frequency Intermittent   Aggravating Factors  prolong sitting or prolong activity   Pain Relieving Factors at rest and meds                         [facility] Adult  PT Treatment/Exercise - 05/29/17 0001      Lumbar Exercises: Aerobic   Stationary Bike L2 x108min     Lumbar Exercises: Standing   Other Standing Lumbar Exercises pink XTS rows2 x 20 then latt pull for core activation 2x20   Other Standing Lumbar Exercises standing step out 2# ball(balance difficulty, seated 2# side/side 2x10     Knee/Hip Exercises: Standing   Rocker Board 5 minutes;Other (comment)  balance     Moist Heat Therapy   Number Minutes Moist Heat 15 Minutes   Moist Heat Location Lumbar Spine  seated      Electrical Stimulation   Electrical Stimulation Location L sided paraspinals  IFC x 15 mins 80-150hz    Electrical Stimulation Goals Pain  seated                  PT Short Term Goals - 04/10/17 1040      PT SHORT TERM GOAL #1   Title Ind with an initial HEP.   Time 4   Period Weeks   Status Achieved           PT Long Term Goals -  05/06/17 1130      PT LONG TERM GOAL #1   Title Patient sit 30 minutes with pain not > 3/10.     Time 8   Period Weeks   Status Achieved     PT LONG TERM GOAL #2   Title Patient will be able to stand 15 minutes with pain < 3/10.   Time 8   Period Weeks   Status Achieved     PT LONG TERM GOAL #3   Title Perform ADL's with pain less than 3/10.    Time 8   Period Weeks   Status Achieved     PT LONG TERM GOAL #4   Title Pt will be able to amb 500 feet with step through gait pattern with no device on community surface with pain < 3/10.    Time 8   Period Weeks   Status Achieved     PT LONG TERM GOAL #5   Title Pt will improve bilateral LE hip flexion and abduction to 5/5 in order to improve gait and functional mobility.    Time 8   Period Weeks   Status On-going               Plan - 05/29/17 1022    Clinical Impression Statement Patient tolerated treatment well today. Patient has reported improvement overall and doing exercises at home daily which has helped decrease pain. Patient able to progress today with strengthening yet some balance difficulty. Patient current goals ongoing due to strength deficts.    Rehab Potential Good   Clinical Impairments Affecting Rehab Potential COPD, h/o previous lumbar fusion   PT Frequency 2x / week   PT Duration 8 weeks   PT Treatment/Interventions ADLs/Self Care Home Management;Electrical Stimulation;Cryotherapy;Moist Heat;Ultrasound;Therapeutic exercise;Therapeutic activities;Functional mobility training;Gait training;Stair training;Patient/family education;Taping;Passive range of motion;Manual techniques   PT Next Visit Plan Continue with low back/LE strengthening with modalities PRN per MPT POC.  Try standing and  leaning against wall for hip extension for multifidus strengthening (NO SUPINE POSITION)   Consulted and Agree with Plan of Care Patient      Patient will benefit from skilled therapeutic intervention in order to improve  the following deficits and impairments:  Abnormal gait, Postural dysfunction, Decreased range of motion, Difficulty walking, Decreased mobility, Pain, Decreased activity tolerance, Decreased strength  Visit  Diagnosis: Difficulty in walking  Chronic lumbar radiculopathy  Joint stiffness of spine  Acute bilateral low back pain with left-sided sciatica  Debility     Problem List Patient Active Problem List   Diagnosis Date Noted  . GERD (gastroesophageal reflux disease) 02/17/2017  . OSA (obstructive sleep apnea) 02/17/2017  . HCAP (healthcare-associated pneumonia) 02/17/2017  . Sepsis ([place]) 02/17/2017  . Spondylolisthesis at L5-S1 level 02/14/2017  . Hypertensive heart disease   . Paroxysmal atrial flutter ([place])   . Dementia without behavioral disturbance 06/04/2016  . Dementia with behavioral disturbance 04/18/2016  . Atrial flutter ([place]) 01/11/2016  . Mitral valve regurgitation: mild to moderate 10/23/2015  . New onset atrial flutter ([place]) 10/04/2015  . Atrial fibrillation with rapid ventricular response ([place])   . Anemia 10/03/2015  . Stroke ([place])   . Spondylolisthesis of lumbar region 09/27/2015  . Lobar pneumonia due to unspecified organism 05/25/2015  . Snoring 03/16/2015  . Witnessed apneic spells 03/16/2015  . Obese 03/16/2015  . Morning headache 03/16/2015  . Excessive daytime sleepiness 03/16/2015  . COPD GOLD I 09/25/2014  . Upper airway cough syndrome vs cough variant asthma 08/22/2014  . Essential hypertension 08/22/2014  . Diplopia 06/25/2014  . Vision loss, bilateral 06/25/2014  . Weakness 06/25/2014  . Unspecified hereditary and idiopathic peripheral neuropathy 06/25/2014    Phillips Climes, PTA 05/29/2017, 10:43 AM  [facility] [place], [place], [address] Phone: [phone]   Fax:  [phone]  Name: LEOTIS ISHAM MRN: [id number] Date of Birth: [birth year]

## 2017-06-02 ENCOUNTER — Encounter: Payer: Medicare Other | Admitting: Physical Therapy

## 2017-06-02 DIAGNOSIS — R05 Cough: Secondary | ICD-10-CM | POA: Diagnosis not present

## 2017-06-02 DIAGNOSIS — I10 Essential (primary) hypertension: Secondary | ICD-10-CM | POA: Diagnosis not present

## 2017-06-02 DIAGNOSIS — J449 Chronic obstructive pulmonary disease, unspecified: Secondary | ICD-10-CM | POA: Diagnosis not present

## 2017-06-02 DIAGNOSIS — G4733 Obstructive sleep apnea (adult) (pediatric): Secondary | ICD-10-CM | POA: Diagnosis not present

## 2017-06-02 DIAGNOSIS — Z8701 Personal history of pneumonia (recurrent): Secondary | ICD-10-CM | POA: Diagnosis not present

## 2017-06-04 ENCOUNTER — Encounter: Payer: Medicare Other | Admitting: Physical Therapy

## 2017-06-05 ENCOUNTER — Encounter: Payer: Medicare Other | Admitting: Physical Therapy

## 2017-06-10 ENCOUNTER — Encounter: Payer: Medicare Other | Admitting: *Deleted

## 2017-06-11 DIAGNOSIS — Z8701 Personal history of pneumonia (recurrent): Secondary | ICD-10-CM | POA: Diagnosis not present

## 2017-06-11 DIAGNOSIS — J432 Centrilobular emphysema: Secondary | ICD-10-CM | POA: Diagnosis not present

## 2017-06-11 DIAGNOSIS — R05 Cough: Secondary | ICD-10-CM | POA: Diagnosis not present

## 2017-06-12 ENCOUNTER — Ambulatory Visit: Payer: Medicare Other | Admitting: *Deleted

## 2017-06-12 DIAGNOSIS — M5416 Radiculopathy, lumbar region: Secondary | ICD-10-CM

## 2017-06-12 DIAGNOSIS — R5381 Other malaise: Secondary | ICD-10-CM | POA: Diagnosis not present

## 2017-06-12 DIAGNOSIS — R262 Difficulty in walking, not elsewhere classified: Secondary | ICD-10-CM

## 2017-06-12 DIAGNOSIS — M5442 Lumbago with sciatica, left side: Secondary | ICD-10-CM

## 2017-06-12 DIAGNOSIS — M2560 Stiffness of unspecified joint, not elsewhere classified: Secondary | ICD-10-CM | POA: Diagnosis not present

## 2017-06-12 NOTE — Therapy (Signed)
[facility] [place], [place], [address] Phone: [phone]   Fax:  [phone]  Physical Therapy Treatment  Patient Details  Name: Juan Johnson MRN: [id number] Date of Birth: 01-12-1951 Referring Provider: Luciano Cutter, MD  Encounter Date: 06/12/2017      PT End of Session - 06/12/17 1017    Visit Number 17   Number of Visits 24   Date for PT Re-Evaluation 07-12-17   Authorization Type G-codes every 10th visit, KX modifier after 15 visit   PT Start Time 0945   PT Stop Time 1033   PT Time Calculation (min) 48 min   Equipment Utilized During Treatment Back brace      Past Medical History:  Diagnosis Date  . Anemia   . Anxiety    claustrophobia  . Arthritis   . Complication of anesthesia    difficult to wake up after neck surgery  . COPD (chronic obstructive pulmonary disease) ([place])   . Dementia   . Depression   . Enlarged liver    20-25 years ago  . GERD (gastroesophageal reflux disease)   . Glaucoma   . Hemoptysis    a. 03/2016 in setting of PNA-->resolved.  . Hypertensive heart disease   . Mitral regurgitation    a. 09/2015 Echo: EF 55-60%, no rwma, mild to mod MR, PASP 30mmHg.  . Paroxysmal atrial flutter ([place])    a. Noted 09/2015 following back surgery-->Eliquis (CHA2DS2VASc = 4);  b. 03/2016 Recurrent PAFlutter in setting of PNA-->converted spont.  . 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 Pneumonia 04/2015   "bronchial" pneumonia   . Retinal tear of left eye   . Sleep apnea    does use cpap  . Stroke [facility]) ? 05/2014   peripheral vision loss    Past Surgical History:  Procedure Laterality Date  . BACK SURGERY  09/27/2015   [place]  . CARDIAC CATHETERIZATION  2003  . COLONOSCOPY    . EYE SURGERY  10/2007  . EYE SURGERY Bilateral 2018   Eyelid  . LEG SURGERY     AT AGE 37  . LUMBAR FUSION  09/27/2015  . NECK SURGERY  06/29/2010  . SHOULDER SURGERY  09/24/2011  .  TONSILLECTOMY     AT AGE 89  . VASECTOMY      There were no vitals filed for this visit.      Subjective Assessment - 06/12/17 1007    Subjective I was doing good until I had to have an MRI of my chest yesterday and trying to get up and out of that machine about killed me. Stearns pain down LT side   Pertinent History a-fib, COPD, h/o previous back surgery   Limitations House hold activities;Writing;Walking;Standing;Lifting   How long can you sit comfortably? 15 minutes   How long can you stand comfortably? 30 minutes, with support   Diagnostic tests X-ray, MRI prior to surgery   Patient Stated Goals Walk with pain, Walk without cane, Be able to lift 50# without difficulty   Currently in Pain? Yes   Pain Score 2   10/10 when it pinches   Pain Location Back   Pain Orientation Left                         [facility] Adult PT Treatment/Exercise - 06/12/17 0001      Ambulation/Gait   Ambulation/Gait Yes   Ambulation/Gait Assistance  6: Modified independent (Device/Increase time)   Assistive device None   Gait Comments Pt was able to ambulate in clinic today without AD, but c/o LT sided pain that can come at any time     Exercises   Exercises Lumbar;Knee/Hip     Lumbar Exercises: Aerobic   Stationary Bike L5 x20   min     Lumbar Exercises: Standing   Other Standing Lumbar Exercises pink XTS rows2 x 20 then latt pull for core activation 2x20     Knee/Hip Exercises: Standing   Hip Extension AROM;Both;2 sets;10 reps                  PT Short Term Goals - 04/10/17 1040      PT SHORT TERM GOAL #1   Title Ind with an initial HEP.   Time 4   Period Weeks   Status Achieved           PT Long Term Goals - 05/06/17 1130      PT LONG TERM GOAL #1   Title Patient sit 30 minutes with pain not > 3/10.     Time 8   Period Weeks   Status Achieved     PT LONG TERM GOAL #2   Title Patient will be able to stand 15 minutes with pain < 3/10.   Time 8    Period Weeks   Status Achieved     PT LONG TERM GOAL #3   Title Perform ADL's with pain less than 3/10.    Time 8   Period Weeks   Status Achieved     PT LONG TERM GOAL #4   Title Pt will be able to amb 500 feet with step through gait pattern with no device on community surface with pain < 3/10.    Time 8   Period Weeks   Status Achieved     PT LONG TERM GOAL #5   Title Pt will improve bilateral LE hip flexion and abduction to 5/5 in order to improve gait and functional mobility.    Time 8   Period Weeks   Status On-going               Plan - 06/12/17 1056    Clinical Impression Statement Pt arrived today doing fairly well, but had a bad day yesterday after having a CT scan of his lungs due to pain gettingout of the machine. He continues to have Spanos LT sided pain that radiates into LT LE and giving away at times. He was  to complete all therex and act.'s with low pain levels and amb. in clinic without his staff   Clinical Impairments Affecting Rehab Potential COPD, h/o previous lumbar fusion   PT Frequency 2x / week   PT Duration 8 weeks   PT Treatment/Interventions ADLs/Self Care Home Management;Electrical Stimulation;Cryotherapy;Moist Heat;Ultrasound;Therapeutic exercise;Therapeutic activities;Functional mobility training;Gait training;Stair training;Patient/family education;Taping;Passive range of motion;Manual techniques   PT Next Visit Plan Continue with low back/LE strengthening with modalities PRN per MPT POC.  Try standing and  leaning against wall for hip extension for multifidus strengthening (NO SUPINE POSITION)   PT Home Exercise Plan SLR, pirformis stretch   Consulted and Agree with Plan of Care Patient      Patient will benefit from skilled therapeutic intervention in order to improve the following deficits and impairments:  Abnormal gait, Postural dysfunction, Decreased range of motion, Difficulty walking, Decreased mobility, Pain, Decreased activity  tolerance, Decreased strength  Visit Diagnosis: Difficulty  in walking  Chronic lumbar radiculopathy  Joint stiffness of spine  Debility  Acute bilateral low back pain with left-sided sciatica     Problem List Patient Active Problem List   Diagnosis Date Noted  . GERD (gastroesophageal reflux disease) 02/17/2017  . OSA (obstructive sleep apnea) 02/17/2017  . HCAP (healthcare-associated pneumonia) 02/17/2017  . Sepsis ([place]) 02/17/2017  . Spondylolisthesis at L5-S1 level 02/14/2017  . Hypertensive heart disease   . Paroxysmal atrial flutter ([place])   . Dementia without behavioral disturbance 06/04/2016  . Dementia with behavioral disturbance 04/18/2016  . Atrial flutter ([place]) 01/11/2016  . Mitral valve regurgitation: mild to moderate 10/23/2015  . New onset atrial flutter ([place]) 10/04/2015  . Atrial fibrillation with rapid ventricular response ([place])   . Anemia 10/03/2015  . Stroke ([place])   . Spondylolisthesis of lumbar region 09/27/2015  . Lobar pneumonia due to unspecified organism 05/25/2015  . Snoring 03/16/2015  . Witnessed apneic spells 03/16/2015  . Obese 03/16/2015  . Morning headache 03/16/2015  . Excessive daytime sleepiness 03/16/2015  . COPD GOLD I 09/25/2014  . Upper airway cough syndrome vs cough variant asthma 08/22/2014  . Essential hypertension 08/22/2014  . Diplopia 06/25/2014  . Vision loss, bilateral 06/25/2014  . Weakness 06/25/2014  . Unspecified hereditary and idiopathic peripheral neuropathy 06/25/2014    Alexandria Current,CHRIS, PTA 06/12/2017, 11:17 AM  [facility] [place], [place], [address] Phone: [phone]   Fax:  [phone]  Name: Juan Johnson MRN: [id number] Date of Birth: 01-17-1951

## 2017-06-13 ENCOUNTER — Encounter: Payer: Self-pay | Admitting: Physical Therapy

## 2017-06-13 ENCOUNTER — Ambulatory Visit: Payer: Medicare Other | Admitting: Physical Therapy

## 2017-06-13 DIAGNOSIS — M2560 Stiffness of unspecified joint, not elsewhere classified: Secondary | ICD-10-CM | POA: Diagnosis not present

## 2017-06-13 DIAGNOSIS — M5416 Radiculopathy, lumbar region: Secondary | ICD-10-CM | POA: Diagnosis not present

## 2017-06-13 DIAGNOSIS — R5381 Other malaise: Secondary | ICD-10-CM | POA: Diagnosis not present

## 2017-06-13 DIAGNOSIS — R262 Difficulty in walking, not elsewhere classified: Secondary | ICD-10-CM | POA: Diagnosis not present

## 2017-06-13 DIAGNOSIS — M5442 Lumbago with sciatica, left side: Secondary | ICD-10-CM

## 2017-06-13 NOTE — Therapy (Signed)
[facility] [place], [place], [address] Phone: [phone]   Fax:  [phone]  Physical Therapy Treatment  Patient Details  Name: Juan Johnson MRN: [id number] Date of Birth: Apr 30, 1951 Referring Provider: Luciano Cutter, MD  Encounter Date: 06/13/2017      PT End of Session - 06/13/17 1119    Visit Number 18   Number of Visits 24   Date for PT Re-Evaluation 06/19/2017   Authorization Type G-codes every 10th visit, KX modifier after 15 visit   PT Start Time 1117   PT Stop Time 1200   PT Time Calculation (min) 43 min   Activity Tolerance Patient tolerated treatment well   Behavior During Therapy [facility] for tasks assessed/performed      Past Medical History:  Diagnosis Date  . Anemia   . Anxiety    claustrophobia  . Arthritis   . Complication of anesthesia    difficult to wake up after neck surgery  . COPD (chronic obstructive pulmonary disease) ([place])   . Dementia   . Depression   . Enlarged liver    20-25 years ago  . GERD (gastroesophageal reflux disease)   . Glaucoma   . Hemoptysis    a. 03/2016 in setting of PNA-->resolved.  . Hypertensive heart disease   . Mitral regurgitation    a. 09/2015 Echo: EF 55-60%, no rwma, mild to mod MR, PASP 12mmHg.  . Paroxysmal atrial flutter ([place])    a. Noted 09/2015 following back surgery-->Eliquis (CHA2DS2VASc = 4);  b. 03/2016 Recurrent PAFlutter in setting of PNA-->converted spont.  . 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 Pneumonia 04/2015   "bronchial" pneumonia   . Retinal tear of left eye   . Sleep apnea    does use cpap  . Stroke [facility]) ? 05/2014   peripheral vision loss    Past Surgical History:  Procedure Laterality Date  . BACK SURGERY  09/27/2015   [place]  . CARDIAC CATHETERIZATION  2003  . COLONOSCOPY    . EYE SURGERY  10/2007  . EYE SURGERY Bilateral 2018   Eyelid  . LEG SURGERY     AT AGE 68  . LUMBAR FUSION  09/27/2015  .  NECK SURGERY  06/29/2010  . SHOULDER SURGERY  09/24/2011  . TONSILLECTOMY     AT AGE 12  . VASECTOMY      There were no vitals filed for this visit.      Subjective Assessment - 06/13/17 1119    Subjective "As long as I don't lay down I'm good."   Pertinent History a-fib, COPD, h/o previous back surgery   Limitations House hold activities;Writing;Walking;Standing;Lifting   How long can you sit comfortably? 15 minutes   How long can you stand comfortably? 30 minutes, with support   Diagnostic tests X-ray, MRI prior to surgery   Patient Stated Goals Walk with pain, Walk without cane, Be able to lift 50# without difficulty   Currently in Pain? No/denies            [facility] PT Assessment - 06/13/17 0001      Assessment   Medical Diagnosis Lumbar fusion    Onset Date/Surgical Date 02/14/17   Hand Dominance Right     Precautions   Precaution Comments No lifting more than 10 pounds                     [facility] Adult PT Treatment/Exercise -  06/13/17 0001      Lumbar Exercises: Aerobic   Stationary Bike L6 x16 min     Lumbar Exercises: Standing   Forward Lunge 15 reps  with 2# reachouts bilaterally with one UE support   Shoulder Extension Strengthening;Both;20 reps   Shoulder Extension Limitations Pink XTS   Other Standing Lumbar Exercises Punches with Pink XTS x20 reps   Other Standing Lumbar Exercises Multifidus pushouts green theraband x20 reps B, wall pushups x20 reps     Lumbar Exercises: Seated   Sit to Stand 10 reps   Sit to Stand Limitations without UE support from low plinth     Knee/Hip Exercises: Standing   Hip Flexion AROM;Both;2 sets;10 reps;Knee bent   Hip Abduction AROM;Both;2 sets;10 reps;Knee straight   Other Standing Knee Exercises 2# ball OH lift with B rotation included x20 reps                  PT Short Term Goals - 04/10/17 1040      PT SHORT TERM GOAL #1   Title Ind with an initial HEP.   Time 4   Period Weeks   Status  Achieved           PT Long Term Goals - 05/06/17 1130      PT LONG TERM GOAL #1   Title Patient sit 30 minutes with pain not > 3/10.     Time 8   Period Weeks   Status Achieved     PT LONG TERM GOAL #2   Title Patient will be able to stand 15 minutes with pain < 3/10.   Time 8   Period Weeks   Status Achieved     PT LONG TERM GOAL #3   Title Perform ADL's with pain less than 3/10.    Time 8   Period Weeks   Status Achieved     PT LONG TERM GOAL #4   Title Pt will be able to amb 500 feet with step through gait pattern with no device on community surface with pain < 3/10.    Time 8   Period Weeks   Status Achieved     PT LONG TERM GOAL #5   Title Pt will improve bilateral LE hip flexion and abduction to 5/5 in order to improve gait and functional mobility.    Time 8   Period Weeks   Status On-going               Plan - 06/13/17 1215    Clinical Impression Statement Patient tolerated today's treatment well with no complaints of any increased pain with any of the exercises. Patient required VCs throughout treatment for reinforcement of exercise technique as well as VCs for erect spine. Patient had no difficulty with sit to stands without UE support at low plinth height. Patient denied any modalities she has not seen any relief from them. Patient continues to experience cramping like sensation in L HS.   Rehab Potential Good   Clinical Impairments Affecting Rehab Potential COPD, h/o previous lumbar fusion   PT Frequency 2x / week   PT Duration 8 weeks   PT Treatment/Interventions ADLs/Self Care Home Management;Electrical Stimulation;Cryotherapy;Moist Heat;Ultrasound;Therapeutic exercise;Therapeutic activities;Functional mobility training;Gait training;Stair training;Patient/family education;Taping;Passive range of motion;Manual techniques   PT Next Visit Plan Continue with low back/LE strengthening with modalities PRN per MPT POC.  Try standing and  leaning against  wall for hip extension for multifidus strengthening (NO SUPINE POSITION)   PT Home Exercise  Plan SLR, pirformis stretch   Consulted and Agree with Plan of Care Patient      Patient will benefit from skilled therapeutic intervention in order to improve the following deficits and impairments:  Abnormal gait, Postural dysfunction, Decreased range of motion, Difficulty walking, Decreased mobility, Pain, Decreased activity tolerance, Decreased strength  Visit Diagnosis: Difficulty in walking  Chronic lumbar radiculopathy  Joint stiffness of spine  Acute bilateral low back pain with left-sided sciatica  Debility     Problem List Patient Active Problem List   Diagnosis Date Noted  . GERD (gastroesophageal reflux disease) 02/17/2017  . OSA (obstructive sleep apnea) 02/17/2017  . HCAP (healthcare-associated pneumonia) 02/17/2017  . Sepsis ([place]) 02/17/2017  . Spondylolisthesis at L5-S1 level 02/14/2017  . Hypertensive heart disease   . Paroxysmal atrial flutter ([place])   . Dementia without behavioral disturbance 06/04/2016  . Dementia with behavioral disturbance 04/18/2016  . Atrial flutter ([place]) 01/11/2016  . Mitral valve regurgitation: mild to moderate 10/23/2015  . New onset atrial flutter ([place]) 10/04/2015  . Atrial fibrillation with rapid ventricular response ([place])   . Anemia 10/03/2015  . Stroke ([place])   . Spondylolisthesis of lumbar region 09/27/2015  . Lobar pneumonia due to unspecified organism 05/25/2015  . Snoring 03/16/2015  . Witnessed apneic spells 03/16/2015  . Obese 03/16/2015  . Morning headache 03/16/2015  . Excessive daytime sleepiness 03/16/2015  . COPD GOLD I 09/25/2014  . Upper airway cough syndrome vs cough variant asthma 08/22/2014  . Essential hypertension 08/22/2014  . Diplopia 06/25/2014  . Vision loss, bilateral 06/25/2014  . Weakness 06/25/2014  . Unspecified hereditary and idiopathic peripheral neuropathy 06/25/2014    Wynelle Fanny,  PTA 06/13/2017, 12:20 PM  [facility] [place], [place], [address] Phone: [phone]   Fax:  [phone]  Name: Juan Johnson MRN: [id number] Date of Birth: [birth year]

## 2017-06-18 ENCOUNTER — Encounter: Payer: Self-pay | Admitting: Physical Therapy

## 2017-06-18 ENCOUNTER — Ambulatory Visit: Payer: Medicare Other | Attending: Neurosurgery | Admitting: Physical Therapy

## 2017-06-18 DIAGNOSIS — M2560 Stiffness of unspecified joint, not elsewhere classified: Secondary | ICD-10-CM

## 2017-06-18 DIAGNOSIS — M5416 Radiculopathy, lumbar region: Secondary | ICD-10-CM | POA: Diagnosis not present

## 2017-06-18 DIAGNOSIS — R262 Difficulty in walking, not elsewhere classified: Secondary | ICD-10-CM | POA: Diagnosis not present

## 2017-06-18 DIAGNOSIS — R5381 Other malaise: Secondary | ICD-10-CM | POA: Insufficient documentation

## 2017-06-18 DIAGNOSIS — M5442 Lumbago with sciatica, left side: Secondary | ICD-10-CM | POA: Diagnosis not present

## 2017-06-18 NOTE — Therapy (Addendum)
[facility] [place], [place], [address] Phone: [phone]   Fax:  [phone]  Physical Therapy Treatment Discharge  Patient Details  Name: Juan Johnson MRN: [id number] Date of Birth: 27-Nov-1950 Referring Provider: Luciano Cutter, MD  Encounter Date: 06/18/2017      PT End of Session - 06/18/17 1146    Visit Number 19   Number of Visits 24   Date for PT Re-Evaluation 07/05/2017   Authorization Type G-codes every 10th visit, KX modifier after 15 visit   PT Start Time 1115   PT Stop Time 1155   PT Time Calculation (min) 40 min   Activity Tolerance Patient tolerated treatment well   Behavior During Therapy [facility] for tasks assessed/performed      Past Medical History:  Diagnosis Date  . Anemia   . Anxiety    claustrophobia  . Arthritis   . Complication of anesthesia    difficult to wake up after neck surgery  . COPD (chronic obstructive pulmonary disease) ([place])   . Dementia   . Depression   . Enlarged liver    20-25 years ago  . GERD (gastroesophageal reflux disease)   . Glaucoma   . Hemoptysis    a. 03/2016 in setting of PNA-->resolved.  . Hypertensive heart disease   . Mitral regurgitation    a. 09/2015 Echo: EF 55-60%, no rwma, mild to mod MR, PASP 75mHg.  . Paroxysmal atrial flutter ([place]    a. Noted 09/2015 following back surgery-->Eliquis (CHA2DS2VASc = 4);  b. 03/2016 Recurrent PAFlutter in setting of PNA-->converted spont.  . 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 Pneumonia 04/2015   "bronchial" pneumonia   . Retinal tear of left eye   . Sleep apnea    does use cpap  . Stroke ([facility] ? 05/2014   peripheral vision loss    Past Surgical History:  Procedure Laterality Date  . BACK SURGERY  09/27/2015   [place]  . CARDIAC CATHETERIZATION  2003  . COLONOSCOPY    . EYE SURGERY  10/2007  . EYE SURGERY Bilateral 2018   Eyelid  . LEG SURGERY     AT AGE 66 . LUMBAR FUSION   09/27/2015  . NECK SURGERY  06/29/2010  . SHOULDER SURGERY  09/24/2011  . TONSILLECTOMY     AT AGE 66 . VASECTOMY      There were no vitals filed for this visit.      Subjective Assessment - 06/18/17 1122    Subjective Patient reported doing well overall yet discomfort in back if laying down   Pertinent History a-fib, COPD, h/o previous back surgery   Limitations House hold activities;Writing;Walking;Standing;Lifting   How long can you sit comfortably? 15 minutes   How long can you stand comfortably? 30 minutes, with support   Diagnostic tests X-ray, MRI prior to surgery   Patient Stated Goals Walk with pain, Walk without cane, Be able to lift 50# without difficulty   Currently in Pain? No/denies                         [facility] Treatment/Exercise - 06/18/17 0001      Lumbar Exercises: Aerobic   Stationary Bike L6 x15 min     Lumbar Exercises: Standing   Shoulder Extension Strengthening;Both  2x20   Shoulder Extension Limitations pink XTS   Other Standing Lumbar Exercises Punches with Pink XTS  2x20 reps   Other Standing Lumbar Exercises Multifidus pushouts green theraband 2x20 reps B, wall pushups x20 reps     Lumbar Exercises: Seated   Sit to Stand 20 reps   Sit to Stand Limitations without UE support from low plinth     Knee/Hip Exercises: Standing   Hip Extension AROM;Both;2 sets;10 reps                  PT Short Term Goals - 04/10/17 1040      PT SHORT TERM GOAL #1   Title Ind with an initial HEP.   Time 4   Period Weeks   Status Achieved           PT Long Term Goals - 05/06/17 1130      PT LONG TERM GOAL #1   Title Patient sit 30 minutes with pain not > 3/10.     Time 8   Period Weeks   Status Achieved     PT LONG TERM GOAL #2   Title Patient will be able to stand 15 minutes with pain < 3/10.   Time 8   Period Weeks   Status Achieved     PT LONG TERM GOAL #3   Title Perform ADL's with pain less than 3/10.     Time 8   Period Weeks   Status Achieved     PT LONG TERM GOAL #4   Title Pt will be able to amb 500 feet with step through gait pattern with no device on community surface with pain < 3/10.    Time 8   Period Weeks   Status Achieved     PT LONG TERM GOAL #5   Title Pt will improve bilateral LE hip flexion and abduction to 5/5 in order to improve gait and functional mobility.    Time 8   Period Weeks   Status On-going               Plan - 06/18/17 1154    Clinical Impression Statement Patient tolerated treatment well today. 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 No pain today. Strength goal ongoing.   Rehab Potential Good   Clinical Impairments Affecting Rehab Potential COPD, h/o previous lumbar fusion   PT Frequency 2x / week   PT Duration 8 weeks   PT Treatment/Interventions ADLs/Self Care Home Management;Electrical Stimulation;Cryotherapy;Moist Heat;Ultrasound;Therapeutic exercise;Therapeutic activities;Functional mobility training;Gait training;Stair training;Patient/family education;Taping;Passive range of motion;Manual techniques   PT Next Visit Plan Continue with low back/LE strengthening/multifidus strengthening (NO SUPINE POSITION)   Consulted and Agree with Plan of Care Patient      Patient will benefit from skilled therapeutic intervention in order to improve the following deficits and impairments:  Abnormal gait, Postural dysfunction, Decreased range of motion, Difficulty walking, Decreased mobility, Pain, Decreased activity tolerance, Decreased strength  Visit Diagnosis: Difficulty in walking  Chronic lumbar radiculopathy  Joint stiffness of spine  Acute bilateral low back pain with left-sided sciatica  Debility     Problem List Patient Active Problem List   Diagnosis Date Noted  . GERD (gastroesophageal reflux disease) 02/17/2017  . OSA (obstructive  sleep apnea) 02/17/2017  . HCAP (healthcare-associated pneumonia) 02/17/2017  . Sepsis ([place]) 02/17/2017  . Spondylolisthesis at L5-S1 level 02/14/2017  . Hypertensive heart disease   . Paroxysmal atrial flutter ([place])   . Dementia without behavioral disturbance 06/04/2016  . Dementia with behavioral  disturbance 04/18/2016  . Atrial flutter ([place]) 01/11/2016  . Mitral valve regurgitation: mild to moderate 10/23/2015  . New onset atrial flutter ([place]) 10/04/2015  . Atrial fibrillation with rapid ventricular response ([place])   . Anemia 10/03/2015  . Stroke ([place])   . Spondylolisthesis of lumbar region 09/27/2015  . Lobar pneumonia due to unspecified organism 05/25/2015  . Snoring 03/16/2015  . Witnessed apneic spells 03/16/2015  . Obese 03/16/2015  . Morning headache 03/16/2015  . Excessive daytime sleepiness 03/16/2015  . COPD GOLD I 09/25/2014  . Upper airway cough syndrome vs cough variant asthma 08/22/2014  . Essential hypertension 08/22/2014  . Diplopia 06/25/2014  . Vision loss, bilateral 06/25/2014  . Weakness 06/25/2014  . Unspecified hereditary and idiopathic peripheral neuropathy 06/25/2014    Phillips Climes, PTA 06/18/2017, 11:58 AM  [facility] [place], [place], [address] Phone: [phone]   Fax:  [phone]  Name: AMEDIO BOWLBY MRN: [id number] Date of Birth: Oct 24, 1950  PHYSICAL THERAPY DISCHARGE SUMMARY  Visits from Start of Care: 19  Current functional level related to goals / functional outcomes: See above   Remaining deficits: Still progressing toward one of his LTG's set at initial evaluation   Education / Equipment: HEP Plan: Patient agrees to discharge.  Patient goals were partially met. Patient is being discharged due to being pleased with the current functional level.  ?????    Kearney Hard, PT 11/24/18 9:49 AM

## 2017-06-19 DIAGNOSIS — H2012 Chronic iridocyclitis, left eye: Secondary | ICD-10-CM | POA: Diagnosis not present

## 2017-06-19 DIAGNOSIS — H35372 Puckering of macula, left eye: Secondary | ICD-10-CM | POA: Diagnosis not present

## 2017-06-19 DIAGNOSIS — H35362 Drusen (degenerative) of macula, left eye: Secondary | ICD-10-CM | POA: Diagnosis not present

## 2017-06-19 DIAGNOSIS — H401131 Primary open-angle glaucoma, bilateral, mild stage: Secondary | ICD-10-CM | POA: Diagnosis not present

## 2017-06-19 DIAGNOSIS — H4040X0 Glaucoma secondary to eye inflammation, unspecified eye, stage unspecified: Secondary | ICD-10-CM | POA: Diagnosis not present

## 2017-06-23 ENCOUNTER — Encounter: Payer: Medicare Other | Admitting: Physical Therapy

## 2017-06-26 ENCOUNTER — Encounter: Payer: Medicare Other | Admitting: Physical Therapy

## 2017-06-30 ENCOUNTER — Encounter: Payer: Medicare Other | Admitting: Physical Therapy

## 2017-07-03 ENCOUNTER — Encounter: Payer: Medicare Other | Admitting: *Deleted

## 2017-07-07 ENCOUNTER — Encounter: Payer: Medicare Other | Admitting: Physical Therapy

## 2017-07-09 ENCOUNTER — Encounter: Payer: Medicare Other | Admitting: Physical Therapy

## 2017-07-16 DIAGNOSIS — G4733 Obstructive sleep apnea (adult) (pediatric): Secondary | ICD-10-CM | POA: Diagnosis not present

## 2017-07-16 DIAGNOSIS — H401132 Primary open-angle glaucoma, bilateral, moderate stage: Secondary | ICD-10-CM | POA: Diagnosis not present

## 2017-07-17 DIAGNOSIS — Z23 Encounter for immunization: Secondary | ICD-10-CM | POA: Diagnosis not present

## 2017-07-17 DIAGNOSIS — Z9989 Dependence on other enabling machines and devices: Secondary | ICD-10-CM | POA: Diagnosis not present

## 2017-07-17 DIAGNOSIS — J449 Chronic obstructive pulmonary disease, unspecified: Secondary | ICD-10-CM | POA: Diagnosis not present

## 2017-07-17 DIAGNOSIS — G4733 Obstructive sleep apnea (adult) (pediatric): Secondary | ICD-10-CM | POA: Diagnosis not present

## 2017-07-17 DIAGNOSIS — I10 Essential (primary) hypertension: Secondary | ICD-10-CM | POA: Diagnosis not present

## 2017-07-28 ENCOUNTER — Ambulatory Visit (INDEPENDENT_AMBULATORY_CARE_PROVIDER_SITE_OTHER): Payer: Medicare Other | Admitting: Cardiology

## 2017-07-28 ENCOUNTER — Encounter: Payer: Self-pay | Admitting: Cardiology

## 2017-07-28 VITALS — BP 94/44 | HR 52 | Ht 66.0 in | Wt 218.0 lb

## 2017-07-28 DIAGNOSIS — E7800 Pure hypercholesterolemia, unspecified: Secondary | ICD-10-CM

## 2017-07-28 DIAGNOSIS — M5136 Other intervertebral disc degeneration, lumbar region: Secondary | ICD-10-CM | POA: Diagnosis not present

## 2017-07-28 DIAGNOSIS — I480 Paroxysmal atrial fibrillation: Secondary | ICD-10-CM

## 2017-07-28 DIAGNOSIS — M5442 Lumbago with sciatica, left side: Secondary | ICD-10-CM | POA: Diagnosis not present

## 2017-07-28 DIAGNOSIS — Z6833 Body mass index (BMI) 33.0-33.9, adult: Secondary | ICD-10-CM | POA: Diagnosis not present

## 2017-07-28 DIAGNOSIS — I10 Essential (primary) hypertension: Secondary | ICD-10-CM

## 2017-07-28 MED ORDER — LOSARTAN POTASSIUM 50 MG PO TABS: 50.0000 mg | ORAL_TABLET | Freq: Every day | ORAL | 3 refills | Status: DC

## 2017-07-28 NOTE — Progress Notes (Signed)
Office Visit    Patient Name: Juan Johnson Date of Encounter: 07/28/2017  Primary Care Provider:  Curly Rim, MD Primary Cardiologist:  P. Martinique, MD   Chief Complaint    66 y/o ? with a h/o PAFlutter, HTN, HL, COPD, and prior CVA, who presents for f/u.  Past Medical History    Past Medical History:  Diagnosis Date  . Anemia   . Anxiety    claustrophobia  . Arthritis   . Complication of anesthesia    difficult to wake up after neck surgery  . COPD (chronic obstructive pulmonary disease) ([place])   . Dementia   . Depression   . Enlarged liver    20-25 years ago  . GERD (gastroesophageal reflux disease)   . Glaucoma   . Hemoptysis    a. 03/2016 in setting of PNA-->resolved.  . Hypertensive heart disease   . Mitral regurgitation    a. 09/2015 Echo: EF 55-60%, no rwma, mild to mod MR, PASP 57mmHg.  . Paroxysmal atrial flutter ([place])    a. Noted 09/2015 following back surgery-->Eliquis (CHA2DS2VASc = 4);  b. 03/2016 Recurrent PAFlutter in setting of PNA-->converted spont.  . 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 Pneumonia 04/2015   "bronchial" pneumonia   . Retinal tear of left eye   . Sleep apnea    does use cpap  . Stroke [facility]) ? 05/2014   peripheral vision loss   Past Surgical History:  Procedure Laterality Date  . BACK SURGERY  09/27/2015   [place]  . CARDIAC CATHETERIZATION  2003  . COLONOSCOPY    . EYE SURGERY  10/2007  . EYE SURGERY Bilateral 2018   Eyelid  . LEG SURGERY     AT AGE 38  . LUMBAR FUSION  09/27/2015  . NECK SURGERY  06/29/2010  . SHOULDER SURGERY  09/24/2011  . TONSILLECTOMY     AT AGE 42  . VASECTOMY      Allergies  Allergies  Allergen Reactions  . Ace Inhibitors Cough  . Crestor [Rosuvastatin] Other (See Comments)    MYALGIAS CRAMPS MUSCLE WEAKNESS  . Simvastatin Other (See Comments)    MYALGIAS CRAMPS MUSCLE WEAKNESS    History of Present Illness    66 year old male with a history of  paroxysmal atrial fibrillation/flutter, hypertension, COPD, prior stroke, sleep apnea, anxiety, depression, Mild to moderate mitral regurgitation, and hyperlipidemia.  He was seen twice in 2017 related to pneumonia and hemoptysis. In that setting, he also developed recurrent A. fib flutter. When he was seen back a week later, he was back in sinus rhythm.  His beta blocker was reduced due to bradycardia.  His last Echo in February 2018 showed only mild MR and normal LV function.   He underwent L5/S1 laminectomy in early May. He was readmitted one week later with HCAP. He had associated AFib with controlled rate. Treated with antibiotics. Had persistent cough for > one month. CT negative.   On follow up today cough has resolved. He denies any symptoms of Afib. He does feel wiped out in the mid day. No energy. Denies syncope. No SOB or chest pain.  On follow up today he states he is doing well. Does note some dyspnea which he relates to COPD and weight.  He tries to remain active and walks some. He has a "pinch" in his left hip that goes numb. He has not been having any chest pain,  PND, orthopnea,  dizziness, syncope, edema, palpitations, or early satiety. He checks his blood pressure at home and says that it has been stable. He did have recent plastic surgery on his right eyelid/brow to help with vision.  Home Medications    Prior to Admission medications   Medication Sig Start Date End Date Taking? Authorizing Provider  acetaminophen (TYLENOL) 500 MG tablet Take 500 mg by mouth every 4 (four) hours as needed for mild pain or moderate pain (Takes 2 every 4- 6 hours).    Yes Historical Provider, MD  albuterol (PROVENTIL HFA;VENTOLIN HFA) 108 (90 Base) MCG/ACT inhaler Inhale into the lungs every 6 (six) hours as needed for wheezing or shortness of breath.   Yes Historical Provider, MD  ALPRAZolam (XANAX) 0.5 MG tablet Take 1-2 30 minutes before MRi do not drive. May repeat if needed. 02/15/16  Yes Melvenia Beam, MD  apixaban (ELIQUIS) 5 MG TABS tablet Take 1 tablet (5 mg total) by mouth 2 (two) times daily. 01/11/16  Yes Laritza Vokes M Martinique, MD  BREO ELLIPTA 100-25 MCG/INH AEPB Take 1 puff by mouth daily. 02/13/16  Yes Historical Provider, MD  Cetirizine HCl (ZYRTEC PO) Take 10 mg by mouth daily.    Yes Historical Provider, MD  dextromethorphan-guaiFENesin (MUCINEX DM) 30-600 MG 12hr tablet Take 1 tablet by mouth 2 (two) times daily as needed for cough.    Yes Historical Provider, MD  diltiazem (CARDIZEM CD) 240 MG 24 hr capsule Take 1 capsule (240 mg total) by mouth daily. 01/30/16  Yes Inaara Tye M [place]ique, MD  donepezil (ARICEPT) 10 MG tablet Take 0.5 tablets by mouth daily. 05/29/16  Yes Historical Provider, MD  ezetimibe (ZETIA) 10 MG tablet Take 10 mg by mouth daily.   Yes Historical Provider, MD  ketorolac (ACULAR) 0.5 % ophthalmic solution Place 1 drop into the left eye 4 (four) times daily.  08/22/14  Yes Historical Provider, MD  latanoprost (XALATAN) 0.005 % ophthalmic solution Place 1 drop into both eyes at bedtime.    Yes Historical Provider, MD  metoprolol tartrate (LOPRESSOR) 25 MG tablet Take 0.5 tablets (12.5 mg total) by mouth 2 (two) times daily. 02/15/16  Yes Demone Lyles M Martinique, MD  montelukast (SINGULAIR) 10 MG tablet Take 10 mg by mouth at bedtime.  03/09/15  Yes Historical Provider, MD  Multiple Vitamins-Minerals (MULTIVITAMIN WITH MINERALS) tablet Take 1 tablet by mouth daily.   Yes Historical Provider, MD  niacin (NIASPAN) 1000 MG CR tablet Take 1,000 mg by mouth daily. 03/09/15  Yes Historical Provider, MD  omeprazole (PRILOSEC) 20 MG capsule Take 20 mg by mouth 2 (two) times daily before a meal.  03/09/15  Yes Historical Provider, MD  sertraline (ZOLOFT) 50 MG tablet Take 1 tablet (50 mg total) by mouth daily. 06/03/16  Yes Melvenia Beam, MD  valsartan-hydrochlorothiazide (DIOVAN-HCT) 160-12.5 MG tablet Take 1 tablet by mouth daily. 01/29/16  Yes Azhar Knope M Martinique, MD    Review of Systems    As  noted above.   All other systems reviewed and are otherwise negative except as noted above.  Physical Exam    VS:  BP (!) 94/44   Pulse (!) 52   Ht 5\' 6"  (1.676 m)   Wt 218 lb (98.9 kg)   BMI 35.19 kg/m  , BMI Body mass index is 35.19 kg/m. GENERAL:  Well appearing WM in NAD, obese HEENT:  PERRL, EOMI, sclera are clear. Oropharynx is clear. NECK:  No jugular venous distention, carotid upstroke brisk and symmetric, no  bruits, no thyromegaly or adenopathy LUNGS:  Clear to auscultation bilaterally CHEST:  Unremarkable HEART:  RRR,  PMI not displaced or sustained,S1 and S2 within normal limits, no S3, no S4: no clicks, no rubs, no murmurs ABD:  Soft, nontender. BS +, no masses or bruits. No hepatomegaly, no splenomegaly EXT:  2 + pulses throughout, no edema, no cyanosis no clubbing SKIN:  Warm and dry.  No rashes NEURO:  Alert and oriented x 3. Cranial nerves II through XII intact. PSYCH:  Cognitively intact    Accessory Clinical Findings    Lab Results  Component Value Date   WBC 12.8 (H) 02/19/2017   HGB 9.2 (L) 02/19/2017   HCT 29.1 (L) 02/19/2017   PLT 315 02/19/2017   GLUCOSE 113 (H) 02/19/2017   CHOL  05/27/2010    172        ATP III CLASSIFICATION:  <200     mg/dL   Desirable  200-239  mg/dL   Borderline High  >=240    mg/dL   High          TRIG 206 (H) 05/27/2010   HDL 34 (L) 05/27/2010   LDLCALC  05/27/2010    97        Total Cholesterol/HDL:CHD Risk Coronary Heart Disease Risk Table                     Men   Women  1/2 Average Risk   3.4   3.3  Average Risk       5.0   4.4  2 X Average Risk   9.6   7.1  3 X Average Risk  23.4   11.0        Use the calculated Patient Ratio above and the CHD Risk Table to determine the patient's CHD Risk.        ATP III CLASSIFICATION (LDL):  <100     mg/dL   Optimal  100-129  mg/dL   Near or Above                    Optimal  130-159  mg/dL   Borderline  160-189  mg/dL   High  >190     mg/dL   Very High   ALT 15 (L)  02/16/2017   AST 24 02/16/2017   NA 135 02/19/2017   K 3.6 02/19/2017   CL 105 02/19/2017   CREATININE 0.90 02/19/2017   BUN 8 02/19/2017   CO2 22 02/19/2017   TSH 0.651 10/03/2015   INR 1.18 02/16/2017   HGBA1C 6.0 (H) 06/24/2014   Labs dated 09/12/16: glucose 103, UA normal. Hgb 12.0. Normal CMET. Hep C antibody neg. Cholesterol 200, triglycerides 131, HDL 42, LDL 132.    Echo 12/05/16:Study Conclusions  - Left ventricle: The cavity size was mildly dilated. Wall   thickness was normal. Systolic function was normal. The estimated   ejection fraction was in the range of 50% to 55%. Wall motion was   normal; there were no regional wall motion abnormalities. Left   ventricular diastolic function parameters were normal. - Mitral valve: There was mild regurgitation. - Left atrium: The atrium was mildly dilated. - Pulmonary arteries: Systolic pressure was mildly increased. PA   peak pressure: 32 mm Hg (S).  Impressions:  - Normal LV systolic and diastolic function; mild LVE; mild MR;   mild LAE; trace TR; mildly elevated pulmonary pressure.   Assessment & Plan    1.  Paroxysmal atrial fibrillation/flutter: Triggered by fever and PNA.  He remains in sinus rhythm today. Will continue rate control strategy with metoprolol and cardizem.  Continue Eliquis for anticoagulation. Follow up in 6 months.  2. Hypertensive heart disease: Blood pressure is low today and I think may be contributing to his mid day fatigue. Will switch losartan HCT 100/12/5 to plain losartan 50 mg daily and observe.   3. Hyperlipidemia: He is treated with Zetia and niacin.  Lipids have been managed by primary care and he is scheduled for repeat lab work in December.  4. Mild  mitral regurgitation by Echo. Asymptomatic.  5. Disposition: follow up in 6 months  Callyn Severtson Martinique, [place] 07/28/2017, 1:36 PM

## 2017-07-28 NOTE — Patient Instructions (Addendum)
Stop taking losartan HCT  We will lswitch to losartan only 50 mg daily  Continue your other therapy  I will see you in 6 months

## 2017-08-08 ENCOUNTER — Ambulatory Visit: Payer: Medicare Other | Admitting: Cardiology

## 2017-08-12 ENCOUNTER — Ambulatory Visit (INDEPENDENT_AMBULATORY_CARE_PROVIDER_SITE_OTHER): Payer: Medicare Other | Admitting: Neurology

## 2017-08-12 ENCOUNTER — Telehealth: Payer: Self-pay | Admitting: *Deleted

## 2017-08-12 ENCOUNTER — Encounter: Payer: Self-pay | Admitting: Neurology

## 2017-08-12 VITALS — BP 144/71 | HR 50 | Ht 69.0 in | Wt 224.0 lb

## 2017-08-12 DIAGNOSIS — F0280 Dementia in other diseases classified elsewhere without behavioral disturbance: Secondary | ICD-10-CM | POA: Diagnosis not present

## 2017-08-12 DIAGNOSIS — F02818 Dementia in other diseases classified elsewhere, unspecified severity, with other behavioral disturbance: Secondary | ICD-10-CM

## 2017-08-12 DIAGNOSIS — G300 Alzheimer's disease with early onset: Secondary | ICD-10-CM | POA: Diagnosis not present

## 2017-08-12 DIAGNOSIS — F0281 Dementia in other diseases classified elsewhere with behavioral disturbance: Secondary | ICD-10-CM

## 2017-08-12 MED ORDER — SERTRALINE HCL 100 MG PO TABS: 150.0000 mg | ORAL_TABLET | Freq: Every day | ORAL | 4 refills | Status: DC

## 2017-08-12 MED ORDER — MEMANTINE HCL 10 MG PO TABS: 10.0000 mg | ORAL_TABLET | Freq: Two times a day (BID) | ORAL | 4 refills | Status: DC

## 2017-08-12 MED ORDER — DONEPEZIL HCL 5 MG PO TABS: 5.0000 mg | ORAL_TABLET | Freq: Every day | ORAL | 4 refills | Status: DC

## 2017-08-12 NOTE — Patient Instructions (Addendum)
Sertraline tablets What is this medicine? SERTRALINE (SER tra leen) is used to treat depression. It may also be used to treat obsessive compulsive disorder, panic disorder, post-trauma stress, premenstrual dysphoric disorder (PMDD) or social anxiety. This medicine may be used for other purposes; ask your health care provider or pharmacist if you have questions. COMMON BRAND NAME(S): Zoloft What should I tell my health care provider before I take this medicine? They need to know if you have any of these conditions: -bleeding disorders -bipolar disorder or a family history of bipolar disorder -glaucoma -heart disease -high blood pressure -history of irregular heartbeat -history of low levels of calcium, magnesium, or potassium in the blood -if you often drink alcohol -liver disease -receiving electroconvulsive therapy -seizures -suicidal thoughts, plans, or attempt; a previous suicide attempt by you or a family member -take medicines that treat or prevent blood clots -thyroid disease -an unusual or allergic reaction to sertraline, other medicines, foods, dyes, or preservatives -pregnant or trying to get pregnant -breast-feeding How should I use this medicine? Take this medicine by mouth with a glass of water. Follow the directions on the prescription label. You can take it with or without food. Take your medicine at regular intervals. Do not take your medicine more often than directed. Do not stop taking this medicine suddenly except upon the advice of your doctor. Stopping this medicine too quickly may cause serious side effects or your condition may worsen. A special MedGuide will be given to you by the pharmacist with each prescription and refill. Be sure to read this information carefully each time. Talk to your pediatrician regarding the use of this medicine in children. While this drug may be prescribed for children as young as 7 years for selected conditions, precautions do  apply. Overdosage: If you think you have taken too much of this medicine contact a poison control center or emergency room at once. NOTE: This medicine is only for you. Do not share this medicine with others. What if I miss a dose? If you miss a dose, take it as soon as you can. If it is almost time for your next dose, take only that dose. Do not take double or extra doses. What may interact with this medicine? Do not take this medicine with any of the following medications: -cisapride -dofetilide -dronedarone -linezolid -MAOIs like Carbex, Eldepryl, Marplan, Nardil, and Parnate -methylene blue (injected into a vein) -pimozide -thioridazine This medicine may also interact with the following medications: -alcohol -amphetamines -aspirin and aspirin-like medicines -certain medicines for depression, anxiety, or psychotic disturbances -certain medicines for fungal infections like ketoconazole, fluconazole, posaconazole, and itraconazole -certain medicines for irregular heart beat like flecainide, quinidine, propafenone -certain medicines for migraine headaches like almotriptan, eletriptan, frovatriptan, naratriptan, rizatriptan, sumatriptan, zolmitriptan -certain medicines for sleep -certain medicines for seizures like carbamazepine, valproic acid, phenytoin -certain medicines that treat or prevent blood clots like warfarin, enoxaparin, dalteparin -cimetidine -digoxin -diuretics -fentanyl -isoniazid -lithium -NSAIDs, medicines for pain and inflammation, like ibuprofen or naproxen -other medicines that prolong the QT interval (cause an abnormal heart rhythm) -rasagiline -safinamide -supplements like St. John's wort, kava kava, valerian -tolbutamide -tramadol -tryptophan This list may not describe all possible interactions. Give your health care provider a list of all the medicines, herbs, non-prescription drugs, or dietary supplements you use. Also tell them if you smoke, drink  alcohol, or use illegal drugs. Some items may interact with your medicine. What should I watch for while using this medicine? Tell your doctor if your symptoms   do not get better or if they get worse. Visit your doctor or health care professional for regular checks on your progress. Because it may take several weeks to see the full effects of this medicine, it is important to continue your treatment as prescribed by your doctor. Patients and their families should watch out for new or worsening thoughts of suicide or depression. Also watch out for sudden changes in feelings such as feeling anxious, agitated, panicky, irritable, hostile, aggressive, impulsive, severely restless, overly excited and hyperactive, or not being able to sleep. If this happens, especially at the beginning of treatment or after a change in dose, call your health care professional. Dennis Bast may get drowsy or dizzy. Do not drive, use machinery, or do anything that needs mental alertness until you know how this medicine affects you. Do not stand or sit up quickly, especially if you are an older patient. This reduces the risk of dizzy or fainting spells. Alcohol may interfere with the effect of this medicine. Avoid alcoholic drinks. Your mouth may get dry. Chewing sugarless gum or sucking hard candy, and drinking plenty of water may help. Contact your doctor if the problem does not go away or is severe. What side effects may I notice from receiving this medicine? Side effects that you should report to your doctor or health care professional as soon as possible: -allergic reactions like skin rash, itching or hives, swelling of the face, lips, or tongue -anxious -black, tarry stools -changes in vision -confusion -elevated mood, decreased need for sleep, racing thoughts, impulsive behavior -eye pain -fast, irregular heartbeat -feeling faint or lightheaded, falls -feeling agitated, angry, or irritable -hallucination, loss of contact with  reality -loss of balance or coordination -loss of memory -painful or prolonged erections -restlessness, pacing, inability to keep still -seizures -stiff muscles -suicidal thoughts or other mood changes -trouble sleeping -unusual bleeding or bruising -unusually weak or tired -vomiting Side effects that usually do not require medical attention (report to your doctor or health care professional if they continue or are bothersome): -change in appetite or weight -change in sex drive or performance -diarrhea -increased sweating -indigestion, nausea -tremors This list may not describe all possible side effects. Call your doctor for medical advice about side effects. You may report side effects to FDA at 1-800-FDA-1088. Where should I keep my medicine? Keep out of the reach of children. Store at room temperature between 15 and 30 degrees C (59 and 86 degrees F). Throw away any unused medicine after the expiration date. NOTE: This sheet is a summary. It may not cover all possible information. If you have questions about this medicine, talk to your doctor, pharmacist, or health care provider.  2018 Elsevier/Gold Standard (2016-10-04 14:17:49)    Alzheimer Disease Alzheimer disease is a brain disease that affects memory, thinking, and behavior. People with Alzheimer disease lose mental abilities, and the disease gets worse over time. Survival with Alzheimer disease ranges from several years to as long as 20 years. What are the causes? This condition develops when a protein called beta-amyloid forms deposits in the brain. It is not known what causes these deposits to form. What increases the risk? This condition is more likely to develop in people who:  Are elderly.  Have a family history of dementia.  Have had a brain injury.  Have heart or blood vessel disease.  Have had a stroke.  Have high blood pressure or high cholesterol.  Have diabetes.  What are the signs or  symptoms? Symptoms  of this condition happen in three stages, which often overlap. Early stage In this stage, you may continue to be independent. You may still be able to drive, work, and be social. Symptoms in this stage include:  Minor memory problems, such as forgetting a name or what you read.  Difficulty with: ? Paying attention. ? Communicating. ? Doing familiar tasks. ? Learning new things.  Needing more time to do daily activities.  Anxiety.  Social withdrawal.  Loss of motivation.  Moderate stage In this stage, you will start to need care. This stage usually lasts the longest. Symptoms in this stage include:  Difficulty with expressing thoughts.  Memory loss that affects daily life. This can include forgetting: ? Your address or phone number. ? Events that have happened. ? Parts of your personal history, like where you went to school.  Confusion about where you are or what time it is.  Difficulty in judging distance.  Changes in personality, mood, and behavior. You may be moody, irritable, angry, frustrated, fearful, anxious, or suspicious.  Poor reasoning and judgment.  Delusions or hallucinations.  Changes in sleep patterns.  Wandering and getting lost.  Severe stage In the final stage, you will need help with your personal care and dailyactivities. Symptoms in this stage include:  Worsening memory loss.  Personality changes.  Loss of awareness of your surroundings.  Changes in physical abilities, including the ability to walk, sit, and swallow.  Difficulty in communicating.  Inability to control the bladder and bowels.  Increasing confusion.  Increasing disruptive behavior.  How is this diagnosed? This condition is diagnosed with an assessment by your health care provider. During this assessment, your health care provider will talk with you and your family, friends, or caregivers about your symptoms. A thorough medical history will be  taken, and you will have a physical exam and tests. Tests may include:  Lab tests, such as blood or urine tests.  Imaging tests, such as a CT scan, PET scan, or MRI.  A lumbar puncture. This test involves removing and testing a small amount of the fluid that surrounds the brain and spinal cord.  An electroencephalogram (EEG). In this test, small metal discs are used to measure electrical activity in the brain.  Memory tests, cognitive tests, and neuropsychological tests. These tests evaluate brain function.  How is this treated? At this time, there is no treatment to cure Alzheimer disease or stop it from getting worse. The goals of treatment are:  To slow down the disease.  To manage behavioral problems.  To provide you with a safe environment.  To make life easier for you and your caregivers.  The following treatment options are available:  Medicines. Medicines may help to slow down memory loss and control behavioral symptoms.  Talk therapy. Talk therapy provides you with education, support, and memory aids. It is most helpful in the early stages of the condition.  Counseling or spiritual guidance. It is normal to have a lot of feelings, including anger, relief, fear, and isolation. Counseling and guidance can help you deal with these feelings.  Caregiving. This involves having caregivers help you with your daily activities. Caregivers may be family members, friends, or trained medical professionals. Caregiving can be done at home or outside the home.  Family support groups. These provide education, emotional support, and information about community resources to family members who are taking care of you.  Follow these instructions at home: Medicines  Take over-the-counter and prescription medicines only as  told by your health care provider.  Avoid taking medicines that can affect thinking, such as pain or sleeping medicines. Lifestyle   Make healthy lifestyle  choices: ? Be physically active as told by your health care provider. ? Do not use any tobacco products, such as cigarettes, chewing tobacco, and e-cigarettes. If you need help quitting, ask your health care provider. ? Eat a healthy diet. ? Practice stress-management techniques when you get stressed. ? Stay social.  Drink enough fluid to keep your urine clear or pale yellow.  Make sure to get quality sleep. These tips can help you get a good night's rest: ? Avoid napping during the day. ? Keep your sleeping area dark and cool. ? Avoid exercising during the few hours before you go to bed. ? Avoid caffeine products in the evening. General instructions  Work with your health care provider to determine what you need help with and what your safety needs are.  If you were given a bracelet that tracks your location, make sure to wear it.  Keep all follow-up visits as told by your health care provider. This is important.  If you have questions or would like additional support, you may contact The Alzheimer's Association: ? 24-hour helpline: 240-265-4687 ? Website: [employer] Contact a health care provider if:  You have nausea, vomiting, or trouble with eating.  You have dizziness, or weakness.  You have new or worsening trouble with sleeping.  You or your family members become concerned for your safety. Get help right away if:  You develop chest pain or difficulty with breathing.  You pass out. This information is not intended to replace advice given to you by your health care provider. Make sure you discuss any questions you have with your health care provider. Document Released: 06/11/2004 Document Revised: 05/31/2016 Document Reviewed: 06/28/2015 Elsevier Interactive Patient Education  2017 [employer].
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
## 2017-08-12 NOTE — Addendum Note (Signed)
Addended by: Sarina Ill B on: 08/12/2017 01:13 PM   Modules accepted: Orders

## 2017-08-14 ENCOUNTER — Encounter: Payer: Self-pay | Admitting: Psychology

## 2017-08-19 NOTE — Telephone Encounter (Signed)
error 

## 2017-09-15 DIAGNOSIS — N433 Hydrocele, unspecified: Secondary | ICD-10-CM | POA: Diagnosis not present

## 2017-09-15 DIAGNOSIS — Z0000 Encounter for general adult medical examination without abnormal findings: Secondary | ICD-10-CM | POA: Diagnosis not present

## 2017-09-15 DIAGNOSIS — E7849 Other hyperlipidemia: Secondary | ICD-10-CM | POA: Diagnosis not present

## 2017-09-15 DIAGNOSIS — J439 Emphysema, unspecified: Secondary | ICD-10-CM | POA: Diagnosis not present

## 2017-09-15 DIAGNOSIS — K219 Gastro-esophageal reflux disease without esophagitis: Secondary | ICD-10-CM | POA: Diagnosis not present

## 2017-09-15 DIAGNOSIS — I10 Essential (primary) hypertension: Secondary | ICD-10-CM | POA: Diagnosis not present

## 2017-09-15 DIAGNOSIS — I861 Scrotal varices: Secondary | ICD-10-CM | POA: Diagnosis not present

## 2017-09-15 DIAGNOSIS — R3911 Hesitancy of micturition: Secondary | ICD-10-CM | POA: Diagnosis not present

## 2017-10-01 DIAGNOSIS — Z6834 Body mass index (BMI) 34.0-34.9, adult: Secondary | ICD-10-CM | POA: Diagnosis not present

## 2017-10-01 DIAGNOSIS — S3992XA Unspecified injury of lower back, initial encounter: Secondary | ICD-10-CM | POA: Diagnosis not present

## 2017-10-27 ENCOUNTER — Encounter: Payer: Medicare Other | Admitting: Psychology

## 2017-11-03 ENCOUNTER — Ambulatory Visit (INDEPENDENT_AMBULATORY_CARE_PROVIDER_SITE_OTHER): Payer: Medicare Other | Admitting: Psychology

## 2017-11-03 ENCOUNTER — Encounter: Payer: Self-pay | Admitting: Psychology

## 2017-11-03 DIAGNOSIS — F0391 Unspecified dementia with behavioral disturbance: Secondary | ICD-10-CM
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
## 2017-11-05 ENCOUNTER — Ambulatory Visit (INDEPENDENT_AMBULATORY_CARE_PROVIDER_SITE_OTHER): Payer: Medicare Other | Admitting: Psychology

## 2017-11-05 DIAGNOSIS — F0391 Unspecified dementia with behavioral disturbance: Secondary | ICD-10-CM

## 2017-11-05 NOTE — Progress Notes (Signed)
   Neuropsychology Note  Juan Johnson completed 120 minutes of neuropsychological testing with technician, Milana Kidney, BS, under the supervision of Dr. Macarthur Critchley, Licensed Psychologist. The patient did not appear overtly distressed by the testing session, per behavioral observation or via self-report to the technician. Rest breaks were offered.   Clinical Decision Making: In considering the patient's current level of functioning, level of presumed impairment, nature of symptoms, emotional and behavioral responses during the interview, level of literacy, and observed level of motivation/effort, a battery of tests was selected and communicated to the psychometrician.  Communication between the psychologist and technician was ongoing throughout the testing session and changes were made as deemed necessary based on patient performance on testing, technician observations and additional pertinent factors such as those listed above.  Juan Johnson will return within approximately 2 weeks for an interactive feedback session with Dr. Si Raider at which time his test performances, clinical impressions and treatment recommendations will be reviewed in detail. The patient understands he can contact our office should he require our assistance before this time.  20 minutes spent performing neuropsychological evaluation services/clinical decision making (psychologist). [CPT 54656] 120 minutes spent face-to-face with patient administering standardized tests, 30 minutes spent scoring (technician). [CPT [id number], 81275]  Full report to follow.

## 2017-11-06 ENCOUNTER — Encounter: Payer: Self-pay | Admitting: Psychology
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
## 2017-11-13 ENCOUNTER — Encounter: Payer: Self-pay | Admitting: Psychology

## 2017-11-13 ENCOUNTER — Ambulatory Visit (INDEPENDENT_AMBULATORY_CARE_PROVIDER_SITE_OTHER): Payer: Medicare Other | Admitting: Psychology

## 2017-11-13 DIAGNOSIS — F0391 Unspecified dementia with behavioral disturbance: Secondary | ICD-10-CM | POA: Diagnosis not present

## 2017-11-17 ENCOUNTER — Encounter: Payer: Medicare Other | Admitting: Psychology

## 2017-12-18 ENCOUNTER — Other Ambulatory Visit: Payer: Self-pay | Admitting: Gastroenterology

## 2017-12-20 ENCOUNTER — Other Ambulatory Visit: Payer: Self-pay | Admitting: Cardiology

## 2017-12-22 ENCOUNTER — Telehealth: Payer: Self-pay | Admitting: *Deleted

## 2017-12-22 NOTE — Telephone Encounter (Signed)
   [facility] Pre-operative Risk Assessment    Request for surgical clearance:  1. What type of surgery is being performed? Colonoscopy   2. When is this surgery scheduled? 01/20/2018   3. What type of clearance is required (medical clearance vs. Pharmacy clearance to hold med vs. Both)? Both  4. Are there any medications that need to be held prior to surgery and how long? Eliquis   5. Practice name and name of physician performing surgery? [facility], Dr.Mann   6. What is your office phone and fax number? Fax: [phone], Phone [phone]  7. Anesthesia type (None, local, MAC, general) ? None listed   Juan Johnson 12/22/2017, 5:58 PM  _________________________________________________________________   (provider comments below)

## 2017-12-22 NOTE — Telephone Encounter (Signed)
REFILL 

## 2017-12-24 NOTE — Telephone Encounter (Signed)
   Primary Cardiologist: Peter Martinique, MD  Chart reviewed as part of pre-operative protocol coverage. Patient was contacted 12/24/2017 in reference to pre-operative risk assessment for pending surgery as outlined below.  Juan Johnson was last seen on 07/28/17 by Dr. Martinique.  Since that day, Juan Johnson has done.   Therefore, based on ACC/AHA guidelines, the patient would be at acceptable risk for the planned procedure without further cardiovascular testing.   Will route pharmacy to review anticoagulation.   [place], [place] 12/24/2017, 4:28 PM

## 2017-12-24 NOTE — Telephone Encounter (Signed)
Patient with diagnosis of atrial fibrillation on Eliquis for anticoagulation.    Procedure: colonoscopy Date of procedure: 01/20/18  CHADS2-VASc score of  3 (HTN, AGE, stroke/tia x 2)  CrCl 112.9 Platelet count 315  Per office protocol, patient can hold Eliquis for 1 days prior to procedure.    Note patient has history of stroke in 2015  Patient should restart Eliquis on the evening of procedure or day after, at discretion of procedure MD

## 2017-12-25 NOTE — Telephone Encounter (Signed)
Clearance and pharmacy recs routed to requesting MD at Fax # listed below. Will remove from preop pool.

## 2018-01-05 ENCOUNTER — Encounter: Payer: Medicare Other | Admitting: Psychology

## 2018-01-09 ENCOUNTER — Other Ambulatory Visit: Payer: Self-pay | Admitting: Cardiology

## 2018-01-12 ENCOUNTER — Other Ambulatory Visit: Payer: Self-pay

## 2018-01-12 ENCOUNTER — Encounter (HOSPITAL_COMMUNITY): Payer: Self-pay | Admitting: *Deleted

## 2018-01-21 NOTE — Anesthesia Preprocedure Evaluation (Addendum)
Anesthesia Evaluation  Patient identified by MRN, date of birth, ID band Patient awake    Reviewed: Allergy & Precautions, H&P , Patient's Chart, lab work & pertinent test results, reviewed documented beta blocker date and time   History of Anesthesia Complications (+) history of anesthetic complications  Airway Mallampati: II  TM Distance: >3 FB Neck ROM: full    Dental no notable dental hx. (+) Dental Advisory Given   Pulmonary sleep apnea , COPD, former smoker,    Pulmonary exam normal breath sounds clear to auscultation       Cardiovascular hypertension,  Rhythm:regular Rate:Normal     Neuro/Psych Dementia    GI/Hepatic   Endo/Other    Renal/GU      Musculoskeletal   Abdominal   Peds  Hematology   Anesthesia Other Findings   Reproductive/Obstetrics                           Anesthesia Physical Anesthesia Plan  ASA: III  Anesthesia Plan: MAC   Post-op Pain Management:    Induction: Intravenous  PONV Risk Score and Plan: Treatment may vary due to age or medical condition  Airway Management Planned: Mask and Natural Airway  Additional Equipment:   Intra-op Plan:   Post-operative Plan:   Informed Consent: I have reviewed the patients History and Physical, chart, labs and discussed the procedure including the risks, benefits and alternatives for the proposed anesthesia with the patient or authorized representative who has indicated his/her understanding and acceptance.   Dental Advisory Given  Plan Discussed with: CRNA and Surgeon  Anesthesia Plan Comments:        Anesthesia Quick Evaluation

## 2018-01-22 ENCOUNTER — Encounter (HOSPITAL_COMMUNITY): Payer: Self-pay | Admitting: Certified Registered"

## 2018-01-22 ENCOUNTER — Ambulatory Visit (HOSPITAL_COMMUNITY)
Admission: RE | Admit: 2018-01-22 | Discharge: 2018-01-22 | Disposition: A | Discharge status: Home / Self Care | Payer: Medicare Other | Source: Ambulatory Visit | Attending: Gastroenterology | Admitting: Gastroenterology

## 2018-01-22 ENCOUNTER — Ambulatory Visit (HOSPITAL_COMMUNITY): Payer: Medicare Other | Admitting: Anesthesiology

## 2018-01-22 ENCOUNTER — Encounter (HOSPITAL_COMMUNITY)
Admission: RE | Disposition: A | Discharge status: Home / Self Care | Payer: Self-pay | Source: Ambulatory Visit | Attending: Gastroenterology

## 2018-01-22 ENCOUNTER — Other Ambulatory Visit: Payer: Self-pay

## 2018-01-22 DIAGNOSIS — Z7982 Long term (current) use of aspirin: Secondary | ICD-10-CM | POA: Insufficient documentation

## 2018-01-22 DIAGNOSIS — K2970 Gastritis, unspecified, without bleeding: Secondary | ICD-10-CM | POA: Insufficient documentation

## 2018-01-22 DIAGNOSIS — M1990 Unspecified osteoarthritis, unspecified site: Secondary | ICD-10-CM | POA: Insufficient documentation

## 2018-01-22 DIAGNOSIS — Z1211 Encounter for screening for malignant neoplasm of colon: Secondary | ICD-10-CM | POA: Diagnosis not present

## 2018-01-22 DIAGNOSIS — Z79899 Other long term (current) drug therapy: Secondary | ICD-10-CM | POA: Insufficient documentation

## 2018-01-22 DIAGNOSIS — Z8673 Personal history of transient ischemic attack (TIA), and cerebral infarction without residual deficits: Secondary | ICD-10-CM | POA: Diagnosis not present

## 2018-01-22 DIAGNOSIS — I10 Essential (primary) hypertension: Secondary | ICD-10-CM | POA: Insufficient documentation

## 2018-01-22 DIAGNOSIS — K5730 Diverticulosis of large intestine without perforation or abscess without bleeding: Secondary | ICD-10-CM | POA: Insufficient documentation

## 2018-01-22 DIAGNOSIS — D122 Benign neoplasm of ascending colon: Secondary | ICD-10-CM | POA: Insufficient documentation

## 2018-01-22 DIAGNOSIS — Z87891 Personal history of nicotine dependence: Secondary | ICD-10-CM | POA: Insufficient documentation

## 2018-01-22 DIAGNOSIS — K621 Rectal polyp: Secondary | ICD-10-CM | POA: Insufficient documentation

## 2018-01-22 DIAGNOSIS — K921 Melena: Secondary | ICD-10-CM | POA: Insufficient documentation

## 2018-01-22 DIAGNOSIS — D509 Iron deficiency anemia, unspecified: Secondary | ICD-10-CM | POA: Insufficient documentation

## 2018-01-22 DIAGNOSIS — D124 Benign neoplasm of descending colon: Secondary | ICD-10-CM | POA: Insufficient documentation

## 2018-01-22 DIAGNOSIS — F419 Anxiety disorder, unspecified: Secondary | ICD-10-CM | POA: Diagnosis not present

## 2018-01-22 DIAGNOSIS — Z7901 Long term (current) use of anticoagulants: Secondary | ICD-10-CM | POA: Diagnosis not present

## 2018-01-22 DIAGNOSIS — Z888 Allergy status to other drugs, medicaments and biological substances status: Secondary | ICD-10-CM | POA: Insufficient documentation

## 2018-01-22 DIAGNOSIS — I4892 Unspecified atrial flutter: Secondary | ICD-10-CM | POA: Insufficient documentation

## 2018-01-22 DIAGNOSIS — K219 Gastro-esophageal reflux disease without esophagitis: Secondary | ICD-10-CM | POA: Insufficient documentation

## 2018-01-22 DIAGNOSIS — G4730 Sleep apnea, unspecified: Secondary | ICD-10-CM | POA: Diagnosis not present

## 2018-01-22 DIAGNOSIS — J449 Chronic obstructive pulmonary disease, unspecified: Secondary | ICD-10-CM | POA: Diagnosis not present

## 2018-01-22 DIAGNOSIS — Z7951 Long term (current) use of inhaled steroids: Secondary | ICD-10-CM | POA: Diagnosis not present

## 2018-01-22 HISTORY — PX: ESOPHAGOGASTRODUODENOSCOPY (EGD) WITH PROPOFOL: SHX5813

## 2018-01-22 HISTORY — PX: COLONOSCOPY WITH PROPOFOL: SHX5780

## 2018-01-22 SURGERY — ESOPHAGOGASTRODUODENOSCOPY (EGD) WITH PROPOFOL
Anesthesia: Monitor Anesthesia Care

## 2018-01-22 MED ORDER — LIDOCAINE 2% (20 MG/ML) 5 ML SYRINGE
INTRAMUSCULAR | Status: DC | PRN
  Administered 2018-01-22: 60 mg via INTRAVENOUS

## 2018-01-22 MED ORDER — LACTATED RINGERS IV SOLN
INTRAVENOUS | Status: DC
  Administered 2018-01-22: 07:00:00 via INTRAVENOUS

## 2018-01-22 MED ORDER — PROPOFOL 10 MG/ML IV BOLUS
INTRAVENOUS | Status: DC | PRN
  Administered 2018-01-22 (×8): 50 mg via INTRAVENOUS

## 2018-01-22 MED ORDER — PROPOFOL 10 MG/ML IV BOLUS
INTRAVENOUS | Status: AC
  Filled 2018-01-22: qty 40

## 2018-01-22 MED ORDER — SODIUM CHLORIDE 0.9 % IV SOLN: INTRAVENOUS | Status: DC

## 2018-01-22 SURGICAL SUPPLY — 25 items

## 2018-01-22 NOTE — Op Note (Signed)
[facility] Patient Name: Juan Johnson Procedure Date: 01/22/2018 MRN: [id number] Attending MD: Juanita Craver , MD Date of Birth: [birth year] CSN: [id number] Age: 67 Admit Type: Inpatient Procedure:                Colonoscopy with cold biopsiess & hot snare                            polypectomy x 2. Indications:              Iron deficiency anemia, Blood in stool, CRC                            screening. Providers:                Juanita Craver, MD, Elmer Ramp. Hinson, RN, [employer], Technician, [employer], Immunologist. Referring MD:             Bradly Bienenstock, NP, Peter M. Martinique, MD Medicines:                Monitored Anesthesia Care Complications:            No immediate complications. Estimated Blood Loss:     Estimated blood loss was minimal. Procedure:                Pre-anesthesia assessment: - Prior to the                            procedure, a history and physical was performed,                            and patient medications and allergies were                            reviewed. The patient's tolerance of previous                            anesthesia was also reviewed. The risks and                            benefits of the procedure and the sedation options                            and risks were discussed with the patient. All                            questions were answered, and informed consent was                            obtained. Prior Anticoagulants: The patient has                            taken Eliquis (apixaban), last dose was 1 day prior  to procedure. ASA Grade Assessment: IV - A patient                            with severe systemic disease that is a constant                            threat to life. After reviewing the risks and                            benefits, the patient was deemed in satisfactory                            condition to undergo the procedure. After obtaining                            informed consent, the colonoscope was passed under                            direct vision. Throughout the procedure, the                            patient's blood pressure, pulse, and oxygen                            saturations were monitored continuously. The                            [id number] ([id number]) scope was introduced through                            the anus and advanced to the the cecum, identified                            by appendiceal orifice and ileocecal valve. The                            colonoscopy was performed without difficulty. The                            patient tolerated the procedure well. The quality                            of the bowel preparation was adequate. The                            ileocecal valve, the appendiceal orifice and the                            rectum were photographed. The bowel preparation                            used was GoLYTELY. Scope In: 7:39:46 AM Scope Out: 8:07:57 AM Scope Withdrawal Time: 0 hours 23 minutes 34 seconds  Total Procedure  Duration: 0 hours 28 minutes 11 seconds  Findings:      Multiple small and large-mouthed diverticula were found in the sigmoid       colon.      Three small sessile polyp were found, 1 in the rectum, 1 in the       descending colon and one in the proximal right colon-these were removed       by cold biopsies.      Two 7 mm sessile polyps were found, 1 in the mid-descending colon and 1       proximal ascending colon; these polyps were removed with a hot snare x       2-200/20; resection and retrieval were complete.      Small internal hemorrhoids were noted on retroflexion. Impression:               - Diverticulosis in the sigmoid colon.                           - Three small sessile polyps removed by cold                            biopsies from the rectum, descending colon and                            proximal right colon respectively                            - Two 7 mm sessile polyps, 1 in the mid-descending                            colon and 1 in the proximal ascending colon,                            removed with a hot snare x 2; resected and                            retrieved.                           - Small internal hemorrhoids. Moderate Sedation:      MAC used. Recommendation:           - High fiber diet with augmented water consumption                            daily.                           - Continue present medications; hold Eliquis foe 3                            days.                           - Await pathology results.                           - Repeat colonoscopy in  5 years for surveillance.                           - Return to GI office in 4 weeks.                           - If the patient has any abnormal GI symptoms in                            the interim, he has been advised to call the office                            ASAP for further recommendations. Procedure Code(s):        --- Professional ---                           [phone], Colonoscopy, flexible; with removal of                            tumor(s), polyp(s), or other lesion(s) by snare                            technique Diagnosis Code(s):        --- Professional ---                           D50.9, Iron deficiency anemia, unspecified                           K92.1, Melena (includes Hematochezia)                           Z12.11, Encounter for screening for malignant                            neoplasm of colon                           K57.30, Diverticulosis of large intestine without                            perforation or abscess without bleeding                           D12.2, Benign neoplasm of ascending colon                           D12.4, Benign neoplasm of descending colon CPT copyright 2017 American Medical Association. All rights reserved. The codes documented in this report are preliminary and upon coder review may  be  revised to meet current compliance requirements. Juanita Craver, MD Juanita Craver, MD 01/22/2018 8:41:38 AM This report has been signed electronically. Number of Addenda: 0

## 2018-01-22 NOTE — Discharge Instructions (Signed)

## 2018-01-22 NOTE — H&P (Signed)
Juan Johnson is an 67 y.o. male.   Chief Complaint: Colorectal cancer screening. HPI: 67 year old white male presents to the hospital for a screening colonoscopy. See office notes for details.  Past Medical History:  Diagnosis Date  . Anemia   . Anxiety    claustrophobia  . Arthritis   . Complication of anesthesia    difficult to wake up after neck surgery  . COPD (chronic obstructive pulmonary disease) ([place])   . Dementia   . Depression   . Enlarged liver    20-25 years ago  . GERD (gastroesophageal reflux disease)   . Glaucoma   . Hemoptysis    a. 03/2016 in setting of PNA-->resolved.  . Hypertensive heart disease   . Mitral regurgitation    a. 09/2015 Echo: EF 55-60%, no rwma, mild to mod MR, PASP 39mmHg.  . Paroxysmal atrial flutter ([place])    a. Noted 09/2015 following back surgery-->Eliquis (CHA2DS2VASc = 4);  b. 03/2016 Recurrent PAFlutter in setting of PNA-->converted spont.  . 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 Pneumonia 04/2015   "bronchial" pneumonia   . Retinal tear of left eye   . Sleep apnea    does use cpap  . Stroke [facility]) ? 05/2014   peripheral vision loss   Past Surgical History:  Procedure Laterality Date  . BACK SURGERY  09/27/2015   [place]  . CARDIAC CATHETERIZATION  2003  . COLONOSCOPY    . EYE SURGERY  10/2007  . EYE SURGERY Bilateral 2018   Eyelid  . LEG SURGERY     AT AGE 51  . LUMBAR FUSION  09/27/2015  . NECK SURGERY  06/29/2010  . SHOULDER SURGERY  09/24/2011  . TONSILLECTOMY     AT AGE 40  . VASECTOMY     Family History  Problem Relation Age of Onset  . Hypertension Mother   . Macular degeneration Mother   . Stroke Father   . Emphysema Father        smoked  . Diabetes Unknown   . Hypothyroidism Unknown   . Cancer Unknown   . Diabetes type II Brother   . Diabetes type II Brother   . Hyperthyroidism Unknown   . Cataracts Unknown   . Glaucoma Unknown    Social History:  reports that he quit smoking about  20 years ago. His smoking use included cigarettes. He has a 69.00 pack-year smoking history. He has never used smokeless tobacco. He reports that he drinks alcohol. He reports that he does not use drugs.  Allergies:  Allergies  Allergen Reactions  . Ace Inhibitors Cough  . Crestor [Rosuvastatin] Other (See Comments)    MYALGIAS CRAMPS MUSCLE WEAKNESS  . Simvastatin Other (See Comments)    MYALGIAS CRAMPS MUSCLE WEAKNESS   Medications Prior to Admission  Medication Sig Dispense Refill  . acetaminophen (TYLENOL) 500 MG tablet Take 1,000 mg by mouth every 4 (four) hours as needed for mild pain or moderate pain (Takes 2 every 4- 6 hours).     Marland Kitchen albuterol (PROVENTIL HFA;VENTOLIN HFA) 108 (90 Base) MCG/ACT inhaler Inhale 2 puffs into the lungs every 6 (six) hours as needed for wheezing or shortness of breath.     Marland Kitchen apixaban (ELIQUIS) 5 MG TABS tablet Take 1 tablet (5 mg total) by mouth 2 (two) times daily. 180 tablet 3  . CARTIA XT 240 MG 24 hr capsule TAKE 1 CAPSULE BY MOUTH ONCE DAILY 90 capsule  3  . cetirizine (ZYRTEC) 10 MG tablet Take 10 mg by mouth daily.    Marland Kitchen donepezil (ARICEPT) 5 MG tablet Take 1 tablet (5 mg total) by mouth daily. 90 tablet 4  . dorzolamide-timolol (COSOPT) 22.3-6.8 MG/ML ophthalmic solution Place 1 drop into the left eye 2 (two) times daily.    Marland Kitchen ezetimibe (ZETIA) 10 MG tablet Take 10 mg by mouth daily.    Marland Kitchen ketorolac (ACULAR) 0.5 % ophthalmic solution Place 1 drop into the left eye 4 (four) times daily.     Marland Kitchen latanoprost (XALATAN) 0.005 % ophthalmic solution Place 1 drop into both eyes at bedtime.     Marland Kitchen losartan (COZAAR) 50 MG tablet Take 1 tablet (50 mg total) by mouth daily. 90 tablet 3  . memantine (NAMENDA) 10 MG tablet Take 1 tablet (10 mg total) by mouth 2 (two) times daily. 180 tablet 4  . metoprolol succinate (TOPROL-XL) 25 MG 24 hr tablet Take 1 tablet (25 mg total) by mouth daily. KEEP OV. 90 tablet 0  . montelukast (SINGULAIR) 10 MG tablet Take 10 mg by  mouth at bedtime.     . Multiple Vitamins-Minerals (MULTIVITAMIN WITH MINERALS) tablet Take 1 tablet by mouth daily.    . niacin (NIASPAN) 1000 MG CR tablet Take 1,000 mg by mouth at bedtime.     Marland Kitchen omeprazole (PRILOSEC) 20 MG capsule Take 20 mg by mouth 2 (two) times daily before a meal.     . sertraline (ZOLOFT) 100 MG tablet Take 1.5 tablets (150 mg total) by mouth daily. (Patient taking differently: Take 100 mg by mouth at bedtime. ) 180 tablet 4  . SYMBICORT 160-4.5 MCG/ACT inhaler Take 2 puffs by mouth 2 (two) times daily.    . timolol (BETIMOL) 0.5 % ophthalmic solution Place 1 drop into the left eye 2 (two) times daily.     Review of Systems  Constitutional: Negative.   HENT: Negative.   Eyes: Negative.   Respiratory: Negative.   Cardiovascular: Negative.   Gastrointestinal: Positive for blood in stool and heartburn. Negative for abdominal pain, constipation, diarrhea, melena, nausea and vomiting.  Genitourinary: Negative.   Musculoskeletal: Positive for joint pain.  Skin: Negative.   Neurological: Negative.   Endo/Heme/Allergies: Negative.   Psychiatric/Behavioral: Negative.    Blood pressure (!) 179/74, pulse (!) 52, temperature 98.5 F (36.9 C), temperature source Oral, resp. rate 10, height 5\' 9"  (1.753 m), weight 101.6 kg (224 lb), SpO2 100 %. Physical Exam  Constitutional: He is oriented to person, place, and time. He appears well-developed and well-nourished.  HENT:  Head: Normocephalic and atraumatic.  Eyes: Pupils are equal, round, and reactive to light. Conjunctivae and EOM are normal.  Neck: Normal range of motion. Neck supple.  Cardiovascular: Normal rate and regular rhythm.  Respiratory: Effort normal and breath sounds normal.  GI: Soft. Bowel sounds are normal.  Musculoskeletal: Normal range of motion.  Neurological: He is alert and oriented to person, place, and time.  Skin: Skin is warm and dry.  Psychiatric: He has a normal mood and affect. His behavior is  normal. Judgment and thought content normal.   Assessment/Plan Colorectal cancer screening/blood in stool/personal history of tubular adenomas/diverticuilosis-Proceed with a colonoscopy at this time.  Kaulder Zahner, MD 01/22/2018, 7:18 AM

## 2018-01-22 NOTE — Op Note (Addendum)
[facility] Patient Name: Juan Johnson Procedure Date: 01/22/2018 MRN: [id number] Attending MD: Juanita Craver , MD Date of Birth: 09-01-1951 CSN: [id number] Age: 67 Admit Type: Outpatient Procedure:                Diagnostic EGD. Indications:              Iron deficiency anemia, Blood in stool Providers:                Juanita Craver, MD, Elmer Ramp. Hinson, RN, [employer], Technician, [employer], Immunologist. Referring MD:             Bradly Bienenstock, NP Medicines:                Monitored Anesthesia Care Complications:            No immediate complications. Estimated Blood Loss:     Estimated blood loss: none. Procedure:                Pre-anesthesia assessment: - Prior to the                            procedure, a history and physical was performed,                            and patient medications and allergies were                            reviewed. The patient's tolerance of previous                            anesthesia was also reviewed. The risks and                            benefits of the procedure and the sedation options                            and risks were discussed with the patient. All                            questions were answered, and informed consent was                            obtained. Prior anticoagulants: The patient has                            taken Eliquis (Apixaban), last dose was 1 day prior                            to procedure. ASA Grade assessment: IV - A patient                            with severe systemic disease that is a constant  threat to life. After reviewing the risks and                            benefits, the patient was deemed in satisfactory                            condition to undergo the procedure. After obtaining                            informed consent, the endoscope was passed under                            direct vision. Throughout the procedure,  the                            patient's blood pressure, pulse, and oxygen                            saturations were monitored continuously. The                            [id number]) scope was introduced through the                            mouth, and advanced to the second part of duodenum.                            The EGD was accomplished without difficulty. The                            patient tolerated the procedure well. Scope In: Scope Out: Findings:      The examined esophagus and the GEJ appeared widely patent and normal.      Patchy moderate inflammation characterized by erythema and friability       was found in the gastric antrum.      The examined duodenum was normal.      There was no abnormality noted on retroflexion. Impression:               - Normal appearing, widely patent esophagus.                           - Moderate antral gastritis.                           - Normal examined duodenum.                           - No specimens collected. Moderate Sedation:      MAC given. Recommendation:           - High fiber diet with augmented water consumption                            daily.                           -  Continue present medications; restart Eliquis on                            01/25/2018.                           - Return to my office in 4 weeks. Procedure Code(s):        --- Professional ---                           [id number], Esophagogastroduodenoscopy, flexible,                            transoral; diagnostic, including collection of                            specimen(s) by brushing or washing, when performed                            (separate procedure) Diagnosis Code(s):        --- Professional ---                           D50.9, Iron deficiency anemia, unspecified                           K92.1, Melena (includes Hematochezia) CPT copyright 2017 American Medical Association. All rights reserved. The codes documented in this  report are preliminary and upon coder review may  be revised to meet current compliance requirements. Juanita Craver, MD Juanita Craver, MD 01/22/2018 8:29:08 AM This report has been signed electronically. Number of Addenda: 0

## 2018-01-22 NOTE — Anesthesia Procedure Notes (Signed)
Procedure Name: MAC Date/Time: 01/22/2018 7:25 AM Performed by: Cynda Familia, CRNA Pre-anesthesia Checklist: Patient identified, Emergency Drugs available, Patient being monitored, Timeout performed and Suction available Patient Re-evaluated:Patient Re-evaluated prior to induction Oxygen Delivery Method: Nasal cannula Placement Confirmation: positive ETCO2 and breath sounds checked- equal and bilateral Dental Injury: Teeth and Oropharynx as per pre-operative assessment  Comments: Bite block by GI tech

## 2018-01-22 NOTE — Anesthesia Postprocedure Evaluation (Signed)
Anesthesia Post Note  Patient: Juan Johnson  Procedure(s) Performed: ESOPHAGOGASTRODUODENOSCOPY (EGD) WITH PROPOFOL (N/A ) COLONOSCOPY WITH PROPOFOL (N/A )     Patient location during evaluation: PACU Anesthesia Type: MAC Level of consciousness: awake and alert Pain management: pain level controlled Vital Signs Assessment: post-procedure vital signs reviewed and stable Respiratory status: spontaneous breathing, nonlabored ventilation, respiratory function stable and patient connected to nasal cannula oxygen Cardiovascular status: stable and blood pressure returned to baseline Postop Assessment: no apparent nausea or vomiting Anesthetic complications: no    Last Vitals:  Vitals:   01/22/18 0815 01/22/18 0829  BP: (!) 159/72 (!) 171/61  Pulse:    Resp: 15 16  Temp: (!) 36.4 C   SpO2: 100% 100%    Last Pain:  Vitals:   01/22/18 0829  TempSrc:   PainSc: 0-No pain                 Shamra Bradeen EDWARD

## 2018-01-22 NOTE — Transfer of Care (Signed)
Immediate Anesthesia Transfer of Care Note  Patient: Juan Johnson  Procedure(s) Performed: ESOPHAGOGASTRODUODENOSCOPY (EGD) WITH PROPOFOL (N/A ) COLONOSCOPY WITH PROPOFOL (N/A )  Patient Location: PACU and Endoscopy Unit  Anesthesia Type:MAC  Level of Consciousness: awake and alert   Airway & Oxygen Therapy: Patient Spontanous Breathing and Patient connected to nasal cannula oxygen  Post-op Assessment: Report given to RN and Post -op Vital signs reviewed and stable  Post vital signs: Reviewed and stable  Last Vitals:  Vitals Value Taken Time  BP    Temp    Pulse    Resp    SpO2      Last Pain:  Vitals:   01/22/18 0705  TempSrc: Oral  PainSc: 0-No pain         Complications: No apparent anesthesia complications

## 2018-01-23 ENCOUNTER — Telehealth: Payer: Self-pay | Admitting: Cardiology

## 2018-01-23 ENCOUNTER — Encounter (HOSPITAL_COMMUNITY): Payer: Self-pay | Admitting: Gastroenterology

## 2018-01-23 NOTE — Telephone Encounter (Signed)
New message:     Dr. Manns([facility]) would like to know if they can continue to hold the pt Eliquis for 3 more days after the colonoscopy the pt had on yesterday.

## 2018-01-23 NOTE — Telephone Encounter (Signed)
Per phone note 12/22/17, patient was cleared to hold Eliquis for 1 day for procedure. Called [facility] but spoke with Dr. Collene Mares who reports patient has polyps removed which is why she is requesting for patient to hold Eliquis for additional 3 days. She reports this message was routed to Dr. Doug Sou in-basket. Informed Dr. Collene Mares that MD is out of the office but will forward him and our clinical pharmacy staff this message/request for follow up

## 2018-01-23 NOTE — Telephone Encounter (Signed)
Note patient has history of stroke in 2015   Patient should resume Eliquis as soon as possible after proper hemostasis achieved and at discretion of procedure MD.  Harrington Challenger PharmD, BCPS, [place] [address] [place] [address] 01/23/2018 1:33 PM

## 2018-01-23 NOTE — Telephone Encounter (Signed)
This note has been routed to Dr. Collene Mares via [place] fax.

## 2018-01-26 NOTE — Progress Notes (Signed)
Office Visit    Patient Name: Juan Johnson Date of Encounter: 01/28/2018  Primary Care Provider:  Curly Rim, MD Primary Cardiologist:  P. Martinique, MD   Chief Complaint    67 y/o ? with a h/o PAFlutter, HTN, HL, COPD, and prior CVA, who presents for f/u.  Past Medical History    Past Medical History:  Diagnosis Date  . Anemia   . Anxiety    claustrophobia  . Arthritis   . Complication of anesthesia    difficult to wake up after neck surgery  . COPD (chronic obstructive pulmonary disease) ([place])   . Dementia   . Depression   . Enlarged liver    20-25 years ago  . GERD (gastroesophageal reflux disease)   . Glaucoma   . Hemoptysis    a. 03/2016 in setting of PNA-->resolved.  . Hypertensive heart disease   . Mitral regurgitation    a. 09/2015 Echo: EF 55-60%, no rwma, mild to mod MR, PASP 27mmHg.  . Paroxysmal atrial flutter ([place])    a. Noted 09/2015 following back surgery-->Eliquis (CHA2DS2VASc = 4);  b. 03/2016 Recurrent PAFlutter in setting of PNA-->converted spont.  . 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 Pneumonia 04/2015   "bronchial" pneumonia   . Retinal tear of left eye   . Sleep apnea    does use cpap  . Stroke [facility]) ? 05/2014   peripheral vision loss   Past Surgical History:  Procedure Laterality Date  . BACK SURGERY  09/27/2015   [place]  . CARDIAC CATHETERIZATION  2003  . COLONOSCOPY    . COLONOSCOPY WITH PROPOFOL N/A 01/22/2018   Procedure: COLONOSCOPY WITH PROPOFOL;  Surgeon: Juanita Craver, MD;  Location: [facility];  Service: Endoscopy;  Laterality: N/A;  . ESOPHAGOGASTRODUODENOSCOPY (EGD) WITH PROPOFOL N/A 01/22/2018   Procedure: ESOPHAGOGASTRODUODENOSCOPY (EGD) WITH PROPOFOL;  Surgeon: Juanita Craver, MD;  Location: [facility];  Service: Endoscopy;  Laterality: N/A;  . EYE SURGERY  10/2007  . EYE SURGERY Bilateral 2018   Eyelid  . LEG SURGERY     AT AGE 53  . LUMBAR FUSION  09/27/2015  . NECK SURGERY  06/29/2010  .  SHOULDER SURGERY  09/24/2011  . TONSILLECTOMY     AT AGE 40  . VASECTOMY      Allergies  Allergies  Allergen Reactions  . Ace Inhibitors Cough  . Crestor [Rosuvastatin] Other (See Comments)    MYALGIAS CRAMPS MUSCLE WEAKNESS  . Simvastatin Other (See Comments)    MYALGIAS CRAMPS MUSCLE WEAKNESS    History of Present Illness    67 year old male with a history of paroxysmal atrial fibrillation/flutter, hypertension, COPD, prior stroke, sleep apnea, anxiety, depression, Mild to moderate mitral regurgitation, and hyperlipidemia.  He was seen twice in 2017 related to pneumonia and hemoptysis. In that setting, he also developed recurrent A. fib flutter. When he was seen back a week later, he was back in sinus rhythm.  His beta blocker was reduced due to bradycardia.  His last Echo in February 2018 showed only mild MR and normal LV function.   He underwent L5/S1 laminectomy in early May 2018. He was readmitted one week later with HCAP. He had associated AFib with controlled rate. Treated with antibiotics. Had persistent cough for > one month. CT negative.   In December he was diagnosed with iron deficiency anemia. Hgb returned to normal on iron therapy. He did undergo upper and lower EGD on 01/22/18.  This was remarkable for multiple colon polyps, diverticulosis, and moderate antral gastritis.   On follow up today he is feeling well. Denies any palpitations, chest pain, dyspnea. 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 Authorizing Provider  acetaminophen (TYLENOL) 500 MG tablet Take 500 mg by mouth every 4 (four) hours as needed for mild pain or moderate pain (Takes 2 every 4- 6 hours).    Yes Historical Provider, MD  albuterol (PROVENTIL HFA;VENTOLIN HFA) 108 (90 Base) MCG/ACT inhaler Inhale into the lungs every  6 (six) hours as needed for wheezing or shortness of breath.   Yes Historical Provider, MD  ALPRAZolam (XANAX) 0.5 MG tablet Take 1-2 30 minutes before MRi do not drive. May repeat if needed. 02/15/16  Yes Melvenia Beam, MD  apixaban (ELIQUIS) 5 MG TABS tablet Take 1 tablet (5 mg total) by mouth 2 (two) times daily. 01/11/16  Yes Valeri Sula M Martinique, MD  BREO ELLIPTA 100-25 MCG/INH AEPB Take 1 puff by mouth daily. 02/13/16  Yes Historical Provider, MD  Cetirizine HCl (ZYRTEC PO) Take 10 mg by mouth daily.    Yes Historical Provider, MD  dextromethorphan-guaiFENesin (MUCINEX DM) 30-600 MG 12hr tablet Take 1 tablet by mouth 2 (two) times daily as needed for cough.    Yes Historical Provider, MD  diltiazem (CARDIZEM CD) 240 MG 24 hr capsule Take 1 capsule (240 mg total) by mouth daily. 01/30/16  Yes Lu Paradise M Martinique, MD  donepezil (ARICEPT) 10 MG tablet Take 0.5 tablets by mouth daily. 05/29/16  Yes Historical Provider, MD  ezetimibe (ZETIA) 10 MG tablet Take 10 mg by mouth daily.   Yes Historical Provider, MD  ketorolac (ACULAR) 0.5 % ophthalmic solution Place 1 drop into the left eye 4 (four) times daily.  08/22/14  Yes Historical Provider, MD  latanoprost (XALATAN) 0.005 % ophthalmic solution Place 1 drop into both eyes at bedtime.    Yes Historical Provider, MD  metoprolol tartrate (LOPRESSOR) 25 MG tablet Take 0.5 tablets (12.5 mg total) by mouth 2 (two) times daily. 02/15/16  Yes Tiasia Weberg M Martinique, MD  montelukast (SINGULAIR) 10 MG tablet Take 10 mg by mouth at bedtime.  03/09/15  Yes Historical Provider, MD  Multiple Vitamins-Minerals (MULTIVITAMIN WITH MINERALS) tablet Take 1 tablet by mouth daily.   Yes Historical Provider, MD  niacin (NIASPAN) 1000 MG CR tablet Take 1,000 mg by mouth daily. 03/09/15  Yes Historical Provider, MD  omeprazole (PRILOSEC) 20 MG capsule Take 20 mg by mouth 2 (two) times daily before a meal.  03/09/15  Yes Historical Provider, MD  sertraline (ZOLOFT) 50 MG tablet Take 1 tablet (50 mg  total) by mouth daily. 06/03/16  Yes Melvenia Beam, MD  valsartan-hydrochlorothiazide (DIOVAN-HCT) 160-12.5 MG tablet Take 1 tablet by mouth daily. 01/29/16  Yes Malcom Selmer M Martinique, MD    Review of Systems    As noted above.   All other systems reviewed and are otherwise negative except as noted above.  Physical Exam    VS:  BP (!) 152/72   Pulse (!) 45   Ht 5\' 9"  (1.753 m)   Wt 229 lb 12.8 oz (104.2 kg)   BMI 33.94 kg/m  , BMI Body mass index is 33.94 kg/m. GENERAL:  Well appearing WM in NAD, obese HEENT:  PERRL, EOMI,  sclera are clear. Oropharynx is clear. NECK:  No jugular venous distention, carotid upstroke brisk and symmetric, no bruits, no thyromegaly or adenopathy LUNGS:  Clear to auscultation bilaterally CHEST:  Unremarkable HEART:  RRR-slow,  PMI not displaced or sustained,S1 and S2 within normal limits, no S3, no S4: no clicks, no rubs, no murmurs ABD:  Soft, nontender. BS +, no masses or bruits. No hepatomegaly, no splenomegaly EXT:  2 + pulses throughout, no edema, no cyanosis no clubbing SKIN:  Warm and dry.  No rashes NEURO:  Alert and oriented x 3. Cranial nerves II through XII intact. PSYCH:  Cognitively intact    Accessory Clinical Findings    Lab Results  Component Value Date   WBC 12.8 (H) 02/19/2017   HGB 9.2 (L) 02/19/2017   HCT 29.1 (L) 02/19/2017   PLT 315 02/19/2017   GLUCOSE 113 (H) 02/19/2017   CHOL  05/27/2010    172        ATP III CLASSIFICATION:  <200     mg/dL   Desirable  200-239  mg/dL   Borderline High  >=240    mg/dL   High          TRIG 206 (H) 05/27/2010   HDL 34 (L) 05/27/2010   LDLCALC  05/27/2010    97        Total Cholesterol/HDL:CHD Risk Coronary Heart Disease Risk Table                     Men   Women  1/2 Average Risk   3.4   3.3  Average Risk       5.0   4.4  2 X Average Risk   9.6   7.1  3 X Average Risk  23.4   11.0        Use the calculated Patient Ratio above and the CHD Risk Table to determine the patient's CHD  Risk.        ATP III CLASSIFICATION (LDL):  <100     mg/dL   Optimal  100-129  mg/dL   Near or Above                    Optimal  130-159  mg/dL   Borderline  160-189  mg/dL   High  >190     mg/dL   Very High   ALT 15 (L) 02/16/2017   AST 24 02/16/2017   NA 135 02/19/2017   K 3.6 02/19/2017   CL 105 02/19/2017   CREATININE 0.90 02/19/2017   BUN 8 02/19/2017   CO2 22 02/19/2017   TSH 0.651 10/03/2015   INR 1.18 02/16/2017   HGBA1C 6.0 (H) 06/24/2014   Labs dated 09/12/16: glucose 103, UA normal. Hgb 12.0. Normal CMET. Hep C antibody neg. Cholesterol 200, triglycerides 131, HDL 42, LDL 132.  Dated 09/15/17: cholesterol 201, triglycerides 87, HDL 42, LDL 142.  Dated 12/18/17: normal CMET and CBC.  Ecg today shows sinus brady rate 45. Otherwise normal. I have personally reviewed and interpreted this study.   Echo 12/05/16:Study Conclusions  - Left ventricle: The cavity size was mildly dilated. Wall   thickness was normal. Systolic function was normal. The estimated   ejection fraction was in the range of 50% to 55%. Wall motion was   normal; there were no regional wall motion abnormalities. Left   ventricular diastolic function parameters were normal. - Mitral valve: There was mild regurgitation. - Left atrium: The atrium was  mildly dilated. - Pulmonary arteries: Systolic pressure was mildly increased. PA   peak pressure: 32 mm Hg (S).  Impressions:  - Normal LV systolic and diastolic function; mild LVE; mild MR;   mild LAE; trace TR; mildly elevated pulmonary pressure.   Assessment & Plan    1.  Paroxysmal atrial fibrillation/flutter: previously triggered by fever and PNA.  He remains in sinus rhythm today. Will continue rate control strategy with  cardizem. Given slow HR we will discontinue metoprolol.  Continue Eliquis for anticoagulation. Follow up in 6 months.  2. Hypertensive heart disease: Blood pressure is persistently high. Will increase losartan back to 100 mg  daily. We had previously dropped the dose due to hypotension but now that we are stopping metoprolol he should tolerate higher dose.   3. Hyperlipidemia: He is treated with Zetia and niacin.  Lipids have been managed by primary care. Intolerant of statins.  4. Mild  mitral regurgitation by Echo. Asymptomatic.  5. Disposition: follow up in 6 months  Yoland Scherr [place], [place] 01/28/2018, 10:37 AM

## 2018-01-28 ENCOUNTER — Encounter: Payer: Self-pay | Admitting: Cardiology

## 2018-01-28 ENCOUNTER — Ambulatory Visit (INDEPENDENT_AMBULATORY_CARE_PROVIDER_SITE_OTHER): Payer: Medicare Other | Admitting: Cardiology

## 2018-01-28 VITALS — BP 152/72 | HR 45 | Ht 69.0 in | Wt 229.8 lb

## 2018-01-28 DIAGNOSIS — I10 Essential (primary) hypertension: Secondary | ICD-10-CM

## 2018-01-28 DIAGNOSIS — E7800 Pure hypercholesterolemia, unspecified: Secondary | ICD-10-CM

## 2018-01-28 DIAGNOSIS — I480 Paroxysmal atrial fibrillation: Secondary | ICD-10-CM | POA: Diagnosis not present

## 2018-01-28 MED ORDER — LOSARTAN POTASSIUM 100 MG PO TABS: 100.0000 mg | ORAL_TABLET | Freq: Every day | ORAL | 3 refills | Status: DC

## 2018-01-28 NOTE — Patient Instructions (Signed)
Stop taking Toprol XL  Increase losartan to 100 mg daily

## 2018-01-31 ENCOUNTER — Other Ambulatory Visit: Payer: Self-pay | Admitting: Cardiology

## 2018-04-20 ENCOUNTER — Emergency Department (HOSPITAL_COMMUNITY)
Admission: EM | Admit: 2018-04-20 | Discharge: 2018-04-20 | Disposition: A | Discharge status: Home / Self Care | Payer: Medicare Other | Attending: Emergency Medicine | Admitting: Emergency Medicine

## 2018-04-20 ENCOUNTER — Emergency Department (HOSPITAL_COMMUNITY): Payer: Medicare Other

## 2018-04-20 ENCOUNTER — Encounter (HOSPITAL_COMMUNITY): Payer: Self-pay

## 2018-04-20 DIAGNOSIS — Z87891 Personal history of nicotine dependence: Secondary | ICD-10-CM | POA: Diagnosis not present

## 2018-04-20 DIAGNOSIS — R05 Cough: Secondary | ICD-10-CM | POA: Diagnosis present

## 2018-04-20 DIAGNOSIS — I10 Essential (primary) hypertension: Secondary | ICD-10-CM | POA: Diagnosis not present

## 2018-04-20 DIAGNOSIS — Z79899 Other long term (current) drug therapy: Secondary | ICD-10-CM | POA: Diagnosis not present

## 2018-04-20 DIAGNOSIS — J181 Lobar pneumonia, unspecified organism: Secondary | ICD-10-CM | POA: Diagnosis not present

## 2018-04-20 DIAGNOSIS — J449 Chronic obstructive pulmonary disease, unspecified: Secondary | ICD-10-CM | POA: Insufficient documentation

## 2018-04-20 DIAGNOSIS — J189 Pneumonia, unspecified organism: Secondary | ICD-10-CM

## 2018-04-20 LAB — COMPREHENSIVE METABOLIC PANEL
ALK PHOS: 97 U/L (ref 38–126)
ALT: 19 U/L (ref 0–44)
ANION GAP: 10 (ref 5–15)
AST: 22 U/L (ref 15–41)
Albumin: 3.8 g/dL (ref 3.5–5.0)
BILIRUBIN TOTAL: 0.5 mg/dL (ref 0.3–1.2)
BUN: 16 mg/dL (ref 8–23)
CO2: 23 mmol/L (ref 22–32)
CREATININE: 0.94 mg/dL (ref 0.61–1.24)
Calcium: 9 mg/dL (ref 8.9–10.3)
Chloride: 104 mmol/L (ref 98–111)
Glucose, Bld: 115 mg/dL — ABNORMAL HIGH (ref 70–99)
Potassium: 3.5 mmol/L (ref 3.5–5.1)
Sodium: 137 mmol/L (ref 135–145)
Total Protein: 7.2 g/dL (ref 6.5–8.1)

## 2018-04-20 LAB — CBC WITH DIFFERENTIAL/PLATELET
BASOS PCT: 0 %
Basophils Absolute: 0 10*3/uL (ref 0.0–0.1)
EOS ABS: 0.1 10*3/uL (ref 0.0–0.7)
Eosinophils Relative: 0 %
HEMATOCRIT: 38.7 % — AB (ref 39.0–52.0)
HEMOGLOBIN: 12.6 g/dL — AB (ref 13.0–17.0)
Lymphocytes Relative: 7 %
Lymphs Abs: 1.2 10*3/uL (ref 0.7–4.0)
MCH: 30 pg (ref 26.0–34.0)
MCHC: 32.6 g/dL (ref 30.0–36.0)
MCV: 92.1 fL (ref 78.0–100.0)
Monocytes Absolute: 1.3 10*3/uL — ABNORMAL HIGH (ref 0.1–1.0)
Monocytes Relative: 7 %
NEUTROS ABS: 14.4 10*3/uL — AB (ref 1.7–7.7)
NEUTROS PCT: 86 %
Platelets: 321 10*3/uL (ref 150–400)
RBC: 4.2 MIL/uL — AB (ref 4.22–5.81)
RDW: 12.5 % (ref 11.5–15.5)
WBC: 16.9 10*3/uL — AB (ref 4.0–10.5)

## 2018-04-20 LAB — LACTIC ACID, PLASMA
LACTIC ACID, VENOUS: 1.2 mmol/L (ref 0.5–1.9)
Lactic Acid, Venous: 1.6 mmol/L (ref 0.5–1.9)

## 2018-04-20 MED ORDER — AZITHROMYCIN 250 MG PO TABS: 250.0000 mg | ORAL_TABLET | Freq: Every day | ORAL | 0 refills | Status: DC

## 2018-04-20 MED ORDER — SODIUM CHLORIDE 0.9 % IV SOLN
500.0000 mg | INTRAVENOUS | Status: DC
  Administered 2018-04-20: 500 mg via INTRAVENOUS
  Filled 2018-04-20: qty 500

## 2018-04-20 MED ORDER — IPRATROPIUM-ALBUTEROL 0.5-2.5 (3) MG/3ML IN SOLN
3.0000 mL | Freq: Once | RESPIRATORY_TRACT | Status: AC
  Administered 2018-04-20: 3 mL via RESPIRATORY_TRACT
  Filled 2018-04-20: qty 6

## 2018-04-20 MED ORDER — ALBUTEROL SULFATE (2.5 MG/3ML) 0.083% IN NEBU: 2.5000 mg | INHALATION_SOLUTION | Freq: Four times a day (QID) | RESPIRATORY_TRACT | 2 refills | Status: AC | PRN

## 2018-04-20 MED ORDER — IPRATROPIUM-ALBUTEROL 0.5-2.5 (3) MG/3ML IN SOLN
3.0000 mL | Freq: Once | RESPIRATORY_TRACT | Status: AC
  Administered 2018-04-20: 3 mL via RESPIRATORY_TRACT

## 2018-04-20 MED ORDER — SODIUM CHLORIDE 0.9 % IV BOLUS
500.0000 mL | Freq: Once | INTRAVENOUS | Status: AC
  Administered 2018-04-20: 500 mL via INTRAVENOUS

## 2018-04-20 MED ORDER — SODIUM CHLORIDE 0.9 % IV SOLN
1.0000 g | Freq: Once | INTRAVENOUS | Status: AC
  Administered 2018-04-20: 1 g via INTRAVENOUS
  Filled 2018-04-20: qty 10

## 2018-04-20 NOTE — Discharge Instructions (Addendum)
You have a small amount of pneumonia in the base of your left lung.  Increase fluids.  Rest.  Prescription for antibiotic to start tomorrow.  Also prescription for albuterol solution and nebulizer machine.

## 2018-04-20 NOTE — ED Triage Notes (Signed)
Pt brought in for cough that increased after he was seen by PCP. EMS says family called due to confusion, but answered all questions correctly

## 2018-04-20 NOTE — ED Provider Notes (Signed)
[facility] EMERGENCY DEPARTMENT Provider Note   CSN: [id number] Arrival date & time: 04/20/18  1634     History   Chief Complaint Chief Complaint  Patient presents with  . Cough    HPI DESHONE LYSSY is a 67 y.o. male.  Level 5 caveat for dementia.  Patient presents with worsening cough and wheezing for 2 months, getting worse the past few days.  He was seen by his pulmonologist today at [facility] and transferred to the emergency department.  He has known COPD and is on Symbicort and albuterol at home along with CPAP at night.  Review of systems positive for chills and productive cough.  Severity of symptoms is moderate.     Past Medical History:  Diagnosis Date  . Anemia   . Anxiety    claustrophobia  . Arthritis   . Complication of anesthesia    difficult to wake up after neck surgery  . COPD (chronic obstructive pulmonary disease) ([place])   . Dementia   . Depression   . Enlarged liver    20-25 years ago  . GERD (gastroesophageal reflux disease)   . Glaucoma   . Hemoptysis    a. 03/2016 in setting of PNA-->resolved.  . Hypertensive heart disease   . Mitral regurgitation    a. 09/2015 Echo: EF 55-60%, no rwma, mild to mod MR, PASP 5mmHg.  . Paroxysmal atrial flutter ([place])    a. Noted 09/2015 following back surgery-->Eliquis (CHA2DS2VASc = 4);  b. 03/2016 Recurrent PAFlutter in setting of PNA-->converted spont.  . 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 Pneumonia 04/2015   "bronchial" pneumonia   . Retinal tear of left eye   . Sleep apnea    does use cpap  . Stroke [facility]) ? 05/2014   peripheral vision loss    Patient Active Problem List   Diagnosis Date Noted  . GERD (gastroesophageal reflux disease) 02/17/2017  . OSA (obstructive sleep apnea) 02/17/2017  . HCAP (healthcare-associated pneumonia) 02/17/2017  . Sepsis ([place]) 02/17/2017  . Spondylolisthesis at L5-S1 level 02/14/2017  . Hypertensive heart disease   . Paroxysmal atrial flutter  ([place])   . Dementia without behavioral disturbance 06/04/2016  . Dementia with behavioral disturbance 04/18/2016  . Atrial flutter ([place]) 01/11/2016  . Mitral valve regurgitation: mild to moderate 10/23/2015  . New onset atrial flutter ([place]) 10/04/2015  . Atrial fibrillation with rapid ventricular response ([place])   . Anemia 10/03/2015  . Stroke ([place])   . Spondylolisthesis of lumbar region 09/27/2015  . Lobar pneumonia due to unspecified organism 05/25/2015  . Snoring 03/16/2015  . Witnessed apneic spells 03/16/2015  . Obese 03/16/2015  . Morning headache 03/16/2015  . Excessive daytime sleepiness 03/16/2015  . COPD GOLD I 09/25/2014  . Upper airway cough syndrome vs cough variant asthma 08/22/2014  . Essential hypertension 08/22/2014  . Diplopia 06/25/2014  . Vision loss, bilateral 06/25/2014  . Weakness 06/25/2014  . Unspecified hereditary and idiopathic peripheral neuropathy 06/25/2014    Past Surgical History:  Procedure Laterality Date  . BACK SURGERY  09/27/2015   [place]  . CARDIAC CATHETERIZATION  2003  . COLONOSCOPY    . COLONOSCOPY WITH PROPOFOL N/A 01/22/2018   Procedure: COLONOSCOPY WITH PROPOFOL;  Surgeon: Juanita Craver, MD;  Location: [facility];  Service: Endoscopy;  Laterality: N/A;  . ESOPHAGOGASTRODUODENOSCOPY (EGD) WITH PROPOFOL N/A 01/22/2018   Procedure: ESOPHAGOGASTRODUODENOSCOPY (EGD) WITH PROPOFOL;  Surgeon: Juanita Craver, MD;  Location: [facility];  Service:  Endoscopy;  Laterality: N/A;  . EYE SURGERY  10/2007  . EYE SURGERY Bilateral 2018   Eyelid  . LEG SURGERY     AT AGE 67  . LUMBAR FUSION  09/27/2015  . NECK SURGERY  06/29/2010  . SHOULDER SURGERY  09/24/2011  . TONSILLECTOMY     AT AGE 38  . VASECTOMY          Home Medications    Prior to Admission medications   Medication Sig Start Date End Date Taking? Authorizing Provider  acetaminophen (TYLENOL) 500 MG tablet Take 1,000 mg by mouth every 4 (four) hours as needed for mild pain or  moderate pain (Takes 2 every 4- 6 hours).     [provider]  albuterol (PROVENTIL) (2.5 MG/3ML) 0.083% nebulizer solution Take 3 mLs (2.5 mg total) by nebulization every 6 (six) hours as needed for wheezing or shortness of breath. 04/20/18   Nat Christen, MD  azithromycin (ZITHROMAX) 250 MG tablet Take 1 tablet (250 mg total) by mouth daily. Take first 2 tablets together, then 1 every day until finished. 04/20/18   Nat Christen, MD  CARTIA XT 240 MG 24 hr capsule TAKE 1 CAPSULE BY MOUTH ONCE DAILY 01/09/18   Martinique, Peter M, MD  cetirizine (ZYRTEC) 10 MG tablet Take 10 mg by mouth daily.    [provider]  donepezil (ARICEPT) 5 MG tablet Take 1 tablet (5 mg total) by mouth daily. 08/12/17   Melvenia Beam, MD  dorzolamide-timolol (COSOPT) 22.3-6.8 MG/ML ophthalmic solution Place 1 drop into the left eye 2 (two) times daily.    [provider]  ELIQUIS 5 MG TABS tablet TAKE 1 TABLET BY MOUTH TWICE DAILY 02/02/18   [place], Peter M, MD  ezetimibe (ZETIA) 10 MG tablet Take 10 mg by mouth daily.    [provider]  ketorolac (ACULAR) 0.5 % ophthalmic solution Place 1 drop into the left eye 4 (four) times daily.  08/22/14   [provider]  latanoprost (XALATAN) 0.005 % ophthalmic solution Place 1 drop into both eyes at bedtime.     [provider]  losartan (COZAAR) 100 MG tablet Take 1 tablet (100 mg total) by mouth daily. 01/28/18 01/23/19  Martin[place], Peter M, MD  memantine (NAMENDA) 10 MG tablet Take 1 tablet (10 mg total) by mouth 2 (two) times daily. 08/12/17   Melvenia Beam, MD  montelukast (SINGULAIR) 10 MG tablet Take 10 mg by mouth at bedtime.  03/09/15   [provider]  Multiple Vitamins-Minerals (MULTIVITAMIN WITH MINERALS) tablet Take 1 tablet by mouth daily.    [provider]  niacin (NIASPAN) 1000 MG CR tablet Take 1,000 mg by mouth at bedtime.  03/09/15   [provider]  omeprazole (PRILOSEC) 20 MG capsule Take 20  mg by mouth 2 (two) times daily before a meal.  03/09/15   [provider]  sertraline (ZOLOFT) 100 MG tablet Take 1.5 tablets (150 mg total) by mouth daily. Patient taking differently: Take 100 mg by mouth at bedtime.  08/12/17   Melvenia Beam, MD  SYMBICORT 160-4.5 MCG/ACT inhaler Take 2 puffs by mouth 2 (two) times daily. 11/03/17   [provider]  timolol (BETIMOL) 0.5 % ophthalmic solution Place 1 drop into the left eye 2 (two) times daily.    [provider]    Family History Family History  Problem Relation Age of Onset  . Hypertension Mother   . Macular degeneration Mother   .  Stroke Father   . Emphysema Father        smoked  . Diabetes Unknown   . Hypothyroidism Unknown   . Cancer Unknown   . Diabetes type II Brother   . Diabetes type II Brother   . Hyperthyroidism Unknown   . Cataracts Unknown   . Glaucoma Unknown     Social History Social History   Tobacco Use  . Smoking status: Former Smoker    Packs/day: 3.00    Years: 23.00    Pack years: 69.00    Types: Cigarettes    Last attempt to quit: 10/12/1997    Years since quitting: 20.5  . Smokeless tobacco: Never Used  Substance Use Topics  . Alcohol use: Yes    Alcohol/week: 0.0 oz    Comment: occasional now. former heavy/daily drinker.  . Drug use: No     Allergies   Ace inhibitors; Crestor [rosuvastatin]; and Simvastatin   Review of Systems Review of Systems  Unable to perform ROS: Dementia     Physical Exam Updated Vital Signs BP (!) 131/59   Pulse 75   Temp (!) 101.2 F (38.4 C) (Oral)   Resp 20   Wt 103.9 kg (229 lb)   SpO2 96%   BMI 33.82 kg/m   Physical Exam  Constitutional: He is oriented to person, place, and time.  Coughing and wheezing.  HENT:  Head: Normocephalic and atraumatic.  Eyes: Conjunctivae are normal.  Neck: Neck supple.  Cardiovascular: Normal rate and regular rhythm.  Pulmonary/Chest: Effort normal.  No tachypnea; bilateral  expiratory wheezes  Abdominal: Soft. Bowel sounds are normal.  Musculoskeletal: Normal range of motion.  Neurological: He is alert and oriented to person, place, and time.  Skin: Skin is warm and dry.  Psychiatric: He has a normal mood and affect. His behavior is normal.  Nursing note and vitals reviewed.    ED Treatments / Results  Labs (all labs ordered are listed, but only abnormal results are displayed) Labs Reviewed  COMPREHENSIVE METABOLIC PANEL - Abnormal; Notable for the following components:      Result Value   Glucose, Bld 115 (*)    All other components within normal limits  CBC WITH DIFFERENTIAL/PLATELET - Abnormal; Notable for the following components:   WBC 16.9 (*)    RBC 4.20 (*)    Hemoglobin 12.6 (*)    HCT 38.7 (*)    Neutro Abs 14.4 (*)    Monocytes Absolute 1.3 (*)    All other components within normal limits  LACTIC ACID, PLASMA  LACTIC ACID, PLASMA    EKG EKG Interpretation  Date/Time:  Monday April 20 2018 16:44:11 EDT Ventricular Rate:  88 PR Interval:    QRS Duration: 104 QT Interval:  355 QTC Calculation: 430 R Axis:   3 Text Interpretation:  Sinus rhythm Low voltage, precordial leads Confirmed by Nat Christen [id number]) on 04/20/2018 6:38:59 PM   Radiology Dg Chest 2 View  Result Date: 04/20/2018 CLINICAL DATA:  Acute onset of cough and fever that began today. Current history of dementia. EXAM: CHEST - 2 VIEW COMPARISON:  CTA chest 02/17/2017. Chest x-rays 02/16/2017, 03/25/2016 and earlier. FINDINGS: Cardiac silhouette normal in size, unchanged. Thoracic aorta mildly atherosclerotic, unchanged. Hilar and mediastinal contours otherwise unremarkable. Suboptimal inspiration. Airspace consolidation involving the LEFT lung base with a possible small LEFT pleural effusion. Lungs otherwise clear. Pulmonary vascularity normal. IMPRESSION: LEFT lower lobe pneumonia and possible associated small LEFT pleural effusion. Electronically Signed   By: Marcello Moores  Lawrence M.D.   On: 04/20/2018 17:09    Procedures Procedures (including critical care time)  Medications Ordered in ED Medications  azithromycin (ZITHROMAX) 500 mg in sodium chloride 0.9 % 250 mL IVPB (0 mg Intravenous Stopped 04/20/18 1921)  ipratropium-albuterol (DUONEB) 0.5-2.5 (3) MG/3ML nebulizer solution 3 mL (3 mLs Nebulization Given 04/20/18 1736)  ipratropium-albuterol (DUONEB) 0.5-2.5 (3) MG/3ML nebulizer solution 3 mL (3 mLs Nebulization Given 04/20/18 1736)  cefTRIAXone (ROCEPHIN) 1 g in sodium chloride 0.9 % 100 mL IVPB (0 g Intravenous Stopped 04/20/18 1802)  sodium chloride 0.9 % bolus 500 mL (0 mLs Intravenous Stopped 04/20/18 1803)     Initial Impression / Assessment and Plan / ED Course  I have reviewed the triage vital signs and the nursing notes.  Pertinent labs & imaging results that were available during my care of the patient were reviewed by me and considered in my medical decision making (see chart for details).     Patient with a known history of COPD presents with persistent coughing and wheezing.  Chest x-ray reveals a left lower lobe pneumonia.  Patient given nebulizer treatments x2 in the ED.  IV Rocephin and IV Zithromax.  Patient is stable to be treated as an outpatient.  Discharge medications Zithromax and albuterol solution for nebulizer machine.  Final Clinical Impressions(s) / ED Diagnoses   Final diagnoses:  Community acquired pneumonia of left lower lobe of lung [facility])    ED Discharge Orders        Ordered    azithromycin (ZITHROMAX) 250 MG tablet  Daily     04/20/18 2010    albuterol (PROVENTIL) (2.5 MG/3ML) 0.083% nebulizer solution  Every 6 hours PRN    Note to Pharmacy:  Patient will need nebulizer machine also.   04/20/18 2011       Nat Christen, MD 04/20/18 2050

## 2018-04-20 NOTE — ED Notes (Signed)
Pt taken to xray 

## 2018-05-12 ENCOUNTER — Ambulatory Visit: Payer: Medicare Other | Admitting: Neurology

## 2018-05-14 ENCOUNTER — Encounter: Payer: Self-pay | Admitting: Neurology

## 2018-05-14 ENCOUNTER — Ambulatory Visit (INDEPENDENT_AMBULATORY_CARE_PROVIDER_SITE_OTHER): Payer: Medicare Other | Admitting: Neurology

## 2018-05-14 VITALS — BP 110/55 | HR 53 | Ht 69.0 in | Wt 215.0 lb

## 2018-05-14 DIAGNOSIS — E538 Deficiency of other specified B group vitamins: Secondary | ICD-10-CM

## 2018-05-14 DIAGNOSIS — E519 Thiamine deficiency, unspecified: Secondary | ICD-10-CM

## 2018-05-14 DIAGNOSIS — F0391 Unspecified dementia with behavioral disturbance: Secondary | ICD-10-CM | POA: Diagnosis not present
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
## 2018-05-14 NOTE — Patient Instructions (Signed)
Recommendations to prevent or slow progression of cognitive decline:   Exercise You should increase exercise 30 to 45 minutes per day at least 3 days a week although 5 to 7 would be preferred. Any type of exercise (including walking) is acceptable although a recumbent bicycle may be best if you are unsteady. Disease related apathy can be a significant roadblock to exercise and the only way to overcome this is to make it a daily routine and perhaps have a reward at the end (something your loved one loves to eat or drink perhaps) or a personal trainer coming to the home can also be very useful. In general a structured, repetitive schedule is best.   Cardiovascular Health: You should optimize all cardiovascular risk factors (blood pressure, sugar, cholesterol) as vascular disease such as strokes and heart attacks can make memory problems much worse.   Diet: Eating a heart healthy (Mediterranean) diet is also a good idea; fish and poultry instead of red meat, nuts (mostly non-peanuts), vegetables, fruits, olive oil or canola oil (instead of butter), minimal salt (use other spices to flavor foods), whole grain rice, bread, cereal and pasta and wine in moderation.  General Health: Any diseases which effect your body will effect your brain such as a pneumonia, urinary infection, blood clot, heart attack or stroke. Keep contact with your primary care doctor for regular follow ups.  Sleep. A good nights sleep is healthy for the brain. Seven hours is recommended. If you have insomnia or poor sleep habits see the recommendations below  Tips: Structured and consistent daytime and nighttime routine, including regular wake times, bedtimes, and mealtimes, will be important for the patient to avoid confusion. Keeping frequently used items in designated places will help reduce stress from searching. If there are worries about getting lost do not let the patient leave home unaccompanied. They might benefit from wearing an  identification bracelet that will help others assist in finding home if they become lost. Information about nationwide safe return services and other helpful resources may be obtained through the Alzheimer's Association helpline at 1800-2817169422.  Finances, Power of Producer, television/film/video Directives: You should consider putting legal safeguards in place with regard to financial and medical decision making. While the spouse always has power of attorney for medical and financial issues in the absence of any form, you should consider what you want in case the spouse / caregiver is no longer around or capable of making decisions.   [place] : http://www.welch.com/.pdf  Or Google "Britt" AND "An Forensic scientist for [employer]  Other States: ApartmentMom.com.ee  The signature on these forms should be notarized.   If you would like to be tested to see if you are driving safely, [facility] has a Clinical Driving Evaluation. To schedule an appointment call [phone].                RESOURCES:  Memory Loss: Improve your short term memory By Silvio Pate  The Alzheimer's Reading Room http://www.alzheimersreadingroom.com/   The Alzheimer's Compendium http://www.alzcompend.info/  [employer] www.dukefamilysupport.SWF [phone]  Recommended resources for caregivers (All can be purchased on [employer]):  1) A Caregiver's Guide to Dementia: Using Activities and Other Strategies to Prevent, Reduce and Manage Behavioral Symptoms by Osie Bond. [place] and [employer]   2) A Caregiver's Guide to [employer] Dementia by Caleen Essex MS BSN and Gaston Islam   3) What If It's Not Alzheimer's?: A Caregiver's Guide to Dementia by Koren Shiver (Author), Lattie Haw  Radin Occupational hygienist)  3) The 36 hour day by Rabins and Mace  4)  Understanding Difficult Behaviors by Merita Norton and White  Online course for helping caregivers reduce stress, guilt and frustration called the Caregivers Helpbook. The website is www.powerfultoolsforcaregivers.org  As a caregiver you are a Art gallery manager. Problems you face as a caregiver are usually unique to your situation and the way your loved-one's disease manifests itself. The best way to use these books is to look at the Table of Contents and read any chapters of interest or that apply to challenges you are having as a caregiver.  NATIONAL RESOURCES: For more information on neurological disorders or research programs funded by the [employer] of Neurological Disorders and Stroke, contact the Institute's Agricultural consultant (BRAIN) at: [place] [address] [phone] (toll-free) MasterBoxes.it  Information on dementia is also available from the following organizations: Alzheimer's Disease Education and Referral ([place]) [place] [facility] [address] [place] [address] [phone] (toll-free) DVDEnthusiasts.nl  Alzheimer's Association [address] [place] [address] [phone] (toll-free, 24-hour helpline) [phone] (TDD) CapitalMile.co.nz  Alzheimer's Foundation of America [address], [place] [address] [phone] (toll-free) www.alzfdn.org  [facility] [address] [place] [address] [phone] www.alzdiscovery.org  Association for [place] [address], [place] [address] [phone] (toll-free) [place] [address] [phone] (toll-free) www.brightfocus.org/alzheimers  Doran Stabler [facility] [address], [place] [address] [phone] www.https://lambert-jackson.net/  Lewy Body Dementia Association [address] [place] [address] [phone] (toll-free LBD Caregiver Link) www.lbda.[place], [place], [place] [address] [phone] (toll-free) [phone] [facility]) https://carter.com/  National Organization for Rare Disorders [address] [place] [address] 1-497-026-VZCH [phone]) (toll-free) www.rarediseases.org  The Dementias: Hope Through Research was jointly produced by the [employer] of Neurological Disorders and Stroke (NINDS) and the [employer] on Aging (NIA), both part of the [employer], the [employer] research agency-supporting scientific studies that turn discovery into health. NINDS is the nation's leading funder of research on the brain and nervous system. The NINDS mission is to reduce the burden of neurological disease. For more information and resources, visit MasterBoxes.it [1] or call 424-719-4923. NIA leads the federal government effort conducting and supporting research on aging and the health and well-being of older people. NIA's Alzheimer's Disease Education and Referral (ADEAR) Center offers information and publications on dementia and caregiving for families, caregivers, and professionals. For more information, visit DVDEnthusiasts.nl [2] or call [phone]. Also available from NIA are publications and information about Alzheimer's disease as well as the booklets Frontotemporal Disorders: Information for Patients, Families, and Caregivers and Lewy Body Dementia: Information for Patients, Families, and Professionals. Source URL: SocialSpecialists.co.nz

## 2018-05-15 ENCOUNTER — Encounter: Payer: Self-pay | Admitting: Neurology

## 2018-05-17 LAB — B12 AND FOLATE PANEL
Folate: 18.6 ng/mL (ref >3.0)
Vitamin B-12: 683 pg/mL (ref 232–1245)

## 2018-05-17 LAB — VITAMIN B1: Thiamine: 150.6 nmol/L (ref 66.5–200.0)

## 2018-05-17 LAB — HOMOCYSTEINE: HOMOCYSTEINE: 14.7 umol/L (ref 0.0–15.0)

## 2018-05-17 LAB — METHYLMALONIC ACID, SERUM: Methylmalonic Acid: 175 nmol/L (ref 0–378)

## 2018-06-11 ENCOUNTER — Telehealth: Payer: Self-pay | Admitting: Cardiology

## 2018-06-11 NOTE — Telephone Encounter (Signed)
New message   1. What dental office are you calling from? Dr. Lynnette Caffey  2. What is your office phone number? [phone]  3. What is your fax number?[phone]  4. What type of procedure is the patient having performed? Tooth Extraction #31  5. What date is procedure scheduled or is the patient there now? TBD   6. What is your question (ex. Antibiotics prior to procedure, holding medication-we need to know how long dentist wants pt to hold med)? Need to know how long to withhold Eliquis

## 2018-06-11 NOTE — Telephone Encounter (Signed)
   For just one tooth extraction, we generally continue anticoagulation.  If multiple teeth are being extracted, please recontact [place].  I will forward this to Dr. Lynnette Caffey.  Rosaria Ferries, PA-C 06/11/2018 5:28 PM Beeper [phone]

## 2018-06-12 NOTE — Telephone Encounter (Signed)
Call back staff:  Marland Kitchen Please see notes from Rosaria Ferries, PA-C.   Marland Kitchen Please make sure requesting surgeon received her recommendations. . This note will be removed from CV DIV Preop.  Richardson Dopp, PA-C    06/12/2018 11:06 AM

## 2018-07-08 ENCOUNTER — Other Ambulatory Visit: Payer: Self-pay | Admitting: Neurology

## 2018-07-08 MED ORDER — SERTRALINE HCL 100 MG PO TABS: 200.0000 mg | ORAL_TABLET | Freq: Every day | ORAL | 4 refills | Status: DC

## 2018-07-11 ENCOUNTER — Other Ambulatory Visit: Payer: Self-pay | Admitting: Neurology

## 2018-07-11 MED ORDER — SERTRALINE HCL 100 MG PO TABS: 200.0000 mg | ORAL_TABLET | Freq: Every day | ORAL | 4 refills | Status: DC

## 2018-07-14 IMAGING — PT NM PET BRAIN AMYLOID
1 of 4 series · 5 of 25 positions shown · non-contrast
Comparison: Brain MRI 02/20/2016

ADDENDUM:
Patient has a documented cortical infarction in the LEFT occipital
lobe from prior MRIs (7039). This infarction would explain the
decreased metabolism in the occipital lobe on the LEFT and therefore
Quirijn body dementia can be excluded from the differential diagnosis.

Findings conveyed Ibthel Gaa on 06/11/2016  at[DATE].
CLINICAL DATA: Memory loss, dementia.
EXAM:
NM PET METABOLIC BRAIN
TECHNIQUE: 10.1 mCi F-18 FDG was injected intravenously via the antecubital
fossa. Full-ring PET imaging was performed from the vertex to the
skull base. CT data was obtained and used for attenuation correction
and anatomic localization.

[Series 2: ac ct brain 3.0 h19s · axial · 3.0mm · 0.54mm/px · z∈[-62,+42]mm · 5 of 93 slices shown]
[im 14/93  brain]
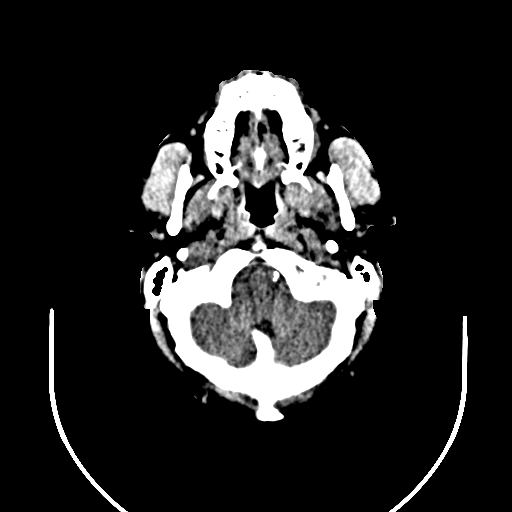
[im 27/93  brain]
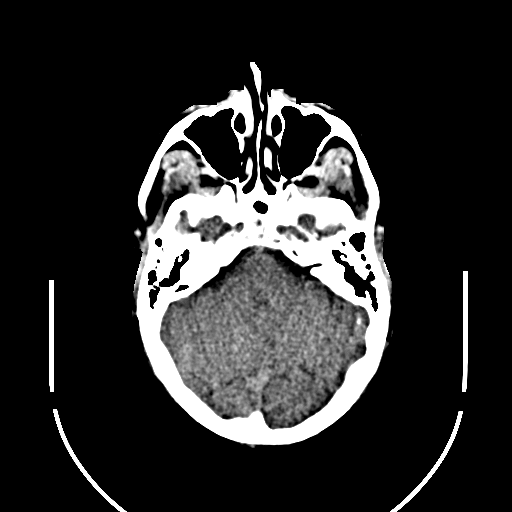
[im 40/93  brain]
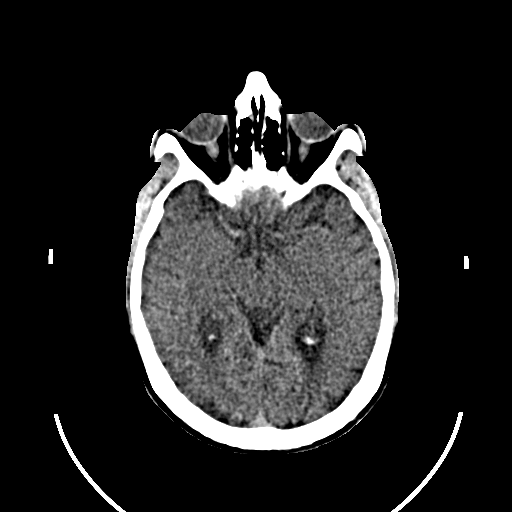
[im 53/93  brain]
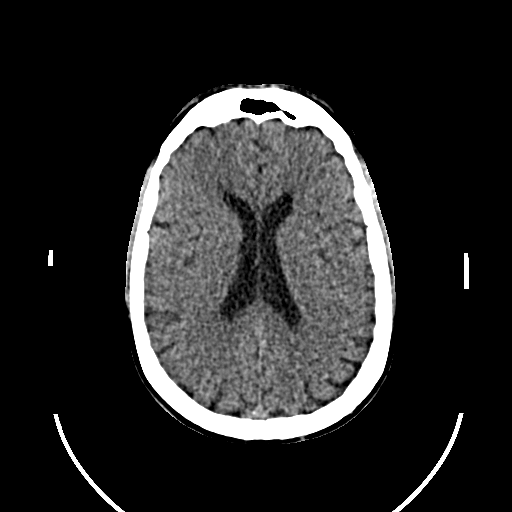
[im 66/93  brain]
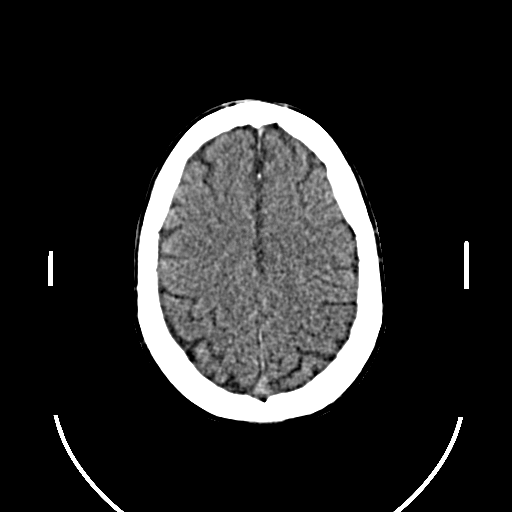

[5 of 25 positions shown; findings below may reference images not displayed]

FINDINGS: Relative decreased cortical metabolism within the LEFT and RIGHT
parietal lobes relative the frontal lobes. The decreased metabolism
is mild in the RIGHT parietal lobe and moderate to severe in the
LEFT parietal lobe. Additionally there is decreased cortical
metabolism within the LEFT occipital lobe.

Bilateral mild decreased metabolism within the temporal lobes
relative to the frontal lobes.

Fall nondiagnostic CT, noted acute findings. There is cortical
atrophy in the LEFT parietal occipital lobe with mild dilatation of
the LEFT atria. Recommend correlation with prior MRI.
IMPRESSION: 1. Decreased relative cortical metabolism within the LEFT parietal
lobe and temporal lobes and to lesser degree the RIGHT parietal
lobe. Findings are consistent with Alzheimer's type pathology.
2. Decreased relative cortical metabolism within the LEFT occipital
lobe. This finding can be associated with Quirijn body dementia.
Recommend clinical correlation with Parkinson's type dementia.
3. On the CT portion exam there is a mild volume loss within the
LEFT parietal occipital lobes with mild dilatation of ventricle.
Recommend correlation with prior MRI. Beta amyloid PET scan may add
specificity.
These results will be called to the ordering clinician or
representative by the Radiologist Assistant, and communication
documented in the PACS or zVision Dashboard.

## 2018-07-27 NOTE — Progress Notes (Signed)
Office Visit    Patient Name: Juan Johnson Date of Encounter: 07/30/2018  Primary Care Provider:  Curly Rim, MD Primary Cardiologist:  P. [place], MD   Chief Complaint    67 y/o ? with a h/o PAFlutter, HTN, HL, COPD, and prior CVA, who presents for f/u.  Past Medical History    Past Medical History:  Diagnosis Date  . Anemia   . Anxiety    claustrophobia  . Arthritis   . Complication of anesthesia    difficult to wake up after neck surgery  . COPD (chronic obstructive pulmonary disease) ([place])   . Dementia ([place])   . Depression   . Enlarged liver    20-25 years ago  . GERD (gastroesophageal reflux disease)   . Glaucoma   . Hemoptysis    a. 03/2016 in setting of PNA-->resolved.  . Hypertensive heart disease   . Mitral regurgitation    a. 09/2015 Echo: EF 55-60%, no rwma, mild to mod MR, PASP 49mmHg.  . Paroxysmal atrial flutter ([place])    a. Noted 09/2015 following back surgery-->Eliquis (CHA2DS2VASc = 4);  b. 03/2016 Recurrent PAFlutter in setting of PNA-->converted spont.  . 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 Pneumonia 04/2015   "bronchial" pneumonia   . Retinal tear of left eye   . Sleep apnea    does use cpap  . Stroke [facility]) ? 05/2014   peripheral vision loss   Past Surgical History:  Procedure Laterality Date  . BACK SURGERY  09/27/2015   [place]  . CARDIAC CATHETERIZATION  2003  . COLONOSCOPY    . COLONOSCOPY WITH PROPOFOL N/A 01/22/2018   Procedure: COLONOSCOPY WITH PROPOFOL;  Surgeon: Juanita Craver, MD;  Location: [facility] ENDOSCOPY;  Service: Endoscopy;  Laterality: N/A;  . ESOPHAGOGASTRODUODENOSCOPY (EGD) WITH PROPOFOL N/A 01/22/2018   Procedure: ESOPHAGOGASTRODUODENOSCOPY (EGD) WITH PROPOFOL;  Surgeon: Juanita Craver, MD;  Location: [facility] ENDOSCOPY;  Service: Endoscopy;  Laterality: N/A;  . EYE SURGERY  10/2007  . EYE SURGERY Bilateral 2018   Eyelid  . LEG SURGERY     AT AGE 77  . LUMBAR FUSION  09/27/2015  . NECK SURGERY   06/29/2010  . SHOULDER SURGERY  09/24/2011  . TONSILLECTOMY     AT AGE 37  . VASECTOMY      Allergies  Allergies  Allergen Reactions  . Ace Inhibitors Cough  . Crestor [Rosuvastatin] Other (See Comments)    MYALGIAS CRAMPS MUSCLE WEAKNESS  . Simvastatin Other (See Comments)    MYALGIAS CRAMPS MUSCLE WEAKNESS  . Statins     MYALGIAS CRAMPS MUSCLE WEAKNESS    History of Present Illness    67 year old male with a history of paroxysmal atrial fibrillation/flutter, hypertension, COPD, prior stroke, sleep apnea, anxiety, depression, Mild to moderate mitral regurgitation, and hyperlipidemia.  He was seen twice in 2017 related to pneumonia and hemoptysis. In that setting, he also developed recurrent A. fib flutter. When he was seen back a week later, he was back in sinus rhythm.  His beta blocker was reduced due to bradycardia.  His last Echo in February 2018 showed only mild MR and normal LV function.   He underwent L5/S1 laminectomy in early May 2018. He was readmitted one week later with HCAP. He had associated AFib with controlled rate. Treated with antibiotics. Had persistent cough for > one month. CT negative.   In December he was diagnosed with iron deficiency anemia. Hgb returned to normal  on iron therapy. He did undergo upper and lower EGD on 01/22/18. This was remarkable for multiple colon polyps, diverticulosis, and moderate antral gastritis.   In July 2019 he was seen in the ED and treated for LLL PNA.   On follow up today he is feeling well. He is seen with his wife. he has dementia and visual problems. Family is concerned about his safety. 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 Denies any palpitations, chest pain, dyspnea.   Home Medications    Prior to Admission medications   Medication Sig Start Date End Date Taking? Authorizing Provider  acetaminophen (TYLENOL) 500 MG tablet Take 500 mg by mouth every 4 (four)  hours as needed for mild pain or moderate pain (Takes 2 every 4- 6 hours).    Yes Historical Provider, MD  albuterol (PROVENTIL HFA;VENTOLIN HFA) 108 (90 Base) MCG/ACT inhaler Inhale into the lungs every 6 (six) hours as needed for wheezing or shortness of breath.   Yes Historical Provider, MD  ALPRAZolam (XANAX) 0.5 MG tablet Take 1-2 30 minutes before MRi do not drive. May repeat if needed. 02/15/16  Yes Melvenia Beam, MD  apixaban (ELIQUIS) 5 MG TABS tablet Take 1 tablet (5 mg total) by mouth 2 (two) times daily. 01/11/16  Yes Sumayyah Custodio M [place], MD  BREO ELLIPTA 100-25 MCG/INH AEPB Take 1 puff by mouth daily. 02/13/16  Yes Historical Provider, MD  Cetirizine HCl (ZYRTEC PO) Take 10 mg by mouth daily.    Yes Historical Provider, MD  dextromethorphan-guaiFENesin (MUCINEX DM) 30-600 MG 12hr tablet Take 1 tablet by mouth 2 (two) times daily as needed for cough.    Yes Historical Provider, MD  diltiazem (CARDIZEM CD) 240 MG 24 hr capsule Take 1 capsule (240 mg total) by mouth daily. 01/30/16  Yes Jewel Venditto M [place], MD  donepezil (ARICEPT) 10 MG tablet Take 0.5 tablets by mouth daily. 05/29/16  Yes Historical Provider, MD  ezetimibe (ZETIA) 10 MG tablet Take 10 mg by mouth daily.   Yes Historical Provider, MD  ketorolac (ACULAR) 0.5 % ophthalmic solution Place 1 drop into the left eye 4 (four) times daily.  08/22/14  Yes Historical Provider, MD  latanoprost (XALATAN) 0.005 % ophthalmic solution Place 1 drop into both eyes at bedtime.    Yes Historical Provider, MD  metoprolol tartrate (LOPRESSOR) 25 MG tablet Take 0.5 tablets (12.5 mg total) by mouth 2 (two) times daily. 02/15/16  Yes Aidee Latimore M [place], MD  montelukast (SINGULAIR) 10 MG tablet Take 10 mg by mouth at bedtime.  03/09/15  Yes Historical Provider, MD  Multiple Vitamins-Minerals (MULTIVITAMIN WITH MINERALS) tablet Take 1 tablet by mouth daily.   Yes Historical Provider, MD  niacin (NIASPAN) 1000 MG CR tablet Take 1,000 mg by mouth daily. 03/09/15  Yes  Historical Provider, MD  omeprazole (PRILOSEC) 20 MG capsule Take 20 mg by mouth 2 (two) times daily before a meal.  03/09/15  Yes Historical Provider, MD  sertraline (ZOLOFT) 50 MG tablet Take 1 tablet (50 mg total) by mouth daily. 06/03/16  Yes Melvenia Beam, MD  valsartan-hydrochlorothiazide (DIOVAN-HCT) 160-12.5 MG tablet Take 1 tablet by mouth daily. 01/29/16  Yes Ziare Orrick M [place], MD    Review of Systems    As noted above.   All other systems reviewed and are otherwise negative except as noted above.  Physical Exam    VS:  BP 122/64   Pulse (!) 51   Ht  5\' 5"  (1.651 m)   Wt 224 lb 9.6 oz (101.9 kg)   BMI 37.38 kg/m  , BMI Body mass index is 37.38 kg/m. GENERAL:  Well appearing, obese WM in NAD HEENT:  PERRL, EOMI, sclera are clear. Oropharynx is clear. NECK:  No jugular venous distention, carotid upstroke brisk and symmetric, no bruits, no thyromegaly or adenopathy LUNGS:  Clear to auscultation bilaterally CHEST:  Unremarkable HEART:  RRR,  PMI not displaced or sustained,S1 and S2 within normal limits, no S3, no S4: no clicks, no rubs, no murmurs ABD:  Soft, nontender. BS +, no masses or bruits. No hepatomegaly, no splenomegaly EXT:  2 + pulses throughout, no edema, no cyanosis no clubbing SKIN:  Warm and dry.  No rashes NEURO:  Alert and oriented x 3. Cranial nerves II through XII intact. PSYCH:  Cognitively intact      Accessory Clinical Findings    Lab Results  Component Value Date   WBC 16.9 (H) 04/20/2018   HGB 12.6 (L) 04/20/2018   HCT 38.7 (L) 04/20/2018   PLT 321 04/20/2018   GLUCOSE 115 (H) 04/20/2018   CHOL  05/27/2010    172        ATP III CLASSIFICATION:  <200     mg/dL   Desirable  200-239  mg/dL   Borderline High  >=240    mg/dL   High          TRIG 206 (H) 05/27/2010   HDL 34 (L) 05/27/2010   LDLCALC  05/27/2010    97        Total Cholesterol/HDL:CHD Risk Coronary Heart Disease Risk Table                     Men   Women  1/2 Average Risk    3.4   3.3  Average Risk       5.0   4.4  2 X Average Risk   9.6   7.1  3 X Average Risk  23.4   11.0        Use the calculated Patient Ratio above and the CHD Risk Table to determine the patient's CHD Risk.        ATP III CLASSIFICATION (LDL):  <100     mg/dL   Optimal  100-129  mg/dL   Near or Above                    Optimal  130-159  mg/dL   Borderline  160-189  mg/dL   High  >190     mg/dL   Very High   ALT 19 04/20/2018   AST 22 04/20/2018   NA 137 04/20/2018   K 3.5 04/20/2018   CL 104 04/20/2018   CREATININE 0.94 04/20/2018   BUN 16 04/20/2018   CO2 23 04/20/2018   TSH 0.651 10/03/2015   INR 1.18 02/16/2017   HGBA1C 6.0 (H) 06/24/2014   Labs dated 09/12/16: glucose 103, UA normal. Hgb 12.0. Normal CMET. Hep C antibody neg. Cholesterol 200, triglycerides 131, HDL 42, LDL 132.  Dated 09/15/17: cholesterol 201, triglycerides 87, HDL 42, LDL 142.  Dated 12/18/17: normal CMET and CBC.  Echo 12/05/16:Study Conclusions  - Left ventricle: The cavity size was mildly dilated. Wall   thickness was normal. Systolic function was normal. The estimated   ejection fraction was in the range of 50% to 55%. Wall motion was   normal; there were no regional wall motion abnormalities. Left  ventricular diastolic function parameters were normal. - Mitral valve: There was mild regurgitation. - Left atrium: The atrium was mildly dilated. - Pulmonary arteries: Systolic pressure was mildly increased. PA   peak pressure: 32 mm Hg (S).  Impressions:  - Normal LV systolic and diastolic function; mild LVE; mild MR;   mild LAE; trace TR; mildly elevated pulmonary pressure.   Assessment & Plan    1.  Paroxysmal atrial fibrillation/flutter: previously triggered by fever and PNA.  He remains in sinus rhythm. Will continue rate control strategy with  Cardizem. Beta blocker discontinued previously due to bradycardia.  Continue Eliquis for anticoagulation.  2. Hypertensive heart disease: Blood  pressure is well controlled on current therapy.    3. Hyperlipidemia: He is treated with Zetia and niacin.  Lipids have been managed by primary care. Intolerant of statins.  4. Mild mitral regurgitation by Echo. Asymptomatic.  5. Disposition: follow up in 6 months  Alekhya Gravlin [place], [place] 07/30/2018, 9:38 AM

## 2018-07-30 ENCOUNTER — Encounter: Payer: Self-pay | Admitting: Cardiology

## 2018-07-30 ENCOUNTER — Ambulatory Visit (INDEPENDENT_AMBULATORY_CARE_PROVIDER_SITE_OTHER): Payer: Medicare Other | Admitting: Cardiology

## 2018-07-30 VITALS — BP 122/64 | HR 51 | Ht 65.0 in | Wt 224.6 lb

## 2018-07-30 DIAGNOSIS — I4892 Unspecified atrial flutter: Secondary | ICD-10-CM

## 2018-07-30 DIAGNOSIS — I10 Essential (primary) hypertension: Secondary | ICD-10-CM

## 2018-07-30 DIAGNOSIS — E7800 Pure hypercholesterolemia, unspecified: Secondary | ICD-10-CM | POA: Diagnosis not present

## 2018-10-02 ENCOUNTER — Emergency Department (HOSPITAL_COMMUNITY): Payer: Medicare Other

## 2018-10-02 ENCOUNTER — Other Ambulatory Visit: Payer: Self-pay

## 2018-10-02 ENCOUNTER — Observation Stay (HOSPITAL_COMMUNITY)
Admission: EM | Admit: 2018-10-02 | Discharge: 2018-10-03 | Disposition: A | Discharge status: Home / Self Care | Payer: Medicare Other | Attending: Internal Medicine | Admitting: Internal Medicine

## 2018-10-02 ENCOUNTER — Encounter (HOSPITAL_COMMUNITY): Payer: Self-pay | Admitting: Emergency Medicine

## 2018-10-02 DIAGNOSIS — Z7901 Long term (current) use of anticoagulants: Secondary | ICD-10-CM | POA: Diagnosis not present

## 2018-10-02 DIAGNOSIS — G9341 Metabolic encephalopathy: Secondary | ICD-10-CM | POA: Diagnosis present

## 2018-10-02 DIAGNOSIS — J440 Chronic obstructive pulmonary disease with acute lower respiratory infection: Secondary | ICD-10-CM | POA: Insufficient documentation

## 2018-10-02 DIAGNOSIS — J189 Pneumonia, unspecified organism: Secondary | ICD-10-CM | POA: Diagnosis present

## 2018-10-02 DIAGNOSIS — J101 Influenza due to other identified influenza virus with other respiratory manifestations: Secondary | ICD-10-CM | POA: Diagnosis present

## 2018-10-02 DIAGNOSIS — R410 Disorientation, unspecified: Secondary | ICD-10-CM | POA: Diagnosis present

## 2018-10-02 DIAGNOSIS — I4891 Unspecified atrial fibrillation: Principal | ICD-10-CM | POA: Diagnosis present

## 2018-10-02 DIAGNOSIS — J1008 Influenza due to other identified influenza virus with other specified pneumonia: Secondary | ICD-10-CM | POA: Diagnosis not present

## 2018-10-02 DIAGNOSIS — J111 Influenza due to unidentified influenza virus with other respiratory manifestations: Secondary | ICD-10-CM

## 2018-10-02 DIAGNOSIS — J449 Chronic obstructive pulmonary disease, unspecified: Secondary | ICD-10-CM | POA: Diagnosis present

## 2018-10-02 DIAGNOSIS — Z87891 Personal history of nicotine dependence: Secondary | ICD-10-CM | POA: Diagnosis not present

## 2018-10-02 DIAGNOSIS — Z888 Allergy status to other drugs, medicaments and biological substances status: Secondary | ICD-10-CM | POA: Diagnosis not present

## 2018-10-02 DIAGNOSIS — J181 Lobar pneumonia, unspecified organism: Secondary | ICD-10-CM | POA: Insufficient documentation

## 2018-10-02 DIAGNOSIS — I10 Essential (primary) hypertension: Secondary | ICD-10-CM | POA: Diagnosis not present

## 2018-10-02 DIAGNOSIS — Z791 Long term (current) use of non-steroidal anti-inflammatories (NSAID): Secondary | ICD-10-CM | POA: Diagnosis not present

## 2018-10-02 DIAGNOSIS — Z79899 Other long term (current) drug therapy: Secondary | ICD-10-CM | POA: Insufficient documentation

## 2018-10-02 DIAGNOSIS — F0390 Unspecified dementia without behavioral disturbance: Secondary | ICD-10-CM | POA: Diagnosis present

## 2018-10-02 LAB — BASIC METABOLIC PANEL
ANION GAP: 9 (ref 5–15)
BUN: 13 mg/dL (ref 8–23)
CO2: 22 mmol/L (ref 22–32)
Calcium: 9 mg/dL (ref 8.9–10.3)
Chloride: 103 mmol/L (ref 98–111)
Creatinine, Ser: 0.92 mg/dL (ref 0.61–1.24)
GFR calc Af Amer: >60 mL/min (ref >60)
GFR calc non Af Amer: >60 mL/min (ref >60)
GLUCOSE: 117 mg/dL — AB (ref 70–99)
Potassium: 3.8 mmol/L (ref 3.5–5.1)
Sodium: 134 mmol/L — ABNORMAL LOW (ref 135–145)

## 2018-10-02 LAB — CBC WITH DIFFERENTIAL/PLATELET
Abs Immature Granulocytes: 0.07 10*3/uL (ref 0.00–0.07)
Basophils Absolute: 0.1 10*3/uL (ref 0.0–0.1)
Basophils Relative: 0 %
Eosinophils Absolute: 0.2 10*3/uL (ref 0.0–0.5)
Eosinophils Relative: 1 %
HCT: 41.6 % (ref 39.0–52.0)
Hemoglobin: 13.2 g/dL (ref 13.0–17.0)
Immature Granulocytes: 0 %
Lymphocytes Relative: 13 %
Lymphs Abs: 2 10*3/uL (ref 0.7–4.0)
MCH: 29.7 pg (ref 26.0–34.0)
MCHC: 31.7 g/dL (ref 30.0–36.0)
MCV: 93.5 fL (ref 80.0–100.0)
Monocytes Absolute: 1.6 10*3/uL — ABNORMAL HIGH (ref 0.1–1.0)
Monocytes Relative: 10 %
Neutro Abs: 12.1 10*3/uL — ABNORMAL HIGH (ref 1.7–7.7)
Neutrophils Relative %: 76 %
Platelets: 228 10*3/uL (ref 150–400)
RBC: 4.45 MIL/uL (ref 4.22–5.81)
RDW: 12.2 % (ref 11.5–15.5)
WBC: 16.1 10*3/uL — ABNORMAL HIGH (ref 4.0–10.5)
nRBC: 0 % (ref 0.0–0.2)

## 2018-10-02 LAB — LACTIC ACID, PLASMA: Lactic Acid, Venous: 0.9 mmol/L (ref 0.5–1.9)

## 2018-10-02 MED ORDER — DORZOLAMIDE HCL-TIMOLOL MAL 2-0.5 % OP SOLN
1.0000 [drp] | Freq: Two times a day (BID) | OPHTHALMIC | Status: DC
  Administered 2018-10-03 (×2): 1 [drp] via OPHTHALMIC
  Filled 2018-10-02 (×2): qty 10

## 2018-10-02 MED ORDER — OSELTAMIVIR PHOSPHATE 75 MG PO CAPS
75.0000 mg | ORAL_CAPSULE | Freq: Two times a day (BID) | ORAL | Status: DC
  Administered 2018-10-03 (×2): 75 mg via ORAL
  Filled 2018-10-02 (×2): qty 1

## 2018-10-02 MED ORDER — MEMANTINE HCL 10 MG PO TABS
10.0000 mg | ORAL_TABLET | Freq: Two times a day (BID) | ORAL | Status: DC
  Administered 2018-10-03 (×2): 10 mg via ORAL
  Filled 2018-10-02 (×7): qty 1

## 2018-10-02 MED ORDER — SODIUM CHLORIDE 0.9 % IV SOLN
INTRAVENOUS | Status: DC
  Administered 2018-10-02 – 2018-10-03 (×2): via INTRAVENOUS

## 2018-10-02 MED ORDER — DONEPEZIL HCL 5 MG PO TABS
5.0000 mg | ORAL_TABLET | Freq: Every day | ORAL | Status: DC
  Administered 2018-10-03: 5 mg via ORAL
  Filled 2018-10-02 (×3): qty 1

## 2018-10-02 MED ORDER — LATANOPROST 0.005 % OP SOLN
1.0000 [drp] | Freq: Every day | OPHTHALMIC | Status: DC
  Administered 2018-10-03: 1 [drp] via OPHTHALMIC
  Filled 2018-10-02 (×2): qty 2.5

## 2018-10-02 MED ORDER — EZETIMIBE 10 MG PO TABS
10.0000 mg | ORAL_TABLET | Freq: Every day | ORAL | Status: DC
  Administered 2018-10-03: 10 mg via ORAL
  Filled 2018-10-02 (×3): qty 1

## 2018-10-02 MED ORDER — SODIUM CHLORIDE 0.9 % IV SOLN
500.0000 mg | Freq: Once | INTRAVENOUS | Status: AC
  Administered 2018-10-02: 500 mg via INTRAVENOUS
  Filled 2018-10-02: qty 500

## 2018-10-02 MED ORDER — DILTIAZEM HCL ER COATED BEADS 240 MG PO CP24
240.0000 mg | ORAL_CAPSULE | Freq: Every day | ORAL | Status: DC
  Administered 2018-10-03: 240 mg via ORAL
  Filled 2018-10-02 (×3): qty 1

## 2018-10-02 MED ORDER — TIMOLOL HEMIHYDRATE 0.5 % OP SOLN: 1.0000 [drp] | Freq: Two times a day (BID) | OPHTHALMIC | Status: DC

## 2018-10-02 MED ORDER — ACETAMINOPHEN 500 MG PO TABS: 1000.0000 mg | ORAL_TABLET | ORAL | Status: DC | PRN

## 2018-10-02 MED ORDER — KETOROLAC TROMETHAMINE 0.5 % OP SOLN
1.0000 [drp] | Freq: Four times a day (QID) | OPHTHALMIC | Status: DC
  Administered 2018-10-03 (×2): 1 [drp] via OPHTHALMIC
  Filled 2018-10-02: qty 5

## 2018-10-02 MED ORDER — SODIUM CHLORIDE 0.9 % IV BOLUS
500.0000 mL | Freq: Once | INTRAVENOUS | Status: AC
  Administered 2018-10-02: 500 mL via INTRAVENOUS

## 2018-10-02 MED ORDER — SODIUM CHLORIDE 0.9 % IV SOLN: 500.0000 mg | INTRAVENOUS | Status: DC

## 2018-10-02 MED ORDER — ALBUTEROL SULFATE (2.5 MG/3ML) 0.083% IN NEBU: 2.5000 mg | INHALATION_SOLUTION | Freq: Four times a day (QID) | RESPIRATORY_TRACT | Status: DC | PRN

## 2018-10-02 MED ORDER — LOSARTAN POTASSIUM 25 MG PO TABS
100.0000 mg | ORAL_TABLET | Freq: Every day | ORAL | Status: DC
  Administered 2018-10-03: 100 mg via ORAL
  Filled 2018-10-02: qty 4

## 2018-10-02 MED ORDER — SERTRALINE HCL 50 MG PO TABS
200.0000 mg | ORAL_TABLET | Freq: Every day | ORAL | Status: DC
  Administered 2018-10-03: 200 mg via ORAL
  Filled 2018-10-02: qty 4

## 2018-10-02 MED ORDER — APIXABAN 5 MG PO TABS
5.0000 mg | ORAL_TABLET | Freq: Two times a day (BID) | ORAL | Status: DC
  Administered 2018-10-03 (×2): 5 mg via ORAL
  Filled 2018-10-02 (×2): qty 1

## 2018-10-02 MED ORDER — ADULT MULTIVITAMIN W/MINERALS CH
1.0000 | ORAL_TABLET | Freq: Every day | ORAL | Status: DC
  Administered 2018-10-03: 1 via ORAL
  Filled 2018-10-02: qty 1

## 2018-10-02 MED ORDER — SODIUM CHLORIDE 0.9 % IV SOLN: 1.0000 g | INTRAVENOUS | Status: DC

## 2018-10-02 MED ORDER — NIACIN ER (ANTIHYPERLIPIDEMIC) 500 MG PO TBCR
1000.0000 mg | EXTENDED_RELEASE_TABLET | Freq: Every day | ORAL | Status: DC
  Administered 2018-10-03: 1000 mg via ORAL
  Filled 2018-10-02 (×3): qty 2

## 2018-10-02 MED ORDER — SODIUM CHLORIDE 0.9 % IV SOLN
1.0000 g | Freq: Once | INTRAVENOUS | Status: AC
  Administered 2018-10-02: 1 g via INTRAVENOUS
  Filled 2018-10-02: qty 10

## 2018-10-02 MED ORDER — MONTELUKAST SODIUM 10 MG PO TABS
10.0000 mg | ORAL_TABLET | Freq: Every day | ORAL | Status: DC
  Administered 2018-10-03: 10 mg via ORAL
  Filled 2018-10-02: qty 1

## 2018-10-02 NOTE — ED Notes (Signed)
Unable to get urine sample at this time.

## 2018-10-02 NOTE — ED Triage Notes (Signed)
Family member states family was diagnosed with influenza B yesterday and given tamiflu. States today she checked his heart rate and thinks he's in A-fib.

## 2018-10-02 NOTE — H&P (Signed)
History and Physical    Juan Johnson JJO:[id number] DOB: [birth year] DOA: 10/02/2018  PCP: Curly Rim, MD  Patient coming from: home  Chief Complaint: confusion  HPI: Juan Johnson is a 67 y.o. male with medical history significant of moderate dementia, anxiety, copd, afib comes in with worsening confusion.  Wife present and provides all history pt cannot due to dementia.  He was diagnosed with influenza yesterday and started on tamiflu by pcp.  His temp was over 101 and was very confused.  He has continued to be confused and not better therefore wife brought to ED also when she noticed his HR was up.  Pt found to be with a rapid rate over 120 and given ivf in the ED and his rate is improved from 100-110 range.  Temp nml.  Pt found to have pna on cxr also, referred for admission for his afib with rvr.  Review of Systems: As per HPI otherwise 10 point review of systems negative per wife  Past Medical History:  Diagnosis Date  . Anemia   . Anxiety    claustrophobia  . Arthritis   . Complication of anesthesia    difficult to wake up after neck surgery  . COPD (chronic obstructive pulmonary disease) ([place])   . Dementia ([place])   . Depression   . Enlarged liver    20-25 years ago  . GERD (gastroesophageal reflux disease)   . Glaucoma   . Hemoptysis    a. 03/2016 in setting of PNA-->resolved.  . Hypertensive heart disease   . Mitral regurgitation    a. 09/2015 Echo: EF 55-60%, no rwma, mild to mod MR, PASP 55mmHg.  . Paroxysmal atrial flutter ([place])    a. Noted 09/2015 following back surgery-->Eliquis (CHA2DS2VASc = 4);  b. 03/2016 Recurrent PAFlutter in setting of PNA-->converted spont.  . 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 Pneumonia 04/2015   "bronchial" pneumonia   . Retinal tear of left eye   . Sleep apnea    does use cpap  . Stroke [facility]) ? 05/2014   peripheral vision loss    Past Surgical History:  Procedure Laterality Date  . BACK SURGERY   09/27/2015   [place]  . CARDIAC CATHETERIZATION  2003  . COLONOSCOPY    . COLONOSCOPY WITH PROPOFOL N/A 01/22/2018   Procedure: COLONOSCOPY WITH PROPOFOL;  Surgeon: Juanita Craver, MD;  Location: [facility];  Service: Endoscopy;  Laterality: N/A;  . ESOPHAGOGASTRODUODENOSCOPY (EGD) WITH PROPOFOL N/A 01/22/2018   Procedure: ESOPHAGOGASTRODUODENOSCOPY (EGD) WITH PROPOFOL;  Surgeon: Juanita Craver, MD;  Location: [facility];  Service: Endoscopy;  Laterality: N/A;  . EYE SURGERY  10/2007  . EYE SURGERY Bilateral 2018   Eyelid  . LEG SURGERY     AT AGE 59  . LUMBAR FUSION  09/27/2015  . NECK SURGERY  06/29/2010  . SHOULDER SURGERY  09/24/2011  . TONSILLECTOMY     AT AGE 67  . VASECTOMY       reports that he quit smoking about 20 years ago. His smoking use included cigarettes. He has a 69.00 pack-year smoking history. He has quit using smokeless tobacco.  His smokeless tobacco use included chew. He reports current alcohol use. He reports that he does not use drugs.  Allergies  Allergen Reactions  . Ace Inhibitors Cough  . Crestor [Rosuvastatin] Other (See Comments)    MYALGIAS CRAMPS MUSCLE WEAKNESS  . Simvastatin Other (See Comments)    MYALGIAS  CRAMPS MUSCLE WEAKNESS  . Statins     MYALGIAS CRAMPS MUSCLE WEAKNESS    Family History  Problem Relation Age of Onset  . Hypertension Mother   . Macular degeneration Mother   . Stroke Father   . Emphysema Father        smoked  . Diabetes Other   . Hypothyroidism Other   . Cancer Other   . Diabetes type II Brother   . Diabetes type II Brother   . Hyperthyroidism Other   . Cataracts Other   . Glaucoma Other     Prior to Admission medications   Medication Sig Start Date End Date Taking? Authorizing Provider  acetaminophen (TYLENOL) 500 MG tablet Take 1,000 mg by mouth every 4 (four) hours as needed for mild pain or moderate pain (Takes 2 every 4- 6 hours).    Yes [provider]  albuterol (PROVENTIL) (2.5 MG/3ML)  0.083% nebulizer solution Take 3 mLs (2.5 mg total) by nebulization every 6 (six) hours as needed for wheezing or shortness of breath. 04/20/18  Yes Nat Christen, MD  CARTIA XT 240 MG 24 hr capsule TAKE 1 CAPSULE BY MOUTH ONCE DAILY 01/09/18  Yes [place], Peter M, MD  cetirizine (ZYRTEC) 10 MG tablet Take 10 mg by mouth daily.   Yes [provider]  donepezil (ARICEPT) 5 MG tablet Take 1 tablet (5 mg total) by mouth daily. 08/12/17  Yes Melvenia Beam, MD  dorzolamide-timolol (COSOPT) 22.3-6.8 MG/ML ophthalmic solution Place 1 drop into the left eye 2 (two) times daily.   Yes [provider]  ELIQUIS 5 MG TABS tablet TAKE 1 TABLET BY MOUTH TWICE DAILY 02/02/18  Yes [place], Peter M, MD  ezetimibe (ZETIA) 10 MG tablet Take 10 mg by mouth daily.   Yes [provider]  ketorolac (ACULAR) 0.5 % ophthalmic solution Place 1 drop into the left eye 4 (four) times daily.  08/22/14  Yes [provider]  latanoprost (XALATAN) 0.005 % ophthalmic solution Place 1 drop into both eyes at bedtime.    Yes [provider]  losartan (COZAAR) 100 MG tablet Take 1 tablet (100 mg total) by mouth daily. 01/28/18 01/23/19 Yes [place], Peter M, MD  memantine (NAMENDA) 10 MG tablet Take 1 tablet (10 mg total) by mouth 2 (two) times daily. 08/12/17  Yes Melvenia Beam, MD  montelukast (SINGULAIR) 10 MG tablet Take 10 mg by mouth at bedtime.  03/09/15  Yes [provider]  Multiple Vitamins-Minerals (MULTIVITAMIN WITH MINERALS) tablet Take 1 tablet by mouth daily.   Yes [provider]  niacin (NIASPAN) 1000 MG CR tablet Take 1,000 mg by mouth at bedtime.  03/09/15  Yes [provider]  omeprazole (PRILOSEC) 20 MG capsule Take 20 mg by mouth 2 (two) times daily before a meal.  03/09/15  Yes [provider]  oseltamivir (TAMIFLU) 75 MG capsule Take 75 mg by mouth 2 (two) times daily.   Yes [provider]  sertraline (ZOLOFT) 100 MG tablet Take 2  tablets (200 mg total) by mouth at bedtime. 07/11/18  Yes Melvenia Beam, MD  SYMBICORT 160-4.5 MCG/ACT inhaler Take 2 puffs by mouth 2 (two) times daily. 11/03/17  Yes [provider]  timolol (BETIMOL) 0.5 % ophthalmic solution Place 1 drop into the left eye 2 (two) times daily.   Yes [provider]    Physical Exam: Vitals:   10/02/18 1903 10/02/18 1904 10/02/18 2000  BP: (!) 139/109  (!) 142/74  Pulse: (!) 121    Resp: 18  16  Temp: 98.1 F (36.7 C)    TempSrc: Oral    SpO2: 95%    Weight:  97.5 kg   Height:  5\' 9"  (1.753 m)       Constitutional: NAD, calm, comfortable pleasantly confused Vitals:   10/02/18 1903 10/02/18 1904 10/02/18 2000  BP: (!) 139/109  (!) 142/74  Pulse: (!) 121    Resp: 18  16  Temp: 98.1 F (36.7 C)    TempSrc: Oral    SpO2: 95%    Weight:  97.5 kg   Height:  5\' 9"  (1.753 m)    Eyes: PERRL, lids and conjunctivae normal ENMT: Mucous membranes are moist. Posterior pharynx clear of any exudate or lesions.Normal dentition.  Neck: normal, supple, no masses, no thyromegaly Respiratory: clear to auscultation bilaterally, no wheezing, no crackles. Normal respiratory effort. No accessory muscle use.  Cardiovascular: Regular rate and rhythm, no murmurs / rubs / gallops. No extremity edema. 2+ pedal pulses. No carotid bruits.  Abdomen: no tenderness, no masses palpated. No hepatosplenomegaly. Bowel sounds positive.  Musculoskeletal: no clubbing / cyanosis. No joint deformity upper and lower extremities. Good ROM, no contractures. Normal muscle tone.  Skin: no rashes, lesions, ulcers. No induration Neurologic: CN 2-12 grossly intact. Sensation intact, DTR normal. Strength 5/5 in all 4.  Psychiatric: Normal judgment and insight. Alert and oriented x 3. Normal mood.    Labs on Admission: I have personally reviewed following labs and imaging studies  CBC: Recent Labs  Lab 10/02/18 2042  WBC 16.1*  NEUTROABS 12.1*  HGB 13.2  HCT  41.6  MCV 93.5  PLT 443   Basic Metabolic Panel: Recent Labs  Lab 10/02/18 2042  NA 134*  K 3.8  CL 103  CO2 22  GLUCOSE 117*  BUN 13  CREATININE 0.92  CALCIUM 9.0   GFR: Estimated Creatinine Clearance: 87.2 mL/min (by C-G formula based on SCr of 0.92 mg/dL). Liver Function Tests: No results for input(s): AST, ALT, ALKPHOS, BILITOT, PROT, ALBUMIN in the last 168 hours. No results for input(s): LIPASE, AMYLASE in the last 168 hours. No results for input(s): AMMONIA in the last 168 hours. Coagulation Profile: No results for input(s): INR, PROTIME in the last 168 hours. Cardiac Enzymes: No results for input(s): CKTOTAL, CKMB, CKMBINDEX, TROPONINI in the last 168 hours. BNP (last 3 results) No results for input(s): PROBNP in the last 8760 hours. HbA1C: No results for input(s): HGBA1C in the last 72 hours. CBG: No results for input(s): GLUCAP in the last 168 hours. Lipid Profile: No results for input(s): CHOL, HDL, LDLCALC, TRIG, CHOLHDL, LDLDIRECT in the last 72 hours. Thyroid Function Tests: No results for input(s): TSH, T4TOTAL, FREET4, T3FREE, THYROIDAB in the last 72 hours. Anemia Panel: No results for input(s): VITAMINB12, FOLATE, FERRITIN, TIBC, IRON, RETICCTPCT in the last 72 hours. Urine analysis:    Component Value Date/Time   COLORURINE YELLOW 02/17/2017 0028   APPEARANCEUR HAZY (A) 02/17/2017 0028   LABSPEC 1.025 02/17/2017 0028   PHURINE 5.0 02/17/2017 0028   GLUCOSEU NEGATIVE 02/17/2017 0028   HGBUR NEGATIVE 02/17/2017 0028   BILIRUBINUR NEGATIVE 02/17/2017 0028   KETONESUR 5 (A) 02/17/2017 0028   PROTEINUR 100 (A) 02/17/2017 0028   UROBILINOGEN 0.2 05/26/2010 0959   NITRITE NEGATIVE 02/17/2017 0028   LEUKOCYTESUR NEGATIVE 02/17/2017 0028   Sepsis Labs: !!!!!!!!!!!!!!!!!!!!!!!!!!!!!!!!!!!!!!!!!!!! @LABRCNTIP (procalcitonin:4,lacticidven:4) ) Recent Results (from the past 240 hour(s))  Culture, blood (Routine X 2) w Reflex to  ID Panel     Status: None  (Preliminary result)   Collection Time: 10/02/18  9:50 PM  Result Value Ref Range Status   Specimen Description RIGHT ANTECUBITAL  Final   Special Requests   Final    BOTTLES DRAWN AEROBIC AND ANAEROBIC Blood Culture adequate volume Performed at [facility], [address], [place], [place] [address]    Culture PENDING  Incomplete   Report Status PENDING  Incomplete  Culture, blood (Routine X 2) w Reflex to ID Panel     Status: None (Preliminary result)   Collection Time: 10/02/18  9:55 PM  Result Value Ref Range Status   Specimen Description BLOOD LEFT ARM  Final   Special Requests   Final    BOTTLES DRAWN AEROBIC AND ANAEROBIC Blood Culture adequate volume Performed at [facility], [address], [place], [place] [address]    Culture PENDING  Incomplete   Report Status PENDING  Incomplete     Radiological Exams on Admission: Dg Chest 2 View  Result Date: 10/02/2018 CLINICAL DATA:  Acute onset of palpitations and tachycardia. Question of atrial fibrillation. EXAM: CHEST - 2 VIEW COMPARISON:  Chest radiograph performed 04/20/2018 FINDINGS: The lungs are well-aerated. Focal right middle lobe airspace opacity is concerning for pneumonia. There is no evidence of pleural effusion or pneumothorax. The heart is normal in size; the mediastinal contour is within normal limits. No acute osseous abnormalities are seen. Cervical spinal fusion hardware is partially imaged. IMPRESSION: Focal right middle lobe pneumonia noted. Electronically Signed   By: Garald Balding M.D.   On: 10/02/2018 21:26    EKG: Independently reviewed. afib Old chart reviewed Case discussed with edp cxr reviewed rml infiltrate  Assessment/Plan 67 yo male with influenza and pna comes in with afib w rvr  Principal Problem:   A-fib ([place])- cont ivf.  Rate up secondary to acute infection.  Treat infection.  Monitor HR.  Cont home ccb for now.  Active Problems:   PNA (pneumonia)- oxygen sats normal.  Place on rocephin  and azithro    Acute metabolic encephalopathy- worse than his baseline per wife, due to infection, no focal deficits    Essential hypertension- cont home meds    COPD GOLD I- stable at this time    Dementia without behavioral disturbance ([place])- noted    Influenza A- cont tamiflu  Home med rec pending pharm review     DVT prophylaxis: eloquis Code Status:  full Family Communication: wife Disposition Plan:  tomorrow Consults called:  none Admission status:  observation   Eura Mccauslin A MD [facility]  If 7PM-7AM, please contact night-coverage [email or web address] Password [facility]  10/02/2018, 11:09 PM

## 2018-10-02 NOTE — ED Provider Notes (Signed)
[facility] EMERGENCY DEPARTMENT Provider Note   CSN: [id number] Arrival date & time: 10/02/18  1857     History   Chief Complaint Chief Complaint  Patient presents with  . Influenza  . Atrial Fibrillation    HPI Juan Johnson is a 67 y.o. male.  HPI  Past Medical History:  Diagnosis Date  . Anemia   . Anxiety    claustrophobia  . Arthritis   . Complication of anesthesia    difficult to wake up after neck surgery  . COPD (chronic obstructive pulmonary disease) ([place])   . Dementia ([place])   . Depression   . Enlarged liver    20-25 years ago  . GERD (gastroesophageal reflux disease)   . Glaucoma   . Hemoptysis    a. 03/2016 in setting of PNA-->resolved.  . Hypertensive heart disease   . Mitral regurgitation    a. 09/2015 Echo: EF 55-60%, no rwma, mild to mod MR, PASP 75mmHg.  . Paroxysmal atrial flutter ([place])    a. Noted 09/2015 following back surgery-->Eliquis (CHA2DS2VASc = 4);  b. 03/2016 Recurrent PAFlutter in setting of PNA-->converted spont.  . 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 Pneumonia 04/2015   "bronchial" pneumonia   . Retinal tear of left eye   . Sleep apnea    does use cpap  . Stroke [facility]) ? 05/2014   peripheral vision loss    Patient Active Problem List   Diagnosis Date Noted  . GERD (gastroesophageal reflux disease) 02/17/2017  . OSA (obstructive sleep apnea) 02/17/2017  . HCAP (healthcare-associated pneumonia) 02/17/2017  . Sepsis ([place]) 02/17/2017  . Spondylolisthesis at L5-S1 level 02/14/2017  . Hypertensive heart disease   . Paroxysmal atrial flutter ([place])   . Dementia without behavioral disturbance ([place]) 06/04/2016  . Dementia with behavioral disturbance ([place]) 04/18/2016  . Atrial flutter ([place]) 01/11/2016  . Mitral valve regurgitation: mild to moderate 10/23/2015  . New onset atrial flutter ([place]) 10/04/2015  . Atrial fibrillation with rapid ventricular response ([place])   . Anemia 10/03/2015  . Stroke ([place])   .  Spondylolisthesis of lumbar region 09/27/2015  . Lobar pneumonia due to unspecified organism 05/25/2015  . Snoring 03/16/2015  . Witnessed apneic spells 03/16/2015  . Obese 03/16/2015  . Morning headache 03/16/2015  . Excessive daytime sleepiness 03/16/2015  . COPD GOLD I 09/25/2014  . Upper airway cough syndrome vs cough variant asthma 08/22/2014  . Essential hypertension 08/22/2014  . Diplopia 06/25/2014  . Vision loss, bilateral 06/25/2014  . Weakness 06/25/2014  . Unspecified hereditary and idiopathic peripheral neuropathy 06/25/2014    Past Surgical History:  Procedure Laterality Date  . BACK SURGERY  09/27/2015   [place]  . CARDIAC CATHETERIZATION  2003  . COLONOSCOPY    . COLONOSCOPY WITH PROPOFOL N/A 01/22/2018   Procedure: COLONOSCOPY WITH PROPOFOL;  Surgeon: Juanita Craver, MD;  Location: [facility];  Service: Endoscopy;  Laterality: N/A;  . ESOPHAGOGASTRODUODENOSCOPY (EGD) WITH PROPOFOL N/A 01/22/2018   Procedure: ESOPHAGOGASTRODUODENOSCOPY (EGD) WITH PROPOFOL;  Surgeon: Juanita Craver, MD;  Location[facility];  Service: Endoscopy;  Laterality: N/A;  . EYE SURGERY  10/2007  . EYE SURGERY Bilateral 2018   Eyelid  . LEG SURGERY     AT AGE 37  . LUMBAR FUSION  09/27/2015  . NECK SURGERY  06/29/2010  . SHOULDER SURGERY  09/24/2011  . TONSILLECTOMY     AT AGE 4  . VASECTOMY  Home Medications    Prior to Admission medications   Medication Sig Start Date End Date Taking? Authorizing Provider  acetaminophen (TYLENOL) 500 MG tablet Take 1,000 mg by mouth every 4 (four) hours as needed for mild pain or moderate pain (Takes 2 every 4- 6 hours).    Yes [provider]  albuterol (PROVENTIL) (2.5 MG/3ML) 0.083% nebulizer solution Take 3 mLs (2.5 mg total) by nebulization every 6 (six) hours as needed for wheezing or shortness of breath. 04/20/18  Yes Nat Christen, MD  CARTIA XT 240 MG 24 hr capsule TAKE 1 CAPSULE BY MOUTH ONCE DAILY 01/09/18  Yes [place],  Peter M, MD  cetirizine (ZYRTEC) 10 MG tablet Take 10 mg by mouth daily.   Yes [provider]  donepezil (ARICEPT) 5 MG tablet Take 1 tablet (5 mg total) by mouth daily. 08/12/17  Yes Melvenia Beam, MD  dorzolamide-timolol (COSOPT) 22.3-6.8 MG/ML ophthalmic solution Place 1 drop into the left eye 2 (two) times daily.   Yes [provider]  ELIQUIS 5 MG TABS tablet TAKE 1 TABLET BY MOUTH TWICE DAILY 02/02/18  Yes [place], Peter M, MD  ezetimibe (ZETIA) 10 MG tablet Take 10 mg by mouth daily.   Yes [provider]  ketorolac (ACULAR) 0.5 % ophthalmic solution Place 1 drop into the left eye 4 (four) times daily.  08/22/14  Yes [provider]  latanoprost (XALATAN) 0.005 % ophthalmic solution Place 1 drop into both eyes at bedtime.    Yes [provider]  losartan (COZAAR) 100 MG tablet Take 1 tablet (100 mg total) by mouth daily. 01/28/18 01/23/19 Yes [place], Peter M, MD  memantine (NAMENDA) 10 MG tablet Take 1 tablet (10 mg total) by mouth 2 (two) times daily. 08/12/17  Yes Melvenia Beam, MD  montelukast (SINGULAIR) 10 MG tablet Take 10 mg by mouth at bedtime.  03/09/15  Yes [provider]  Multiple Vitamins-Minerals (MULTIVITAMIN WITH MINERALS) tablet Take 1 tablet by mouth daily.   Yes [provider]  niacin (NIASPAN) 1000 MG CR tablet Take 1,000 mg by mouth at bedtime.  03/09/15  Yes [provider]  omeprazole (PRILOSEC) 20 MG capsule Take 20 mg by mouth 2 (two) times daily before a meal.  03/09/15  Yes [provider]  oseltamivir (TAMIFLU) 75 MG capsule Take 75 mg by mouth 2 (two) times daily.   Yes [provider]  sertraline (ZOLOFT) 100 MG tablet Take 2 tablets (200 mg total) by mouth at bedtime. 07/11/18  Yes Melvenia Beam, MD  SYMBICORT 160-4.5 MCG/ACT inhaler Take 2 puffs by mouth 2 (two) times daily. 11/03/17  Yes [provider]  timolol (BETIMOL) 0.5 % ophthalmic solution Place 1 drop  into the left eye 2 (two) times daily.   Yes [provider]    Family History Family History  Problem Relation Age of Onset  . Hypertension Mother   . Macular degeneration Mother   . Stroke Father   . Emphysema Father        smoked  . Diabetes Other   . Hypothyroidism Other   . Cancer Other   . Diabetes type II Brother   . Diabetes type II Brother   . Hyperthyroidism Other   . Cataracts Other   . Glaucoma Other     Social History Social History   Tobacco Use  . Smoking status: Former Smoker    Packs/day: 3.00    Years: 23.00    Pack years:  69.00    Types: Cigarettes    Last attempt to quit: 10/12/1997    Years since quitting: 20.9  . Smokeless tobacco: Former Systems developer    Types: Chew  Substance Use Topics  . Alcohol use: Yes    Alcohol/week: 0.0 standard drinks    Comment: occasional now maybe 1 beer per week or two. former heavy/daily drinker.  . Drug use: No     Allergies   Ace inhibitors; Crestor [rosuvastatin]; Simvastatin; and Statins   Review of Systems Review of Systems  Unable to perform ROS: Dementia  Cardiovascular: Positive for palpitations.  Psychiatric/Behavioral: Positive for confusion.     Physical Exam Updated Vital Signs BP (!) 142/74   Pulse (!) 121   Temp 98.1 F (36.7 C) (Oral)   Resp 16   Ht 1.753 m (5\' 9" )   Wt 97.5 kg   SpO2 95%   BMI 31.75 kg/m   Physical Exam Vitals signs and nursing note reviewed.  Constitutional:      Appearance: Normal appearance.  HENT:     Head: Normocephalic and atraumatic.     Mouth/Throat:     Mouth: Mucous membranes are moist.  Eyes:     Extraocular Movements: Extraocular movements intact.     Pupils: Pupils are equal, round, and reactive to light.  Neck:     Musculoskeletal: Neck supple.  Cardiovascular:     Rate and Rhythm: Tachycardia present. Rhythm irregular.  Pulmonary:     Effort: Pulmonary effort is normal. No respiratory distress.     Breath sounds: Normal breath  sounds. No wheezing.  Abdominal:     General: Bowel sounds are normal.     Tenderness: There is no abdominal tenderness.  Musculoskeletal:        General: No swelling.  Skin:    General: Skin is warm.     Capillary Refill: Capillary refill takes less than 2 seconds.  Neurological:     Mental Status: He is alert. Mental status is at baseline.      ED Treatments / Results  Labs (all labs ordered are listed, but only abnormal results are displayed) Labs Reviewed  CBC WITH DIFFERENTIAL/PLATELET - Abnormal; Notable for the following components:      Result Value   WBC 16.1 (*)    Neutro Abs 12.1 (*)    Monocytes Absolute 1.6 (*)    All other components within normal limits  BASIC METABOLIC PANEL - Abnormal; Notable for the following components:   Sodium 134 (*)    Glucose, Bld 117 (*)    All other components within normal limits  CULTURE, BLOOD (ROUTINE X 2)  CULTURE, BLOOD (ROUTINE X 2)  URINALYSIS, ROUTINE W REFLEX MICROSCOPIC  I-STAT CG4 LACTIC ACID, ED    EKG EKG Interpretation  Date/Time:  Friday October 02 2018 19:16:18 EST Ventricular Rate:  114 PR Interval:    QRS Duration: 84 QT Interval:  325 QTC Calculation: 448 R Axis:   168 Text Interpretation:  Right and left arm electrode reversal,  Atrial fibrillation Right axis deviation Abnormal T, consider ischemia, lateral leads atrial fibrillation is new since last tracing Confirmed by Dorie Rank [phone]) on 10/02/2018 7:25:36 PM Also confirmed by Fredia Sorrow [phone])  on 10/02/2018 7:54:05 PM   Radiology Dg Chest 2 View  Result Date: 10/02/2018 CLINICAL DATA:  Acute onset of palpitations and tachycardia. Question of atrial fibrillation. EXAM: CHEST - 2 VIEW COMPARISON:  Chest radiograph performed 04/20/2018 FINDINGS: The lungs are well-aerated. Focal right middle  lobe airspace opacity is concerning for pneumonia. There is no evidence of pleural effusion or pneumothorax. The heart is normal in size; the  mediastinal contour is within normal limits. No acute osseous abnormalities are seen. Cervical spinal fusion hardware is partially imaged. IMPRESSION: Focal right middle lobe pneumonia noted. Electronically Signed   By: Garald Balding M.D.   On: 10/02/2018 21:26    Procedures Procedures (including critical care time)  Medications Ordered in ED Medications  0.9 %  sodium chloride infusion ( Intravenous New Bag/Given 10/02/18 2100)  cefTRIAXone (ROCEPHIN) 1 g in sodium chloride 0.9 % 100 mL IVPB (1 g Intravenous New Bag/Given 10/02/18 2151)  azithromycin (ZITHROMAX) 500 mg in sodium chloride 0.9 % 250 mL IVPB (has no administration in time range)  sodium chloride 0.9 % bolus 500 mL (500 mLs Intravenous New Bag/Given 10/02/18 2100)     Initial Impression / Assessment and Plan / ED Course  I have reviewed the triage vital signs and the nursing notes.  Pertinent labs & imaging results that were available during my care of the patient were reviewed by me and considered in my medical decision making (see chart for details).     Patient already been diagnosed with influenza B is on Tamiflu.  Started to have tachycardia regular heart rate palpitations wife stated that when this usually happens he is gone into pneumonia.  Chest x-ray consistent with right middle lobe pneumonia.  Heart rate in the 120s irregular.  Patient is on a calcium channel blocker patient is also on the blood thinner Eliquis.  If heart rate not well controlled patient may require admission.  Oxygen saturations are fine on room air 95%.  Patient will have lactic acid checked and blood cultures.  Started on Rocephin and Zithromax.  No known allergies.  Blood pressures have been fine.  Final Clinical Impressions(s) / ED Diagnoses   Final diagnoses:  Influenza  Community acquired pneumonia of right middle lobe of lung ([place])  Atrial fibrillation with RVR [facility])    ED Discharge Orders    None       Fredia Sorrow,  MD 10/02/18 2202

## 2018-10-02 NOTE — ED Notes (Signed)
Assisted pt to the bathroom

## 2018-10-03 DIAGNOSIS — I4891 Unspecified atrial fibrillation: Secondary | ICD-10-CM

## 2018-10-03 DIAGNOSIS — G9341 Metabolic encephalopathy: Secondary | ICD-10-CM

## 2018-10-03 DIAGNOSIS — J449 Chronic obstructive pulmonary disease, unspecified: Secondary | ICD-10-CM

## 2018-10-03 DIAGNOSIS — J101 Influenza due to other identified influenza virus with other respiratory manifestations: Secondary | ICD-10-CM

## 2018-10-03 DIAGNOSIS — J181 Lobar pneumonia, unspecified organism: Secondary | ICD-10-CM

## 2018-10-03 LAB — BASIC METABOLIC PANEL
Anion gap: 7 (ref 5–15)
BUN: 11 mg/dL (ref 8–23)
CALCIUM: 8.8 mg/dL — AB (ref 8.9–10.3)
CO2: 23 mmol/L (ref 22–32)
Chloride: 106 mmol/L (ref 98–111)
Creatinine, Ser: 0.89 mg/dL (ref 0.61–1.24)
GFR calc Af Amer: >60 mL/min (ref >60)
GFR calc non Af Amer: >60 mL/min (ref >60)
Glucose, Bld: 108 mg/dL — ABNORMAL HIGH (ref 70–99)
Potassium: 3.9 mmol/L (ref 3.5–5.1)
Sodium: 136 mmol/L (ref 135–145)

## 2018-10-03 LAB — CBC WITH DIFFERENTIAL/PLATELET
Abs Immature Granulocytes: 0.09 10*3/uL — ABNORMAL HIGH (ref 0.00–0.07)
BASOS PCT: 0 %
Basophils Absolute: 0 10*3/uL (ref 0.0–0.1)
Eosinophils Absolute: 0.2 10*3/uL (ref 0.0–0.5)
Eosinophils Relative: 2 %
HCT: 36.9 % — ABNORMAL LOW (ref 39.0–52.0)
Hemoglobin: 11.9 g/dL — ABNORMAL LOW (ref 13.0–17.0)
Immature Granulocytes: 1 %
Lymphocytes Relative: 12 %
Lymphs Abs: 1.8 10*3/uL (ref 0.7–4.0)
MCH: 30.4 pg (ref 26.0–34.0)
MCHC: 32.2 g/dL (ref 30.0–36.0)
MCV: 94.1 fL (ref 80.0–100.0)
Monocytes Absolute: 1.5 10*3/uL — ABNORMAL HIGH (ref 0.1–1.0)
Monocytes Relative: 10 %
Neutro Abs: 10.7 10*3/uL — ABNORMAL HIGH (ref 1.7–7.7)
Neutrophils Relative %: 75 %
Platelets: 231 10*3/uL (ref 150–400)
RBC: 3.92 MIL/uL — ABNORMAL LOW (ref 4.22–5.81)
RDW: 12.2 % (ref 11.5–15.5)
WBC: 14.3 10*3/uL — ABNORMAL HIGH (ref 4.0–10.5)
nRBC: 0 % (ref 0.0–0.2)

## 2018-10-03 LAB — STREP PNEUMONIAE URINARY ANTIGEN: STREP PNEUMO URINARY ANTIGEN: NEGATIVE

## 2018-10-03 LAB — LACTIC ACID, PLASMA: Lactic Acid, Venous: 0.7 mmol/L (ref 0.5–1.9)

## 2018-10-03 MED ORDER — PREDNISONE 20 MG PO TABS: 40.0000 mg | ORAL_TABLET | Freq: Every day | ORAL | 0 refills | Status: DC

## 2018-10-03 MED ORDER — GUAIFENESIN ER 600 MG PO TB12: 600.0000 mg | ORAL_TABLET | Freq: Two times a day (BID) | ORAL | 2 refills | Status: AC

## 2018-10-03 MED ORDER — AZITHROMYCIN 500 MG PO TABS: 500.0000 mg | ORAL_TABLET | Freq: Every day | ORAL | 0 refills | Status: AC

## 2018-10-03 MED ORDER — AMOXICILLIN-POT CLAVULANATE 875-125 MG PO TABS: 1.0000 | ORAL_TABLET | Freq: Two times a day (BID) | ORAL | 0 refills | Status: AC

## 2018-10-03 NOTE — ED Notes (Signed)
Pt taken off of oxygen [place] for 15 mins. Oxygen stayed above 95%.  Pt walked with assistance without oxygen.  o2 sat stayed above 96%. Pt denied any weakness or dizziness

## 2018-10-03 NOTE — Discharge Summary (Signed)
Physician Discharge Summary  Juan Johnson LHT:[id number] DOB: [birth year] DOA: 10/02/2018  PCP: Curly Rim, MD  Admit date: 10/02/2018 Discharge date: 10/03/2018  Admitted From: Home Disposition: Home  Recommendations for Outpatient Follow-up:  1. Follow up with PCP in 1-2 weeks 2. Please obtain BMP/CBC in one week  Discharge Condition: Stable CODE STATUS: Full code Diet recommendation: Heart healthy  Brief/Interim Summary: 67 year old male with a history of dementia, COPD, atrial fibrillation on anticoagulation, brought to the hospital with worsening confusion.  He was recently diagnosed with influenza and was febrile at home.  When his fevers and confusion persisted, his wife brought him to the emergency room.  He was noted to be tachycardic in rapid atrial fibrillation on arrival to the emergency room.  Chest x-ray also indicated developing pneumonia.  The patient was started on intravenous antibiotics and IV fluid hydration.  His heart rate has since improved and his heart rate is now down to the 60s.  He has not had any further fevers.  Oxygen saturations are stable on room air and is able to ambulate without difficulty.  He is continued on his calcium channel blocker as well as Eliquis for atrial fibrillation.  He has not had any further fevers and WBC count is improving.  He has been advised to continue oral hydration.  He will be transitioned to a course of oral antibiotics.  He will continue his course of Tamiflu.  The patient already has nebulizer treatments at home and is continued on Symbicort.  Will provide a course of prednisone.  Patient is feeling better, ambulated around the unit without difficulty and feels ready for discharge home.  Wife is also in agreement.  Discharge Diagnoses:  Principal Problem:   A-fib [facility]) Active Problems:   Essential hypertension   COPD GOLD I   PNA (pneumonia)   Dementia without behavioral disturbance (HCC)   Influenza A   Acute  metabolic encephalopathy    Discharge Instructions  Discharge Instructions    Diet - low sodium heart healthy   Complete by:  As directed    Increase activity slowly   Complete by:  As directed      Allergies as of 10/03/2018      Reactions   Ace Inhibitors Cough   Crestor [rosuvastatin] Other (See Comments)   MYALGIAS CRAMPS MUSCLE WEAKNESS   Simvastatin Other (See Comments)   MYALGIAS CRAMPS MUSCLE WEAKNESS   Statins    MYALGIAS CRAMPS MUSCLE WEAKNESS      Medication List    TAKE these medications   acetaminophen 500 MG tablet Commonly known as:  TYLENOL Take 1,000 mg by mouth every 4 (four) hours as needed for mild pain or moderate pain (Takes 2 every 4- 6 hours).   albuterol (2.5 MG/3ML) 0.083% nebulizer solution Commonly known as:  PROVENTIL Take 3 mLs (2.5 mg total) by nebulization every 6 (six) hours as needed for wheezing or shortness of breath.   amoxicillin-clavulanate 875-125 MG tablet Commonly known as:  AUGMENTIN Take 1 tablet by mouth 2 (two) times daily for 5 days.   azithromycin 500 MG tablet Commonly known as:  ZITHROMAX Take 1 tablet (500 mg total) by mouth daily for 5 days. Take 1 tablet daily for 3 days.   CARTIA XT 240 MG 24 hr capsule Generic drug:  diltiazem TAKE 1 CAPSULE BY MOUTH ONCE DAILY   cetirizine 10 MG tablet Commonly known as:  ZYRTEC Take 10 mg by mouth daily.   donepezil 5 MG tablet  Commonly known as:  ARICEPT Take 1 tablet (5 mg total) by mouth daily.   dorzolamide-timolol 22.3-6.8 MG/ML ophthalmic solution Commonly known as:  COSOPT Place 1 drop into the left eye 2 (two) times daily.   ELIQUIS 5 MG Tabs tablet Generic drug:  apixaban TAKE 1 TABLET BY MOUTH TWICE DAILY   ezetimibe 10 MG tablet Commonly known as:  ZETIA Take 10 mg by mouth daily.   guaiFENesin 600 MG 12 hr tablet Commonly known as:  MUCINEX Take 1 tablet (600 mg total) by mouth 2 (two) times daily.   ketorolac 0.5 % ophthalmic  solution Commonly known as:  ACULAR Place 1 drop into the left eye 4 (four) times daily.   losartan 100 MG tablet Commonly known as:  COZAAR Take 1 tablet (100 mg total) by mouth daily.   memantine 10 MG tablet Commonly known as:  NAMENDA Take 1 tablet (10 mg total) by mouth 2 (two) times daily.   montelukast 10 MG tablet Commonly known as:  SINGULAIR Take 10 mg by mouth at bedtime.   multivitamin with minerals tablet Take 1 tablet by mouth daily.   niacin 1000 MG CR tablet Commonly known as:  NIASPAN Take 1,000 mg by mouth at bedtime.   omeprazole 20 MG capsule Commonly known as:  PRILOSEC Take 20 mg by mouth 2 (two) times daily before a meal.   oseltamivir 75 MG capsule Commonly known as:  TAMIFLU Take 75 mg by mouth 2 (two) times daily.   predniSONE 20 MG tablet Commonly known as:  DELTASONE Take 2 tablets (40 mg total) by mouth daily.   sertraline 100 MG tablet Commonly known as:  ZOLOFT Take 2 tablets (200 mg total) by mouth at bedtime.   SYMBICORT 160-4.5 MCG/ACT inhaler Generic drug:  budesonide-formoterol Take 2 puffs by mouth 2 (two) times daily.   timolol 0.5 % ophthalmic solution Commonly known as:  BETIMOL Place 1 drop into the left eye 2 (two) times daily.   XALATAN 0.005 % ophthalmic solution Generic drug:  latanoprost Place 1 drop into both eyes at bedtime.       Allergies  Allergen Reactions  . Ace Inhibitors Cough  . Crestor [Rosuvastatin] Other (See Comments)    MYALGIAS CRAMPS MUSCLE WEAKNESS  . Simvastatin Other (See Comments)    MYALGIAS CRAMPS MUSCLE WEAKNESS  . Statins     MYALGIAS CRAMPS MUSCLE WEAKNESS    Consultations:     Procedures/Studies: Dg Chest 2 View  Result Date: 10/02/2018 CLINICAL DATA:  Acute onset of palpitations and tachycardia. Question of atrial fibrillation. EXAM: CHEST - 2 VIEW COMPARISON:  Chest radiograph performed 04/20/2018 FINDINGS: The lungs are well-aerated. Focal right middle lobe  airspace opacity is concerning for pneumonia. There is no evidence of pleural effusion or pneumothorax. The heart is normal in size; the mediastinal contour is within normal limits. No acute osseous abnormalities are seen. Cervical spinal fusion hardware is partially imaged. IMPRESSION: Focal right middle lobe pneumonia noted. Electronically Signed   By: Garald Balding M.D.   On: 10/02/2018 21:26       Subjective: Overall he is feeling better.  He does have a cough.  No wheezing.  Discharge Exam: Vitals:   10/03/18 1046 10/03/18 1047 10/03/18 1048 10/03/18 1052  BP: (!) 150/68   (!) 150/68  Pulse:    65  Resp: 20   16  Temp:      TempSrc:      SpO2: 97% 96% 95% 94%  Weight:  Height:        General: Pt is alert, awake, not in acute distress Cardiovascular: RRR, S1/S2 +, no rubs, no gallops Respiratory: Scattered rhonchi Abdominal: Soft, NT, ND, bowel sounds + Extremities: no edema, no cyanosis    The results of significant diagnostics from this hospitalization (including imaging, microbiology, ancillary and laboratory) are listed below for reference.     Microbiology: Recent Results (from the past 240 hour(s))  Culture, blood (Routine X 2) w Reflex to ID Panel     Status: None (Preliminary result)   Collection Time: 10/02/18  9:50 PM  Result Value Ref Range Status   Specimen Description RIGHT ANTECUBITAL  Final   Special Requests   Final    BOTTLES DRAWN AEROBIC AND ANAEROBIC Blood Culture adequate volume Performed at [facility], [address] [place], [place] [address]    Culture PENDING  Incomplete   Report Status PENDING  Incomplete  Culture, blood (Routine X 2) w Reflex to ID Panel     Status: None (Preliminary result)   Collection Time: 10/02/18  9:55 PM  Result Value Ref Range Status   Specimen Description BLOOD LEFT ARM  Final   Special Requests   Final    BOTTLES DRAWN AEROBIC AND ANAEROBIC Blood Culture adequate volume Performed at [facility], [address], [place], [place] [address]    Culture PENDING  Incomplete   Report Status PENDING  Incomplete     Labs: BNP (last 3 results) No results for input(s): BNP in the last 8760 hours. Basic Metabolic Panel: Recent Labs  Lab 10/02/18 2042 10/03/18 0559  NA 134* 136  K 3.8 3.9  CL 103 106  CO2 22 23  GLUCOSE 117* 108*  BUN 13 11  CREATININE 0.92 0.89  CALCIUM 9.0 8.8*   Liver Function Tests: No results for input(s): AST, ALT, ALKPHOS, BILITOT, PROT, ALBUMIN in the last 168 hours. No results for input(s): LIPASE, AMYLASE in the last 168 hours. No results for input(s): AMMONIA in the last 168 hours. CBC: Recent Labs  Lab 10/02/18 2042 10/03/18 0559  WBC 16.1* 14.3*  NEUTROABS 12.1* 10.7*  HGB 13.2 11.9*  HCT 41.6 36.9*  MCV 93.5 94.1  PLT 228 231   Cardiac Enzymes: No results for input(s): CKTOTAL, CKMB, CKMBINDEX, TROPONINI in the last 168 hours. BNP: Invalid input(s): POCBNP CBG: No results for input(s): GLUCAP in the last 168 hours. D-Dimer No results for input(s): DDIMER in the last 72 hours. Hgb A1c No results for input(s): HGBA1C in the last 72 hours. Lipid Profile No results for input(s): CHOL, HDL, LDLCALC, TRIG, CHOLHDL, LDLDIRECT in the last 72 hours. Thyroid function studies No results for input(s): TSH, T4TOTAL, T3FREE, THYROIDAB in the last 72 hours.  Invalid input(s): FREET3 Anemia work up No results for input(s): VITAMINB12, FOLATE, FERRITIN, TIBC, IRON, RETICCTPCT in the last 72 hours. Urinalysis    Component Value Date/Time   COLORURINE YELLOW 02/17/2017 0028   APPEARANCEUR HAZY (A) 02/17/2017 0028   LABSPEC 1.025 02/17/2017 0028   PHURINE 5.0 02/17/2017 0028   GLUCOSEU NEGATIVE 02/17/2017 0028   HGBUR NEGATIVE 02/17/2017 0028   BILIRUBINUR NEGATIVE 02/17/2017 0028   KETONESUR 5 (A) 02/17/2017 0028   PROTEINUR 100 (A) 02/17/2017 0028   UROBILINOGEN 0.2 05/26/2010 0959   NITRITE NEGATIVE 02/17/2017 0028   LEUKOCYTESUR  NEGATIVE 02/17/2017 0028   Sepsis Labs Invalid input(s): PROCALCITONIN,  WBC,  LACTICIDVEN Microbiology Recent Results (from the past 240 hour(s))  Culture, blood (Routine X 2)  w Reflex to ID Panel     Status: None (Preliminary result)   Collection Time: 10/02/18  9:50 PM  Result Value Ref Range Status   Specimen Description RIGHT ANTECUBITAL  Final   Special Requests   Final    BOTTLES DRAWN AEROBIC AND ANAEROBIC Blood Culture adequate volume Performed at [facility], [address] [place], [place] [address]    Culture PENDING  Incomplete   Report Status PENDING  Incomplete  Culture, blood (Routine X 2) w Reflex to ID Panel     Status: None (Preliminary result)   Collection Time: 10/02/18  9:55 PM  Result Value Ref Range Status   Specimen Description BLOOD LEFT ARM  Final   Special Requests   Final    BOTTLES DRAWN AEROBIC AND ANAEROBIC Blood Culture adequate volume Performed at [facility], [address], [place], [place] [address]    Culture PENDING  Incomplete   Report Status PENDING  Incomplete     Time coordinating discharge: 75mins  SIGNED:   Kathie Dike, MD  [facility] 10/03/2018, 11:19 AM Pager   If 7PM-7AM, please contact night-coverage [email or web address] Password [email or web address]

## 2018-10-03 NOTE — ED Notes (Signed)
Pt moved into a hospital bed, pt comfortable at this time

## 2018-10-03 NOTE — ED Notes (Signed)
Pt assisted to bathroom -pt appears unsure of his stepping at first, with reassurance pt is more secure with stepping and slow and steady- pt does require standby assist for safety reasons. This nurse stayed with pt while using bathroom due to risk of fall.

## 2018-10-03 NOTE — ED Notes (Signed)
hospitalist at bedside

## 2018-10-07 LAB — CULTURE, BLOOD (ROUTINE X 2)
CULTURE: NO GROWTH
Culture: NO GROWTH
Special Requests: ADEQUATE
Special Requests: ADEQUATE

## 2018-11-07 ENCOUNTER — Other Ambulatory Visit: Payer: Self-pay | Admitting: Neurology

## 2018-11-07 DIAGNOSIS — F0281 Dementia in other diseases classified elsewhere with behavioral disturbance: Secondary | ICD-10-CM

## 2018-11-07 DIAGNOSIS — G300 Alzheimer's disease with early onset: Principal | ICD-10-CM

## 2018-11-07 DIAGNOSIS — F02818 Dementia in other diseases classified elsewhere, unspecified severity, with other behavioral disturbance: Secondary | ICD-10-CM

## 2019-01-15 ENCOUNTER — Other Ambulatory Visit: Payer: Self-pay | Admitting: Cardiology

## 2019-01-15 ENCOUNTER — Other Ambulatory Visit: Payer: Self-pay | Admitting: Neurology

## 2019-01-15 DIAGNOSIS — F02818 Dementia in other diseases classified elsewhere, unspecified severity, with other behavioral disturbance: Secondary | ICD-10-CM

## 2019-01-15 DIAGNOSIS — F0281 Dementia in other diseases classified elsewhere with behavioral disturbance: Secondary | ICD-10-CM

## 2019-01-15 DIAGNOSIS — G300 Alzheimer's disease with early onset: Principal | ICD-10-CM

## 2019-01-15 NOTE — Telephone Encounter (Signed)
Eliquis and Cartia XT refilled.

## 2019-01-18 ENCOUNTER — Other Ambulatory Visit: Payer: Self-pay | Admitting: *Deleted

## 2019-01-18 ENCOUNTER — Other Ambulatory Visit: Payer: Self-pay | Admitting: Neurology

## 2019-01-18 DIAGNOSIS — G300 Alzheimer's disease with early onset: Principal | ICD-10-CM

## 2019-01-18 DIAGNOSIS — F0281 Dementia in other diseases classified elsewhere with behavioral disturbance: Secondary | ICD-10-CM

## 2019-01-18 NOTE — Telephone Encounter (Signed)
error 

## 2019-01-21 ENCOUNTER — Other Ambulatory Visit: Payer: Self-pay | Admitting: Neurology

## 2019-01-21 DIAGNOSIS — F02818 Dementia in other diseases classified elsewhere, unspecified severity, with other behavioral disturbance: Secondary | ICD-10-CM

## 2019-01-21 DIAGNOSIS — F0281 Dementia in other diseases classified elsewhere with behavioral disturbance: Secondary | ICD-10-CM

## 2019-01-21 DIAGNOSIS — G300 Alzheimer's disease with early onset: Principal | ICD-10-CM

## 2019-01-26 ENCOUNTER — Ambulatory Visit: Payer: Medicare Other | Admitting: Cardiology

## 2019-01-28 ENCOUNTER — Ambulatory Visit: Payer: Medicare Other | Admitting: Cardiology

## 2019-02-12 ENCOUNTER — Other Ambulatory Visit: Payer: Self-pay | Admitting: Cardiology

## 2019-02-12 NOTE — Telephone Encounter (Signed)
Losartan refilled 

## 2019-03-19 ENCOUNTER — Other Ambulatory Visit: Payer: Self-pay | Admitting: Neurology

## 2019-03-19 MED ORDER — QUETIAPINE FUMARATE 25 MG PO TABS: 25.0000 mg | ORAL_TABLET | Freq: Two times a day (BID) | ORAL | 6 refills | Status: DC

## 2019-04-01 ENCOUNTER — Other Ambulatory Visit: Payer: Self-pay | Admitting: Neurology

## 2019-04-01 DIAGNOSIS — F0391 Unspecified dementia with behavioral disturbance: Secondary | ICD-10-CM

## 2019-04-01 MED ORDER — QUETIAPINE FUMARATE ER 50 MG PO TB24: 50.0000 mg | ORAL_TABLET | Freq: Every day | ORAL | 6 refills | Status: DC

## 2019-04-02 ENCOUNTER — Telehealth: Payer: Self-pay | Admitting: Neurology

## 2019-04-02 NOTE — Telephone Encounter (Signed)
With Goodrx, prescription can be bought for as little as $17 at [employer]. I took care of this, sent patient the link and called [facility] where they can get it for $30. thanks

## 2019-04-02 NOTE — Telephone Encounter (Signed)
Pt wife is asking for a call from RN to discuss the QUEtiapine (SEROQUEL XR) 50 MG TB24 24 hr tablet prescription

## 2019-04-05 NOTE — Telephone Encounter (Signed)
Noted thanks °

## 2019-05-05 ENCOUNTER — Other Ambulatory Visit: Payer: Self-pay

## 2019-05-05 ENCOUNTER — Emergency Department (HOSPITAL_COMMUNITY): Payer: Medicare Other

## 2019-05-05 ENCOUNTER — Emergency Department (HOSPITAL_COMMUNITY)
Admission: EM | Admit: 2019-05-05 | Discharge: 2019-05-05 | Disposition: A | Discharge status: Home / Self Care | Payer: Medicare Other | Attending: Emergency Medicine | Admitting: Emergency Medicine

## 2019-05-05 ENCOUNTER — Encounter (HOSPITAL_COMMUNITY): Payer: Self-pay | Admitting: Emergency Medicine

## 2019-05-05 DIAGNOSIS — J449 Chronic obstructive pulmonary disease, unspecified: Secondary | ICD-10-CM

## 2019-05-05 DIAGNOSIS — R509 Fever, unspecified: Secondary | ICD-10-CM | POA: Insufficient documentation

## 2019-05-05 DIAGNOSIS — Z87891 Personal history of nicotine dependence: Secondary | ICD-10-CM | POA: Insufficient documentation

## 2019-05-05 DIAGNOSIS — Z20828 Contact with and (suspected) exposure to other viral communicable diseases: Secondary | ICD-10-CM | POA: Insufficient documentation

## 2019-05-05 DIAGNOSIS — F0390 Unspecified dementia without behavioral disturbance: Secondary | ICD-10-CM | POA: Insufficient documentation

## 2019-05-05 DIAGNOSIS — Z79899 Other long term (current) drug therapy: Secondary | ICD-10-CM | POA: Diagnosis not present

## 2019-05-05 DIAGNOSIS — Z87898 Personal history of other specified conditions: Secondary | ICD-10-CM

## 2019-05-05 DIAGNOSIS — I119 Hypertensive heart disease without heart failure: Secondary | ICD-10-CM | POA: Insufficient documentation

## 2019-05-05 LAB — BASIC METABOLIC PANEL
Anion gap: 8 (ref 5–15)
BUN: 15 mg/dL (ref 8–23)
CO2: 22 mmol/L (ref 22–32)
Calcium: 9.1 mg/dL (ref 8.9–10.3)
Chloride: 109 mmol/L (ref 98–111)
Creatinine, Ser: 0.91 mg/dL (ref 0.61–1.24)
GFR calc Af Amer: >60 mL/min (ref >60)
GFR calc non Af Amer: >60 mL/min (ref >60)
Glucose, Bld: 102 mg/dL — ABNORMAL HIGH (ref 70–99)
Potassium: 3.8 mmol/L (ref 3.5–5.1)
Sodium: 139 mmol/L (ref 135–145)

## 2019-05-05 LAB — URINALYSIS, ROUTINE W REFLEX MICROSCOPIC
Bilirubin Urine: NEGATIVE
Glucose, UA: NEGATIVE mg/dL
Hgb urine dipstick: NEGATIVE
Ketones, ur: NEGATIVE mg/dL
Leukocytes,Ua: NEGATIVE
Nitrite: NEGATIVE
Protein, ur: NEGATIVE mg/dL
Specific Gravity, Urine: 1.03 (ref 1.005–1.030)
pH: 5 (ref 5.0–8.0)

## 2019-05-05 LAB — CBC WITH DIFFERENTIAL/PLATELET
Abs Immature Granulocytes: 0.05 10*3/uL (ref 0.00–0.07)
Basophils Absolute: 0 10*3/uL (ref 0.0–0.1)
Basophils Relative: 0 %
Eosinophils Absolute: 0.3 10*3/uL (ref 0.0–0.5)
Eosinophils Relative: 3 %
HCT: 34.3 % — ABNORMAL LOW (ref 39.0–52.0)
Hemoglobin: 10.8 g/dL — ABNORMAL LOW (ref 13.0–17.0)
Immature Granulocytes: 1 %
Lymphocytes Relative: 20 %
Lymphs Abs: 1.9 10*3/uL (ref 0.7–4.0)
MCH: 28.4 pg (ref 26.0–34.0)
MCHC: 31.5 g/dL (ref 30.0–36.0)
MCV: 90.3 fL (ref 80.0–100.0)
Monocytes Absolute: 0.9 10*3/uL (ref 0.1–1.0)
Monocytes Relative: 9 %
Neutro Abs: 6.5 10*3/uL (ref 1.7–7.7)
Neutrophils Relative %: 67 %
Platelets: 311 10*3/uL (ref 150–400)
RBC: 3.8 MIL/uL — ABNORMAL LOW (ref 4.22–5.81)
RDW: 13.2 % (ref 11.5–15.5)
WBC: 9.7 10*3/uL (ref 4.0–10.5)
nRBC: 0 % (ref 0.0–0.2)

## 2019-05-05 LAB — SARS CORONAVIRUS 2 BY RT PCR (HOSPITAL ORDER, PERFORMED IN ~~LOC~~ HOSPITAL LAB): SARS Coronavirus 2: NEGATIVE

## 2019-05-05 LAB — LACTIC ACID, PLASMA: Lactic Acid, Venous: 0.7 mmol/L (ref 0.5–1.9)

## 2019-05-05 NOTE — Discharge Instructions (Addendum)
Your lab tests, exam and chest xray is normal is stable today with no evidence of a lung infection or any other source of infection which would explain this isolated fever reading.  You do have blood cultures that are still pending which may offer some information but will take 1-2 days to result.  In the interim, it is safe to go home, you will be notified if either of these cultures is abnormal. Continue taking your home medicines.  Call your primary MD if your fever returns or you develop any new symptoms.

## 2019-05-05 NOTE — ED Provider Notes (Signed)
[facility] EMERGENCY DEPARTMENT Provider Note   CSN: [id number] Arrival date & time: 05/05/19  1607    History   Chief Complaint Chief Complaint  Patient presents with  . Fever    HPI Juan Johnson is a 68 y.o. male with a history significant for anemia, anxiety, arthritis, COPD, new onset of dementia, GERD, hypertension paroxysmal atrial flutter, prior CVA with peripheral vision loss and frequent bouts of pneumonia, wife stating he typically will have pneumonia every 6 months, was seen by his pulmonologist in [place] today for routine follow-up, temperature obtained prior to being allowed into the facility was 103 and he was promptly referred here for further testing.  Wife does endorse he has had increased cough which has been productive of a yellow sputum for the past several days.  He denies any specific worsening shortness of breath, but does endorse chronic shortness of breath especially with exertion.  He denies other symptoms today including headache, neck pain, he denies chest pain, abdominal pain, no nausea or vomiting.  Wife endorses that his appetite has been poor this week.  He took Tylenol at 3 PM with adequate fever response.  No known exposures to COVID.    The history is provided by the patient and the spouse. The history is limited by the condition of the patient.    Past Medical History:  Diagnosis Date  . Anemia   . Anxiety    claustrophobia  . Arthritis   . Complication of anesthesia    difficult to wake up after neck surgery  . COPD (chronic obstructive pulmonary disease) ([place])   . Dementia ([place])   . Depression   . Enlarged liver    20-25 years ago  . GERD (gastroesophageal reflux disease)   . Glaucoma   . Hemoptysis    a. 03/2016 in setting of PNA-->resolved.  . Hypertensive heart disease   . Mitral regurgitation    a. 09/2015 Echo: EF 55-60%, no rwma, mild to mod MR, PASP 22mmHg.  . Paroxysmal atrial flutter ([place])    a. Noted 09/2015 following  back surgery-->Eliquis (CHA2DS2VASc = 4);  b. 03/2016 Recurrent PAFlutter in setting of PNA-->converted spont.  . 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 Pneumonia 04/2015   "bronchial" pneumonia   . Retinal tear of left eye   . Sleep apnea    does use cpap  . Stroke [facility]) ? 05/2014   peripheral vision loss    Patient Active Problem List   Diagnosis Date Noted  . A-fib ([place]) 10/02/2018  . Influenza A 10/02/2018  . Acute metabolic encephalopathy 67/59/1638  . Community acquired pneumonia of right middle lobe of lung ([place])   . GERD (gastroesophageal reflux disease) 02/17/2017  . OSA (obstructive sleep apnea) 02/17/2017  . HCAP (healthcare-associated pneumonia) 02/17/2017  . Sepsis ([place]) 02/17/2017  . Spondylolisthesis at L5-S1 level 02/14/2017  . Hypertensive heart disease   . Paroxysmal atrial flutter ([place])   . Dementia without behavioral disturbance ([place]) 06/04/2016  . Dementia with behavioral disturbance ([place]) 04/18/2016  . Atrial flutter ([place]) 01/11/2016  . Mitral valve regurgitation: mild to moderate 10/23/2015  . New onset atrial flutter ([place]) 10/04/2015  . Atrial fibrillation with rapid ventricular response ([place])   . Anemia 10/03/2015  . Stroke ([place])   . Spondylolisthesis of lumbar region 09/27/2015  . PNA (pneumonia) 05/25/2015  . Snoring 03/16/2015  . Witnessed apneic spells 03/16/2015  . Obese 03/16/2015  . Morning headache 03/16/2015  .  Excessive daytime sleepiness 03/16/2015  . COPD GOLD I 09/25/2014  . Upper airway cough syndrome vs cough variant asthma 08/22/2014  . Essential hypertension 08/22/2014  . Diplopia 06/25/2014  . Vision loss, bilateral 06/25/2014  . Weakness 06/25/2014  . Unspecified hereditary and idiopathic peripheral neuropathy 06/25/2014    Past Surgical History:  Procedure Laterality Date  . BACK SURGERY  09/27/2015   [place]  . CARDIAC CATHETERIZATION  2003  . COLONOSCOPY    . COLONOSCOPY WITH PROPOFOL N/A 01/22/2018    Procedure: COLONOSCOPY WITH PROPOFOL;  Surgeon: Juanita Craver, MD;  Location: [facility] ENDOSCOPY;  Service: Endoscopy;  Laterality: N/A;  . ESOPHAGOGASTRODUODENOSCOPY (EGD) WITH PROPOFOL N/A 01/22/2018   Procedure: ESOPHAGOGASTRODUODENOSCOPY (EGD) WITH PROPOFOL;  Surgeon: Juanita Craver, MD;  Location: [facility] ENDOSCOPY;  Service: Endoscopy;  Laterality: N/A;  . EYE SURGERY  10/2007  . EYE SURGERY Bilateral 2018   Eyelid  . LEG SURGERY     AT AGE 55  . LUMBAR FUSION  09/27/2015  . NECK SURGERY  06/29/2010  . SHOULDER SURGERY  09/24/2011  . TONSILLECTOMY     AT AGE 61  . VASECTOMY          Home Medications    Prior to Admission medications   Medication Sig Start Date End Date Taking? Authorizing Provider  acetaminophen (TYLENOL) 500 MG tablet Take 1,000 mg by mouth every 4 (four) hours as needed for mild pain or moderate pain (Takes 2 every 4- 6 hours).     [provider]  albuterol (PROVENTIL) (2.5 MG/3ML) 0.083% nebulizer solution Take 3 mLs (2.5 mg total) by nebulization every 6 (six) hours as needed for wheezing or shortness of breath. 04/20/18   Nat Christen, MD  CARTIA XT 240 MG 24 hr capsule Take 1 capsule by mouth once daily 01/15/19   [place], Peter M, MD  cetirizine (ZYRTEC) 10 MG tablet Take 10 mg by mouth daily.    [provider]  donepezil (ARICEPT) 5 MG tablet Take 1 tablet by mouth once daily 01/18/19   Melvenia Beam, MD  dorzolamide-timolol (COSOPT) 22.3-6.8 MG/ML ophthalmic solution Place 1 drop into the left eye 2 (two) times daily.    [provider]  ELIQUIS 5 MG TABS tablet Take 1 tablet by mouth twice daily 01/15/19   [place], Peter M, MD  ezetimibe (ZETIA) 10 MG tablet Take 10 mg by mouth daily.    [provider]  guaiFENesin (MUCINEX) 600 MG 12 hr tablet Take 1 tablet (600 mg total) by mouth 2 (two) times daily. 10/03/18 10/03/19  Kathie Dike, MD  ketorolac (ACULAR) 0.5 % ophthalmic solution Place 1 drop into the left eye 4 (four) times  daily.  08/22/14   [provider]  latanoprost (XALATAN) 0.005 % ophthalmic solution Place 1 drop into both eyes at bedtime.     [provider]  losartan (COZAAR) 100 MG tablet Take 1 tablet by mouth once daily 02/12/19   [place], Peter M, MD  memantine [facility]) 10 MG tablet Take 1 tablet by mouth twice daily 01/18/19   Melvenia Beam, MD  montelukast (SINGULAIR) 10 MG tablet Take 10 mg by mouth at bedtime.  03/09/15   [provider]  Multiple Vitamins-Minerals (MULTIVITAMIN WITH MINERALS) tablet Take 1 tablet by mouth daily.    [provider]  niacin (NIASPAN) 1000 MG CR tablet Take 1,000 mg by mouth at bedtime.  03/09/15   [provider]  omeprazole (PRILOSEC) 20 MG capsule Take 20 mg by  mouth 2 (two) times daily before a meal.  03/09/15   [provider]  oseltamivir (TAMIFLU) 75 MG capsule Take 75 mg by mouth 2 (two) times daily.    [provider]  predniSONE (DELTASONE) 20 MG tablet Take 2 tablets (40 mg total) by mouth daily. 10/03/18   Kathie Dike, MD  QUEtiapine (SEROQUEL XR) 50 MG TB24 24 hr tablet Take 1 tablet (50 mg total) by mouth at bedtime. 04/01/19   Melvenia Beam, MD  sertraline (ZOLOFT) 100 MG tablet Take 2 tablets (200 mg total) by mouth at bedtime. 07/11/18   Melvenia Beam, MD  SYMBICORT 160-4.5 MCG/ACT inhaler Take 2 puffs by mouth 2 (two) times daily. 11/03/17   [provider]  timolol (BETIMOL) 0.5 % ophthalmic solution Place 1 drop into the left eye 2 (two) times daily.    [provider]    Family History Family History  Problem Relation Age of Onset  . Hypertension Mother   . Macular degeneration Mother   . Stroke Father   . Emphysema Father        smoked  . Diabetes Other   . Hypothyroidism Other   . Cancer Other   . Diabetes type II Brother   . Diabetes type II Brother   . Hyperthyroidism Other   . Cataracts Other   . Glaucoma Other     Social History Social History    Tobacco Use  . Smoking status: Former Smoker    Packs/day: 3.00    Years: 23.00    Pack years: 69.00    Types: Cigarettes    Quit date: 10/12/1997    Years since quitting: 21.5  . Smokeless tobacco: Former Systems developer    Types: Chew  Substance Use Topics  . Alcohol use: Yes    Alcohol/week: 0.0 standard drinks    Comment: occasional now maybe 1 beer per week or two. former heavy/daily drinker.  . Drug use: No     Allergies   Ace inhibitors, Crestor [rosuvastatin], Simvastatin, and Statins   Review of Systems Review of Systems  Constitutional: Positive for appetite change and fever. Negative for chills.  HENT: Negative for congestion and sore throat.   Eyes: Negative.   Respiratory: Positive for cough and shortness of breath. Negative for chest tightness.   Cardiovascular: Negative for chest pain.  Gastrointestinal: Negative for abdominal pain, diarrhea, nausea and vomiting.  Genitourinary: Negative.   Musculoskeletal: Negative for arthralgias, joint swelling and neck pain.  Skin: Negative.  Negative for rash and wound.  Neurological: Negative for headaches.  Psychiatric/Behavioral: Positive for confusion.       Baseline confusion.  Wife states he has hallucinations, for example was trying to round up the cows this morning which he thought got out of their fence.  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  This has slowly worsened over months.  Currently having his dementia medications adjusted.     Physical Exam Updated Vital Signs BP (!) 141/61 (BP Location: Left Arm)   Pulse 61   Temp 98.2 F (36.8 C) (Oral) Comment: took Tylenol at 1500  Resp 20   SpO2 97%   Physical Exam Vitals signs and nursing note reviewed.  Constitutional:      General: He is not in acute distress.    Appearance: He is well-developed. He is not ill-appearing.     Comments: Defers to wife for answering most questions.  HENT:     Head: Normocephalic and atraumatic.  Eyes:      Conjunctiva/sclera: Conjunctivae normal.  Neck:     Musculoskeletal: Normal range of motion.  Cardiovascular:     Rate and Rhythm: Normal rate and regular rhythm.     Heart sounds: Normal heart sounds.  Pulmonary:     Effort: Pulmonary effort is normal. No respiratory distress.     Breath sounds: No stridor. Rhonchi present. No wheezing.     Comments: Decreased breath sounds throughout.  Frequent cough exacerbated by deep inspiration.  Cough is actively productive of yellow sputum. Abdominal:     General: Bowel sounds are normal. There is no distension.     Palpations: Abdomen is soft.     Tenderness: There is no abdominal tenderness.  Musculoskeletal: Normal range of motion.  Skin:    General: Skin is warm and dry.     Coloration: Skin is not pale.  Neurological:     Mental Status: He is alert.      ED Treatments / Results  Labs (all labs ordered are listed, but only abnormal results are displayed) Labs Reviewed  CBC WITH DIFFERENTIAL/PLATELET - Abnormal; Notable for the following components:      Result Value   RBC 3.80 (*)    Hemoglobin 10.8 (*)    HCT 34.3 (*)    All other components within normal limits  BASIC METABOLIC PANEL - Abnormal; Notable for the following components:   Glucose, Bld 102 (*)    All other components within normal limits  SARS CORONAVIRUS 2 (HOSPITAL ORDER, [place] [facility])  LACTIC ACID, PLASMA  URINALYSIS, ROUTINE W REFLEX MICROSCOPIC    EKG EKG Interpretation  Date/Time:  Wednesday May 05 2019 17:31:43 EDT Ventricular Rate:  50 PR Interval:    QRS Duration: 92 QT Interval:  438 QTC Calculation: 400 R Axis:   35 Text Interpretation:  Sinus rhythm No STEMI  Confirmed by Nanda Quinton [phone]) on 05/05/2019 5:37:24 PM   Radiology Dg Chest Portable 1 View  Result Date: 05/05/2019 CLINICAL DATA:  Fever to 103 degrees, cough for couple weeks worse in past 2 days, suspected pneumonia, history COPD, dementia, atrial  fibrillation EXAM: PORTABLE CHEST 1 VIEW COMPARISON:  Portable exam 1703 hours compared to 10/02/2018 FINDINGS: Normal heart size, mediastinal contours, and pulmonary vascularity. Lungs clear. No infiltrate, pleural effusion or pneumothorax. Previously identified RIGHT middle lobe infiltrate resolved. No acute osseous findings. IMPRESSION: No acute abnormalities. Electronically Signed   By: Lavonia Dana M.D.   On: 05/05/2019 17:22    Procedures Procedures (including critical care time)  Medications Ordered in ED Medications - No data to display   Initial Impression / Assessment and Plan / ED Course  I have reviewed the triage vital signs and the nursing notes.  Pertinent labs & imaging results that were available during my care of the patient were reviewed by me and considered in my medical decision making (see chart for details).        Pt with normal exam and work up.  Afebrile here, ambulated without sob or hypoxia. No wheeze on exam, no resp distress while here.  Blood cx pending, cxr clear.  UA negative. ? Erroneous temp reading at pulmonology office today?? Temp checked in parking lot prior to entering building.  No fevers here, feels fine. Prn f/u planned, return/recheck with pcp sx discussed.    Final Clinical Impressions(s) / ED Diagnoses   Final diagnoses:  History of fever  Chronic obstructive pulmonary disease, unspecified COPD type ([place])  ED Discharge Orders    None       Landis Martins 05/05/19 2039    Margette Fast, MD 05/06/19 [phone]

## 2019-05-05 NOTE — ED Notes (Signed)
Patient ambulated in hallway. Patient's 02 sat 94-100 percent. Patient states that he does not feel short of breath. Patient is a little unsteady gait, states that he uses a cane at home.

## 2019-05-05 NOTE — ED Triage Notes (Signed)
Patient sent by Pulmonology for fever of 103, suspected pneumonia. Patient has hx of COPD. Coughing for the last couple of weeks, worse for the past 2 days.

## 2019-05-11 ENCOUNTER — Other Ambulatory Visit: Payer: Self-pay | Admitting: Neurology

## 2019-05-20 ENCOUNTER — Ambulatory Visit: Payer: Medicare Other | Admitting: Neurology

## 2019-05-26 ENCOUNTER — Other Ambulatory Visit: Payer: Self-pay

## 2019-05-26 ENCOUNTER — Ambulatory Visit (INDEPENDENT_AMBULATORY_CARE_PROVIDER_SITE_OTHER): Payer: Medicare Other | Admitting: Neurology

## 2019-05-26 ENCOUNTER — Telehealth: Payer: Self-pay | Admitting: Neurology

## 2019-05-26 ENCOUNTER — Encounter: Payer: Self-pay | Admitting: Neurology

## 2019-05-26 VITALS — BP 119/67 | HR 51 | Temp 97.7°F | Wt 197.0 lb

## 2019-05-26 DIAGNOSIS — F0391 Unspecified dementia with behavioral disturbance: Secondary | ICD-10-CM | POA: Diagnosis not present

## 2019-05-26 DIAGNOSIS — R634 Abnormal weight loss: Secondary | ICD-10-CM | POA: Diagnosis not present

## 2019-05-26 DIAGNOSIS — R4189 Other symptoms and signs involving cognitive functions and awareness: Secondary | ICD-10-CM

## 2019-05-26 MED ORDER — QUETIAPINE FUMARATE ER 150 MG PO TB24: 150.0000 mg | ORAL_TABLET | Freq: Every day | ORAL | 6 refills | Status: AC

## 2019-05-26 MED ORDER — SERTRALINE HCL 100 MG PO TABS: 200.0000 mg | ORAL_TABLET | Freq: Every day | ORAL | 4 refills | Status: AC

## 2019-05-26 NOTE — Patient Instructions (Addendum)
- Increase the Seroquel XR to 150mg . We can continue to increase to 200 then 300 and even 400mg  If this doesn't work we will change agents but I think if we find the right dosage he will be better. - Continue Zoloft 200mg . - Get help, don't burn out! [facility] has some great options and may consider a memory unit I the future. - His memory score is 7/30 From 27/30 in 07/2017 - next appointment we can talk by phone and you can email me to increase th Seroquel further.   Dementia Caregiver Guide Dementia is a term used to describe a number of symptoms that affect memory and thinking. The most common symptoms include:  Memory loss.  Trouble with language and communication.  Trouble concentrating.  Poor judgment.  Problems with reasoning.  Child-like behavior and language.  Extreme anxiety.  Angry outbursts.  Wandering from home or public places. Dementia usually gets worse slowly over time. In the early stages, people with dementia can stay independent and safe with some help. In later stages, they need help with daily tasks such as dressing, grooming, and using the bathroom. How to help the person with dementia cope Dementia can be frightening and confusing. Here are some tips to help the person with dementia cope with changes caused by the disease. General tips  Keep the person on track with his or her routine.  Try to identify areas where the person may need help.  Be supportive, patient, calm, and encouraging.  Gently remind the person that adjusting to changes takes time.  Help with the tasks that the person has asked for help with.  Keep the person involved in daily tasks and decisions as much as possible.  Encourage conversation, but try not to get frustrated or harried if the person struggles to find words or does not seem to appreciate your help. Communication tips  When the person is talking or seems frustrated, make eye contact and hold the person's  hand.  Ask specific questions that need yes or no answers.  Use simple words, short sentences, and a calm voice. Only give one direction at a time.  When offering choices, limit them to just 1 or 2.  Avoid correcting the person in a negative way.  If the person is struggling to find the right words, gently try to help him or her. How to recognize symptoms of stress Symptoms of stress in caregivers include:  Feeling frustrated or angry with the person with dementia.  Denying that the person has dementia or that his or her symptoms will not improve.  Feeling hopeless and unappreciated.  Difficulty sleeping.  Difficulty concentrating.  Feeling anxious, irritable, or depressed.  Developing stress-related health problems.  Feeling like you have too little time for your own life. Follow these instructions at home:   Make sure that you and the person you are caring for: ? Get regular sleep. ? Exercise regularly. ? Eat regular, nutritious meals. ? Drink enough fluid to keep your urine clear or pale yellow. ? Take over-the-counter and prescription medicines only as told by your health care providers. ? Attend all scheduled health care appointments.  Join a support group with others who are caregivers.  Ask about respite care resources so that you can have a regular break from the stress of caregiving.  Look for signs of stress in yourself and in the person you are caring for. If you notice signs of stress, take steps to manage it.  Consider any  safety risks and take steps to avoid them.  Organize medications in a pill box for each day of the week.  Create a plan to handle any legal or financial matters. Get legal or financial advice if needed.  Keep a calendar in a central location to remind the person of appointments or other activities. Tips for reducing the risk of injury  Keep floors clear of clutter. Remove rugs, magazine racks, and floor lamps.  Keep hallways  well lit, especially at night.  Put a handrail and nonslip mat in the bathtub or shower.  Put childproof locks on cabinets that contain dangerous items, such as medicines, alcohol, guns, toxic cleaning items, [place] tools or utensils, matches, and lighters.  Put the locks in places where the person cannot see or reach them easily. This will help ensure that the person does not wander out of the house and get lost.  Be prepared for emergencies. Keep a list of emergency phone numbers and addresses in a convenient area.  Remove car keys and lock garage doors so that the person does not try to get in the car and drive.  Have the person wear a bracelet that tracks locations and identifies the person as having memory problems. This should be worn at all times for safety. Where to find support: Many individuals and organizations offer support. These include:  Support groups for people with dementia and for caregivers.  Counselors or therapists.  Home health care services.  Adult day care centers. Where to find more information Alzheimer's [employer]: [employer] Contact a health care provider if:  The person's health is rapidly getting worse.  You are no longer able to care for the person.  Caring for the person is affecting your physical and emotional health.  The person threatens himself or herself, you, or anyone else. Summary  Dementia is a term used to describe a number of symptoms that affect memory and thinking.  Dementia usually gets worse slowly over time.  Take steps to reduce the person's risk of injury, and to plan for future care.  Caregivers need support, relief from caregiving, and time for their own lives. This information is not intended to replace advice given to you by your health care provider. Make sure you discuss any questions you have with your health care provider. Document Released: 09/03/2016 Document Revised: 09/12/2017 Document Reviewed: 09/03/2016  Elsevier Patient Education  2020 [employer].
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
## 2019-05-26 NOTE — Telephone Encounter (Signed)
PA submitted on cover my meds/[employer] for the seroquel ER 150 mg medication.  Was immediately approved for the patient "Approved, Coverage Starts on: 10/14/2018 12:00:00 AM, Coverage Ends on: 10/14/2019 12:00:00 AM. "

## 2019-06-06 NOTE — Progress Notes (Signed)
Office Visit    Patient Name: Juan Johnson Date of Encounter: 06/07/2019  Primary Care Provider:  Curly Rim, MD Primary Cardiologist:  P. Martinique, MD   Chief Complaint    68 y/o ? with a h/o PAFlutter, HTN, HL, COPD, and prior CVA, who presents for f/u.  History of Present Illness    68 year old male with a history of paroxysmal atrial fibrillation/flutter, hypertension, COPD, prior stroke, sleep apnea, anxiety, depression, Mild to moderate mitral regurgitation, and hyperlipidemia.  He was seen twice in 2017 related to pneumonia and hemoptysis. In that setting, he also developed recurrent A. fib flutter. When he was seen back a week later, he was back in sinus rhythm.  His beta blocker was reduced due to bradycardia.  His last Echo in February 2018 showed only mild MR and normal LV function.   He underwent L5/S1 laminectomy in early May 2018. He was readmitted one week later with HCAP. He had associated AFib with controlled rate. Treated with antibiotics. Had persistent cough for > one month. CT negative.   In December 2018 he was diagnosed with iron deficiency anemia. Hgb returned to normal on iron therapy. He did undergo upper and lower EGD on 01/22/18. This was remarkable for multiple colon polyps, diverticulosis, and moderate antral gastritis.   In July 2019 he was seen in the ED and treated for LLL PNA.   Admitted in Dec 2019 with the flu, PNA and Afib with RVR. Managed conservatively.  On follow up today he is feeling well. He is seen with his wife. He has dementia and visual problems. He complains that his family doesn't let him do anything anymore. He denies any palpitations, dizziness, chest pain or SOB.   Past Medical History    Past Medical History:  Diagnosis Date  . Anemia   . Anxiety    claustrophobia  . Arthritis   . Complication of anesthesia    difficult to wake up after neck surgery  . COPD (chronic obstructive pulmonary disease) ([place])   . Dementia  ([place])   . Depression   . Enlarged liver    20-25 years ago  . GERD (gastroesophageal reflux disease)   . Glaucoma   . Hemoptysis    a. 03/2016 in setting of PNA-->resolved.  . Hypertensive heart disease   . Mitral regurgitation    a. 09/2015 Echo: EF 55-60%, no rwma, mild to mod MR, PASP 7mmHg.  . Paroxysmal atrial flutter ([place])    a. Noted 09/2015 following back surgery-->Eliquis (CHA2DS2VASc = 4);  b. 03/2016 Recurrent PAFlutter in setting of PNA-->converted spont.  . 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 Pneumonia 04/2015   "bronchial" pneumonia   . Retinal tear of left eye   . Sleep apnea    does use cpap  . Stroke [facility]) ? 05/2014   peripheral vision loss   Past Surgical History:  Procedure Laterality Date  . BACK SURGERY  09/27/2015   [place]  . CARDIAC CATHETERIZATION  2003  . COLONOSCOPY    . COLONOSCOPY WITH PROPOFOL N/A 01/22/2018   Procedure: COLONOSCOPY WITH PROPOFOL;  Surgeon: Juanita Craver, MD;  Location: [facility] ENDOSCOPY;  Service: Endoscopy;  Laterality: N/A;  . ESOPHAGOGASTRODUODENOSCOPY (EGD) WITH PROPOFOL N/A 01/22/2018   Procedure: ESOPHAGOGASTRODUODENOSCOPY (EGD) WITH PROPOFOL;  Surgeon: Juanita Craver, MD;  Location: [facility] ENDOSCOPY;  Service: Endoscopy;  Laterality: N/A;  . EYE SURGERY  10/2007  . EYE SURGERY Bilateral 2018  Eyelid  . LEG SURGERY     AT AGE 77  . LUMBAR FUSION  09/27/2015  . NECK SURGERY  06/29/2010  . SHOULDER SURGERY  09/24/2011  . TONSILLECTOMY     AT AGE 60  . VASECTOMY      Allergies  Allergies  Allergen Reactions  . Ace Inhibitors Cough  . Crestor [Rosuvastatin] Other (See Comments)    MYALGIAS CRAMPS MUSCLE WEAKNESS  . Simvastatin Other (See Comments)    MYALGIAS CRAMPS MUSCLE WEAKNESS  . Statins     MYALGIAS CRAMPS MUSCLE WEAKNESS     Home Medications    Prior to Admission medications   Medication Sig Start Date End Date Taking? Authorizing Provider  acetaminophen (TYLENOL) 500 MG tablet Take 500  mg by mouth every 4 (four) hours as needed for mild pain or moderate pain (Takes 2 every 4- 6 hours).    Yes Historical Provider, MD  albuterol (PROVENTIL HFA;VENTOLIN HFA) 108 (90 Base) MCG/ACT inhaler Inhale into the lungs every 6 (six) hours as needed for wheezing or shortness of breath.   Yes Historical Provider, MD  ALPRAZolam (XANAX) 0.5 MG tablet Take 1-2 30 minutes before MRi do not drive. May repeat if needed. 02/15/16  Yes Melvenia Beam, MD  apixaban (ELIQUIS) 5 MG TABS tablet Take 1 tablet (5 mg total) by mouth 2 (two) times daily. 01/11/16  Yes Peter M Martinique, MD  BREO ELLIPTA 100-25 MCG/INH AEPB Take 1 puff by mouth daily. 02/13/16  Yes Historical Provider, MD  Cetirizine HCl (ZYRTEC PO) Take 10 mg by mouth daily.    Yes Historical Provider, MD  dextromethorphan-guaiFENesin (MUCINEX DM) 30-600 MG 12hr tablet Take 1 tablet by mouth 2 (two) times daily as needed for cough.    Yes Historical Provider, MD  diltiazem (CARDIZEM CD) 240 MG 24 hr capsule Take 1 capsule (240 mg total) by mouth daily. 01/30/16  Yes Peter M Martinique, MD  donepezil (ARICEPT) 10 MG tablet Take 0.5 tablets by mouth daily. 05/29/16  Yes Historical Provider, MD  ezetimibe (ZETIA) 10 MG tablet Take 10 mg by mouth daily.   Yes Historical Provider, MD  ketorolac (ACULAR) 0.5 % ophthalmic solution Place 1 drop into the left eye 4 (four) times daily.  08/22/14  Yes Historical Provider, MD  latanoprost (XALATAN) 0.005 % ophthalmic solution Place 1 drop into both eyes at bedtime.    Yes Historical Provider, MD  metoprolol tartrate (LOPRESSOR) 25 MG tablet Take 0.5 tablets (12.5 mg total) by mouth 2 (two) times daily. 02/15/16  Yes Peter M Martinique, MD  montelukast (SINGULAIR) 10 MG tablet Take 10 mg by mouth at bedtime.  03/09/15  Yes Historical Provider, MD  Multiple Vitamins-Minerals (MULTIVITAMIN WITH MINERALS) tablet Take 1 tablet by mouth daily.   Yes Historical Provider, MD  niacin (NIASPAN) 1000 MG CR tablet Take 1,000 mg by mouth  daily. 03/09/15  Yes Historical Provider, MD  omeprazole (PRILOSEC) 20 MG capsule Take 20 mg by mouth 2 (two) times daily before a meal.  03/09/15  Yes Historical Provider, MD  sertraline (ZOLOFT) 50 MG tablet Take 1 tablet (50 mg total) by mouth daily. 06/03/16  Yes Melvenia Beam, MD  valsartan-hydrochlorothiazide (DIOVAN-HCT) 160-12.5 MG tablet Take 1 tablet by mouth daily. 01/29/16  Yes Peter M Martinique, MD    Review of Systems    As noted above.   All other systems reviewed and are otherwise negative except as noted above.  Physical Exam    VS:  BP Marland Kitchen)  102/58   Pulse (!) 41   Temp (!) 96.8 F (36 C)   Ht 5\' 9"  (1.753 m)   Wt 195 lb (88.5 kg)   SpO2 98%   BMI 28.80 kg/m  , BMI Body mass index is 28.8 kg/m. GENERAL:  Well appearing, obese WM in NAD HEENT:  PERRL, EOMI, sclera are clear. Oropharynx is clear. NECK:  No jugular venous distention, carotid upstroke brisk and symmetric, no bruits, no thyromegaly or adenopathy LUNGS:  Clear to auscultation bilaterally CHEST:  Unremarkable HEART:  RRR,  PMI not displaced or sustained,S1 and S2 within normal limits, no S3, no S4: no clicks, no rubs, no murmurs ABD:  Soft, nontender. BS +, no masses or bruits. No hepatomegaly, no splenomegaly EXT:  2 + pulses throughout, no edema, no cyanosis no clubbing SKIN:  Warm and dry.  No rashes NEURO:  Alert and oriented x 3. Cranial nerves II through XII intact. PSYCH:  Cognitively intact    Accessory Clinical Findings    Lab Results  Component Value Date   WBC 9.7 05/05/2019   HGB 10.8 (L) 05/05/2019   HCT 34.3 (L) 05/05/2019   PLT 311 05/05/2019   GLUCOSE 102 (H) 05/05/2019   CHOL  05/27/2010    172        ATP III CLASSIFICATION:  <200     mg/dL   Desirable  200-239  mg/dL   Borderline High  >=240    mg/dL   High          TRIG 206 (H) 05/27/2010   HDL 34 (L) 05/27/2010   LDLCALC  05/27/2010    97        Total Cholesterol/HDL:CHD Risk Coronary Heart Disease Risk Table                      Men   Women  1/2 Average Risk   3.4   3.3  Average Risk       5.0   4.4  2 X Average Risk   9.6   7.1  3 X Average Risk  23.4   11.0        Use the calculated Patient Ratio above and the CHD Risk Table to determine the patient's CHD Risk.        ATP III CLASSIFICATION (LDL):  <100     mg/dL   Optimal  100-129  mg/dL   Near or Above                    Optimal  130-159  mg/dL   Borderline  160-189  mg/dL   High  >190     mg/dL   Very High   ALT 19 04/20/2018   AST 22 04/20/2018   NA 139 05/05/2019   K 3.8 05/05/2019   CL 109 05/05/2019   CREATININE 0.91 05/05/2019   BUN 15 05/05/2019   CO2 22 05/05/2019   TSH 0.651 10/03/2015   INR 1.18 02/16/2017   HGBA1C 6.0 (H) 06/24/2014   Labs dated 09/12/16: glucose 103, UA normal. Hgb 12.0. Normal CMET. Hep C antibody neg. Cholesterol 200, triglycerides 131, HDL 42, LDL 132.  Dated 09/15/17: cholesterol 201, triglycerides 87, HDL 42, LDL 142.  Dated 12/18/17: normal CMET and CBC.  Echo 12/05/16:Study Conclusions  - Left ventricle: The cavity size was mildly dilated. Wall   thickness was normal. Systolic function was normal. The estimated   ejection fraction was in the range of 50%  to 55%. Wall motion was   normal; there were no regional wall motion abnormalities. Left   ventricular diastolic function parameters were normal. - Mitral valve: There was mild regurgitation. - Left atrium: The atrium was mildly dilated. - Pulmonary arteries: Systolic pressure was mildly increased. PA   peak pressure: 32 mm Hg (S).  Impressions:  - Normal LV systolic and diastolic function; mild LVE; mild MR;   mild LAE; trace TR; mildly elevated pulmonary pressure.   Assessment & Plan    1.  Paroxysmal atrial fibrillation/flutter: often triggered by acute respiratory illnesses.   He remains in sinus rhythm. Will continue rate control strategy with  Cardizem. Beta blocker discontinued previously due to bradycardia.  Continue Eliquis for  anticoagulation.  2. Hypertensive heart disease: Blood pressure is well controlled.  3. Hyperlipidemia: He is treated with Zetia and niacin.  Lipids have been managed by primary care. Intolerant of statins.  4. Mild mitral regurgitation by Echo. Asymptomatic.  5. Disposition: follow up in 6 months  Peter Martinique, [place] 06/07/2019, 8:30 AM

## 2019-06-07 ENCOUNTER — Ambulatory Visit (INDEPENDENT_AMBULATORY_CARE_PROVIDER_SITE_OTHER): Payer: Medicare Other | Admitting: Cardiology

## 2019-06-07 ENCOUNTER — Other Ambulatory Visit: Payer: Self-pay

## 2019-06-07 ENCOUNTER — Encounter: Payer: Self-pay | Admitting: Cardiology

## 2019-06-07 VITALS — BP 102/58 | HR 41 | Temp 96.8°F | Ht 69.0 in | Wt 195.0 lb

## 2019-06-07 DIAGNOSIS — E7800 Pure hypercholesterolemia, unspecified: Secondary | ICD-10-CM

## 2019-06-07 DIAGNOSIS — I4892 Unspecified atrial flutter: Secondary | ICD-10-CM

## 2019-06-07 DIAGNOSIS — I10 Essential (primary) hypertension: Secondary | ICD-10-CM | POA: Diagnosis not present

## 2019-06-21 ENCOUNTER — Other Ambulatory Visit: Payer: Self-pay | Admitting: Neurology

## 2019-06-21 MED ORDER — RISPERIDONE 1 MG PO TABS: 1.0000 mg | ORAL_TABLET | Freq: Every day | ORAL | 6 refills | Status: DC

## 2019-06-30 ENCOUNTER — Other Ambulatory Visit: Payer: Self-pay | Admitting: Neurology

## 2019-06-30 MED ORDER — RISPERIDONE 1 MG PO TABS: 1.0000 mg | ORAL_TABLET | Freq: Two times a day (BID) | ORAL | 6 refills | Status: AC

## 2019-07-09 ENCOUNTER — Other Ambulatory Visit: Payer: Self-pay | Admitting: Cardiology

## 2019-07-22 ENCOUNTER — Other Ambulatory Visit: Payer: Self-pay | Admitting: Cardiology

## 2019-07-22 NOTE — Telephone Encounter (Signed)
Refill request

## 2019-07-23 NOTE — Telephone Encounter (Signed)
Age 68, weight 89kg, SCr 0.91 in July 2020, last OV August 2020, afib indication

## 2019-07-23 NOTE — Telephone Encounter (Signed)
Refill request

## 2019-08-15 DEATH — deceased

## 2019-09-07 ENCOUNTER — Telehealth: Payer: Self-pay | Admitting: *Deleted

## 2019-09-07 NOTE — Telephone Encounter (Signed)
Pt's wife notified us this month through mychart that pt passed away at home on hospice on Thurs 10/29. I sent a sympathy card to her.
